# Patient Record
Sex: Female | Born: 1960 | Hispanic: No | Marital: Single | Smoking: Never smoker
Health system: Southern US, Community
[De-identification: ages and names within clinical notes are randomized; demographics above are authoritative.]

## PROBLEM LIST (undated history)

## (undated) DIAGNOSIS — C801 Malignant (primary) neoplasm, unspecified: Secondary | ICD-10-CM

## (undated) DIAGNOSIS — L719 Rosacea, unspecified: Secondary | ICD-10-CM

## (undated) DIAGNOSIS — I499 Cardiac arrhythmia, unspecified: Secondary | ICD-10-CM

## (undated) DIAGNOSIS — E079 Disorder of thyroid, unspecified: Secondary | ICD-10-CM

## (undated) DIAGNOSIS — Z8619 Personal history of other infectious and parasitic diseases: Secondary | ICD-10-CM

## (undated) DIAGNOSIS — F32A Depression, unspecified: Secondary | ICD-10-CM

## (undated) DIAGNOSIS — I10 Essential (primary) hypertension: Secondary | ICD-10-CM

## (undated) DIAGNOSIS — R011 Cardiac murmur, unspecified: Secondary | ICD-10-CM

## (undated) DIAGNOSIS — E7800 Pure hypercholesterolemia, unspecified: Secondary | ICD-10-CM

## (undated) DIAGNOSIS — F209 Schizophrenia, unspecified: Secondary | ICD-10-CM

## (undated) DIAGNOSIS — M1990 Unspecified osteoarthritis, unspecified site: Secondary | ICD-10-CM

## (undated) DIAGNOSIS — IMO0002 Reserved for concepts with insufficient information to code with codable children: Secondary | ICD-10-CM

## (undated) DIAGNOSIS — T7840XA Allergy, unspecified, initial encounter: Secondary | ICD-10-CM

## (undated) DIAGNOSIS — R002 Palpitations: Secondary | ICD-10-CM

## (undated) DIAGNOSIS — Z87898 Personal history of other specified conditions: Secondary | ICD-10-CM

## (undated) DIAGNOSIS — F419 Anxiety disorder, unspecified: Secondary | ICD-10-CM

## (undated) DIAGNOSIS — K219 Gastro-esophageal reflux disease without esophagitis: Secondary | ICD-10-CM

## (undated) HISTORY — DX: Schizophrenia, unspecified: F20.9

## (undated) HISTORY — DX: Cardiac arrhythmia, unspecified: I49.9

## (undated) HISTORY — DX: Disorder of thyroid, unspecified: E07.9

## (undated) HISTORY — DX: Unspecified osteoarthritis, unspecified site: M19.90

## (undated) HISTORY — DX: Personal history of other infectious and parasitic diseases: Z86.19

## (undated) HISTORY — DX: Rosacea, unspecified: L71.9

## (undated) HISTORY — DX: Palpitations: R00.2

## (undated) HISTORY — DX: Cardiac murmur, unspecified: R01.1

## (undated) HISTORY — PX: THYROIDECTOMY: SHX17

## (undated) HISTORY — DX: Allergy, unspecified, initial encounter: T78.40XA

## (undated) HISTORY — DX: Reserved for concepts with insufficient information to code with codable children: IMO0002

## (undated) HISTORY — DX: Malignant (primary) neoplasm, unspecified: C80.1

## (undated) HISTORY — DX: Pure hypercholesterolemia, unspecified: E78.00

## (undated) HISTORY — DX: Personal history of other specified conditions: Z87.898

## (undated) HISTORY — DX: Essential (primary) hypertension: I10

## (undated) HISTORY — DX: Depression, unspecified: F32.A

## (undated) HISTORY — DX: Gastro-esophageal reflux disease without esophagitis: K21.9

## (undated) HISTORY — PX: FOOT SURGERY: SHX648

## (undated) HISTORY — PX: KNEE SURGERY: SHX244

## (undated) HISTORY — DX: Anxiety disorder, unspecified: F41.9

## (undated) HISTORY — PX: BRAIN SURGERY: SHX531

---

## 2000-01-03 ENCOUNTER — Other Ambulatory Visit: Admission: RE | Admit: 2000-01-03 | Discharge: 2000-01-03 | Payer: Self-pay | Admitting: *Deleted

## 2001-01-09 ENCOUNTER — Other Ambulatory Visit: Admission: RE | Admit: 2001-01-09 | Discharge: 2001-01-09 | Payer: Self-pay | Admitting: *Deleted

## 2002-04-24 ENCOUNTER — Other Ambulatory Visit: Admission: RE | Admit: 2002-04-24 | Discharge: 2002-04-24 | Payer: Self-pay | Admitting: *Deleted

## 2002-04-26 ENCOUNTER — Emergency Department (HOSPITAL_COMMUNITY): Admission: EM | Admit: 2002-04-26 | Discharge: 2002-04-26 | Payer: Self-pay | Admitting: *Deleted

## 2002-04-26 ENCOUNTER — Encounter: Payer: Self-pay | Admitting: Emergency Medicine

## 2002-04-26 IMAGING — CT CT HEAD W/O CM
1 of 2 series · 13 of 30 positions shown, 17 images · non-contrast
Comparison: none

FINDINGS
CLINICAL DATA: FALL STRIKING BACK OF HEAD.   PAIN AND NAUSEA.
CT HEAD WITHOUT CONTRAST
ROUTINE NONCONTRAST CT OF THE HEAD WITHOUT PRIORS FOR COMPARISON.

[Series 2: — · axial · 0.43mm/px · z∈[+1011,+1131]mm · 13 of 28 slices shown, 17 images]
[im 2/28  brain]
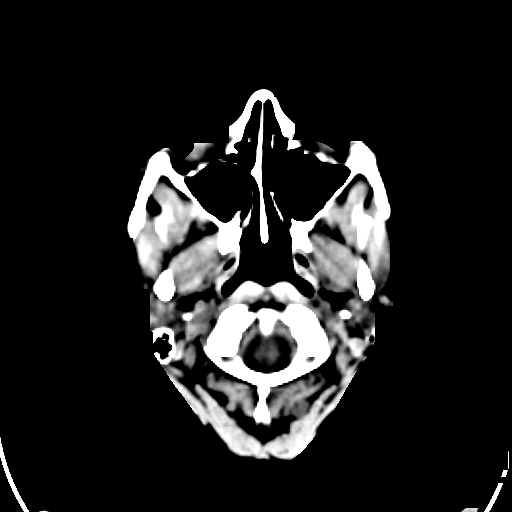
[im 2/28  bone]
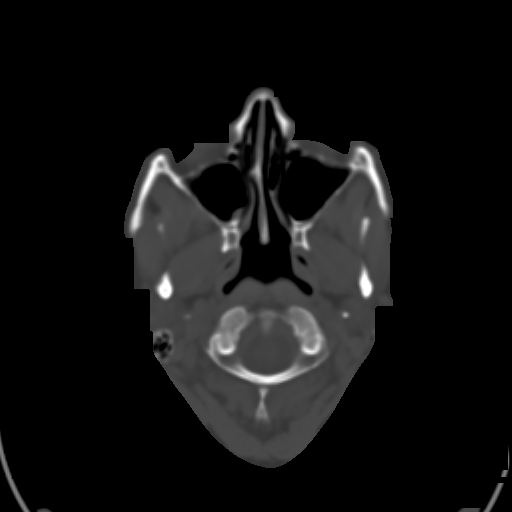
[im 4/28  brain]
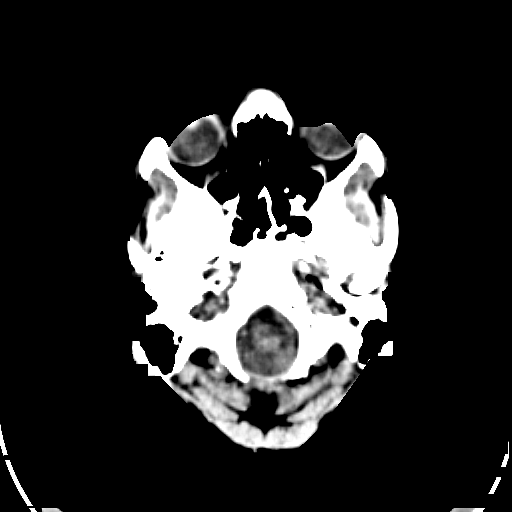
[im 6/28  brain]
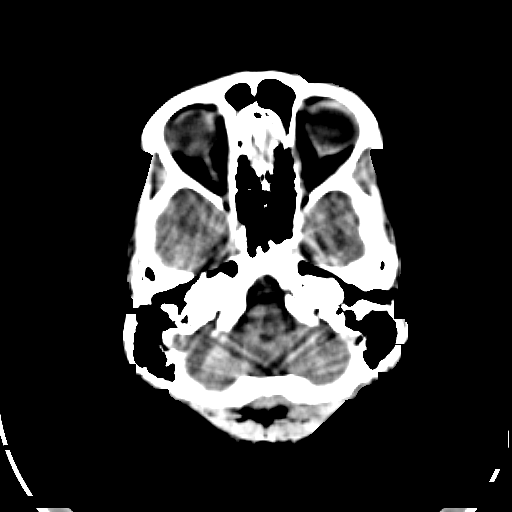
[im 8/28  brain]
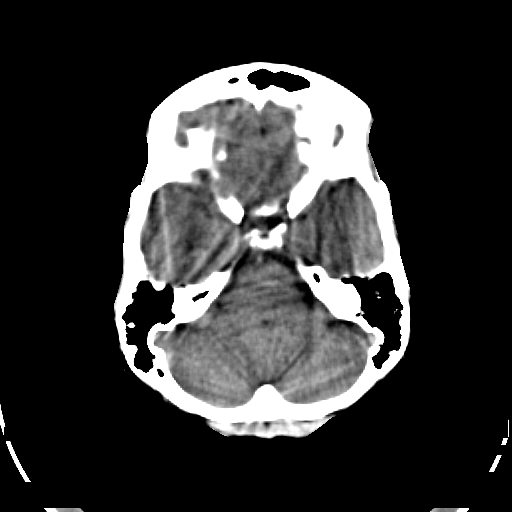
[im 10/28  brain]
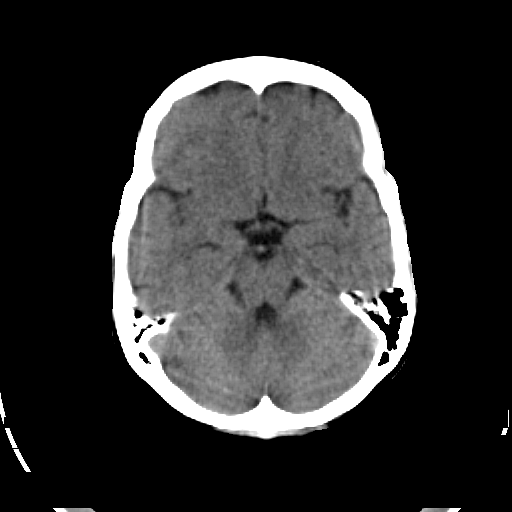
[im 10/28  bone]
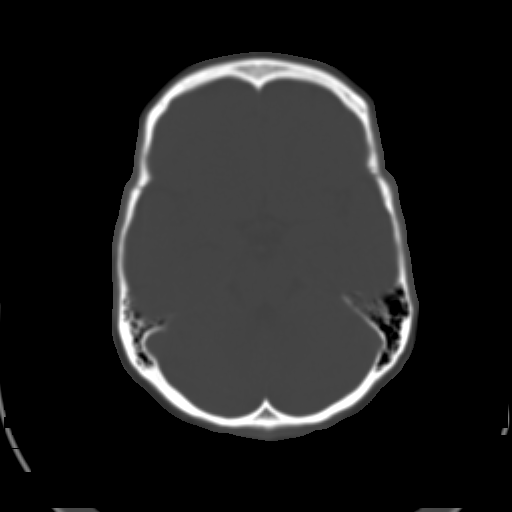
[im 12/28  brain]
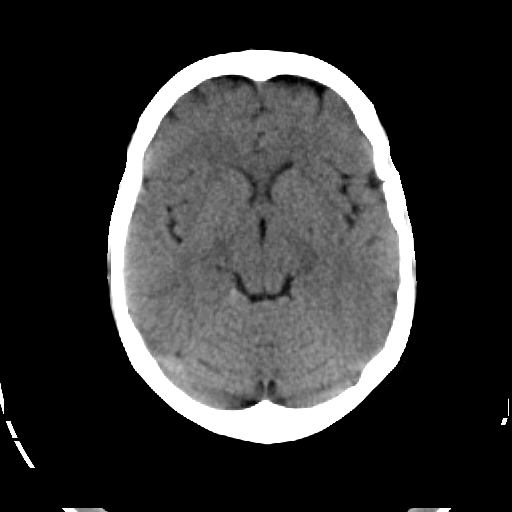
[im 14/28  brain]
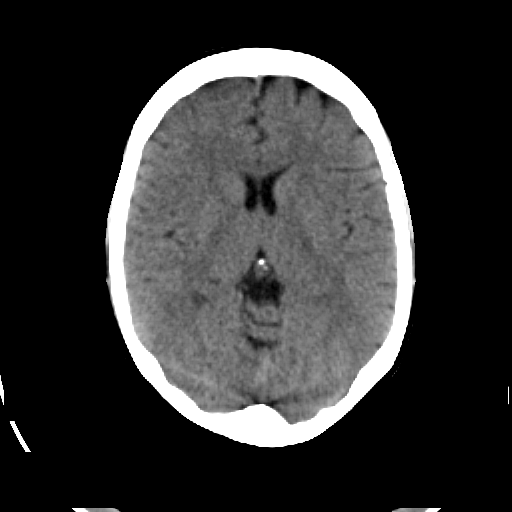
[im 16/28  brain]
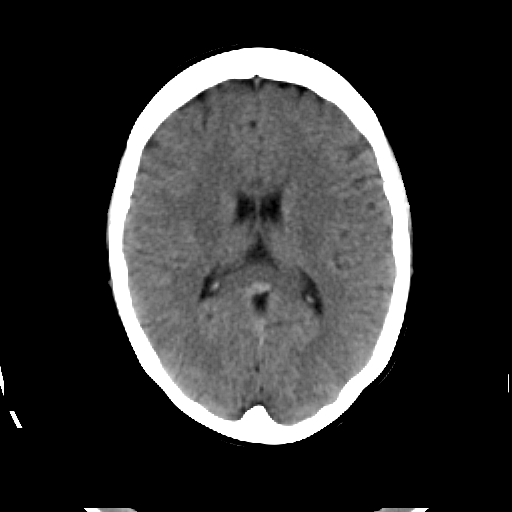
[im 18/28  brain]
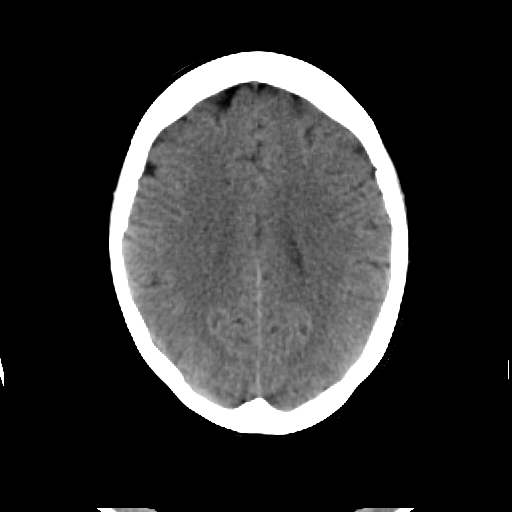
[im 18/28  bone]
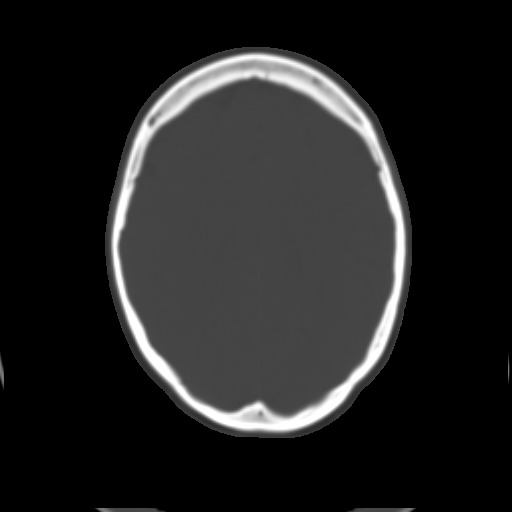
[im 20/28  brain]
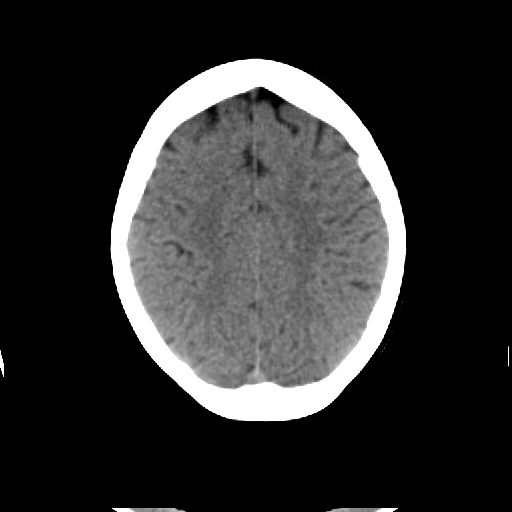
[im 22/28  brain]
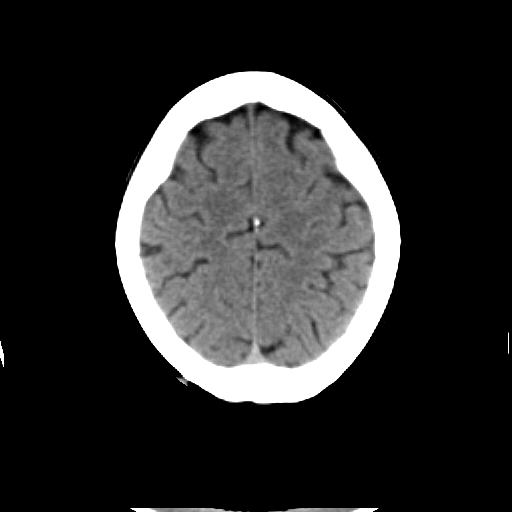
[im 24/28  brain]
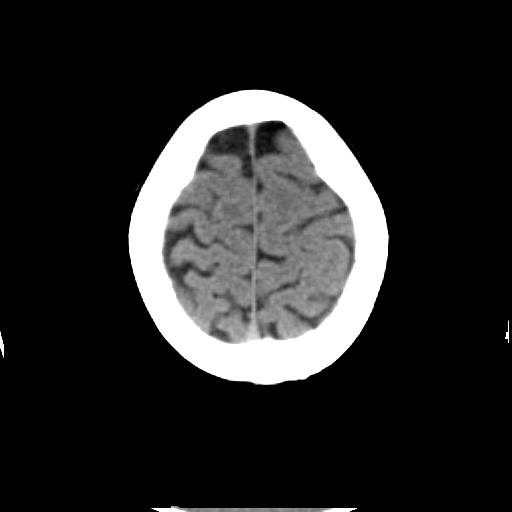
[im 26/28  brain]
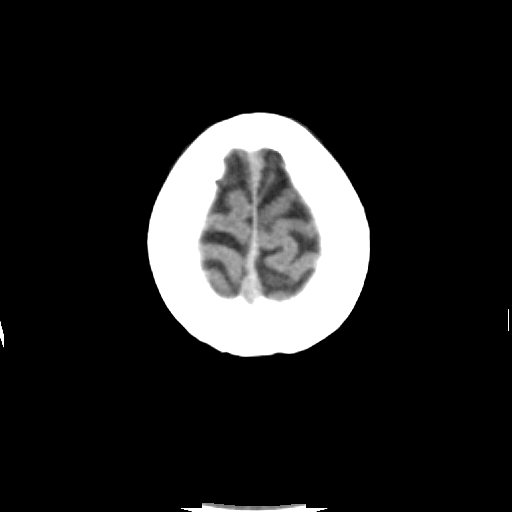
[im 26/28  bone]
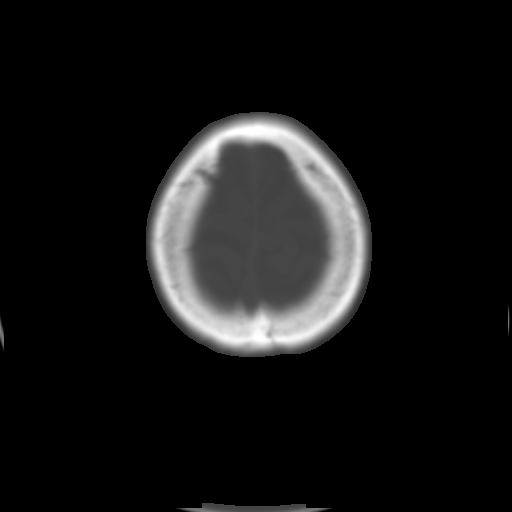

[13 of 30 positions shown; findings below may reference images not displayed]

FINDINGS: NORMAL VENTRICULAR MORPHOLOGY.  NO MIDLINE SHIFT OR MASS EFFECT.  NO INTRACRANIAL HEMORRHAGE OR
FOCAL PARENCHYMAL BRAIN ABNORMALITY SEEN.  SINUSES ARE CLEAR.  BONES ARE UNREMARKABLE.
IMPRESSION
NO EVIDENCE OF ACUTE INJURY.

## 2002-07-23 ENCOUNTER — Encounter: Payer: Self-pay | Admitting: Family Medicine

## 2002-07-23 ENCOUNTER — Encounter: Admission: RE | Admit: 2002-07-23 | Discharge: 2002-07-23 | Payer: Self-pay | Admitting: Family Medicine

## 2003-05-08 ENCOUNTER — Other Ambulatory Visit: Admission: RE | Admit: 2003-05-08 | Discharge: 2003-05-08 | Payer: Self-pay | Admitting: *Deleted

## 2005-02-19 ENCOUNTER — Emergency Department: Payer: Self-pay | Admitting: Emergency Medicine

## 2010-04-18 ENCOUNTER — Encounter: Payer: Self-pay | Admitting: Neurology

## 2011-04-11 DIAGNOSIS — R109 Unspecified abdominal pain: Secondary | ICD-10-CM | POA: Insufficient documentation

## 2011-04-11 DIAGNOSIS — L680 Hirsutism: Secondary | ICD-10-CM | POA: Insufficient documentation

## 2011-04-11 DIAGNOSIS — H9209 Otalgia, unspecified ear: Secondary | ICD-10-CM | POA: Insufficient documentation

## 2012-03-13 DIAGNOSIS — E039 Hypothyroidism, unspecified: Secondary | ICD-10-CM | POA: Insufficient documentation

## 2012-06-22 DIAGNOSIS — J069 Acute upper respiratory infection, unspecified: Secondary | ICD-10-CM | POA: Insufficient documentation

## 2012-09-26 DIAGNOSIS — Z7185 Encounter for immunization safety counseling: Secondary | ICD-10-CM | POA: Insufficient documentation

## 2012-09-26 DIAGNOSIS — IMO0001 Reserved for inherently not codable concepts without codable children: Secondary | ICD-10-CM | POA: Insufficient documentation

## 2013-04-17 DIAGNOSIS — L988 Other specified disorders of the skin and subcutaneous tissue: Secondary | ICD-10-CM | POA: Diagnosis not present

## 2013-04-17 DIAGNOSIS — E039 Hypothyroidism, unspecified: Secondary | ICD-10-CM | POA: Diagnosis not present

## 2013-04-17 DIAGNOSIS — E559 Vitamin D deficiency, unspecified: Secondary | ICD-10-CM | POA: Diagnosis not present

## 2013-04-29 DIAGNOSIS — Z8042 Family history of malignant neoplasm of prostate: Secondary | ICD-10-CM | POA: Diagnosis not present

## 2013-04-29 DIAGNOSIS — H538 Other visual disturbances: Secondary | ICD-10-CM | POA: Diagnosis not present

## 2013-04-29 DIAGNOSIS — B9789 Other viral agents as the cause of diseases classified elsewhere: Secondary | ICD-10-CM | POA: Diagnosis not present

## 2013-04-29 DIAGNOSIS — J3489 Other specified disorders of nose and nasal sinuses: Secondary | ICD-10-CM | POA: Diagnosis not present

## 2013-04-29 DIAGNOSIS — R059 Cough, unspecified: Secondary | ICD-10-CM | POA: Diagnosis not present

## 2013-04-29 DIAGNOSIS — E0789 Other specified disorders of thyroid: Secondary | ICD-10-CM | POA: Diagnosis not present

## 2013-04-29 DIAGNOSIS — Z8249 Family history of ischemic heart disease and other diseases of the circulatory system: Secondary | ICD-10-CM | POA: Diagnosis not present

## 2013-04-29 DIAGNOSIS — R110 Nausea: Secondary | ICD-10-CM | POA: Diagnosis not present

## 2013-04-29 DIAGNOSIS — R05 Cough: Secondary | ICD-10-CM | POA: Diagnosis not present

## 2013-04-29 DIAGNOSIS — Z800 Family history of malignant neoplasm of digestive organs: Secondary | ICD-10-CM | POA: Diagnosis not present

## 2013-04-29 DIAGNOSIS — T7589XA Other specified effects of external causes, initial encounter: Secondary | ICD-10-CM | POA: Diagnosis not present

## 2013-05-28 DIAGNOSIS — J3489 Other specified disorders of nose and nasal sinuses: Secondary | ICD-10-CM | POA: Diagnosis not present

## 2013-05-28 DIAGNOSIS — L680 Hirsutism: Secondary | ICD-10-CM | POA: Diagnosis not present

## 2013-05-28 DIAGNOSIS — L678 Other hair color and hair shaft abnormalities: Secondary | ICD-10-CM | POA: Insufficient documentation

## 2013-05-28 DIAGNOSIS — L670 Trichorrhexis nodosa: Secondary | ICD-10-CM | POA: Insufficient documentation

## 2013-05-28 DIAGNOSIS — R0981 Nasal congestion: Secondary | ICD-10-CM | POA: Insufficient documentation

## 2013-07-10 DIAGNOSIS — R079 Chest pain, unspecified: Secondary | ICD-10-CM | POA: Diagnosis not present

## 2013-07-10 DIAGNOSIS — C73 Malignant neoplasm of thyroid gland: Secondary | ICD-10-CM | POA: Diagnosis not present

## 2013-07-10 DIAGNOSIS — R002 Palpitations: Secondary | ICD-10-CM | POA: Insufficient documentation

## 2013-07-10 DIAGNOSIS — R0602 Shortness of breath: Secondary | ICD-10-CM | POA: Insufficient documentation

## 2013-07-10 DIAGNOSIS — I10 Essential (primary) hypertension: Secondary | ICD-10-CM | POA: Diagnosis not present

## 2013-07-18 DIAGNOSIS — R221 Localized swelling, mass and lump, neck: Secondary | ICD-10-CM | POA: Diagnosis not present

## 2013-07-18 DIAGNOSIS — C73 Malignant neoplasm of thyroid gland: Secondary | ICD-10-CM | POA: Diagnosis not present

## 2013-07-18 DIAGNOSIS — Z85850 Personal history of malignant neoplasm of thyroid: Secondary | ICD-10-CM | POA: Diagnosis not present

## 2013-07-18 DIAGNOSIS — R220 Localized swelling, mass and lump, head: Secondary | ICD-10-CM | POA: Diagnosis not present

## 2013-07-22 DIAGNOSIS — R079 Chest pain, unspecified: Secondary | ICD-10-CM | POA: Diagnosis not present

## 2013-07-30 DIAGNOSIS — M2669 Other specified disorders of temporomandibular joint: Secondary | ICD-10-CM | POA: Diagnosis not present

## 2013-07-30 DIAGNOSIS — F439 Reaction to severe stress, unspecified: Secondary | ICD-10-CM | POA: Insufficient documentation

## 2013-07-30 DIAGNOSIS — M26629 Arthralgia of temporomandibular joint, unspecified side: Secondary | ICD-10-CM | POA: Insufficient documentation

## 2013-07-30 DIAGNOSIS — Z733 Stress, not elsewhere classified: Secondary | ICD-10-CM | POA: Diagnosis not present

## 2013-08-02 DIAGNOSIS — M26609 Unspecified temporomandibular joint disorder, unspecified side: Secondary | ICD-10-CM | POA: Diagnosis not present

## 2013-08-20 DIAGNOSIS — R030 Elevated blood-pressure reading, without diagnosis of hypertension: Secondary | ICD-10-CM | POA: Diagnosis not present

## 2013-08-20 DIAGNOSIS — C73 Malignant neoplasm of thyroid gland: Secondary | ICD-10-CM | POA: Diagnosis not present

## 2013-08-20 DIAGNOSIS — R002 Palpitations: Secondary | ICD-10-CM | POA: Diagnosis not present

## 2013-08-20 DIAGNOSIS — R0602 Shortness of breath: Secondary | ICD-10-CM | POA: Diagnosis not present

## 2013-08-26 DIAGNOSIS — R197 Diarrhea, unspecified: Secondary | ICD-10-CM | POA: Diagnosis not present

## 2013-08-29 DIAGNOSIS — R110 Nausea: Secondary | ICD-10-CM | POA: Diagnosis not present

## 2013-08-29 DIAGNOSIS — R109 Unspecified abdominal pain: Secondary | ICD-10-CM | POA: Diagnosis not present

## 2013-08-29 DIAGNOSIS — F411 Generalized anxiety disorder: Secondary | ICD-10-CM | POA: Diagnosis not present

## 2013-09-10 DIAGNOSIS — F29 Unspecified psychosis not due to a substance or known physiological condition: Secondary | ICD-10-CM | POA: Diagnosis not present

## 2013-09-16 DIAGNOSIS — R21 Rash and other nonspecific skin eruption: Secondary | ICD-10-CM | POA: Diagnosis not present

## 2013-09-24 DIAGNOSIS — L719 Rosacea, unspecified: Secondary | ICD-10-CM | POA: Diagnosis not present

## 2013-09-24 DIAGNOSIS — D2330 Other benign neoplasm of skin of unspecified part of face: Secondary | ICD-10-CM | POA: Diagnosis not present

## 2013-10-07 DIAGNOSIS — L708 Other acne: Secondary | ICD-10-CM | POA: Diagnosis not present

## 2013-10-07 DIAGNOSIS — E039 Hypothyroidism, unspecified: Secondary | ICD-10-CM | POA: Diagnosis not present

## 2013-10-07 DIAGNOSIS — M25559 Pain in unspecified hip: Secondary | ICD-10-CM | POA: Diagnosis not present

## 2013-10-07 DIAGNOSIS — F411 Generalized anxiety disorder: Secondary | ICD-10-CM | POA: Diagnosis not present

## 2013-10-08 DIAGNOSIS — M25559 Pain in unspecified hip: Secondary | ICD-10-CM | POA: Diagnosis not present

## 2013-10-30 DIAGNOSIS — M79609 Pain in unspecified limb: Secondary | ICD-10-CM | POA: Diagnosis not present

## 2013-10-30 DIAGNOSIS — M79651 Pain in right thigh: Secondary | ICD-10-CM | POA: Insufficient documentation

## 2013-12-04 DIAGNOSIS — R109 Unspecified abdominal pain: Secondary | ICD-10-CM | POA: Diagnosis not present

## 2013-12-04 DIAGNOSIS — R51 Headache: Secondary | ICD-10-CM | POA: Diagnosis not present

## 2013-12-04 DIAGNOSIS — R110 Nausea: Secondary | ICD-10-CM | POA: Diagnosis not present

## 2013-12-24 DIAGNOSIS — L259 Unspecified contact dermatitis, unspecified cause: Secondary | ICD-10-CM | POA: Diagnosis not present

## 2014-01-03 DIAGNOSIS — Z79899 Other long term (current) drug therapy: Secondary | ICD-10-CM | POA: Diagnosis not present

## 2014-01-03 DIAGNOSIS — I10 Essential (primary) hypertension: Secondary | ICD-10-CM | POA: Diagnosis not present

## 2014-01-03 DIAGNOSIS — L299 Pruritus, unspecified: Secondary | ICD-10-CM | POA: Diagnosis not present

## 2014-01-03 DIAGNOSIS — E039 Hypothyroidism, unspecified: Secondary | ICD-10-CM | POA: Diagnosis not present

## 2014-01-03 DIAGNOSIS — F329 Major depressive disorder, single episode, unspecified: Secondary | ICD-10-CM | POA: Diagnosis not present

## 2014-01-03 DIAGNOSIS — F419 Anxiety disorder, unspecified: Secondary | ICD-10-CM | POA: Diagnosis not present

## 2014-01-03 DIAGNOSIS — F439 Reaction to severe stress, unspecified: Secondary | ICD-10-CM | POA: Diagnosis not present

## 2014-01-03 DIAGNOSIS — R443 Hallucinations, unspecified: Secondary | ICD-10-CM | POA: Diagnosis not present

## 2014-01-10 DIAGNOSIS — L680 Hirsutism: Secondary | ICD-10-CM | POA: Diagnosis not present

## 2014-01-28 DIAGNOSIS — H524 Presbyopia: Secondary | ICD-10-CM | POA: Diagnosis not present

## 2014-01-28 DIAGNOSIS — H5213 Myopia, bilateral: Secondary | ICD-10-CM | POA: Diagnosis not present

## 2014-01-28 DIAGNOSIS — H04123 Dry eye syndrome of bilateral lacrimal glands: Secondary | ICD-10-CM | POA: Diagnosis not present

## 2014-02-07 DIAGNOSIS — R443 Hallucinations, unspecified: Secondary | ICD-10-CM | POA: Diagnosis not present

## 2014-02-07 DIAGNOSIS — J00 Acute nasopharyngitis [common cold]: Secondary | ICD-10-CM | POA: Diagnosis not present

## 2014-02-12 DIAGNOSIS — J0191 Acute recurrent sinusitis, unspecified: Secondary | ICD-10-CM | POA: Diagnosis not present

## 2014-02-12 DIAGNOSIS — F419 Anxiety disorder, unspecified: Secondary | ICD-10-CM | POA: Diagnosis not present

## 2014-02-12 DIAGNOSIS — E039 Hypothyroidism, unspecified: Secondary | ICD-10-CM | POA: Diagnosis not present

## 2014-02-12 DIAGNOSIS — F329 Major depressive disorder, single episode, unspecified: Secondary | ICD-10-CM | POA: Diagnosis not present

## 2014-02-12 DIAGNOSIS — J40 Bronchitis, not specified as acute or chronic: Secondary | ICD-10-CM | POA: Diagnosis not present

## 2014-02-12 DIAGNOSIS — Z85850 Personal history of malignant neoplasm of thyroid: Secondary | ICD-10-CM | POA: Diagnosis not present

## 2014-02-18 DIAGNOSIS — J0100 Acute maxillary sinusitis, unspecified: Secondary | ICD-10-CM | POA: Diagnosis not present

## 2014-03-05 DIAGNOSIS — R42 Dizziness and giddiness: Secondary | ICD-10-CM | POA: Diagnosis not present

## 2014-03-05 DIAGNOSIS — R51 Headache: Secondary | ICD-10-CM | POA: Diagnosis not present

## 2014-03-22 DIAGNOSIS — I10 Essential (primary) hypertension: Secondary | ICD-10-CM | POA: Diagnosis not present

## 2014-04-03 DIAGNOSIS — Z01419 Encounter for gynecological examination (general) (routine) without abnormal findings: Secondary | ICD-10-CM | POA: Diagnosis not present

## 2014-04-03 DIAGNOSIS — Z1212 Encounter for screening for malignant neoplasm of rectum: Secondary | ICD-10-CM | POA: Diagnosis not present

## 2014-04-03 DIAGNOSIS — Z0000 Encounter for general adult medical examination without abnormal findings: Secondary | ICD-10-CM | POA: Insufficient documentation

## 2014-04-14 DIAGNOSIS — R030 Elevated blood-pressure reading, without diagnosis of hypertension: Secondary | ICD-10-CM | POA: Diagnosis not present

## 2014-04-14 DIAGNOSIS — F329 Major depressive disorder, single episode, unspecified: Secondary | ICD-10-CM | POA: Diagnosis not present

## 2014-04-14 DIAGNOSIS — M5030 Other cervical disc degeneration, unspecified cervical region: Secondary | ICD-10-CM | POA: Diagnosis not present

## 2014-04-14 DIAGNOSIS — F438 Other reactions to severe stress: Secondary | ICD-10-CM | POA: Diagnosis not present

## 2014-04-14 DIAGNOSIS — Z79899 Other long term (current) drug therapy: Secondary | ICD-10-CM | POA: Diagnosis not present

## 2014-04-14 DIAGNOSIS — F419 Anxiety disorder, unspecified: Secondary | ICD-10-CM | POA: Diagnosis not present

## 2014-04-14 DIAGNOSIS — E039 Hypothyroidism, unspecified: Secondary | ICD-10-CM | POA: Diagnosis not present

## 2014-05-03 DIAGNOSIS — H578 Other specified disorders of eye and adnexa: Secondary | ICD-10-CM | POA: Diagnosis not present

## 2014-05-03 DIAGNOSIS — Z884 Allergy status to anesthetic agent status: Secondary | ICD-10-CM | POA: Diagnosis not present

## 2014-05-03 DIAGNOSIS — H5711 Ocular pain, right eye: Secondary | ICD-10-CM | POA: Diagnosis not present

## 2014-05-23 DIAGNOSIS — F3189 Other bipolar disorder: Secondary | ICD-10-CM | POA: Diagnosis not present

## 2014-05-23 DIAGNOSIS — H44003 Unspecified purulent endophthalmitis, bilateral: Secondary | ICD-10-CM | POA: Diagnosis present

## 2014-05-23 DIAGNOSIS — F329 Major depressive disorder, single episode, unspecified: Secondary | ICD-10-CM | POA: Diagnosis present

## 2014-05-23 DIAGNOSIS — E890 Postprocedural hypothyroidism: Secondary | ICD-10-CM | POA: Diagnosis not present

## 2014-05-23 DIAGNOSIS — F319 Bipolar disorder, unspecified: Secondary | ICD-10-CM | POA: Diagnosis not present

## 2014-05-23 DIAGNOSIS — F250 Schizoaffective disorder, bipolar type: Secondary | ICD-10-CM | POA: Diagnosis not present

## 2014-05-23 DIAGNOSIS — Z886 Allergy status to analgesic agent status: Secondary | ICD-10-CM | POA: Diagnosis not present

## 2014-05-23 DIAGNOSIS — E038 Other specified hypothyroidism: Secondary | ICD-10-CM | POA: Diagnosis not present

## 2014-05-23 DIAGNOSIS — F200 Paranoid schizophrenia: Secondary | ICD-10-CM | POA: Diagnosis not present

## 2014-05-23 DIAGNOSIS — A5430 Gonococcal infection of eye, unspecified: Secondary | ICD-10-CM | POA: Diagnosis not present

## 2014-05-23 DIAGNOSIS — Z9009 Acquired absence of other part of head and neck: Secondary | ICD-10-CM | POA: Insufficient documentation

## 2014-05-23 DIAGNOSIS — Z6821 Body mass index (BMI) 21.0-21.9, adult: Secondary | ICD-10-CM | POA: Diagnosis not present

## 2014-05-23 DIAGNOSIS — M5020 Other cervical disc displacement, unspecified cervical region: Secondary | ICD-10-CM | POA: Diagnosis present

## 2014-05-23 DIAGNOSIS — F29 Unspecified psychosis not due to a substance or known physiological condition: Secondary | ICD-10-CM | POA: Diagnosis not present

## 2014-05-23 DIAGNOSIS — E039 Hypothyroidism, unspecified: Secondary | ICD-10-CM | POA: Diagnosis present

## 2014-05-23 DIAGNOSIS — Z7951 Long term (current) use of inhaled steroids: Secondary | ICD-10-CM | POA: Diagnosis not present

## 2014-05-23 DIAGNOSIS — Z79899 Other long term (current) drug therapy: Secondary | ICD-10-CM | POA: Diagnosis not present

## 2014-06-16 DIAGNOSIS — J302 Other seasonal allergic rhinitis: Secondary | ICD-10-CM | POA: Diagnosis not present

## 2014-06-19 DIAGNOSIS — Z590 Homelessness: Secondary | ICD-10-CM | POA: Diagnosis not present

## 2014-06-19 DIAGNOSIS — F413 Other mixed anxiety disorders: Secondary | ICD-10-CM | POA: Diagnosis not present

## 2014-06-19 DIAGNOSIS — Z596 Low income: Secondary | ICD-10-CM | POA: Diagnosis not present

## 2014-06-19 DIAGNOSIS — F200 Paranoid schizophrenia: Secondary | ICD-10-CM | POA: Diagnosis not present

## 2014-07-03 DIAGNOSIS — F413 Other mixed anxiety disorders: Secondary | ICD-10-CM | POA: Diagnosis not present

## 2014-07-03 DIAGNOSIS — Z590 Homelessness: Secondary | ICD-10-CM | POA: Diagnosis not present

## 2014-07-03 DIAGNOSIS — F200 Paranoid schizophrenia: Secondary | ICD-10-CM | POA: Diagnosis not present

## 2014-07-03 DIAGNOSIS — Z596 Low income: Secondary | ICD-10-CM | POA: Diagnosis not present

## 2014-07-16 DIAGNOSIS — F413 Other mixed anxiety disorders: Secondary | ICD-10-CM | POA: Diagnosis not present

## 2014-07-16 DIAGNOSIS — Z590 Homelessness: Secondary | ICD-10-CM | POA: Diagnosis not present

## 2014-07-16 DIAGNOSIS — F200 Paranoid schizophrenia: Secondary | ICD-10-CM | POA: Diagnosis not present

## 2014-07-16 DIAGNOSIS — Z596 Low income: Secondary | ICD-10-CM | POA: Diagnosis not present

## 2014-07-21 DIAGNOSIS — E890 Postprocedural hypothyroidism: Secondary | ICD-10-CM | POA: Diagnosis not present

## 2014-07-30 DIAGNOSIS — Z596 Low income: Secondary | ICD-10-CM | POA: Diagnosis not present

## 2014-07-30 DIAGNOSIS — F413 Other mixed anxiety disorders: Secondary | ICD-10-CM | POA: Diagnosis not present

## 2014-07-30 DIAGNOSIS — F200 Paranoid schizophrenia: Secondary | ICD-10-CM | POA: Diagnosis not present

## 2014-07-30 DIAGNOSIS — Z590 Homelessness: Secondary | ICD-10-CM | POA: Diagnosis not present

## 2014-08-19 DIAGNOSIS — F413 Other mixed anxiety disorders: Secondary | ICD-10-CM | POA: Diagnosis not present

## 2014-08-19 DIAGNOSIS — Z596 Low income: Secondary | ICD-10-CM | POA: Diagnosis not present

## 2014-08-19 DIAGNOSIS — F200 Paranoid schizophrenia: Secondary | ICD-10-CM | POA: Diagnosis not present

## 2014-08-19 DIAGNOSIS — Z590 Homelessness: Secondary | ICD-10-CM | POA: Diagnosis not present

## 2014-08-22 DIAGNOSIS — J019 Acute sinusitis, unspecified: Secondary | ICD-10-CM | POA: Diagnosis not present

## 2014-08-23 DIAGNOSIS — Z800 Family history of malignant neoplasm of digestive organs: Secondary | ICD-10-CM | POA: Diagnosis not present

## 2014-08-23 DIAGNOSIS — Z808 Family history of malignant neoplasm of other organs or systems: Secondary | ICD-10-CM | POA: Diagnosis not present

## 2014-08-23 DIAGNOSIS — R0981 Nasal congestion: Secondary | ICD-10-CM | POA: Diagnosis not present

## 2014-08-23 DIAGNOSIS — Z79899 Other long term (current) drug therapy: Secondary | ICD-10-CM | POA: Diagnosis not present

## 2014-08-23 DIAGNOSIS — F329 Major depressive disorder, single episode, unspecified: Secondary | ICD-10-CM | POA: Diagnosis not present

## 2014-08-23 DIAGNOSIS — E890 Postprocedural hypothyroidism: Secondary | ICD-10-CM | POA: Diagnosis not present

## 2014-08-23 DIAGNOSIS — Z8249 Family history of ischemic heart disease and other diseases of the circulatory system: Secondary | ICD-10-CM | POA: Diagnosis not present

## 2014-08-23 DIAGNOSIS — R42 Dizziness and giddiness: Secondary | ICD-10-CM | POA: Diagnosis not present

## 2014-08-23 DIAGNOSIS — I10 Essential (primary) hypertension: Secondary | ICD-10-CM | POA: Diagnosis not present

## 2014-08-23 DIAGNOSIS — F419 Anxiety disorder, unspecified: Secondary | ICD-10-CM | POA: Diagnosis not present

## 2014-08-23 DIAGNOSIS — Z886 Allergy status to analgesic agent status: Secondary | ICD-10-CM | POA: Diagnosis not present

## 2014-08-27 DIAGNOSIS — R51 Headache: Secondary | ICD-10-CM | POA: Diagnosis not present

## 2014-08-27 DIAGNOSIS — R05 Cough: Secondary | ICD-10-CM | POA: Diagnosis not present

## 2014-08-31 DIAGNOSIS — Z85850 Personal history of malignant neoplasm of thyroid: Secondary | ICD-10-CM | POA: Diagnosis not present

## 2014-08-31 DIAGNOSIS — F419 Anxiety disorder, unspecified: Secondary | ICD-10-CM | POA: Diagnosis not present

## 2014-08-31 DIAGNOSIS — Z590 Homelessness: Secondary | ICD-10-CM | POA: Diagnosis not present

## 2014-08-31 DIAGNOSIS — E038 Other specified hypothyroidism: Secondary | ICD-10-CM | POA: Diagnosis not present

## 2014-08-31 DIAGNOSIS — I10 Essential (primary) hypertension: Secondary | ICD-10-CM | POA: Diagnosis not present

## 2014-08-31 DIAGNOSIS — F329 Major depressive disorder, single episode, unspecified: Secondary | ICD-10-CM | POA: Diagnosis not present

## 2014-08-31 DIAGNOSIS — Z79899 Other long term (current) drug therapy: Secondary | ICD-10-CM | POA: Diagnosis not present

## 2014-08-31 DIAGNOSIS — R531 Weakness: Secondary | ICD-10-CM | POA: Diagnosis not present

## 2014-08-31 DIAGNOSIS — N390 Urinary tract infection, site not specified: Secondary | ICD-10-CM | POA: Diagnosis not present

## 2014-09-04 DIAGNOSIS — F200 Paranoid schizophrenia: Secondary | ICD-10-CM | POA: Diagnosis not present

## 2014-09-04 DIAGNOSIS — Z596 Low income: Secondary | ICD-10-CM | POA: Diagnosis not present

## 2014-09-04 DIAGNOSIS — Z590 Homelessness: Secondary | ICD-10-CM | POA: Diagnosis not present

## 2014-09-04 DIAGNOSIS — F413 Other mixed anxiety disorders: Secondary | ICD-10-CM | POA: Diagnosis not present

## 2014-09-05 DIAGNOSIS — C73 Malignant neoplasm of thyroid gland: Secondary | ICD-10-CM | POA: Diagnosis not present

## 2014-09-05 DIAGNOSIS — R8290 Unspecified abnormal findings in urine: Secondary | ICD-10-CM | POA: Diagnosis not present

## 2014-10-02 DIAGNOSIS — L718 Other rosacea: Secondary | ICD-10-CM | POA: Diagnosis not present

## 2014-10-02 DIAGNOSIS — R52 Pain, unspecified: Secondary | ICD-10-CM | POA: Diagnosis not present

## 2014-10-02 DIAGNOSIS — L821 Other seborrheic keratosis: Secondary | ICD-10-CM | POA: Diagnosis not present

## 2014-11-14 ENCOUNTER — Ambulatory Visit (INDEPENDENT_AMBULATORY_CARE_PROVIDER_SITE_OTHER): Payer: Medicare Other | Admitting: Physician Assistant

## 2014-11-14 ENCOUNTER — Encounter: Payer: Self-pay | Admitting: Physician Assistant

## 2014-11-14 VITALS — BP 108/70 | HR 68 | Temp 97.9°F | Resp 16 | Ht 65.0 in | Wt 102.0 lb

## 2014-11-14 DIAGNOSIS — E890 Postprocedural hypothyroidism: Secondary | ICD-10-CM | POA: Diagnosis not present

## 2014-11-14 DIAGNOSIS — R300 Dysuria: Secondary | ICD-10-CM | POA: Diagnosis not present

## 2014-11-14 DIAGNOSIS — Z7689 Persons encountering health services in other specified circumstances: Secondary | ICD-10-CM

## 2014-11-14 DIAGNOSIS — Z7189 Other specified counseling: Secondary | ICD-10-CM

## 2014-11-14 LAB — POCT URINALYSIS DIPSTICK
Bilirubin, UA: NEGATIVE
Blood, UA: NEGATIVE
Glucose, UA: NEGATIVE
Ketones, UA: NEGATIVE
Leukocytes, UA: NEGATIVE
Nitrite, UA: NEGATIVE
Protein, UA: NEGATIVE
Spec Grav, UA: <=1.005
Urobilinogen, UA: 0.2
pH, UA: 7

## 2014-11-14 IMAGING — US US ABDOMEN COMPLETE
2 series · 14 of 25 positions shown · non-contrast
Comparison: No recent prior.

CLINICAL DATA: Abdominal pain.

EXAM:
ABDOMEN ULTRASOUND COMPLETE

[Series 1: us abdomen complete · 0.17mm/px · 13 of 96 slices shown (1 of 2)]
[im 1/96]
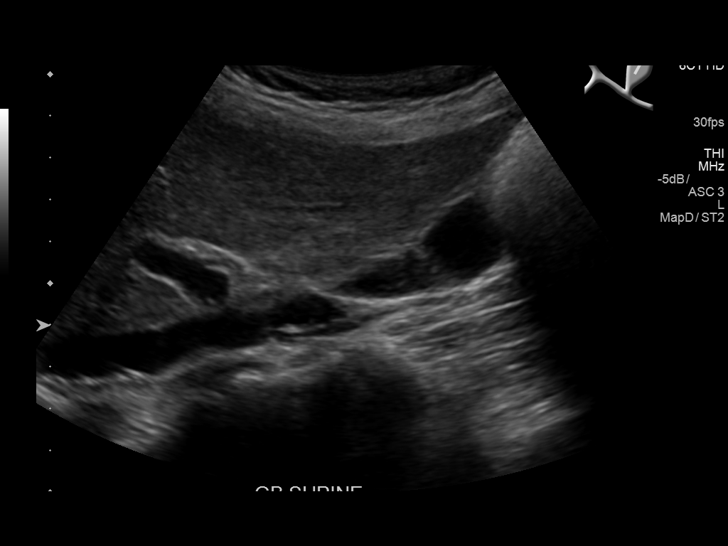
[im 9/96]
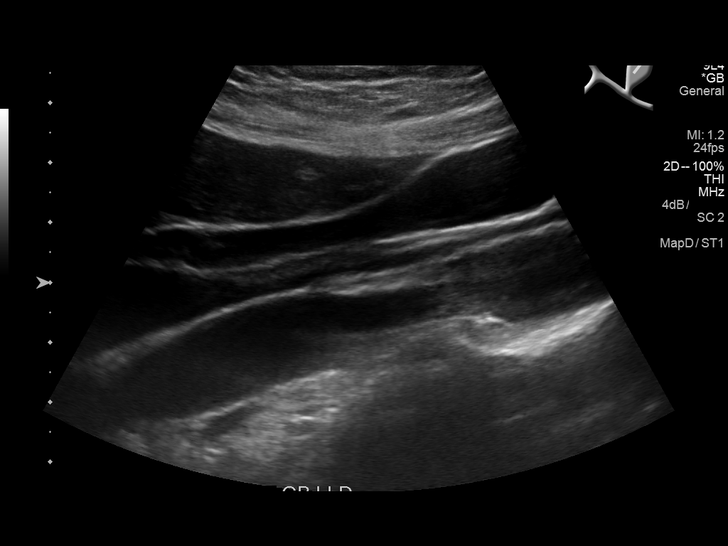
[im 17/96]
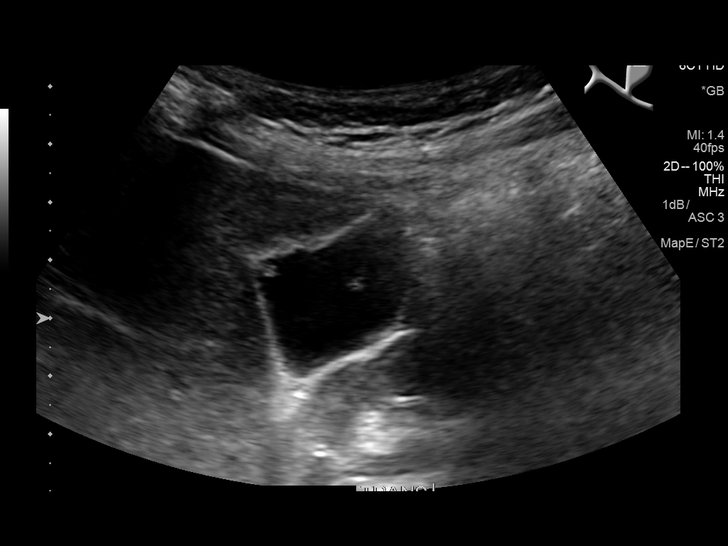
[im 25/96]
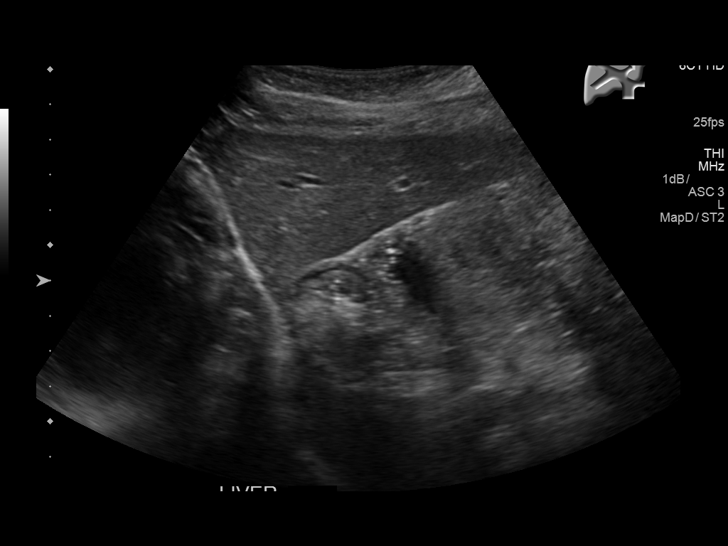
[im 34/96]
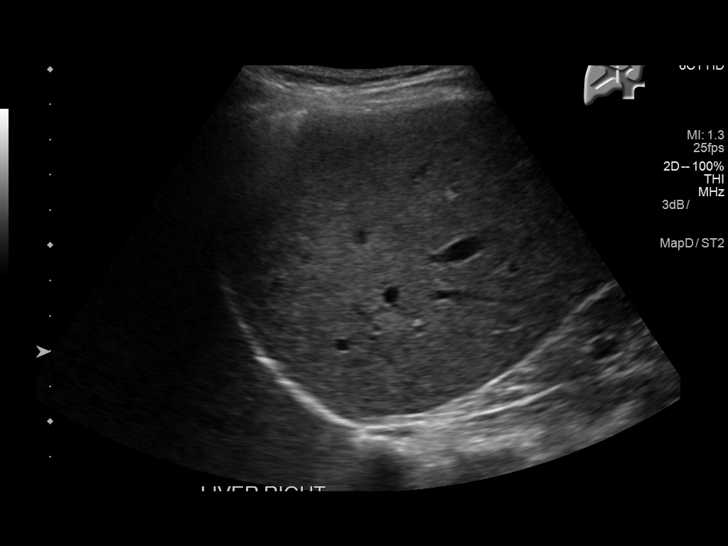
[im 38/96]
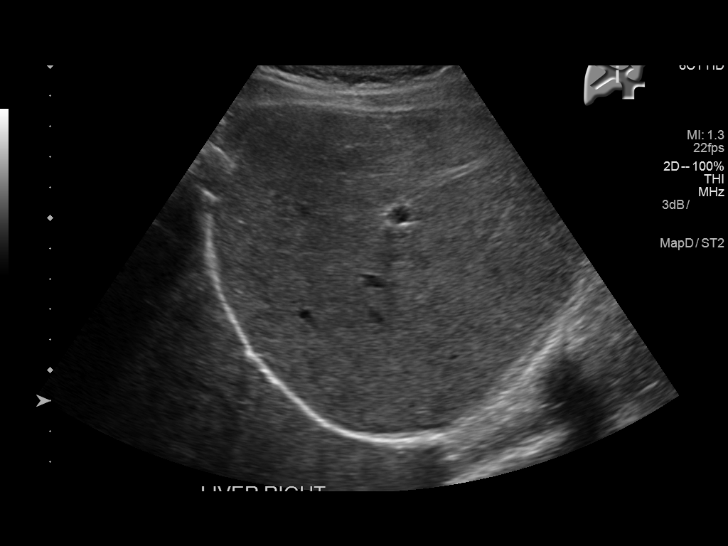
[im 46/96]
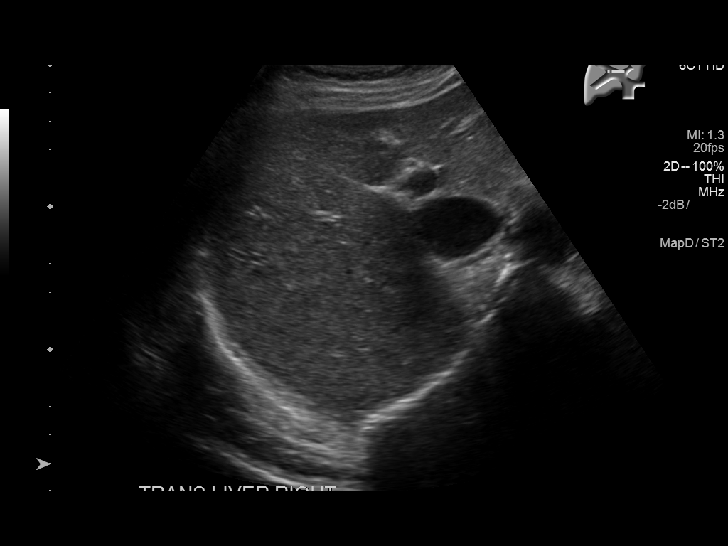
[im 54/96]
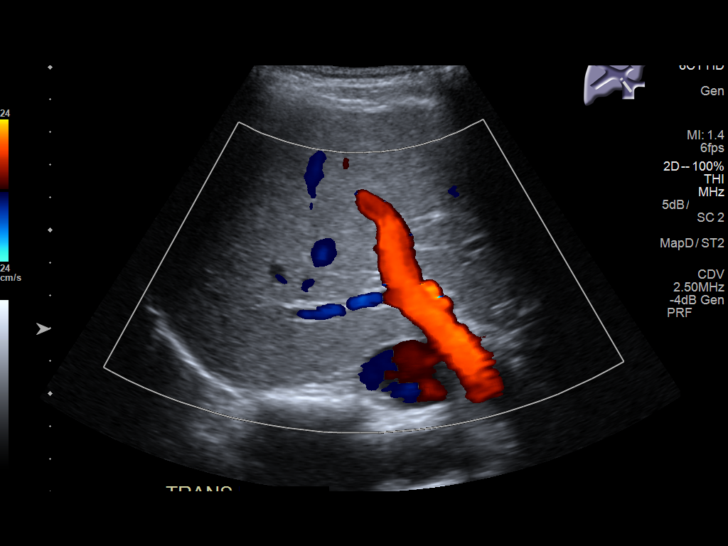
[im 62/96]
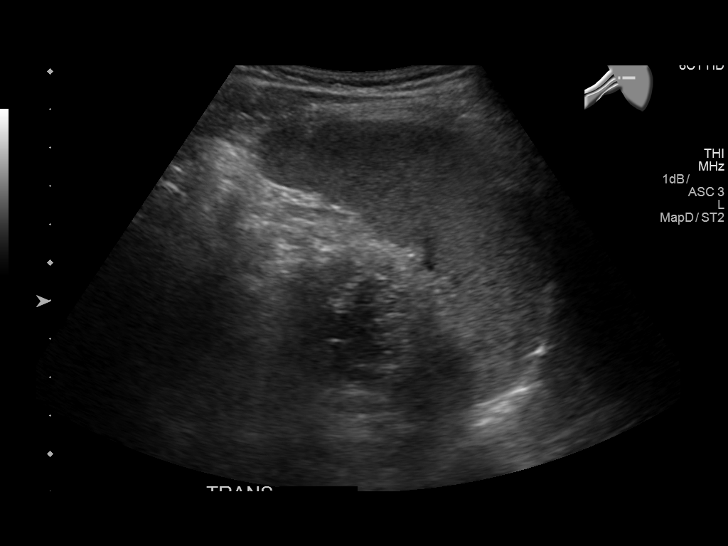
[im 67/96]
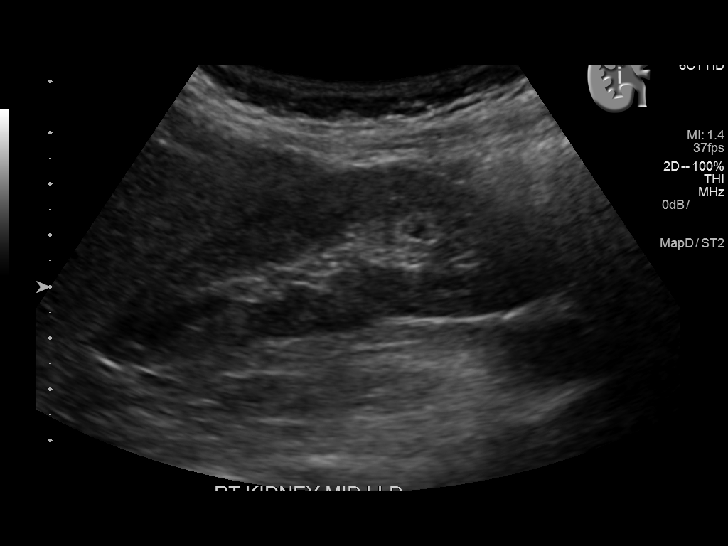
[im 75/96]
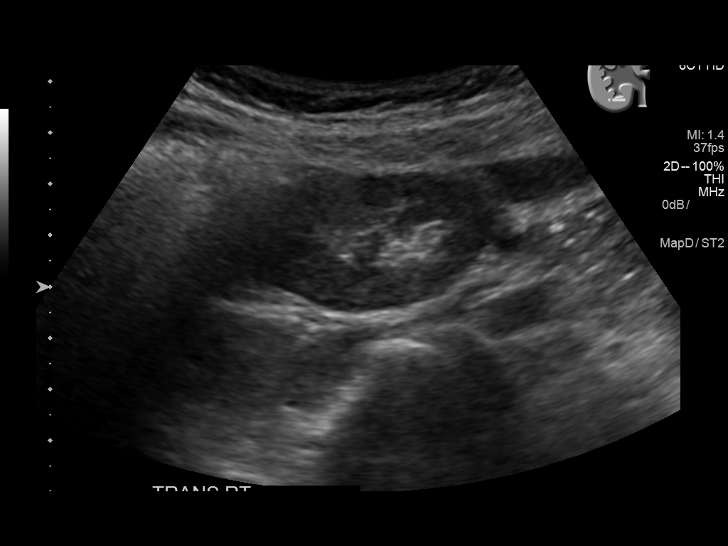
[im 83/96]
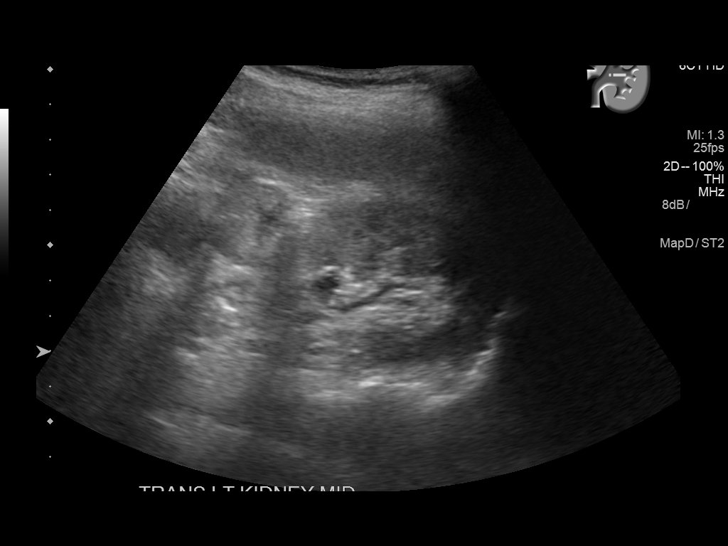
[im 91/96]
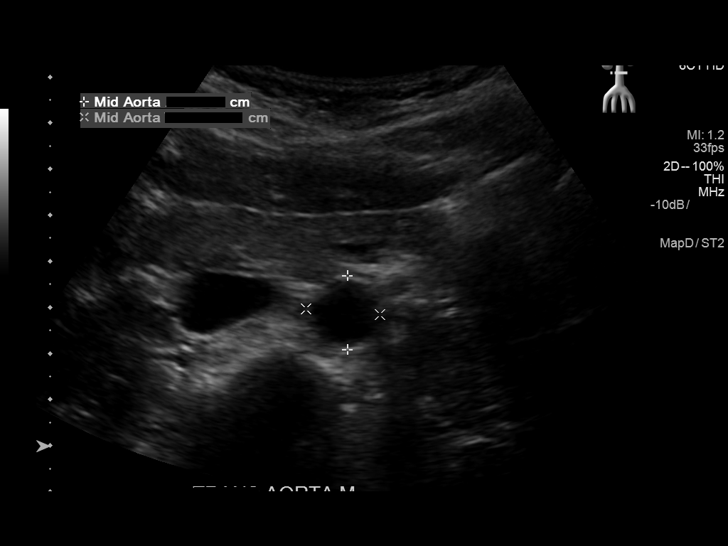

[Series 2001: us abdomen complete · 0.17mm/px · 1 of 2 slices shown (2 of 2)]
[im 1/2]
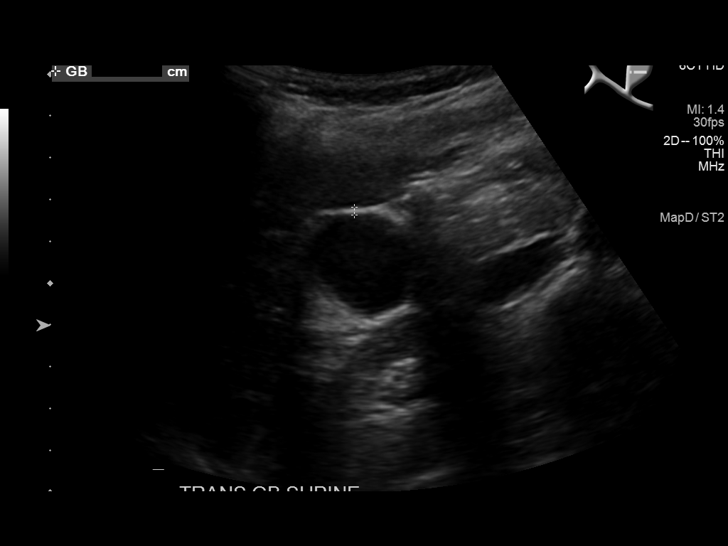

[14 of 25 positions shown; findings below may reference images not displayed]

FINDINGS: Gallbladder: No gallstones. Multiple tiny polyps are noted. Largest
measures 4 mm. Gallbladder wall thickness 1.9 mm .

Common bile duct: Diameter: 2.3 mm

Liver: No focal lesion identified. Within normal limits in
parenchymal echogenicity.

IVC: No abnormality visualized.

Pancreas: Visualized portion unremarkable.

Spleen: Size and appearance within normal limits.

Right Kidney: Length: 9.6 cm. Echogenicity within normal limits. No
mass or hydronephrosis visualized.

Left Kidney: Length: 9.9 cm. Echogenicity within normal limits. No
mass or hydronephrosis visualized.

Abdominal aorta: No aneurysm visualized.

Other findings: None.
IMPRESSION: 1. Multiple tiny gallbladder polyps.

2.  Exam otherwise unremarkable .

## 2014-11-14 NOTE — Patient Instructions (Signed)

## 2014-11-14 NOTE — Progress Notes (Signed)
Patient ID: Diamond Lucas, female   DOB: [birth year], 54 y.o.   MRN: [id number]      Visit Date: 11/14/2014  Today's Provider: Mar Daring, PA-C   Chief Complaint  Patient presents with  . Establish Care  . Urinary Tract Infection    patient was seen in June and she wants to make sure this is cleared up  . Labs Only   Subjective:    HPI  Patient is here to get established.  She wants to have her urine rechecked from when she had a UTI in June 2016 to make sure it is cleared.  She is asymptomatic.  She also would like to have her thyroid checked for Dr. Tod Persia [facility]).  She states that he has told her it is ok for her to have labs checked elsewhere and he will adjust her dose as needed over the phone and see her annually.  She is currently taking levothyroxine 75 mcg 6 days per week and 1/2 tab on Sunday. Doctors she has seen: Dr. Posey Pronto in [place], Dr. Loel Ro [place]-endocrinologist. Also been seen at [facility] in [place].     Allergies  Allergen Reactions  . Diclofenac Sodium Palpitations  . Naproxen Itching, Rash and Hives  . Nsaids    Previous Medications   CYCLOSPORINE (RESTASIS) 0.05 % OPHTHALMIC EMULSION    1 drop.   LEVOTHYROXINE (SYNTHROID, LEVOTHROID) 75 MCG TABLET    TAKE 1 TABLET BY MOUTH DAILY   MOMETASONE (NASONEX) 50 MCG/ACT NASAL SPRAY    Place into the nose.   MULTIPLE VITAMINS-MINERALS (MULTIVITAMIN ADULT PO)    Take by mouth.   RISPERIDONE (RISPERDAL M-TABS) 2 MG DISINTEGRATING TABLET        Review of Systems  Constitutional: Positive for fatigue.  HENT: Positive for dental problem.   Eyes: Negative.        Nearsighted  Respiratory: Negative.   Cardiovascular: Negative.        Has varicose veins  Gastrointestinal: Positive for nausea.  Endocrine:       No thyroid gland present   Genitourinary: Negative.   Musculoskeletal: Positive for neck pain and neck stiffness.  Skin: Negative.   Allergic/Immunologic:  Positive for environmental allergies.  Neurological: Positive for weakness and light-headedness.  Hematological: Negative.   Psychiatric/Behavioral: Positive for confusion (sometimes).    Social History  Substance Use Topics  . Smoking status: Never Smoker   . Smokeless tobacco: Never Used  . Alcohol Use: No   Objective:   BP 108/70 mmHg  Pulse 68  Temp(Src) 97.9 F (36.6 C)  Resp 16  Ht 5\' 5"  (1.651 m)  Wt 102 lb (46.267 kg)  BMI 16.97 kg/m2  Physical Exam  Constitutional: She appears well-developed and well-nourished. No distress.  Cardiovascular: Normal rate, regular rhythm and normal heart sounds.  Exam reveals no gallop and no friction rub.   No murmur heard. Pulmonary/Chest: Effort normal and breath sounds normal. No respiratory distress. She has no wheezes. She has no rales.  Skin: She is not diaphoretic.  Vitals reviewed.       Assessment & Plan:     1. Encounter to establish care  2. Dysuria Recheck urine to make sure she had cleared a UTI she had in June 2016 and was treated for at [facility]. UA was clear today. - POCT Urinalysis Dipstick  3. Hypothyroidism, postop  she is normally followed by Dr. Lonna Duval at [facility] endocr[facility]ology. She most recently was seen in  April 2016. He adjusted her dose to levothyroxine 75 g 6 days a week and a half a tab on Sundays. He did tell her that she could have her labs checked elsewhere and he will adjust her dose as needed. She is to follow-up with him in one year. Her most recent labs were done in [place] in June 2016. I will recheck her thyroid panel today and faxed labs to Dr. Precious Bard endocrinology. - Thyroid Panel With TSH       Mar Daring, PA-C  [facility]

## 2014-11-15 LAB — THYROID PANEL WITH TSH
FREE THYROXINE INDEX: 3.1 (ref 1.2–4.9)
T3 UPTAKE RATIO: 28 % (ref 24–39)
T4 TOTAL: 11 ug/dL (ref 4.5–12.0)
TSH: 0.307 u[IU]/mL — AB (ref 0.450–4.500)

## 2014-11-17 ENCOUNTER — Telehealth: Payer: Self-pay

## 2014-11-17 NOTE — Telephone Encounter (Signed)
-----   Message from Mar Daring, [place] sent at 11/17/2014  1:41 PM EDT ----- Can we please send lab to [facility].  No changes to levothyroxine dose currently.  May recheck in 6 months if needed or sooner if directed by [facility].  Thanks.

## 2014-11-17 NOTE — Telephone Encounter (Signed)
Attempted to contact patient on home and mobile, both numbers are disconnected. Contacted [facility] to verify patient was a patient there. Per receptionist patient is treated by Dr. Tod Persia. Lab results faxed to [facility] at [phone]. Letter mailed to patient's address in chart.

## 2014-11-18 IMAGING — US US PELVIS COMPLETE
1 series · 14 of 25 positions shown · non-contrast
Comparison: No recent prior.

CLINICAL DATA: Abdominal pain.

EXAM:
TRANSABDOMINAL ULTRASOUND OF PELVIS
TECHNIQUE: Transabdominal ultrasound examination of the pelvis was performed
including evaluation of the uterus, ovaries, adnexal regions, and
pelvic cul-de-sac.

[Series 1: us pelvis complete · 0.20mm/px · 14 of 25 slices shown]
[im 1/25]
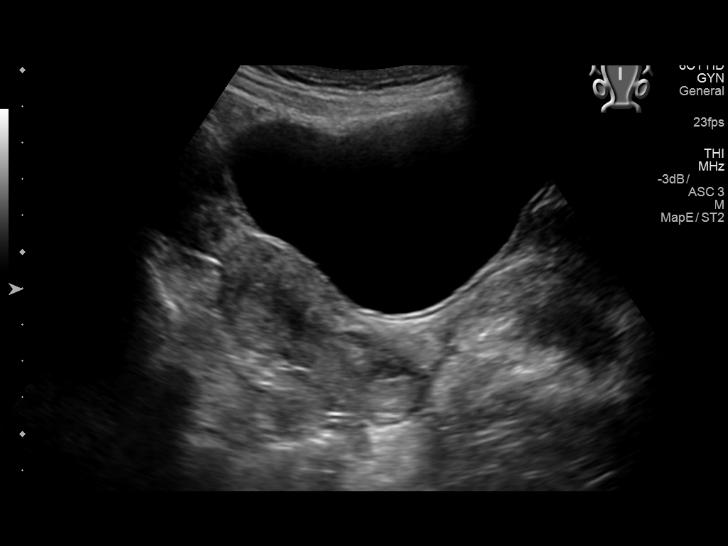
[im 3/25]
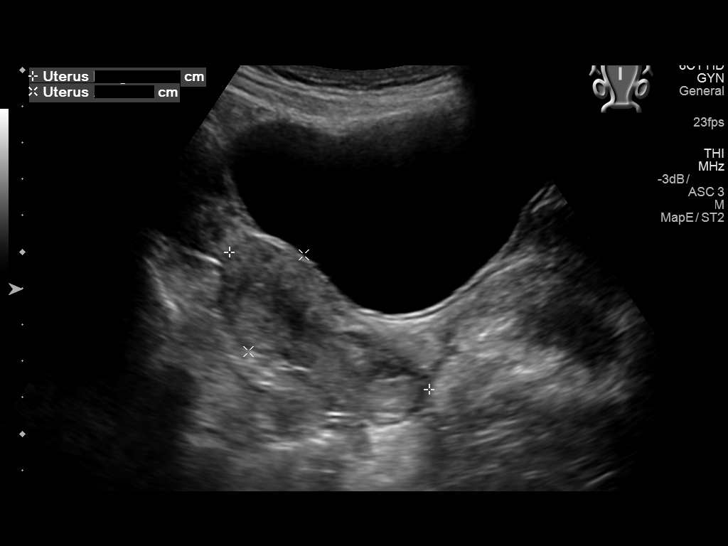
[im 5/25]
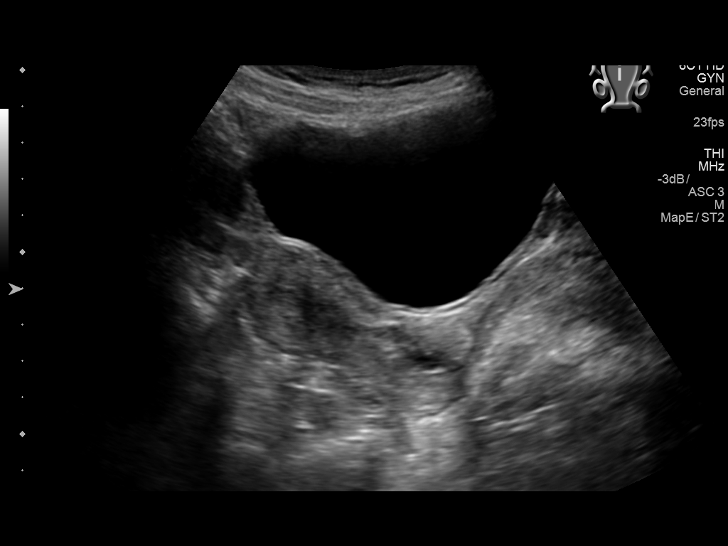
[im 7/25]
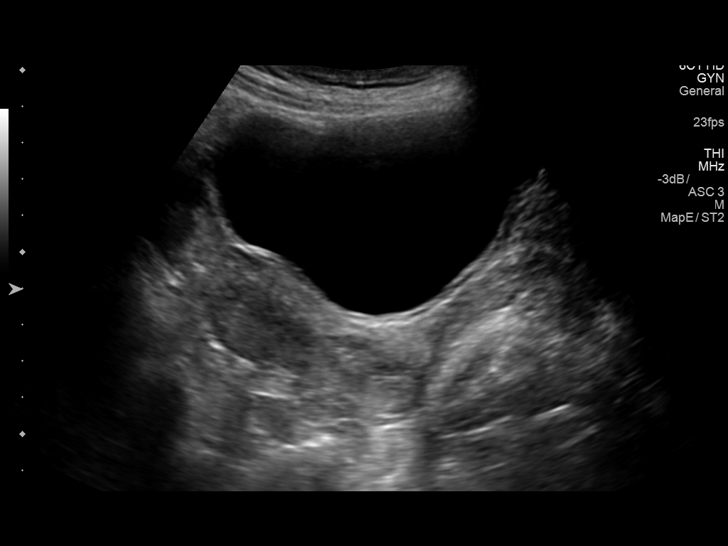
[im 9/25]
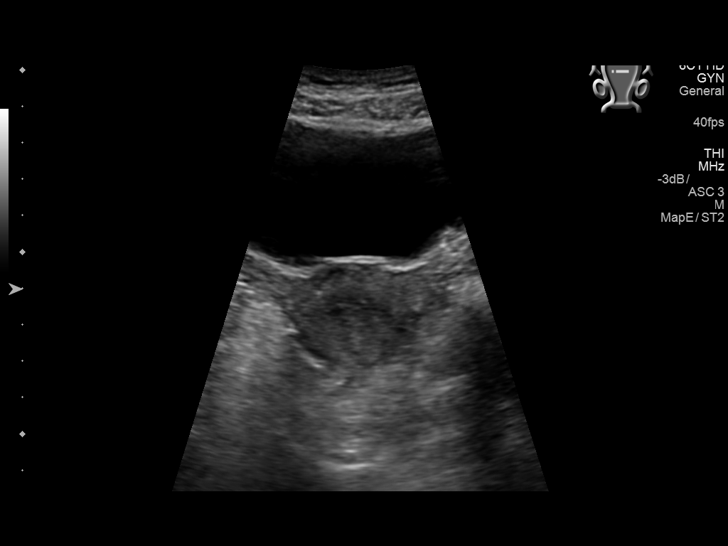
[im 10/25]
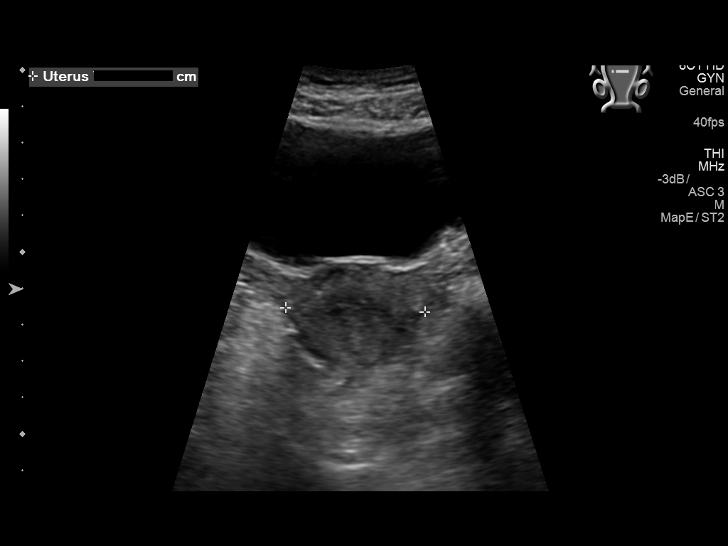
[im 12/25]
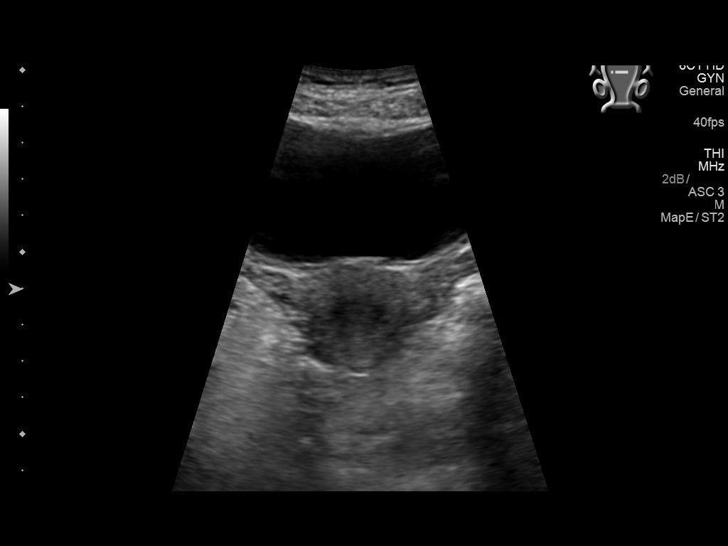
[im 14/25]
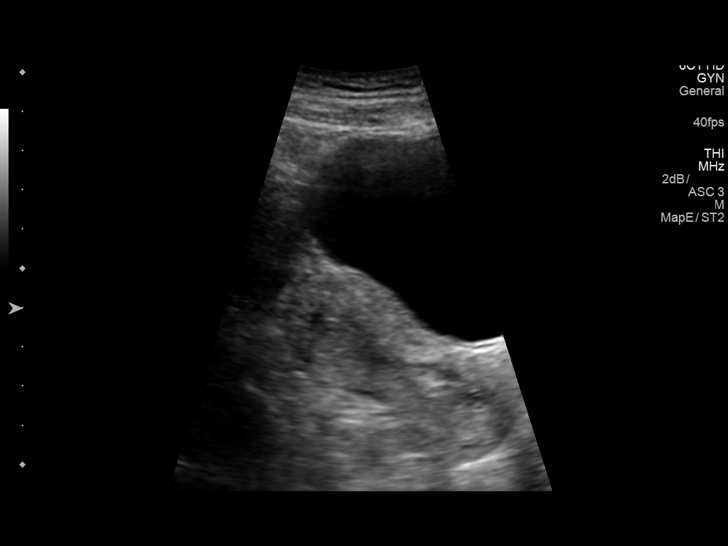
[im 16/25]
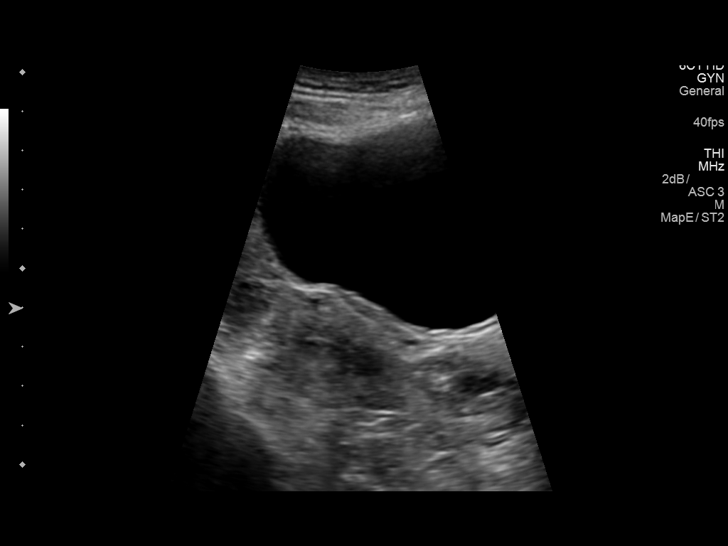
[im 17/25]
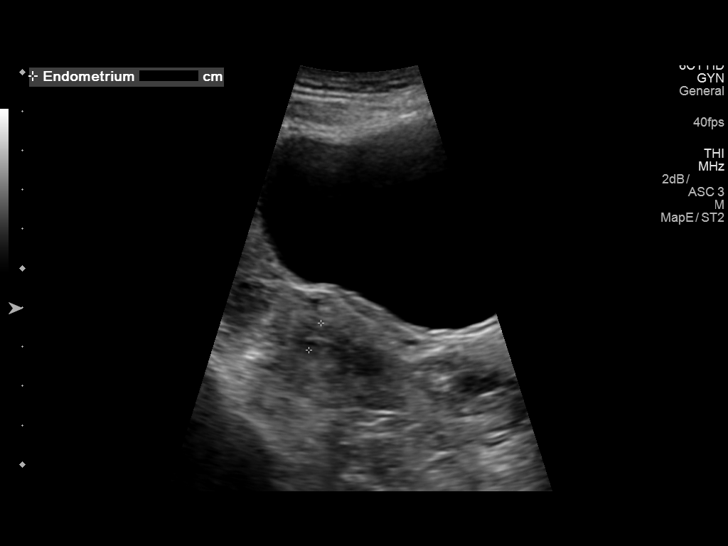
[im 19/25]
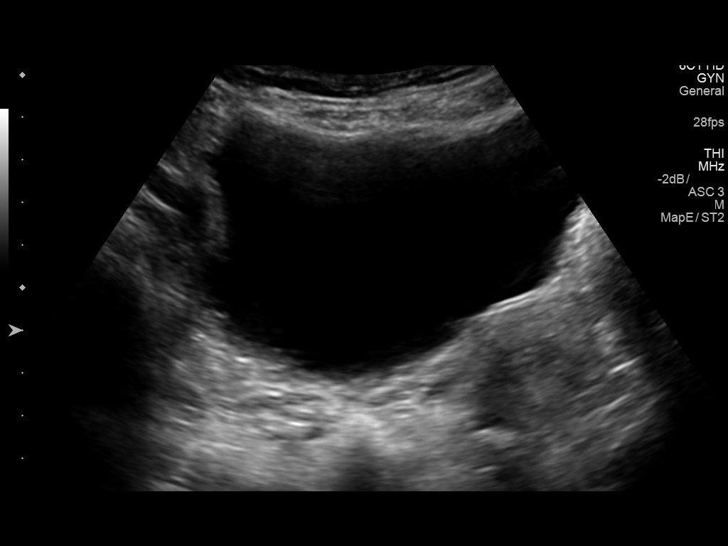
[im 21/25]
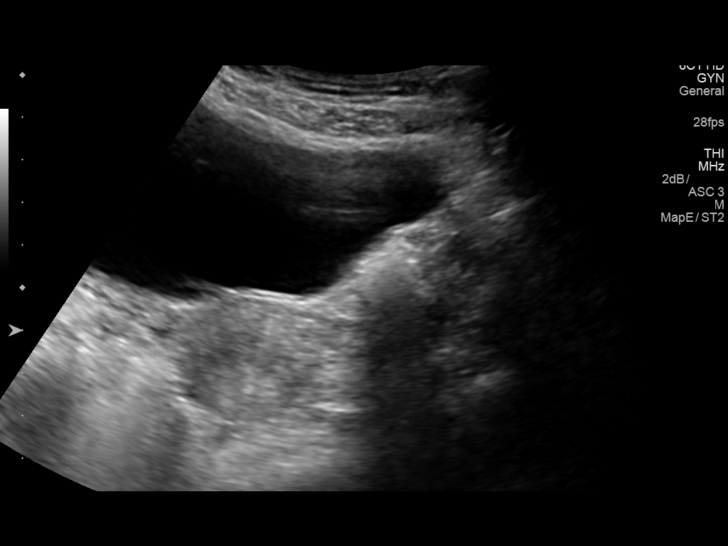
[im 23/25]
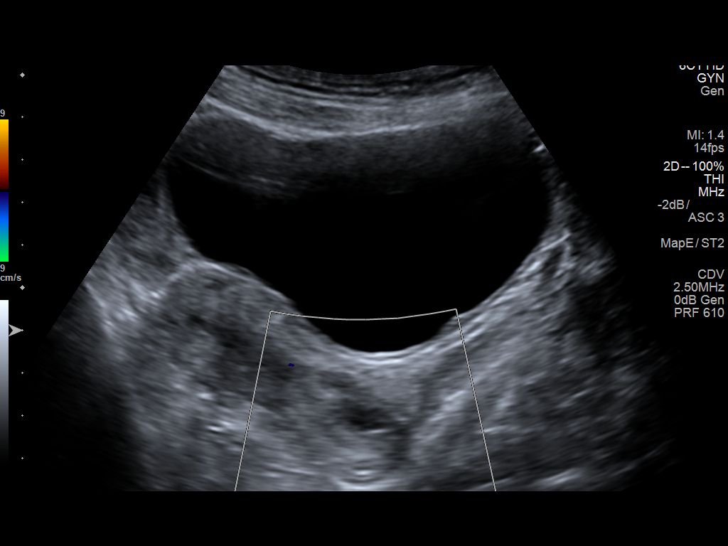
[im 25/25]
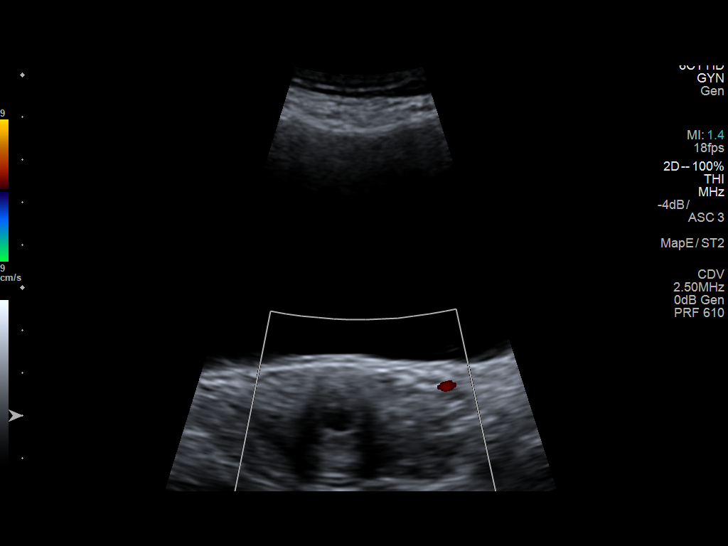

[14 of 25 positions shown; findings below may reference images not displayed]

FINDINGS: Uterus

Measurements: 6.7 x 3.1 x 3.8 cm. No fibroids or other mass
visualized. Probable nabothian cyst.

Endometrium

Thickness: 7.5 mm.  No focal abnormality visualized.

Right ovary

Measurements: Not visualized. Normal appearance/no adnexal mass.

Left ovary

Measurements: Not visualized. Normal appearance/no adnexal mass.

Other findings: No abnormal free fluid. Patient refused transvaginal
exam.
IMPRESSION: Probable nabothian cyst.  No focal abnormality otherwise noted.

## 2014-11-26 DIAGNOSIS — H1089 Other conjunctivitis: Secondary | ICD-10-CM | POA: Diagnosis not present

## 2014-11-26 DIAGNOSIS — I10 Essential (primary) hypertension: Secondary | ICD-10-CM | POA: Diagnosis not present

## 2014-11-26 DIAGNOSIS — H04123 Dry eye syndrome of bilateral lacrimal glands: Secondary | ICD-10-CM | POA: Diagnosis not present

## 2014-12-03 ENCOUNTER — Ambulatory Visit (INDEPENDENT_AMBULATORY_CARE_PROVIDER_SITE_OTHER): Payer: Medicare Other | Admitting: Physician Assistant

## 2014-12-03 ENCOUNTER — Encounter: Payer: Self-pay | Admitting: Physician Assistant

## 2014-12-03 VITALS — BP 118/84 | HR 76 | Temp 98.0°F | Resp 16 | Wt 100.2 lb

## 2014-12-03 DIAGNOSIS — Z23 Encounter for immunization: Secondary | ICD-10-CM | POA: Diagnosis not present

## 2014-12-03 DIAGNOSIS — J309 Allergic rhinitis, unspecified: Principal | ICD-10-CM

## 2014-12-03 DIAGNOSIS — H1013 Acute atopic conjunctivitis, bilateral: Secondary | ICD-10-CM | POA: Diagnosis not present

## 2014-12-03 NOTE — Progress Notes (Signed)
Patient ID: Diamond Lucas, female   DOB: [birth year], 54 y.o.   MRN: [id number]       Patient: Diamond Lucas Female    DOB: 1961-02-22   54 y.o.   MRN: [id number] Visit Date: 12/03/2014  Today's Provider: Mar Daring, PA-C   Chief Complaint  Patient presents with  . eye infection    red eyes, started on a week ago, went to see Dr. Matilde Sprang eye doctor, and he prescribed tobramycin antb eye drops, which are not working.  . Fever    feeling hot and dry, started a few days ago. took a benadryl OTC did not help.  . waiting for Lab results    11/14/2014   Subjective:    Fever  This is a new problem. The current episode started in the past 7 days (3 days ago). The problem occurs daily. The problem has been unchanged. Her temperature was unmeasured prior to arrival. Associated symptoms include abdominal pain, congestion, coughing and a sore throat. Pertinent negatives include no chest pain, ear pain, headaches, nausea or wheezing. Associated symptoms comments: Constipation, took stool softener, which helped a little bit". Treatments tried: benadryl last night. The treatment provided no relief.  She feels this is most likely secondary to allergies.  She states she was cleaning her apartment and went to change the air filter but it was blocked up with dust.  She cleaned it all out and changed the filter but has had circulating dust in her apartment since.  She has not taken any oral anti-histamines besides benadryl once.  She is interested in a referral to an allergist as she would like allergy injections instead of doing oral treatment since she likes to travel and would have to carry pill bottles with her.       Allergies  Allergen Reactions  . Diclofenac Sodium Palpitations  . Naproxen Itching, Rash and Hives  . Dust Mite Mixed Allergen Ext [Mite (D. Farinae)] Itching    Respiratory illness, and itchy eyes, eye problems  . Nsaids    Previous Medications   CYCLOSPORINE (RESTASIS)  0.05 % OPHTHALMIC EMULSION    1 drop.   LEVOTHYROXINE (SYNTHROID, LEVOTHROID) 75 MCG TABLET    TAKE 1 TABLET BY MOUTH DAILY   MOMETASONE (NASONEX) 50 MCG/ACT NASAL SPRAY    Place into the nose.   MULTIPLE VITAMINS-MINERALS (MULTIVITAMIN ADULT PO)    Take by mouth.   RISPERIDONE (RISPERDAL M-TABS) 2 MG DISINTEGRATING TABLET        Review of Systems  Constitutional: Positive for fever (never measured).  HENT: Positive for congestion, rhinorrhea, sneezing and sore throat. Negative for ear pain.   Eyes: Positive for discharge and itching. Negative for photophobia, pain and visual disturbance.  Respiratory: Positive for cough. Negative for choking, chest tightness and wheezing.   Cardiovascular: Negative for chest pain.  Gastrointestinal: Positive for abdominal pain and constipation (chronic issue). Negative for nausea.  Musculoskeletal: Negative.   Skin: Negative.   Neurological: Negative for headaches.    Social History  Substance Use Topics  . Smoking status: Never Smoker   . Smokeless tobacco: Never Used  . Alcohol Use: No   Objective:   BP 118/84 mmHg  Pulse 76  Temp(Src) 98 F (36.7 C) (Oral)  Resp 16  Wt 100 lb 3.2 oz (45.45 kg)  SpO2 98%  LMP 11/11/2014  Physical Exam  Constitutional: She appears well-developed and well-nourished. No distress.  Eyes: Conjunctivae and EOM are normal. Pupils are equal, round,  and reactive to light. Right eye exhibits no discharge. Left eye exhibits no discharge.  She does report itching of bottom eye lid currently but no redness or signs of infection noted.  Cardiovascular: Normal rate, regular rhythm and normal heart sounds.  Exam reveals no gallop and no friction rub.   No murmur heard. Pulmonary/Chest: Effort normal and breath sounds normal. No respiratory distress. She has no wheezes. She has no rales.  Lymphadenopathy:    She has no cervical adenopathy.  Skin: Skin is warm and dry. She is not diaphoretic.  Vitals reviewed.         Assessment & Plan:     1. Allergic conjunctivitis and rhinitis, bilateral Advised to start either zyrtec or claritin along with anti-histamine eye drops, which she states she is already using.  I will refer her to an allergy specialist for consideration of injection therapy per her request, but I did advise to her that they may want her to fail oral anti-histamine and allergy treatments prior to injection therapy.  She voiced understanding and still requested the referral. - Ambulatory referral to Allergy  2. Need for shingles vaccine Shingles vaccine given today. - Varicella-zoster Immune Globulin       Mar Daring, PA-C  [facility]

## 2014-12-03 NOTE — Patient Instructions (Signed)
Herpes Zoster Virus Vaccine What is this medicine? HERPES ZOSTER VIRUS VACCINE (HUR peez ZOS ter vahy ruhs vak SEEN) is a vaccine. It is used to prevent shingles in adults 52 years old and over. This vaccine is not used to treat shingles or nerve pain from shingles. This medicine may be used for other purposes; ask your health care provider or pharmacist if you have questions. COMMON BRAND NAME(S): Varivax, Zostavax What should I tell my health care provider before I take this medicine? They need to know if you have any of these conditions: -cancer like leukemia or lymphoma -immune system problems or therapy -infection with fever -tuberculosis -an unusual or allergic reaction to vaccines, neomycin, gelatin, other medicines, foods, dyes, or preservatives -pregnant or trying to get pregnant -breast-feeding How should I use this medicine? This vaccine is for injection under the skin. It is given by a health care professional. Talk to your pediatrician regarding the use of this medicine in children. This medicine is not approved for use in children. Overdosage: If you think you have taken too much of this medicine contact a poison control center or emergency room at once. NOTE: This medicine is only for you. Do not share this medicine with others. What if I miss a dose? This does not apply. What may interact with this medicine? Do not take this medicine with any of the following medications: -adalimumab -anakinra -etanercept -infliximab -medicines to treat cancer -medicines that suppress your immune system This medicine may also interact with the following medications: -immunoglobulins -steroid medicines like prednisone or cortisone This list may not describe all possible interactions. Give your health care provider a list of all the medicines, herbs, non-prescription drugs, or dietary supplements you use. Also tell them if you smoke, drink alcohol, or use illegal drugs. Some items may  interact with your medicine. What should I watch for while using this medicine? Visit your doctor for regular check ups. This vaccine, like all vaccines, may not fully protect everyone. After receiving this vaccine it may be possible to pass chickenpox infection to others. Avoid people with immune system problems, pregnant women who have not had chickenpox, and newborns of women who have not had chickenpox. Talk to your doctor for more information. What side effects may I notice from receiving this medicine? Side effects that you should report to your doctor or health care professional as soon as possible: -allergic reactions like skin rash, itching or hives, swelling of the face, lips, or tongue -breathing problems -feeling faint or lightheaded, falls -fever, flu-like symptoms -pain, tingling, numbness in the hands or feet -swelling of the ankles, feet, hands -unusually weak or tired Side effects that usually do not require medical attention (report to your doctor or health care professional if they continue or are bothersome): -aches or pains -chickenpox-like rash -diarrhea -headache -loss of appetite -nausea, vomiting -redness, pain, swelling at site where injected -runny nose This list may not describe all possible side effects. Call your doctor for medical advice about side effects. You may report side effects to FDA at 1-800-FDA-1088. Where should I keep my medicine? This drug is given in a hospital or clinic and will not be stored at home. NOTE: This sheet is a summary. It may not cover all possible information. If you have questions about this medicine, talk to your doctor, pharmacist, or health care provider.  2015, Elsevier/Gold Standard. (2009-08-31 17:43:50) Allergies Allergies may happen from anything your body is sensitive to. This may be food, medicines, pollens,  chemicals, and nearly anything around you in everyday life that produces allergens. An allergen is anything  that causes an allergy producing substance. Heredity is often a factor in causing these problems. This means you may have some of the same allergies as your parents. Food allergies happen in all age groups. Food allergies are some of the most severe and life threatening. Some common food allergies are cow's milk, seafood, eggs, nuts, wheat, and soybeans. SYMPTOMS   Swelling around the mouth.  An itchy red rash or hives.  Vomiting or diarrhea.  Difficulty breathing. SEVERE ALLERGIC REACTIONS ARE LIFE-THREATENING. This reaction is called anaphylaxis. It can cause the mouth and throat to swell and cause difficulty with breathing and swallowing. In severe reactions only a trace amount of food (for example, peanut oil in a salad) may cause death within seconds. Seasonal allergies occur in all age groups. These are seasonal because they usually occur during the same season every year. They may be a reaction to molds, grass pollens, or tree pollens. Other causes of problems are house dust mite allergens, pet dander, and mold spores. The symptoms often consist of nasal congestion, a runny itchy nose associated with sneezing, and tearing itchy eyes. There is often an associated itching of the mouth and ears. The problems happen when you come in contact with pollens and other allergens. Allergens are the particles in the air that the body reacts to with an allergic reaction. This causes you to release allergic antibodies. Through a chain of events, these eventually cause you to release histamine into the blood stream. Although it is meant to be protective to the body, it is this release that causes your discomfort. This is why you were given anti-histamines to feel better. If you are unable to pinpoint the offending allergen, it may be determined by skin or blood testing. Allergies cannot be cured but can be controlled with medicine. Hay fever is a collection of all or some of the seasonal allergy problems. It  may often be treated with simple over-the-counter medicine such as diphenhydramine. Take medicine as directed. Do not drink alcohol or drive while taking this medicine. Check with your caregiver or package insert for child dosages. If these medicines are not effective, there are many new medicines your caregiver can prescribe. Stronger medicine such as nasal spray, eye drops, and corticosteroids may be used if the first things you try do not work well. Other treatments such as immunotherapy or desensitizing injections can be used if all else fails. Follow up with your caregiver if problems continue. These seasonal allergies are usually not life threatening. They are generally more of a nuisance that can often be handled using medicine. HOME CARE INSTRUCTIONS   If unsure what causes a reaction, keep a diary of foods eaten and symptoms that follow. Avoid foods that cause reactions.  If hives or rash are present:  Take medicine as directed.  You may use an over-the-counter antihistamine (diphenhydramine) for hives and itching as needed.  Apply cold compresses (cloths) to the skin or take baths in cool water. Avoid hot baths or showers. Heat will make a rash and itching worse.  If you are severely allergic:  Following a treatment for a severe reaction, hospitalization is often required for closer follow-up.  Wear a medic-alert bracelet or necklace stating the allergy.  You and your family must learn how to give adrenaline or use an anaphylaxis kit.  If you have had a severe reaction, always carry your anaphylaxis kit  or EpiPen with you. Use this medicine as directed by your caregiver if a severe reaction is occurring. Failure to do so could have a fatal outcome. SEEK MEDICAL CARE IF:  You suspect a food allergy. Symptoms generally happen within 30 minutes of eating a food.  Your symptoms have not gone away within 2 days or are getting worse.  You develop new symptoms.  You want to retest  yourself or your child with a food or drink you think causes an allergic reaction. Never do this if an anaphylactic reaction to that food or drink has happened before. Only do this under the care of a caregiver. SEEK IMMEDIATE MEDICAL CARE IF:   You have difficulty breathing, are wheezing, or have a tight feeling in your chest or throat.  You have a swollen mouth, or you have hives, swelling, or itching all over your body.  You have had a severe reaction that has responded to your anaphylaxis kit or an EpiPen. These reactions may return when the medicine has worn off. These reactions should be considered life threatening. MAKE SURE YOU:   Understand these instructions.  Will watch your condition.  Will get help right away if you are not doing well or get worse. Document Released: 06/07/2002 Document Revised: 07/09/2012 Document Reviewed: 11/12/2007 [facility] Patient Information 2015 [place], [place]. This information is not intended to replace advice given to you by your health care provider. Make sure you discuss any questions you have with your health care provider.

## 2014-12-05 ENCOUNTER — Encounter: Payer: Self-pay | Admitting: Physician Assistant

## 2014-12-12 ENCOUNTER — Telehealth: Payer: Self-pay | Admitting: Physician Assistant

## 2014-12-12 ENCOUNTER — Ambulatory Visit (INDEPENDENT_AMBULATORY_CARE_PROVIDER_SITE_OTHER): Payer: Medicare Other | Admitting: Physician Assistant

## 2014-12-12 ENCOUNTER — Encounter: Payer: Self-pay | Admitting: Physician Assistant

## 2014-12-12 VITALS — BP 110/60 | HR 60 | Temp 97.4°F | Resp 16 | Wt 98.8 lb

## 2014-12-12 DIAGNOSIS — H01139 Eczematous dermatitis of unspecified eye, unspecified eyelid: Secondary | ICD-10-CM | POA: Diagnosis not present

## 2014-12-12 MED ORDER — BACITRACIN-POLYMYXIN B 500-10000 UNIT/GM OP OINT: TOPICAL_OINTMENT | OPHTHALMIC | Status: DC

## 2014-12-12 NOTE — Telephone Encounter (Signed)
She could come in this afternoon for me to look at it if she wishes rather than going to ER.

## 2014-12-12 NOTE — Telephone Encounter (Signed)
Per email received on 12/12/14: Please let my doc Jenn know, no I'll effects from the vaccine, but,, mites might send me to the er, eye area really sore in the morning. Sent from my [employer] 4G LTE Android device

## 2014-12-12 NOTE — Progress Notes (Signed)
Patient: Diamond Lucas Female    DOB: Feb 25, 1961   54 y.o.   MRN: [id number] Visit Date: 12/12/2014  Today's Provider: Mar Daring, PA-C   Chief Complaint  Patient presents with  . eye itching   Subjective:    HPI Diamond Lucas 54 year old comes today concern about her eyes itching since August 31. Eye lids itch, sometimes inside her eyes but mostly around the eye area. She does have redness on the upper and lower eyelid and also complains of swelling which is worse in the mornings. She states that a couple of times she has had green drainage from the corner of her eye. She questions if she has mites in her eyelids. She states she tried to look with a magnifying glass but did not see anything.    Allergies  Allergen Reactions  . Diclofenac Sodium Palpitations  . Naproxen Itching, Rash and Hives  . Dust Mite Mixed Allergen Ext [Mite (D. Farinae)] Itching    Respiratory illness, and itchy eyes, eye problems  . Nsaids    Previous Medications   CYCLOSPORINE (RESTASIS) 0.05 % OPHTHALMIC EMULSION    1 drop.   LEVOTHYROXINE (SYNTHROID, LEVOTHROID) 75 MCG TABLET    TAKE 1 TABLET BY MOUTH DAILY   MOMETASONE (NASONEX) 50 MCG/ACT NASAL SPRAY    Place into the nose.   MULTIPLE VITAMINS-MINERALS (MULTIVITAMIN ADULT PO)    Take by mouth.   RISPERIDONE (RISPERDAL M-TABS) 2 MG DISINTEGRATING TABLET        Review of Systems  Constitutional: Positive for fever (feels like has a fever).  HENT: Negative.   Eyes: Positive for redness (around the eye area) and itching. Negative for discharge and visual disturbance.  Respiratory: Positive for cough.   Cardiovascular: Negative.   Gastrointestinal: Negative.   Endocrine: Negative.   Genitourinary: Negative.   Musculoskeletal: Negative.   Skin: Negative.   Allergic/Immunologic: Positive for environmental allergies.       Seasonal  Neurological: Negative.   Hematological: Negative.   Psychiatric/Behavioral: Negative.      Social History  Substance Use Topics  . Smoking status: Never Smoker   . Smokeless tobacco: Never Used  . Alcohol Use: No   Objective:   BP 110/60 mmHg  Pulse 60  Temp(Src) 97.4 F (36.3 C) (Oral)  Resp 16  Wt 98 lb 12.8 oz (44.815 kg)  LMP 11/11/2014  Physical Exam  Constitutional: She appears well-developed and well-nourished. No distress.  Eyes: Conjunctivae and EOM are normal. Pupils are equal, round, and reactive to light. Right eye exhibits no discharge. Left eye exhibits no discharge.    Skin of upper and lower lids bilaterally as well as under the eye to just above the zygomatic arch there is erythematous and flaky skin.  No drainage present currently.  Positive itching.        Assessment & Plan:     1. Eczema of eyelid, unspecified laterality Skin irritation and dryness around the eyes bilaterally seems most consistent with eczema. She is questioning the possibility of mites around her eyes. She was scared that if I gave her a steroid cream that it may feed the mites and calls the irritation to be worse. Thus I will prescribe bacitracin ointment as below. I did advise her that if this does not help or if she continues to have itching for her to please call the office. I did discuss the possibility of adding hydroxyzine for the itch if there is  no improvement. She will call if symptoms persist or worsen. - bacitracin-polymyxin b (POLYSPORIN) ophthalmic ointment; Apply around both eyes TID.  Dispense: 3.5 g; Refill: 0       Mar Daring, PA-C  [facility]

## 2014-12-12 NOTE — Telephone Encounter (Signed)
LMTCB  Thanks,  -[name] 

## 2014-12-12 NOTE — Telephone Encounter (Signed)
Patient took appointment at 4.  Thanks,  -[name]

## 2014-12-15 ENCOUNTER — Telehealth: Payer: Self-pay | Admitting: Physician Assistant

## 2014-12-15 NOTE — Telephone Encounter (Signed)
There is no need to bring in the dust.  I do not have a way to test it for anything here at the office.  Best thing is just to try to rid the apartment of dust the best you can.  I advise wearing a mask during the cleaning.  Continue the antibiotic eye ointment.  I also advise to use a thick lotion over the irritated skin such as lubriderm or eucerin to calm the irritation down.  I also would advise to taking an antihistamine such as loratadine (claritin), zyrtec (certirizine) or allegra (fexofenadine).  This may help with some of the other symptoms.

## 2014-12-15 NOTE — Telephone Encounter (Signed)
OK,, last message to this address, my chart not working from this phone.  Please ask Danise Mina if they want me to bring in dust samples, I do not have slides but could put in clear zip lock bag. I am not trying to sue my landlord lord but really the inside of the ductwork was already thick with dust as tops of door frames. Maybe more than one species living in there and on me. My nose was green again this am. Sent from my [employer] 4G LTE Android device

## 2014-12-15 NOTE — Telephone Encounter (Signed)
LMTCB  Thanks,  -[name] 

## 2014-12-16 NOTE — Telephone Encounter (Signed)
Patient advised as directed below. Per patient eyebrows are itching and is itching between the eye lashes still thinks that she has mites. Patient states that her nose didn't look swollen today it was better. Patient said ok will try as directed below.  Thanks,  -[name]

## 2014-12-22 ENCOUNTER — Telehealth: Payer: Self-pay | Admitting: Physician Assistant

## 2014-12-22 NOTE — Telephone Encounter (Signed)
Pt is calling to say her father just passed away and she needs to travel to see her mom but is not sure whether she needs to travel regarding her condition that you have been treating her for. Patient was difficult to understand and follow.  Call back is [phone]     Thanks Con Memos

## 2014-12-22 NOTE — Telephone Encounter (Signed)
LMTCB  Thanks,  -[name] 

## 2014-12-22 NOTE — Telephone Encounter (Signed)
For what she is being treated for she should be ok to travel as long as her vision is still ok.  Last I saw her it was.  It may do her good to get out of the apartment.  Thanks.

## 2014-12-23 ENCOUNTER — Encounter: Payer: Self-pay | Admitting: Family Medicine

## 2014-12-23 ENCOUNTER — Ambulatory Visit (INDEPENDENT_AMBULATORY_CARE_PROVIDER_SITE_OTHER): Payer: Medicare Other | Admitting: Family Medicine

## 2014-12-23 VITALS — BP 100/80 | HR 62 | Temp 97.9°F | Resp 16 | Wt 100.8 lb

## 2014-12-23 DIAGNOSIS — J309 Allergic rhinitis, unspecified: Secondary | ICD-10-CM

## 2014-12-23 DIAGNOSIS — J069 Acute upper respiratory infection, unspecified: Secondary | ICD-10-CM

## 2014-12-23 MED ORDER — MONTELUKAST SODIUM 10 MG PO TABS: 10.0000 mg | ORAL_TABLET | Freq: Every day | ORAL | Status: DC

## 2014-12-23 NOTE — Telephone Encounter (Signed)
Patient came to see Dr. Caryn Section this afternoon.  Thanks,  -[name]

## 2014-12-23 NOTE — Telephone Encounter (Signed)
Left messagwe for patient to call back and schedule an appt today.  Thanks [employer]

## 2014-12-23 NOTE — Telephone Encounter (Signed)
Please schedule appointment as patient requested. Thanks.

## 2014-12-23 NOTE — Telephone Encounter (Signed)
Patient advised as directed below. Patient states that "thinks she has walking pneumonia and wants to be seen. Patient is aware Tawanna Sat is not here today and per patient just need a provider to see her. Wants to be better to travel since her father passed away this 07-07-22.Per patient still has redness around her eye area, which Tawanna Sat has seen her for.  Patient states had a fever yesterday, now with cough and nose itching.  Please advise.  Thanks,  -[name]

## 2014-12-23 NOTE — Progress Notes (Signed)
Patient: Diamond Lucas Female    DOB: Feb 04, 1961   54 y.o.   MRN: [id number] Visit Date: 12/23/2014  Today's Provider: Lelon Huh, MD   Chief Complaint  Patient presents with  . URI   Subjective:    URI  This is a new problem. The current episode started 1 to 4 weeks ago. The problem has been unchanged. Maximum temperature: Per patient had a fever yesterday. Took Benadryl today. The fever has been present for less than 1 day. Associated symptoms include congestion, coughing, sinus pain, sneezing, a sore throat and wheezing. Pertinent negatives include no abdominal pain, chest pain, nausea or vomiting. Treatments tried: Benadryl. The treatment provided no relief.   States she has had a lot of itching since August. Has been exposed to dust coming our of her ventilation system. Seen [facility] 9/16 and prescribed Polysporin for her eyes which have greatly improved. However she reports her eyes still feel a little scratchy.  Now having an intermittent cough for the last 3-4 weeks. Benadryl helps for several hours.     Allergies  Allergen Reactions  . Diclofenac Sodium Palpitations  . Naproxen Itching, Rash and Hives  . Dust Mite Mixed Allergen Ext [Mite (D. Farinae)] Itching    Respiratory illness, and itchy eyes, eye problems  . Nsaids    Previous Medications   BACITRACIN-POLYMYXIN B (POLYSPORIN) OPHTHALMIC OINTMENT    Apply around both eyes TID.   CYCLOSPORINE (RESTASIS) 0.05 % OPHTHALMIC EMULSION    1 drop.   LEVOTHYROXINE (SYNTHROID, LEVOTHROID) 75 MCG TABLET    TAKE 1 TABLET BY MOUTH DAILY   MOMETASONE (NASONEX) 50 MCG/ACT NASAL SPRAY    Place into the nose.   MULTIPLE VITAMINS-MINERALS (MULTIVITAMIN ADULT PO)    Take by mouth.   RISPERIDONE (RISPERDAL M-TABS) 2 MG DISINTEGRATING TABLET        Review of Systems  Constitutional: Negative for fever, chills, appetite change and fatigue.  HENT: Positive for congestion, sneezing and sore throat.   Eyes: Positive  for redness (around the eye area).  Respiratory: Positive for cough and wheezing. Negative for chest tightness and shortness of breath.   Cardiovascular: Negative for chest pain and palpitations.  Gastrointestinal: Negative for nausea, vomiting and abdominal pain.  Neurological: Negative for dizziness and weakness.    Social History  Substance Use Topics  . Smoking status: Never Smoker   . Smokeless tobacco: Never Used  . Alcohol Use: No   Objective:   BP 100/80 mmHg  Pulse 62  Temp(Src) 97.9 F (36.6 C) (Oral)  Resp 16  Wt 100 lb 12.8 oz (45.723 kg)  LMP 11/11/2014  Physical Exam  General Appearance:    Alert, cooperative, no distress  HENT:   neck without nodes, sinuses nontender, post nasal drip noted and nasal mucosa pale and congested  Eyes:    PERRL, conjunctiva/corneas clear, EOM's intact       Lungs:     Clear to auscultation bilaterally, respirations unlabored  Heart:    Regular rate and rhythm  Neurologic:   Awake, alert, oriented x 3. No apparent focal neurological           defect.            Assessment & Plan:     1. Allergic rhinitis, unspecified allergic rhinitis type  - montelukast (SINGULAIR) 10 MG tablet; Take 1 tablet (10 mg total) by mouth at bedtime.  Dispense: 30 tablet; Refill: 1  2. Upper respiratory  infection Call if symptoms change or if not rapidly improving.          Lelon Huh, MD  [facility]

## 2014-12-26 DIAGNOSIS — J309 Allergic rhinitis, unspecified: Secondary | ICD-10-CM | POA: Insufficient documentation

## 2015-01-25 IMAGING — US US PELVIS COMPLETE
1 series · 14 of 25 positions shown · non-contrast
Comparison: [DATE]

CLINICAL DATA: Pelvic pain for 4 months. Delusions. Patient reports
having bats and a child in the uterus causing pain and contractions.
Patient reports LMP last [REDACTED]. Patient declined transvaginal
portion of the exam.

EXAM:
TRANSABDOMINAL ULTRASOUND OF PELVIS
TECHNIQUE: Transabdominal ultrasound examination of the pelvis was performed
including evaluation of the uterus, ovaries, adnexal regions, and
pelvic cul-de-sac.

[Series 1: us pelvis complete · 0.20mm/px · 14 of 35 slices shown]
[im 1/35]
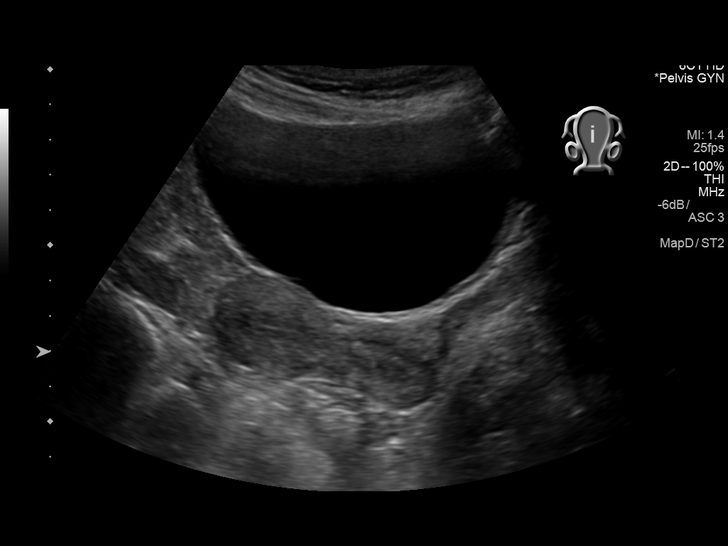
[im 3/35]
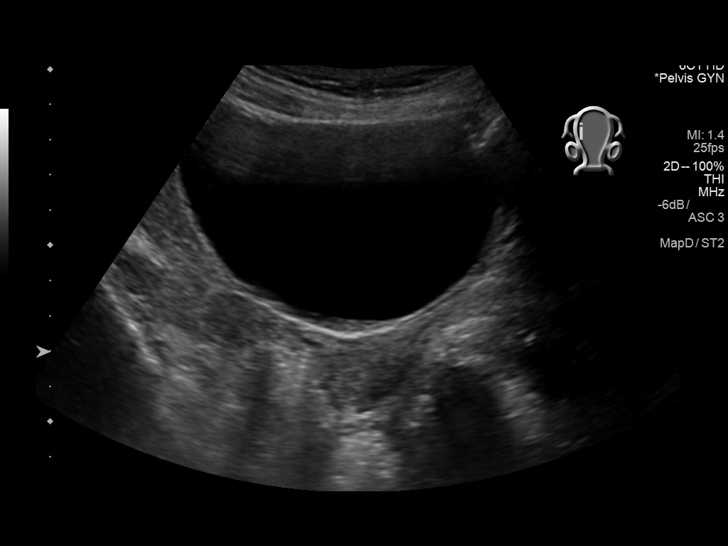
[im 6/35]
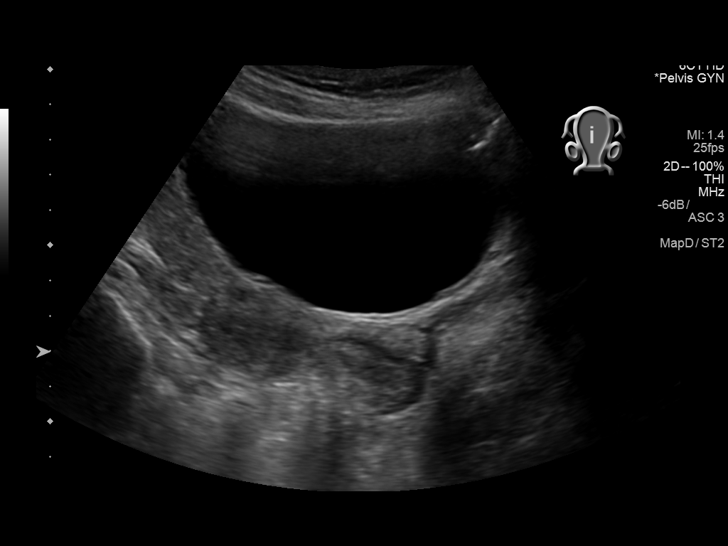
[im 9/35]
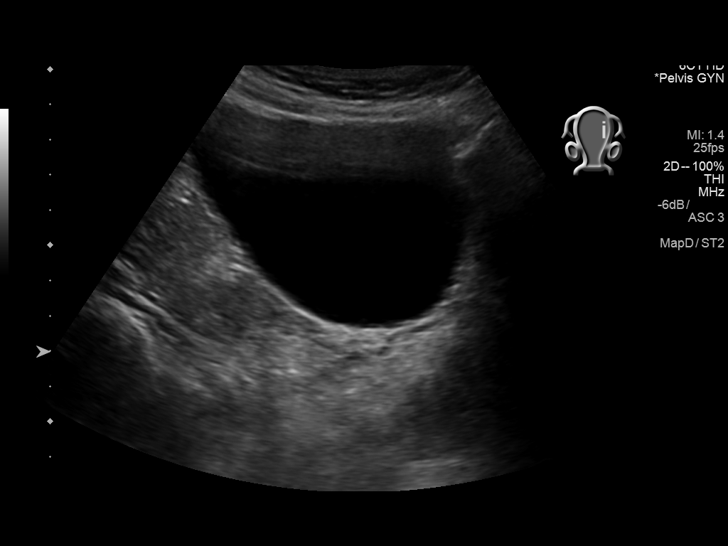
[im 12/35]
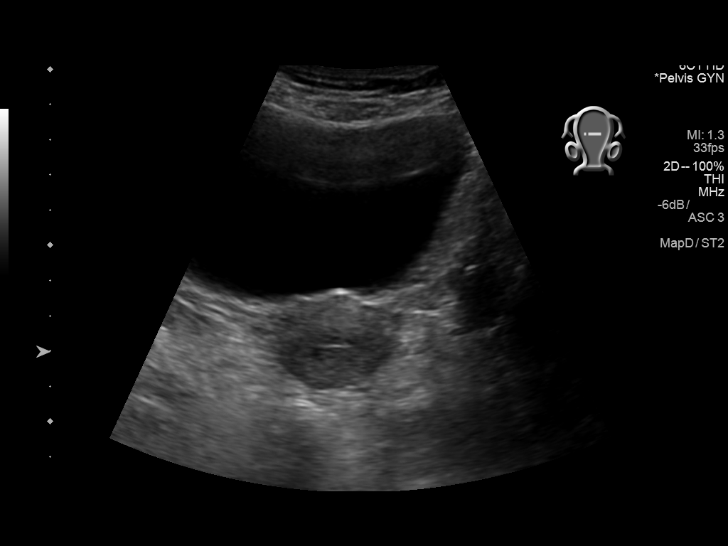
[im 13/35]
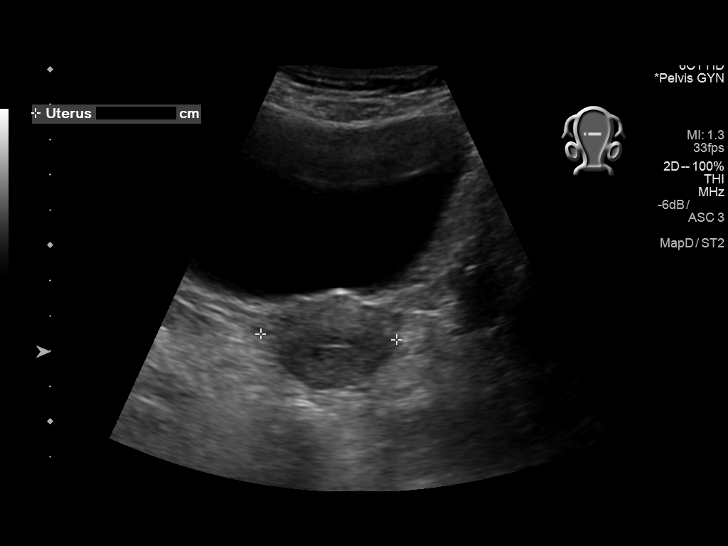
[im 16/35]
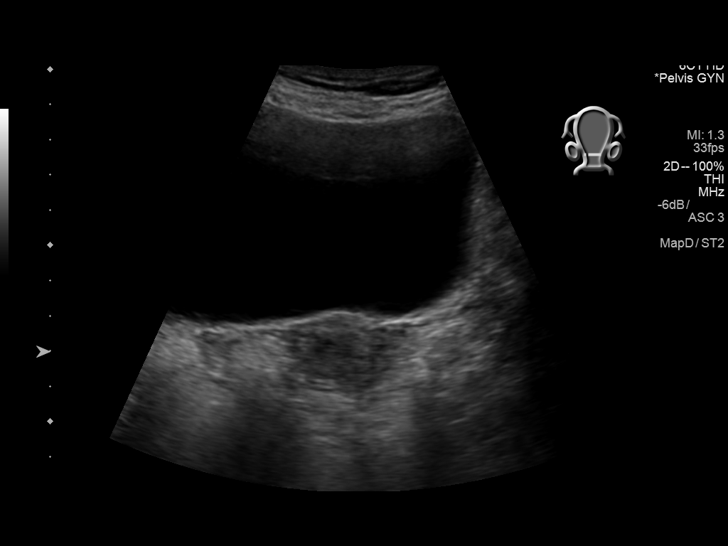
[im 19/35]
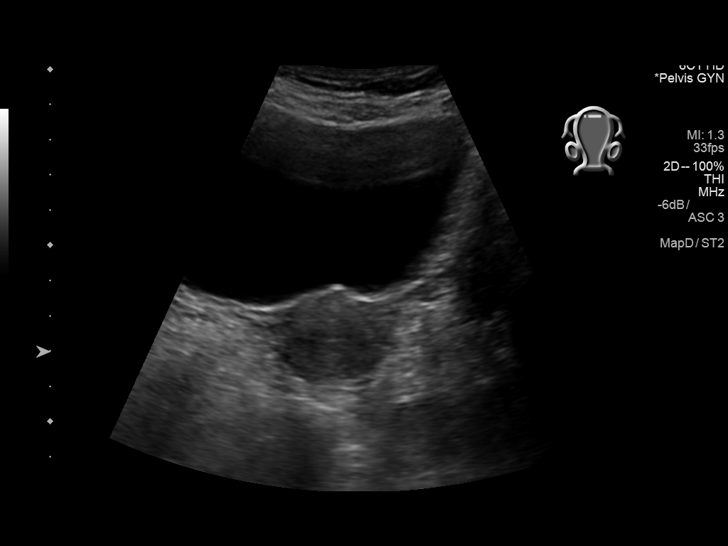
[im 22/35]
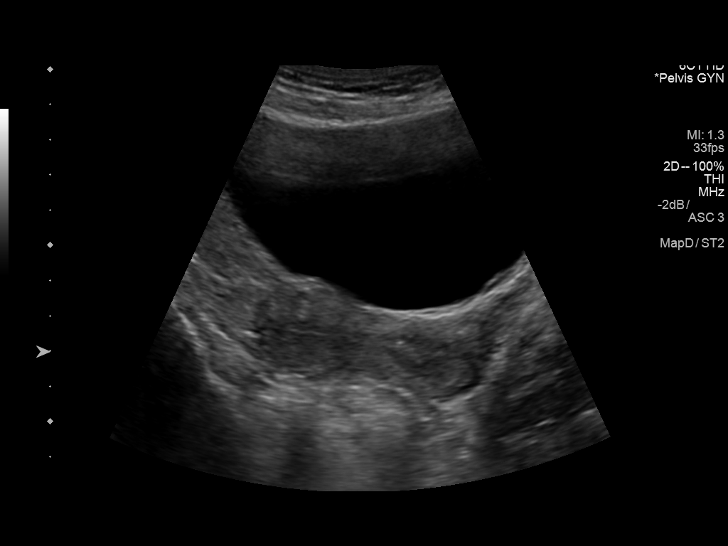
[im 23/35]
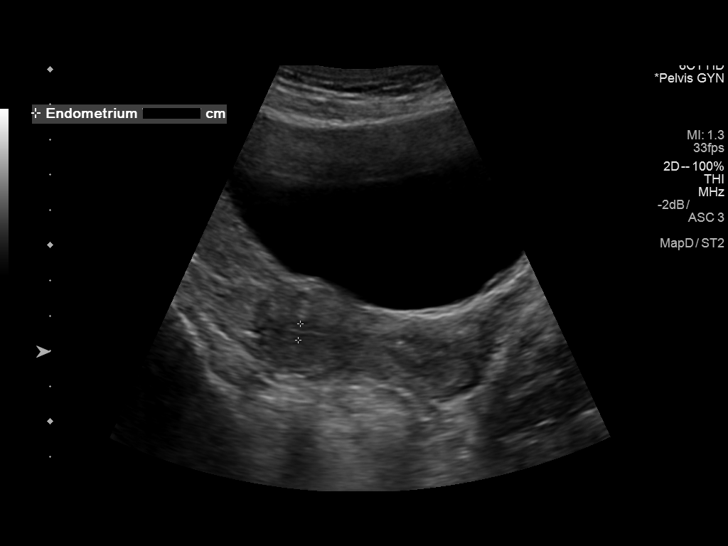
[im 26/35]
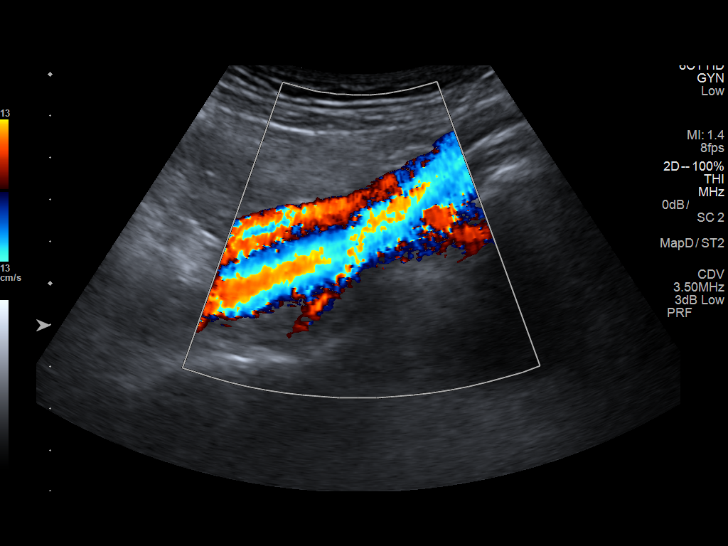
[im 29/35]
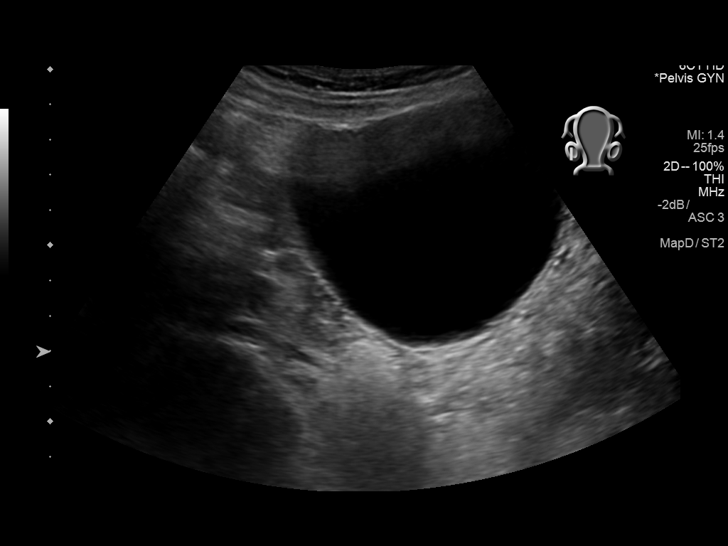
[im 32/35]
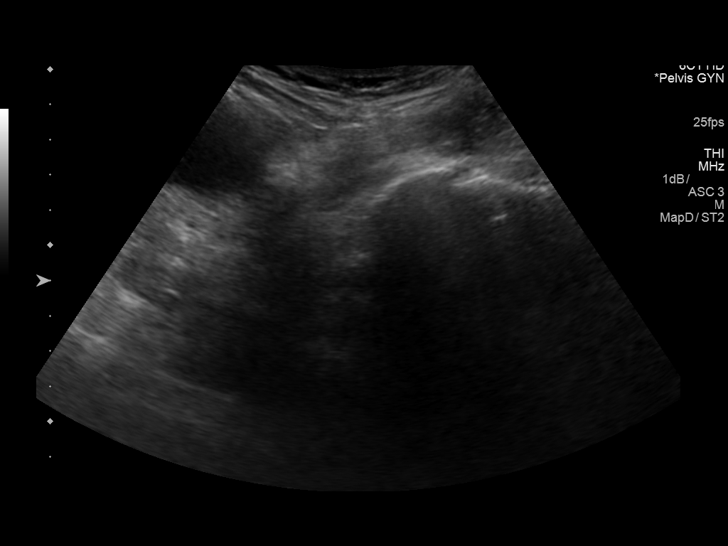
[im 35/35]
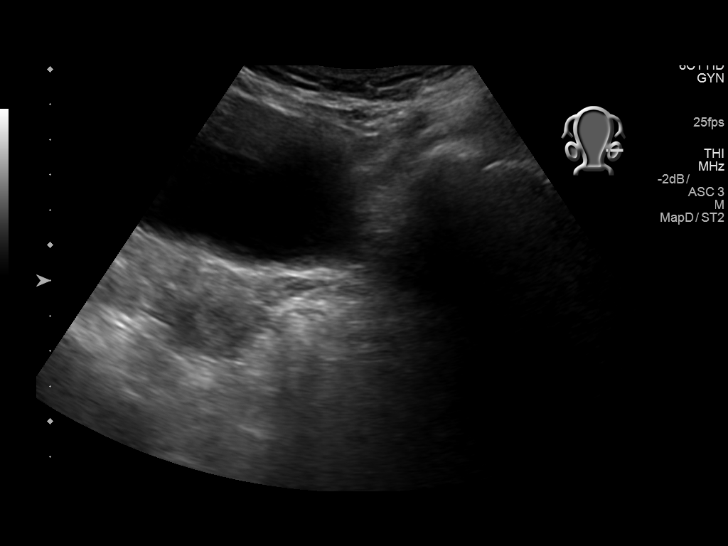

[14 of 25 positions shown; findings below may reference images not displayed]

FINDINGS: Uterus

Measurements: 6.8 x 2.7 x 3.9 cm. No fibroids or other mass
visualized.

Endometrium

Thickness: 4.7 mm.  No focal abnormality visualized.

Right ovary

Measurements: The ovary is not visualized, either absent or obscured
. No adnexal mass.

Left ovary

Measurements: The ovary is not visualized, either absent or obscured
. No adnexal mass.

Other findings:  No abnormal free fluid.
IMPRESSION: Normal appearance of the uterus/endometrium.

Nonvisualized ovaries.  No adnexal mass.

## 2015-01-26 ENCOUNTER — Telehealth: Payer: Self-pay | Admitting: Physician Assistant

## 2015-01-26 ENCOUNTER — Emergency Department
Admission: EM | Admit: 2015-01-26 | Discharge: 2015-01-26 | Discharge status: Against Medical Advice | Payer: Self-pay | Attending: Emergency Medicine | Admitting: Emergency Medicine

## 2015-01-26 DIAGNOSIS — Z23 Encounter for immunization: Secondary | ICD-10-CM | POA: Insufficient documentation

## 2015-01-26 NOTE — Telephone Encounter (Signed)
She needs to call the health department or go to the ER.

## 2015-01-26 NOTE — ED Notes (Signed)
Pt reports that there are rabid bats in her apartment building and was told to come to er to get rabies vaccination.

## 2015-01-26 NOTE — Telephone Encounter (Signed)
Pt stated that there are bats in her apartment building and she would like to be tested for rabies and get a vaccine to prevent rabies. Pt stated that she hasn't been bit yet.Thanks TNP

## 2015-01-26 NOTE — ED Notes (Signed)
During triage pt reports she changed her mind and doesn't want to stay to receive treatment. Reports that she wants to speak with her PCP before proceeding.

## 2015-01-26 NOTE — Telephone Encounter (Signed)
Patient notified

## 2015-02-03 ENCOUNTER — Telehealth: Payer: Self-pay | Admitting: Family Medicine

## 2015-02-03 NOTE — Telephone Encounter (Signed)
Pt states she has bats in her apartment building attic.  Pt is requesting a vaccine of 3 shots for rabies.  Pt is the Health Dept does not do this.  The ER states they only do the vaccine for treatment.  EY#[phone]/[id number]

## 2015-02-04 NOTE — Telephone Encounter (Signed)
LMTCB  Thanks,  -[name] 

## 2015-02-04 NOTE — Telephone Encounter (Signed)
Do we carry rabies vaccine?

## 2015-02-04 NOTE — Telephone Encounter (Signed)
Please notify patient that we do not have the vaccine here in the office.  She can contact the state health department to see where she may can get a vaccine at.   Contact your state health department for guidance of where to get the rabies vaccine:  [place] Department of Health and [employer]  > [email or web address]  > Phone: [phone]  She should also notify her apartment complex manager to see if they will have the bats removed.  If they won't she should contact Theatre manager. Hope this helps.

## 2015-02-04 NOTE — Telephone Encounter (Signed)
We do not do anything with the rabies vaccine or treatment.  The only place I know is the ER.  My suggestion would be for her to call animal control and see if they can have the bats removed.  If animal control says she will need to call a private company she should probably do that.  I was also told once that with bats you do not actually have to be bit by one to be at risk.  If they were to land on her during the night there could be transmission if the bat was rabid.  The best thing she could do is have them removed.  ED

## 2015-02-05 ENCOUNTER — Telehealth: Payer: Self-pay | Admitting: Physician Assistant

## 2015-02-05 DIAGNOSIS — J309 Allergic rhinitis, unspecified: Secondary | ICD-10-CM | POA: Diagnosis not present

## 2015-02-05 DIAGNOSIS — J300 Vasomotor rhinitis: Secondary | ICD-10-CM | POA: Diagnosis not present

## 2015-02-05 NOTE — Telephone Encounter (Signed)
Patient notified. Patient expressed understanding. Patient also stated that someone came in this morning and she thinks they removed them.

## 2015-02-05 NOTE — Telephone Encounter (Signed)
LMTCB  Thanks,  -Isaia Hassell 

## 2015-02-06 ENCOUNTER — Telehealth: Payer: Self-pay | Admitting: Physician Assistant

## 2015-02-06 NOTE — Telephone Encounter (Signed)
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 And wants to know if she can get Flagyl. Or if she can try something over the counter. Patient says that she spoke with doctor Caryn Section about this but maybe he miss understood. Per patient is on her way to the [facility] to see if they have something strong enough to treat.  Please Advise  Thanks,  Ranald Alessio

## 2015-02-06 NOTE — Telephone Encounter (Signed)
Patient scheduled for Monday Nov. 14 @1 :30  Thanks,  -Valery Chance

## 2015-02-06 NOTE — Telephone Encounter (Signed)
Spoke with patient on the phone.  She is going to come see me Monday 11/14 at 1:30pm.  Will you please put her on my schedule for this time.  Thanks!

## 2015-02-09 ENCOUNTER — Encounter: Payer: Self-pay | Admitting: Physician Assistant

## 2015-02-09 ENCOUNTER — Ambulatory Visit (INDEPENDENT_AMBULATORY_CARE_PROVIDER_SITE_OTHER): Payer: Medicare Other | Admitting: Physician Assistant

## 2015-02-09 VITALS — BP 100/60 | HR 67 | Temp 97.8°F | Resp 16 | Wt 98.4 lb

## 2015-02-09 DIAGNOSIS — F200 Paranoid schizophrenia: Secondary | ICD-10-CM | POA: Diagnosis not present

## 2015-02-09 DIAGNOSIS — L719 Rosacea, unspecified: Secondary | ICD-10-CM | POA: Insufficient documentation

## 2015-02-09 DIAGNOSIS — C73 Malignant neoplasm of thyroid gland: Secondary | ICD-10-CM | POA: Insufficient documentation

## 2015-02-09 DIAGNOSIS — Z85850 Personal history of malignant neoplasm of thyroid: Secondary | ICD-10-CM | POA: Insufficient documentation

## 2015-02-09 MED ORDER — METRONIDAZOLE 0.75 % EX CREA: TOPICAL_CREAM | Freq: Two times a day (BID) | CUTANEOUS | Status: DC

## 2015-02-09 NOTE — Patient Instructions (Signed)
Rosacea Rosacea is a long-term (chronic) condition that affects the skin of the face, including the cheeks, nose, brow, and chin. This condition can also affect the eyes. Rosacea causes blood vessels near the surface of the skin to enlarge, which results in redness. CAUSES The cause of this condition is not known. Certain triggers can make rosacea worse, including:  Hot baths.  Exercise.  Sunlight.  Very hot or cold temperatures.  Hot or spicy foods and drinks.  Drinking alcohol.  Stress.  Taking blood pressure medicine.  Long-term use of topical steroids on the face. RISK FACTORS This condition is more likely to develop in:  People who are older than 52 years of age.  Women.  People who have light-colored skin (light complexion).  People who have a family history of rosacea. SYMPTOMS  Symptoms of this condition include:  Redness of the face.  Red bumps or pimples on the face.  A red, enlarged nose.  Blushing easily.  Red lines on the skin.  Irritated or burning feeling in the eyes.  Swollen eyelids. DIAGNOSIS This condition is diagnosed with a medical history and physical exam. TREATMENT There is no cure for this condition, but treatment can help to control your symptoms. Your health care provider may recommend that you see a skin specialist (dermatologist). Treatment may include:  Antibiotic medicines that are applied to the skin or taken as a pill.  Laser treatment to improve the appearance of the skin.  Surgery. This is rare. Your health care provider will also recommend the best way to take care of your skin. Even after your skin improves, you will likely need to continue treatment to prevent your rosacea from coming back. HOME CARE INSTRUCTIONS Skin Care Take care of your skin as told by your health care provider. You may be told to do these things:  Wash your skin gently two or more times each day.  Use mild soap.  Use a sunscreen or sunblock  with SPF 30 or greater.  Use gentle cosmetics that are meant for sensitive skin.  Shave with an electric shaver instead of a blade. Lifestyle  Try to keep track of what foods trigger this condition. Avoid any triggers. These may include:  Spicy foods.  Seafood.  Cheese.  Hot liquids.  Nuts.  Chocolate.  Iodized salt.  Do not drink alcohol.  Avoid extremely cold or hot temperatures.  Try to reduce your stress. If you need help, talk with your health care provider.  When you exercise, do these things to stay cool:  Limit your sun exposure.  Use a fan.  Do shorter and more frequent intervals of exercise. General Instructions   Keep all follow-up visits as told by your health care provider. This is important.  Take over-the-counter and prescription medicines only as told by your health care provider.  If your eyelids are affected, apply warm compresses to them. Do this as told by your health care provider.  If you were prescribed an antibiotic medicine, apply or take it as told by your health care provider. Do not stop using the antibiotic even if your condition improves. SEEK MEDICAL CARE IF:  Your symptoms get worse.  Your symptoms do not improve after two months of treatment.  You have new symptoms.  You have any changes in vision or you have problems with your eyes, such as redness or itching.  You feel depressed.  You lose your appetite.  You have trouble concentrating.   This information is not   intended to replace advice given to you by your health care provider. Make sure you discuss any questions you have with your health care provider.   Document Released: 04/21/2004 Document Revised: 12/03/2014 Document Reviewed: 05/21/2014 Elsevier Interactive Patient Education 2016 [employer]  

## 2015-02-09 NOTE — Progress Notes (Signed)
Patient: Diamond Lucas Female    DOB: [birth year]   54 y.o.   MRN: [id number] Visit Date: 02/09/2015  Today's Provider: Mar Daring, PA-C   Chief Complaint  Patient presents with  . itching in face   Subjective:    HPI Diamond Lucas is a 54 year old here today for concerns of having "worms" in her nose. Per patient the itching in her eyes and face persist. Just saw the allergists last week. Per patient allergist said patient didn't need any treatment. Patient states still needs something for the mites and worms on her nose because they are coming from the bats in the attic. Bats have been removed per patient. She also had been concerned about rabies from the bats and had been requesting rabies vaccination and rabies testing today.  Of note patient does have a past medical history of paranoid schizophrenia. She has not been taking her Risperdal as prescribed. She states that she has been off this for a long time. She was previously seen at [facility] psychiatry by Dr. Percival Spanish. She states that she was placed on the Risperdal during a time when she was homeless and it was felt that the homelessness was secondary to mental illness. She states that she does not think she is paranoid schizophrenic and only took the medication during that short time. She does not have any family locally and states that her closest relative is her mother who lives in [place]. She also mentions that her father recently passed away. He did live in [place] as well. She did not return for his funeral and has also been dealing with that. She mentions that the reason she did not go to her father's funeral was because she did not want to pass the "itchiness" to her mother. She feels that she has mites on her skin, in her eyes and in her nose. She felt that it may be contagious if she were to have traveled in that her mother would have gotten these. She does deny any suicidal or homicidal ideations.     Allergies  Allergen Reactions  . Diclofenac Sodium Palpitations  . Naproxen Itching, Rash and Hives  . Dust Mite Mixed Allergen Ext [Mite (D. Farinae)] Itching    Respiratory illness, and itchy eyes, eye problems  . Nsaids    Previous Medications   BACITRACIN-POLYMYXIN B (POLYSPORIN) OPHTHALMIC OINTMENT    Apply around both eyes TID.   CYCLOSPORINE (RESTASIS) 0.05 % OPHTHALMIC EMULSION    1 drop.   DOXYCYCLINE (PERIOSTAT) 20 MG TABLET    TK 1 T PO BID FOR ROSACEA FLARES   LEVOTHYROXINE (SYNTHROID, LEVOTHROID) 75 MCG TABLET    TAKE 1 TABLET BY MOUTH DAILY   MOMETASONE (NASONEX) 50 MCG/ACT NASAL SPRAY    Place into the nose.   MONTELUKAST (SINGULAIR) 10 MG TABLET    Take 1 tablet (10 mg total) by mouth at bedtime.   MULTIPLE VITAMINS-MINERALS (MULTIVITAMIN ADULT PO)    Take by mouth.   RISPERIDONE (RISPERDAL M-TABS) 2 MG DISINTEGRATING TABLET        Review of Systems  Constitutional: Negative.   HENT: Positive for rhinorrhea and sinus pressure. Negative for congestion, sneezing, sore throat and trouble swallowing.        Inside nose itching  Eyes: Positive for itching.  Respiratory: Negative.   Cardiovascular: Negative.   Neurological: Negative.   Psychiatric/Behavioral: Negative.  Negative for suicidal ideas and self-injury.  Social History  Substance Use Topics  . Smoking status: Never Smoker   . Smokeless tobacco: Never Used  . Alcohol Use: No   Objective:   BP 100/60 mmHg  Pulse 67  Temp(Src) 97.8 F (36.6 C) (Oral)  Resp 16  Wt 98 lb 6.4 oz (44.634 kg)  Physical Exam  Constitutional: She is oriented to person, place, and time. She appears well-developed and well-nourished. No distress.  HENT:  Head: Normocephalic and atraumatic.  Right Ear: Hearing, tympanic membrane, external ear and ear canal normal.  Left Ear: Hearing, tympanic membrane, external ear and ear canal normal.  Nose: Mucosal edema and rhinorrhea (clear) present. Right sinus exhibits no  maxillary sinus tenderness and no frontal sinus tenderness. Left sinus exhibits no maxillary sinus tenderness and no frontal sinus tenderness.  Mouth/Throat: Uvula is midline, oropharynx is clear and moist and mucous membranes are normal. No oropharyngeal exudate.  Eyes: Conjunctivae are normal. Pupils are equal, round, and reactive to light. Right eye exhibits no discharge. Left eye exhibits no discharge.  Neck: Normal range of motion. Neck supple. No tracheal deviation present. No thyromegaly present.  Cardiovascular: Normal rate, regular rhythm and normal heart sounds.  Exam reveals no gallop and no friction rub.   No murmur heard. Pulmonary/Chest: Effort normal and breath sounds normal. No respiratory distress. She has no wheezes. She has no rales.  Lymphadenopathy:    She has no cervical adenopathy.  Neurological: She is alert and oriented to person, place, and time.  Skin: She is not diaphoretic.  Psychiatric: She has a normal mood and affect. Her speech is normal. Judgment normal. She is slowed. Thought content is paranoid (feels bugs and mites on her; thought there were bats in the apartment (none were found)). Cognition and memory are normal. She expresses no homicidal and no suicidal ideation. She expresses no suicidal plans and no homicidal plans.  Vitals reviewed.       Assessment & Plan:     1. Paranoid schizophrenia ([place]) She has previously been seen by Dr. Percival Spanish at [facility] psychiatry. She states that she was trying to get an appointment with a psychiatrist locally here since she moved but has been unable to do so. She states that she was supposed to be seeing Dr. Nicolasa Ducking that when she called for an appointment they had quit taking Medicare insurance. She has previously been on Risperdal 2 mg but states that she has not taken this for a long time. I do feel that she is becoming more uncontrolled and feel that she does need a psychiatric eval as soon as possible. I have  spoken with our referral for negative urine office and am trying to get her an appointment as quickly as we can. I will also try to reach out to Dr. Percival Spanish to see if I can get any information sent over for her care. I did discuss that I felt she needed to start taking the Risperdal again and she disagrees. She does not feel that she was ever schizophrenic and that it was just a misdiagnosis during a bad patch of time in her life when she was homeless. She does however agreed to a psychiatric referral. She is to call the office if she has any worsening signs or symptoms or if she starts to develop any suicidal or homicidal ideations. - Ambulatory referral to Psychiatry  2. Rosacea She does state that she has been previously seen by dermatology and was diagnosed with rosacea secondary to  demodex mites. She states that she was treated with metronidazole cream for the rosacea. She is wishing to have another prescription for the metronidazole cream to use as needed when the rosacea worsens. Metronidazole cream was refilled as below. She is to call the office if she has any worsening symptoms. - metroNIDAZOLE (METROCREAM) 0.75 % cream; Apply topically 2 (two) times daily.  Dispense: 45 g; Refill: 0       Mar Daring, PA-C  [facility]

## 2015-02-23 ENCOUNTER — Telehealth: Payer: Self-pay | Admitting: Physician Assistant

## 2015-02-23 DIAGNOSIS — Z23 Encounter for immunization: Secondary | ICD-10-CM

## 2015-02-23 NOTE — Telephone Encounter (Signed)
Please review. Thanks!  

## 2015-02-23 NOTE — Telephone Encounter (Signed)
We do not have this vaccine here to give to her.  When I try to put it in electronically to send it states it will not be covered by her insurance.  Can we see if we can call this in to [facility] for her.  It would be for need for rabies prophylaxis diagnosis.  If needed I can write a Rx an fax to them if they need written. Thanks.

## 2015-02-23 NOTE — Telephone Encounter (Signed)
Pt states she is wanting to know if we give the vaccine Imovax,  if not pt states she has called  [facility] in [place] and they will give that vaccination to people and pt states that [facility] will give her vaccine only if her Dr. Lynnda Child write a prescription  for the vaccine for her to receive it there. Thanks CC

## 2015-02-23 NOTE — Telephone Encounter (Signed)
Left message to call back to verify which [facility] patient would like vaccine to be called in to.

## 2015-02-26 ENCOUNTER — Other Ambulatory Visit: Payer: Self-pay | Admitting: Physician Assistant

## 2015-02-26 NOTE — Telephone Encounter (Signed)
Please send into pharmacy. Thanks!

## 2015-03-02 NOTE — Telephone Encounter (Signed)
Diamond Lucas at [facility] is calling regarding a vaccine request for Rabies.  Her call back is [phone]  [facility]

## 2015-03-03 DIAGNOSIS — H52223 Regular astigmatism, bilateral: Secondary | ICD-10-CM | POA: Diagnosis not present

## 2015-03-03 DIAGNOSIS — H5213 Myopia, bilateral: Secondary | ICD-10-CM | POA: Diagnosis not present

## 2015-03-03 DIAGNOSIS — H1089 Other conjunctivitis: Secondary | ICD-10-CM | POA: Diagnosis not present

## 2015-03-03 NOTE — Telephone Encounter (Signed)
Spoke with Caryl Pina Warehouse manager) at [facility]. Per Caryl Pina ok to give the verbal order. (Need for rabies prophylaxis diagnosis.) was the order given as stated below.  Thanks,  -[name] R. RMA

## 2015-03-17 ENCOUNTER — Telehealth: Payer: Self-pay | Admitting: Physician Assistant

## 2015-03-17 NOTE — Telephone Encounter (Signed)
Patient is being bothered again by the "mites"   She says they are keeping her awake at night.  Crawing on her face around her nose and on her arms.  They are invisible.  She thinks she needs quell or something to help her,  Please call her back at [phone]  Thanks teri

## 2015-03-18 NOTE — Telephone Encounter (Signed)
Do you want me to schedule her for an appointment one of this days.  Thanks,  -[name]

## 2015-03-18 NOTE — Telephone Encounter (Signed)
Yes please

## 2015-03-19 ENCOUNTER — Ambulatory Visit (INDEPENDENT_AMBULATORY_CARE_PROVIDER_SITE_OTHER): Payer: Medicare Other | Admitting: Licensed Clinical Social Worker

## 2015-03-19 DIAGNOSIS — F200 Paranoid schizophrenia: Secondary | ICD-10-CM
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
## 2015-03-19 NOTE — Telephone Encounter (Signed)
Patient is scheduled for tomorrow. 12/23  Thanks,  Joniqua Sidle

## 2015-03-20 ENCOUNTER — Ambulatory Visit (INDEPENDENT_AMBULATORY_CARE_PROVIDER_SITE_OTHER): Payer: Medicare Other | Admitting: Physician Assistant

## 2015-03-20 ENCOUNTER — Encounter: Payer: Self-pay | Admitting: Physician Assistant

## 2015-03-20 VITALS — BP 100/70 | HR 63 | Temp 97.5°F | Resp 16 | Wt 102.6 lb

## 2015-03-20 DIAGNOSIS — F200 Paranoid schizophrenia: Secondary | ICD-10-CM

## 2015-03-20 DIAGNOSIS — W57XXXA Bitten or stung by nonvenomous insect and other nonvenomous arthropods, initial encounter: Secondary | ICD-10-CM | POA: Diagnosis not present

## 2015-03-20 DIAGNOSIS — T148 Other injury of unspecified body region: Secondary | ICD-10-CM | POA: Diagnosis not present

## 2015-03-20 MED ORDER — PERMETHRIN 5 % EX CREA: 1.0000 "application " | TOPICAL_CREAM | Freq: Once | CUTANEOUS | Status: DC

## 2015-03-20 NOTE — Progress Notes (Signed)
Patient: Diamond Lucas Female    DOB: December 28, 1960   54 y.o.   MRN: [id number] Visit Date: 03/20/2015  Today's Provider: Mar Daring, PA-C   Chief Complaint  Patient presents with  . Rash   Subjective:    Rash Chronicity: Patient has been seen for this before and states feels like there's more of a lot  bugs and mites living in her apartment. The problem is unchanged (Comes and goes). The affected locations include the left lower leg and right lowerleg (Patient states that yesterday she had something in her eyebrow (like hatching)). The rash is characterized by itchiness (Patient feels like this are worms in the skin. ). It is unknown if there was an exposure to a precipitant. Past treatments include antihistamine and anti-itch cream. The treatment provided no relief.  Patient states that she is not trying to blame anyone about all this living creatures in her apartment but feels like someone is trying to make a joke.  She does state that she did see a worm last night on her left posterior calf. This was an area near another bite mark. She states that it was under the skin and measure proximally 1 cm. She states that it was like in a semicircle shape it moved around where the bite area is and then she was not able to see any longer. She states she does have contacts now and she does not feel that she is seeing anything abnormally anymore. Patient got a Rabies Vaccine on 03/04/15 at the pharmacy.     Allergies  Allergen Reactions  . Diclofenac Sodium Palpitations  . Naproxen Itching, Rash and Hives  . Dust Mite Mixed Allergen Ext [Mite (D. Farinae)] Itching    Respiratory illness, and itchy eyes, eye problems  . Nsaids    Previous Medications   AZELAIC ACID 15 % CREAM    Apply topically. Reported on 03/20/2015   BACITRACIN-POLYMYXIN B (POLYSPORIN) OPHTHALMIC OINTMENT    Reported on 03/20/2015   CALCIUM CARBONATE-VIT D-MIN (CALCIUM 600+D PLUS MINERALS) 600-400 MG-UNIT  TABS    Take by mouth. Reported on 03/20/2015   CHOLECALCIFEROL (VITAMIN D3) 1000 UNITS CAPS    Take by mouth.   CYCLOSPORINE (RESTASIS) 0.05 % OPHTHALMIC EMULSION    1 drop.   ERYTHROMYCIN OPHTHALMIC OINTMENT    APPLY A SMALL AMOUNT TO EYELIDS AT BEDTIME FOR 2 WEEKS   LEVOTHYROXINE (SYNTHROID, LEVOTHROID) 75 MCG TABLET    TAKE 1 TABLET BY MOUTH DAILY   METRONIDAZOLE (METROCREAM) 0.75 % CREAM    Apply topically 2 (two) times daily.   MOMETASONE (NASONEX) 50 MCG/ACT NASAL SPRAY    Place into the nose.   MONTELUKAST (SINGULAIR) 10 MG TABLET    Take 1 tablet (10 mg total) by mouth at bedtime.   MULTIPLE VITAMINS-MINERALS (MULTIVITAMIN ADULT PO)    Take by mouth.   RANITIDINE (ZANTAC) 300 MG TABLET    Take by mouth. Reported on 03/20/2015   TRETINOIN (RETIN-A) 0.025 % GEL    Reported on 03/20/2015    Review of Systems  Constitutional: Negative.   HENT: Negative.   Eyes: Negative.   Respiratory: Negative.   Cardiovascular: Negative.   Gastrointestinal: Negative.   Endocrine: Negative.   Genitourinary: Negative.   Musculoskeletal: Negative.   Skin: Positive for rash.  Allergic/Immunologic: Negative.   Neurological: Negative.   Hematological: Negative.   Psychiatric/Behavioral: Negative.     Social History  Substance Use Topics  . Smoking status:  Never Smoker   . Smokeless tobacco: Never Used  . Alcohol Use: No   Objective:   BP 100/70 mmHg  Pulse 63  Temp(Src) 97.5 F (36.4 C) (Oral)  Resp 16  Wt 102 lb 9.6 oz (46.539 kg)  LMP 03/10/2015  Physical Exam  Constitutional: She appears well-developed and well-nourished. No distress.  Cardiovascular: Normal rate, regular rhythm and normal heart sounds.  Exam reveals no gallop and no friction rub.   No murmur heard. Pulmonary/Chest: Effort normal and breath sounds normal. No respiratory distress. She has no wheezes. She has no rales.  Skin: She is not diaphoretic.     Psychiatric: She has a normal mood and affect. Her speech  is normal and behavior is normal. Judgment normal. Thought content is paranoid. Cognition and memory are normal.  Vitals reviewed.       Assessment & Plan:     1. Bug bite I discussed giving her triamcinolone cream for the bug bites but she states she has some left from the previous time that I gave her the medication. She states she is scared this is either dust mites for scabies. I did explain to her that this was not consistent with scabies but that if she wished I would give her the cream that we use for scabies. She states that she does wish to use that as the triamcinolone cream will not kill off any larvae that may be in the bites. I will give her the permethrin cream as below to apply to the bite areas once daily for 2 weeks. She agrees and is happy with this decision. This way if it is scabies it will help and she also states that this medication would kill off larvae. She is to call the office if symptoms fail to improve or worsen. - permethrin (ELIMITE) 5 % cream; Apply 1 application topically once. Apply to bite areas once daily x 2 weeks  Dispense: 60 g; Refill: 0  2. Paranoid schizophrenia ([place]) I do feel that she is continuing to have symptoms of paranoid schizophrenia. She does have visible bug bites today on her lower extremity however she mentions that she feels "someone is playing a joke on her with the bugs or that it is like a terrorist attack." She is still very calm. She has no suicidal or homicidal ideations. I do not feel she is a threat to herself or others at this time. She did see a counselor yesterday and states that that didn't go well. They did discuss possibly needing medical management but nothing was done at that time. I did discuss with her about possibly restarting her Risperdal as she had done well with that in the past. She states that that was a long time ago and that she still does not feel that she needs this at this time. I did encourage her to continue to  follow-up with psychology and to call the office if she feels she needs anything.       Mar Daring, PA-C  [facility]

## 2015-03-26 ENCOUNTER — Telehealth: Payer: Self-pay | Admitting: Licensed Clinical Social Worker

## 2015-04-01 ENCOUNTER — Telehealth: Payer: Self-pay | Admitting: Physician Assistant

## 2015-04-01 ENCOUNTER — Ambulatory Visit (INDEPENDENT_AMBULATORY_CARE_PROVIDER_SITE_OTHER): Payer: Medicare Other | Admitting: Licensed Clinical Social Worker

## 2015-04-01 DIAGNOSIS — F200 Paranoid schizophrenia: Secondary | ICD-10-CM | POA: Diagnosis not present

## 2015-04-01 DIAGNOSIS — L989 Disorder of the skin and subcutaneous tissue, unspecified: Secondary | ICD-10-CM

## 2015-04-01 NOTE — Telephone Encounter (Signed)
Patient advised as directed below.  Thanks,  -[name] 

## 2015-04-01 NOTE — Telephone Encounter (Signed)
Pt called to ask if she should schedule an OV with Tawanna Sat or get a referral to a dermatologist to check for insect activity on her back and neck. Please advise. Thanks TNP

## 2015-04-01 NOTE — Telephone Encounter (Signed)
I will go ahead and refer her to dermatology so that she can have skin scrapings taken to be examined to see what is causing it.

## 2015-04-07 NOTE — Telephone Encounter (Signed)
Left messaging encouraging contact

## 2015-04-13 ENCOUNTER — Encounter: Payer: Self-pay | Admitting: Physician Assistant

## 2015-04-13 ENCOUNTER — Ambulatory Visit (INDEPENDENT_AMBULATORY_CARE_PROVIDER_SITE_OTHER): Payer: Medicare Other | Admitting: Physician Assistant

## 2015-04-13 VITALS — BP 102/84 | HR 72 | Temp 98.5°F | Resp 16 | Wt 98.4 lb

## 2015-04-13 DIAGNOSIS — F200 Paranoid schizophrenia: Secondary | ICD-10-CM

## 2015-04-13 DIAGNOSIS — L299 Pruritus, unspecified: Secondary | ICD-10-CM

## 2015-04-13 MED ORDER — TRIAMCINOLONE ACETONIDE 0.1 % EX CREA: 1.0000 "application " | TOPICAL_CREAM | Freq: Two times a day (BID) | CUTANEOUS | Status: DC

## 2015-04-13 NOTE — Progress Notes (Signed)
Patient: Diamond Lucas Female    DOB: [birth year]   55 y.o.   MRN: [id number] Visit Date: 04/13/2015  Today's Provider: Mar Daring, PA-C   Chief Complaint  Patient presents with  . Rash   Subjective:    Rash The problem is unchanged. The affected locations include the back. The rash is characterized by itchiness. She was exposed to an insect bite/sting. Associated symptoms include fatigue. Pertinent negatives include no fever. (Patient has a Dermatology appointment coming up March 2017) Past treatments include antihistamine, topical steroids, anti-itch cream and antibiotic cream.   She does mention also some what I believe to be hallucinations of 1) feeling like she had something crawling into her eyes and laying eggs. She elaborated on this and says that she feels that she may have cockroaches in her eyes and that she feels this may affect her vision and wants to see an eye doctor. 2) she stated that she felt something crawling up the right side and left-sided upper abdomen and went into her belly button and she did not know what it was. She also states that she did see a bug on her face at one point but she just flustered on the toilet instead of saving it to bring it. She mentions that she feels there are a lot of things in her carpet and that these things get on her and cause these rashes. She also stated to the medical assistant that sometimes she wonders if someone is just playing a couple joke on her and putting all of these bugs into her apartment or if it is a possible terrorist attack.  She does not voice any suicidal or homicidal ideations when asked. She states she is not a harm to herself and has no reason to be.    Allergies  Allergen Reactions  . Diclofenac Sodium Palpitations  . Naproxen Itching, Rash and Hives  . Dust Mite Mixed Allergen Ext [Mite (D. Farinae)] Itching    Respiratory illness, and itchy eyes, eye problems  . Nsaids    Previous  Medications   AZELAIC ACID 15 % CREAM    Apply topically. Reported on 03/20/2015   BACITRACIN-POLYMYXIN B (POLYSPORIN) OPHTHALMIC OINTMENT    Reported on 03/20/2015   CALCIUM CARBONATE-VIT D-MIN (CALCIUM 600+D PLUS MINERALS) 600-400 MG-UNIT TABS    Take by mouth. Reported on 03/20/2015   CHOLECALCIFEROL (VITAMIN D3) 1000 UNITS CAPS    Take by mouth.   CYCLOSPORINE (RESTASIS) 0.05 % OPHTHALMIC EMULSION    1 drop.   ERYTHROMYCIN OPHTHALMIC OINTMENT    APPLY A SMALL AMOUNT TO EYELIDS AT BEDTIME FOR 2 WEEKS   LEVOTHYROXINE (SYNTHROID, LEVOTHROID) 75 MCG TABLET    TAKE 1 TABLET BY MOUTH DAILY   METRONIDAZOLE (METROCREAM) 0.75 % CREAM    Apply topically 2 (two) times daily.   MOMETASONE (NASONEX) 50 MCG/ACT NASAL SPRAY    Place into the nose.   MONTELUKAST (SINGULAIR) 10 MG TABLET    Take 1 tablet (10 mg total) by mouth at bedtime.   MULTIPLE VITAMINS-MINERALS (MULTIVITAMIN ADULT PO)    Take by mouth.   PERMETHRIN (ELIMITE) 5 % CREAM    Apply 1 application topically once. Apply to bite areas once daily x 2 weeks   RANITIDINE (ZANTAC) 300 MG TABLET    Take by mouth. Reported on 03/20/2015   TRETINOIN (RETIN-A) 0.025 % GEL    Reported on 03/20/2015    Review of Systems  Constitutional: Positive for  fatigue. Negative for fever.  HENT: Negative.   Eyes: Positive for itching.  Respiratory: Negative.   Cardiovascular: Negative.   Gastrointestinal: Negative.   Endocrine: Negative.   Genitourinary: Negative.   Musculoskeletal: Negative.   Skin: Positive for rash.  Allergic/Immunologic: Positive for environmental allergies.  Neurological: Negative.   Hematological: Negative.   Psychiatric/Behavioral: Positive for hallucinations. Negative for suicidal ideas and self-injury.  All other systems reviewed and are negative.   Social History  Substance Use Topics  . Smoking status: Never Smoker   . Smokeless tobacco: Never Used  . Alcohol Use: No   Objective:   BP 102/84 mmHg  Pulse 72   Temp(Src) 98.5 F (36.9 C) (Oral)  Resp 16  Wt 98 lb 6.4 oz (44.634 kg)  LMP 04/10/2015  Physical Exam  Constitutional: She appears well-developed and well-nourished. No distress.  HENT:  Head: Normocephalic and atraumatic.  Cardiovascular: Normal rate, regular rhythm and normal heart sounds.  Exam reveals no gallop and no friction rub.   No murmur heard. Pulmonary/Chest: Effort normal and breath sounds normal. No respiratory distress. She has no wheezes. She has no rales.  Skin: Skin is warm and dry. She is not diaphoretic.     Vitals reviewed.       Assessment & Plan:     1. Itch of skin Will give triamcinolone cream to see if that helps the dryness and itching that she has on her back that is aggravating her. She is to call the office if there is no improvement or if symptoms worsen. - triamcinolone cream (KENALOG) 0.1 %; Apply 1 application topically 2 (two) times daily.  Dispense: 30 g; Refill: 0  2. Paranoid schizophrenia ([place] ) She has been seen by behavioral health once and was able to see a counselor. She did call them one other time after hours but did not call back. She is scheduled to see her counselor, Royal Piedra, on January 18. I did discuss with her again in the office that was concerned that a lot of her symptoms were stemming from thoughts and anxiety related to her history of paranoid schizophrenia and were not truly problems. She stated that she didn't really see the importance of following up with them because that was not on a help solve her bug issues. She did voice that she would continue her follow-ups with them as scheduled. She also has an appointment already scheduled with Dr. Gretel Acre on February 16. I will call to see if there is any possible way that this appointment can be moved up. I do feel that her hallucinations are becoming more prominent and more frequent. She has not yet harmed herself but am concerned that because she lives alone of what she may do  if she does feel that there is a bug on her something crawling into her as she has described today. She did mention to me as she was walking out today that for me to not be offended if she were to go to the ER for these issues of the bugs. I do feel that she does need medical management for her psychiatric issues for better control. She has previously been on Risperdal and was seemingly well controlled. I am hopeful that we will be able to get her better psychiatric treatment sooner rather than later. She is to call the office if she has any other what I believe to be hallucinations with bugs and parasites.       Mar Daring, PA-C  [facility]

## 2015-04-15 ENCOUNTER — Ambulatory Visit (INDEPENDENT_AMBULATORY_CARE_PROVIDER_SITE_OTHER): Payer: Medicare Other | Admitting: Licensed Clinical Social Worker

## 2015-04-15 DIAGNOSIS — F200 Paranoid schizophrenia: Secondary | ICD-10-CM | POA: Diagnosis not present

## 2015-04-15 NOTE — Progress Notes (Signed)
   THERAPIST PROGRESS NOTE  Session Time: 48min  Participation Level: Active  Behavioral Response: CasualAlertIrritable  Type of Therapy: Individual Therapy  Treatment Goals addressed: Coping and Diagnosis: Paranoia  Interventions: CBT, Motivational Interviewing, Solution Focused, Strength-based, Supportive, Family Systems and Reframing  Summary: Diamond Lucas is a 55 y.o. female who presents with continued symptoms of her diagnosis.  Explored current stressors such as: bats & roaches in her apartment and mites/lice on her body & hair.  Encouraged her to make appointment to her Primary Care Physician.  She denies having a PCP.  Discussion of her referral source to this agency.  Provided her with a list of Physician's in the area  Discussion of her history of mental health medication and her upcoming appointment with Dr. Gretel Acre.  Informed her of services in the area as OPT may not be a good fit for her without medication.  Patient was hesitant about attending Psychiatrist appointment stating "I am comfortable and happy talking with you."   Discussion of continued negative & irrational thinking.  Encouraged her to contact this office or her PCP if her mood or thoughts become aggressive toward self.  Suicidal/Homicidal: Nowithout intent/plan  Therapist Response: LCSW provided Patient with ongoing emotional support and encouragement.  Normalized her feelings.  Commended Patient on her progress and reinforced the importance of client staying focused on her own strengths and resources and resiliency. Processed various strategies for dealing with stressors.    Plan: Return again in 2 weeks.  Diagnosis: Axis I: Paranoid Schizophrenia    Axis II: No diagnosis    Lubertha South 04/15/2015

## 2015-04-22 NOTE — Progress Notes (Signed)
   THERAPIST PROGRESS NOTE  Session Time: 67min  Participation Level: Active  Behavioral Response: CasualAlertHopeless  Type of Therapy: Individual Therapy  Treatment Goals addressed: Coping  Interventions: CBT, Motivational Interviewing, Solution Focused, Supportive and Reframing  Summary: Diamond Lucas is a 55 y.o. female who presents with continued symptoms of her diagnosis.  She continues to believe that bugs, mites, bats, worms and rats are invading her body and her apartment.  She reports contacting the police regarding the pests being in her apartment but she was unclear on the results.  She states having appointments at her Primary Care Physician to discuss the pests on her body.  Explored her thoughts on medication management.  She reports that she is struggling with her financial responsibilities monthly.  She states that she has not paid rent in over a month.  Discussion of community agencies that may assist with financial concerns.  Suicidal/Homicidal: Nowithout intent/plan  Therapist Response: LCSW provided Patient with ongoing emotional support and encouragement.  Normalized her feelings.  Commended Patient on her progress and reinforced the importance of client staying focused on her own strengths and resources and resiliency. Processed various strategies for dealing with stressors.    Plan: Return again in 2 weeks.  Diagnosis: Axis I: Paranoid Schizophrenia    Axis II: No diagnosis    Lubertha South 04/15/2015

## 2015-05-07 ENCOUNTER — Ambulatory Visit: Payer: Medicare Other | Admitting: Psychiatry

## 2015-05-12 ENCOUNTER — Encounter: Payer: Self-pay | Admitting: Physician Assistant

## 2015-05-12 ENCOUNTER — Ambulatory Visit (INDEPENDENT_AMBULATORY_CARE_PROVIDER_SITE_OTHER): Payer: Medicare Other | Admitting: Physician Assistant

## 2015-05-12 VITALS — BP 100/70 | HR 72 | Temp 98.1°F | Resp 16 | Wt 95.6 lb

## 2015-05-12 DIAGNOSIS — R8299 Other abnormal findings in urine: Secondary | ICD-10-CM | POA: Diagnosis not present

## 2015-05-12 DIAGNOSIS — R82998 Other abnormal findings in urine: Secondary | ICD-10-CM

## 2015-05-12 LAB — POCT URINALYSIS DIPSTICK
Bilirubin, UA: NEGATIVE
Glucose, UA: NEGATIVE
Ketones, UA: NEGATIVE
Leukocytes, UA: NEGATIVE
Nitrite, UA: NEGATIVE
PROTEIN UA: NEGATIVE
RBC UA: NEGATIVE
SPEC GRAV UA: 1.01
UROBILINOGEN UA: 0.2
pH, UA: 7

## 2015-05-12 LAB — POCT URINE PREGNANCY: PREG TEST UR: NEGATIVE

## 2015-05-12 NOTE — Progress Notes (Signed)
Patient: Diamond Lucas Female    DOB: [birth year]   55 y.o.   MRN: [id number] Visit Date: 05/12/2015  Today's Provider: Mar Daring, PA-C   Chief Complaint  Patient presents with  . Color in Urine   Subjective:    HPI  Diamond Lucas is here today concern about her urine having a dark color.  She also states that someone told her that they saw 2 men come into her apartment and ejaculate in different places and that they could see the sperm left in the room that wasn't supposed to be there. She is concerned about being pregnant or having a UTI and wants to get tested. No painful urination, hematuria, no burning, no UTI symptoms. She states she is not sexually active.    Allergies  Allergen Reactions  . Diclofenac Sodium Palpitations  . Naproxen Itching, Rash and Hives  . Dust Mite Mixed Allergen Ext [Mite (D. Farinae)] Itching    Respiratory illness, and itchy eyes, eye problems  . Nsaids    Previous Medications   AZELAIC ACID 15 % CREAM    Apply topically. Reported on 03/20/2015   BACITRACIN-POLYMYXIN B (POLYSPORIN) OPHTHALMIC OINTMENT    Reported on 03/20/2015   CALCIUM CARBONATE-VIT D-MIN (CALCIUM 600+D PLUS MINERALS) 600-400 MG-UNIT TABS    Take by mouth. Reported on 03/20/2015   CHOLECALCIFEROL (VITAMIN D3) 1000 UNITS CAPS    Take by mouth.   CYCLOSPORINE (RESTASIS) 0.05 % OPHTHALMIC EMULSION    1 drop.   ERYTHROMYCIN OPHTHALMIC OINTMENT    APPLY A SMALL AMOUNT TO EYELIDS AT BEDTIME FOR 2 WEEKS   LEVOTHYROXINE (SYNTHROID, LEVOTHROID) 75 MCG TABLET    TAKE 1 TABLET BY MOUTH DAILY   METRONIDAZOLE (METROCREAM) 0.75 % CREAM    Apply topically 2 (two) times daily.   MOMETASONE (NASONEX) 50 MCG/ACT NASAL SPRAY    Place into the nose.   MONTELUKAST (SINGULAIR) 10 MG TABLET    Take 1 tablet (10 mg total) by mouth at bedtime.   MULTIPLE VITAMINS-MINERALS (MULTIVITAMIN ADULT PO)    Take by mouth.   PERMETHRIN (ELIMITE) 5 % CREAM    Apply 1 application topically  once. Apply to bite areas once daily x 2 weeks   RANITIDINE (ZANTAC) 300 MG TABLET    Take by mouth. Reported on 03/20/2015   TRETINOIN (RETIN-A) 0.025 % GEL    Reported on 03/20/2015   TRIAMCINOLONE CREAM (KENALOG) 0.1 %    Apply 1 application topically 2 (two) times daily.    Review of Systems  Constitutional: Negative for fever, chills and fatigue.  Respiratory: Negative.   Cardiovascular: Negative.   Gastrointestinal: Negative for nausea, vomiting and abdominal pain.  Genitourinary: Negative for dysuria, urgency, frequency, hematuria, flank pain, vaginal bleeding, vaginal discharge, vaginal pain and pelvic pain.  Musculoskeletal: Negative.     Social History  Substance Use Topics  . Smoking status: Never Smoker   . Smokeless tobacco: Never Used  . Alcohol Use: No   Objective:   BP 100/70 mmHg  Pulse 72  Temp(Src) 98.1 F (36.7 C) (Oral)  Resp 16  Wt 95 lb 9.6 oz (43.364 kg)  LMP 04/10/2015  Physical Exam  Constitutional: She is oriented to person, place, and time. She appears well-developed and well-nourished. No distress.  Cardiovascular: Normal rate, regular rhythm and normal heart sounds.  Exam reveals no gallop and no friction rub.   No murmur heard. Pulmonary/Chest: Effort normal and breath sounds normal. No respiratory distress.  She has no wheezes. She has no rales.  Abdominal: Soft. Normal appearance and bowel sounds are normal. She exhibits no distension and no mass. There is no hepatosplenomegaly. There is no tenderness. There is no rebound, no guarding and no CVA tenderness.  Neurological: She is alert and oriented to person, place, and time.  Skin: Skin is warm and dry. She is not diaphoretic.        Assessment & Plan:     1. Dark urine All testing was negative and urine was clear. We did discuss that most likely she would need to have sexual intercourse to become pregnant. She voiced understanding. She does have an appointment with Dr. Gretel Acre on February  16. She does state that she is going to follow through with this appointment. I will contact her following her appointment to see how it went and make sure that they had a good rapport with each other. We did also discuss having her thyroid rechecked. She states that she does see her endocrinologist (Dr. Tod Persia in [facility])  in April for her yearly follow-up of this. She is to call the office if she has any acute issues in the meantime. - POCT Urinalysis Dipstick - POCT urine pregnancy       Mar Daring, PA-C  [place]

## 2015-05-14 ENCOUNTER — Ambulatory Visit: Payer: Medicare Other | Admitting: Psychiatry

## 2015-05-14 ENCOUNTER — Ambulatory Visit (INDEPENDENT_AMBULATORY_CARE_PROVIDER_SITE_OTHER): Payer: Medicare Other | Admitting: Licensed Clinical Social Worker

## 2015-05-14 DIAGNOSIS — F200 Paranoid schizophrenia: Secondary | ICD-10-CM | POA: Diagnosis not present

## 2015-05-21 ENCOUNTER — Ambulatory Visit (INDEPENDENT_AMBULATORY_CARE_PROVIDER_SITE_OTHER): Payer: Medicare Other | Admitting: Psychiatry

## 2015-05-21 ENCOUNTER — Encounter: Payer: Self-pay | Admitting: Psychiatry

## 2015-05-21 DIAGNOSIS — F259 Schizoaffective disorder, unspecified: Secondary | ICD-10-CM

## 2015-05-21 NOTE — Progress Notes (Signed)
Psychiatric Initial Adult Assessment   Patient Identification: Diamond Lucas MRN:  [id number] Date of Evaluation:  05/21/2015 Referral Source: Royal Piedra Chief Complaint:   Chief Complaint    Establish Care; Schizophrenia     Visit Diagnosis: No diagnosis found. Diagnosis:   Patient Active Problem List   Diagnosis Date Noted  . Personal history of malignant neoplasm of thyroid [Z85.850] 02/09/2015  . Rosacea [L71.9] 02/09/2015  . Rhinitis, allergic [J30.9] 12/26/2014  . BP (high blood pressure) [I10] 11/14/2014  . 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 Paranoid schizophrenia ([place]) [F20.0] 05/23/2014  . H/O thyroidectomy [E89.0] 05/23/2014  . 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 Reflux [K21.9] 09/26/2012   History of Present Illness:   Patient is a 55 year old Caucasian woman with a previous diagnosis of paranoid schizophrenia who was referred by her therapist Royal Piedra to establish care and for evaluation and medication management . Patient reports that she has been doing well for the last few days. She does report getting depressed and states that on the scale of 03-1008 being her best mood and one her lowest she is at a 5 most of the time. She reports that she has had felt like bombs coming out of her nose in the past and that happens intermittently. States that she hasn't had that feeling this morning. She does continue to have some mites in her body and states that has gotten better as well. She reports sleeping well. States that she eats depending on how much food stamps she gets. States that she is renting an apartment and lives on her own. 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 She denies hearing voices or seeing things. She denies any particular special powers. She denies any suicidal thoughts. She denies use of  alcohol or other substances.  Patient in the past reports that she had bats in her attic and felt like they were infecting her. Patient had other bizarre thought processes that she had shared with other physicians and the therapist. She however denies that she needs to be on any antipsychotic medication.    Associated Signs/Symptoms: Depression Symptoms:  depressed mood, psychomotor agitation, (Hypo) Manic Symptoms:  denies Anxiety Symptoms:  Excessive Worry, Psychotic Symptoms:  Delusions, PTSD Symptoms: denies  Past Medical History:  Past Medical History  Diagnosis Date  . Osteoarthritis   . Allergy   . History of syncope   . History of chicken pox   . History of measles   . Rosacea   . Arrhythmia   . Thyroid disease   . Schizophrenia [facility])     Past Surgical History  Procedure Laterality Date  . Thyroidectomy    . Foot surgery Right    Family History:  Family History  Problem Relation Age of Onset  . Depression Mother   . Cataracts Mother   . Cancer Other   . Heart failure Other   . Heart disease Other   . Stroke Other   . Diabetes Other   . Kidney cancer Father   . Anxiety disorder Brother    Social History:   Social History   Social History  . Marital Status: Single    Spouse Name: N/A  . Number of Children: N/A  . Years of Education: N/A   Social History Main Topics  . Smoking status: Never Smoker   . Smokeless tobacco: Never Used  .  Alcohol Use: No  . Drug Use: No  . Sexual Activity: Not Currently   Other Topics Concern  . None   Social History Narrative   Additional Social History:   Musculoskeletal: Strength & Muscle Tone: within normal limits Gait & Station: normal Patient leans: N/A  Psychiatric Specialty Exam: HPI  ROS  There were no vitals taken for this visit.There is no weight on file to calculate BMI.  General Appearance: Casual  Eye Contact:  Fair  Speech:  Clear and Coherent  Volume:  Normal  Mood:  Dysphoric  Affect:   Congruent  Thought Process:  Circumstantial  Orientation:  Full (Time, Place, and Person)  Thought Content:  Delusions, Ilusions and Paranoid Ideation  Suicidal Thoughts:  No  Homicidal Thoughts:  No  Memory:  Immediate;   Fair Recent;   Fair Remote;   Fair  Judgement:  Impaired  Insight:  Lacking  Psychomotor Activity:  Normal  Concentration:  Fair  Recall:  [employer] of Knowledge:Fair  Language: Fair  Akathisia:  No  Handed:  Right  AIMS (if indicated):  na  Assets:  Communication Skills Desire for Improvement Housing  ADL's:  Intact  Cognition: WNL  Sleep:  fair   Is the patient at risk to self?  No. Has the patient been a risk to self in the past 6 months?  No. Has the patient been a risk to self within the distant past?  No. Is the patient a risk to others?  No. Has the patient been a risk to others in the past 6 months?  No. Has the patient been a risk to others within the distant past?  No.  Allergies:   Allergies  Allergen Reactions  . Diclofenac Sodium Palpitations  . Naproxen Itching, Rash and Hives  . Dust Mite Mixed Allergen Ext [Mite (D. Farinae)] Itching    Respiratory illness, and itchy eyes, eye problems  . Nsaids    Current Medications: Current Outpatient Prescriptions  Medication Sig Dispense Refill  . Azelaic Acid 15 % cream Apply topically. Reported on 03/20/2015    . bacitracin-polymyxin b (POLYSPORIN) ophthalmic ointment Reported on 03/20/2015  0  . Calcium Carbonate-Vit D-Min (CALCIUM 600+D PLUS MINERALS) 600-400 MG-UNIT TABS Take by mouth. Reported on 03/20/2015    . Cholecalciferol (VITAMIN D3) 1000 UNITS CAPS Take by mouth.    . cycloSPORINE (RESTASIS) 0.05 % ophthalmic emulsion 1 drop.    Marland Kitchen erythromycin ophthalmic ointment APPLY A SMALL AMOUNT TO EYELIDS AT BEDTIME FOR 2 WEEKS  0  . levothyroxine (SYNTHROID, LEVOTHROID) 75 MCG tablet TAKE 1 TABLET BY MOUTH DAILY    . metroNIDAZOLE (METROCREAM) 0.75 % cream Apply topically 2 (two) times  daily. 45 g 0  . montelukast (SINGULAIR) 10 MG tablet Take 1 tablet (10 mg total) by mouth at bedtime. 30 tablet 1  . Multiple Vitamins-Minerals (MULTIVITAMIN ADULT PO) Take by mouth.    . permethrin (ELIMITE) 5 % cream Apply 1 application topically once. Apply to bite areas once daily x 2 weeks 60 g 0  . tretinoin (RETIN-A) 0.025 % gel Reported on 03/20/2015    . triamcinolone cream (KENALOG) 0.1 % Apply 1 application topically 2 (two) times daily. 30 g 0  . mometasone (NASONEX) 50 MCG/ACT nasal spray Place into the nose.    . ranitidine (ZANTAC) 300 MG tablet Take by mouth. Reported on 05/21/2015     No current facility-administered medications for this visit.    Previous Psychotropic Medications: Yes  Substance Abuse History in the last 12 months:  No.  Consequences of Substance Abuse: Negative  Medical Decision Making:  Established Problem, Stable/Improving (1), Review of Psycho-Social Stressors (1), Review and summation of old records (2), Review of Medication Regimen & Side Effects (2) and Review of New Medication or Change in Dosage (2)  Treatment Plan Summary: Medication management   Paranoid schizophrenia Does not acknowledge this diagnosis in spite of the presenting with several paranoid symptoms. She is functioning well and she is okay willing to come back in a month's time.  Depression Discussed starting Zoloft at 25 mg once daily. Patient would like to look at the side effects and she will decide whether she wants to take it at her next appointment.  RTC clinic in 1 month's time or call before if necessary    Sheral Pfahler 2/23/201711:18 AM

## 2015-05-26 NOTE — Progress Notes (Signed)
   THERAPIST PROGRESS NOTE  Session Time: 98min  Participation Level: Active  Behavioral Response: Casual and NeatAlertDepressed  Type of Therapy: Individual Therapy  Treatment Goals addressed: Coping and Diagnosis: Schizophrenia  Interventions: CBT, Motivational Interviewing, Solution Focused, Supportive and Reframing  Summary: Diamond Lucas is a 55 y.o. female who presents with continued symptoms of her diagnosis.  Therapist assessed mood. Therapist discussed with client her progress. Therapist review each goal with client and asked Client to rate progress.  Therapist assisted client with processing her current plan about her medication regimen.  Therapist inquired about living conditions, financial assistance and medical concerns.  Therapist role played on how to search for a housing.  Therapist recited a few mock questions and encouraged Patient to obtain assistance if mites and rats continue to be in her apartment with pest control being available..  Therapist assessed progress toward goal.  Discussion of Therapist going on [place] and provided patient with a list of therapist in the area..   Suicidal/Homicidal: Nowithout intent/plan  Therapist Response:LCSW provided Patient with ongoing emotional support and encouragement.  Normalized her feelings.  Commended Patient on her progress and reinforced the importance of client staying focused on her own strengths and resources and resiliency. Processed various strategies for dealing with stressors.    Plan:  Discussion of Therapist going on [place] and provided patient with a list of therapist in the area..  Diagnosis: Axis I: Paranoid Schizophrenia    Axis II: No diagnosis    Lubertha South 05/14/2015

## 2015-06-18 ENCOUNTER — Ambulatory Visit: Payer: Medicare Other | Admitting: Psychiatry

## 2015-06-18 ENCOUNTER — Encounter: Payer: Self-pay | Admitting: Psychiatry

## 2015-06-18 VITALS — BP 110/68 | HR 62 | Temp 96.5°F | Ht 61.5 in | Wt 94.6 lb

## 2015-06-18 DIAGNOSIS — F200 Paranoid schizophrenia: Secondary | ICD-10-CM

## 2015-06-18 NOTE — Progress Notes (Signed)
Patient ID: Diamond Lucas, female   DOB: 05-28-1960, 55 y.o.   MRN: [id number]  Psychiatric Progress Note  Patient Identification: Diamond Lucas MRN:  [id number] Date of Evaluation:  06/18/2015 Chief Complaint:  Doing okay Chief Complaint    Follow-up; Medication Refill     Visit Diagnosis: No diagnosis found. Diagnosis:   Patient Active Problem List   Diagnosis Date Noted  . Personal history of malignant neoplasm of thyroid [Z85.850] 02/09/2015  . Rosacea [L71.9] 02/09/2015  . Rhinitis, allergic [J30.9] 12/26/2014  . BP (high blood pressure) [I10] 11/14/2014  . 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 Paranoid schizophrenia ([place]) [F20.0] 05/23/2014  . H/O thyroidectomy [E89.0] 05/23/2014  . 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 Reflux [K21.9] 09/26/2012   History of Present Illness:   Patient is a 55 year old Caucasian woman with a previous diagnosis of paranoid schizophrenia who was Seen previously for an evaluation comes in today for a follow-up visit. At her previous visit patient had not acknowledge that she had a problem with the paranoia and wanted to wait to start the Zoloft. She states that her problems would not go away with the Zoloft. States she feels like mites are coming out of her nose and she is seeing a dermatologist later this month. She denies any suicidal thoughts. She denies use of alcohol or other substances.  Patient in the past reports that she had bats in her attic and felt like they were infecting her. Today she states there are bats in her attic and she is not sure if they are infecting though.  Patient had other bizarre thought processes that she had shared with other physicians and the therapist. She however denies that she needs to be on any antipsychotic medication.  Past Medical History:  Past Medical History  Diagnosis Date  . Osteoarthritis   . Allergy   . History of  syncope   . History of chicken pox   . History of measles   . Rosacea   . Arrhythmia   . Thyroid disease   . Schizophrenia [facility])     Past Surgical History  Procedure Laterality Date  . Thyroidectomy    . Foot surgery Right    Family History:  Family History  Problem Relation Age of Onset  . Depression Mother   . Cataracts Mother   . Cancer Other   . Heart failure Other   . Heart disease Other   . Stroke Other   . Diabetes Other   . Kidney cancer Father   . Anxiety disorder Brother    Social History:   Social History   Social History  . Marital Status: Single    Spouse Name: N/A  . Number of Children: N/A  . Years of Education: N/A   Social History Main Topics  . Smoking status: Never Smoker   . Smokeless tobacco: Never Used  . Alcohol Use: No  . Drug Use: No  . Sexual Activity: Not Currently   Other Topics Concern  . None   Social History Narrative   Additional Social History:   Musculoskeletal: Strength & Muscle Tone: within normal limits Gait & Station: normal Patient leans: N/A  Psychiatric Specialty Exam: HPI  ROS  Blood pressure 110/68, pulse 62, temperature 96.5 F (35.8 C), temperature source Tympanic, height 5' 1.5" (1.562 m), weight 94 lb 9.6 oz (42.91 kg), last menstrual period 04/10/2015, SpO2 98 %.Body mass index is 17.59 kg/(m^2).  General Appearance: Casual  Eye Contact:  Fair  Speech:  Clear and Coherent  Volume:  Normal  Mood:  brighter  Affect:  Congruent  Thought Process:  Circumstantial  Orientation:  Full (Time, Place, and Person)  Thought Content:  Delusions, Ilusions and Paranoid Ideation  Suicidal Thoughts:  No  Homicidal Thoughts:  No  Memory:  Immediate;   Fair Recent;   Fair Remote;   Fair  Judgement:  Impaired  Insight:  Lacking  Psychomotor Activity:  Normal  Concentration:  Fair  Recall:  [employer] of Knowledge:Fair  Language: Fair  Akathisia:  No  Handed:  Right  AIMS (if indicated):  na  Assets:   Communication Skills Desire for Improvement Housing  ADL's:  Intact  Cognition: WNL  Sleep:  fair   Is the patient at risk to self?  No. Has the patient been a risk to self in the past 6 months?  No. Has the patient been a risk to self within the distant past?  No. Is the patient a risk to others?  No. Has the patient been a risk to others in the past 6 months?  No. Has the patient been a risk to others within the distant past?  No.  Allergies:   Allergies  Allergen Reactions  . Diclofenac Sodium Palpitations  . Naproxen Itching, Rash and Hives  . Dust Mite Mixed Allergen Ext [Mite (D. Farinae)] Itching    Respiratory illness, and itchy eyes, eye problems  . Nsaids    Current Medications: Current Outpatient Prescriptions  Medication Sig Dispense Refill  . Azelaic Acid 15 % cream Apply topically. Reported on 06/18/2015    . bacitracin-polymyxin b (POLYSPORIN) ophthalmic ointment Reported on 03/20/2015  0  . Calcium Carbonate-Vit D-Min (CALCIUM 600+D PLUS MINERALS) 600-400 MG-UNIT TABS Take by mouth. Reported on 03/20/2015    . Cholecalciferol (VITAMIN D3) 1000 UNITS CAPS Take by mouth.    . cycloSPORINE (RESTASIS) 0.05 % ophthalmic emulsion 1 drop.    Marland Kitchen erythromycin ophthalmic ointment APPLY A SMALL AMOUNT TO EYELIDS AT BEDTIME FOR 2 WEEKS  0  . levothyroxine (SYNTHROID, LEVOTHROID) 75 MCG tablet TAKE 1 TABLET BY MOUTH DAILY    . metroNIDAZOLE (METROCREAM) 0.75 % cream Apply topically 2 (two) times daily. 45 g 0  . Multiple Vitamins-Minerals (MULTIVITAMIN ADULT PO) Take by mouth.    . tretinoin (RETIN-A) 0.025 % gel Reported on 03/20/2015    . mometasone (NASONEX) 50 MCG/ACT nasal spray Place into the nose.     No current facility-administered medications for this visit.    Previous Psychotropic Medications: Yes   Substance Abuse History in the last 12 months:  No.  Consequences of Substance Abuse: Negative  Medical Decision Making:  Established Problem, Stable/Improving  (1), Review of Psycho-Social Stressors (1), Review and summation of old records (2), Review of Medication Regimen & Side Effects (2) and Review of New Medication or Change in Dosage (2)  Treatment Plan Summary: Medication management   Paranoid schizophrenia Does not acknowledge this diagnosis in spite of the presenting with several paranoid symptoms. She is functioning well and able to manage with her ADL`s.  Depression Patient is not interested in obtaining any treatment.  RTC clinic in 3 month's time or call before if necessary    Jedediah Noda 3/23/201711:04 AM

## 2015-06-22 DIAGNOSIS — R21 Rash and other nonspecific skin eruption: Secondary | ICD-10-CM | POA: Diagnosis not present

## 2015-06-22 DIAGNOSIS — D229 Melanocytic nevi, unspecified: Secondary | ICD-10-CM | POA: Diagnosis not present

## 2015-06-22 DIAGNOSIS — L719 Rosacea, unspecified: Secondary | ICD-10-CM | POA: Diagnosis not present

## 2015-06-26 ENCOUNTER — Ambulatory Visit (INDEPENDENT_AMBULATORY_CARE_PROVIDER_SITE_OTHER): Payer: Medicare Other | Admitting: Physician Assistant

## 2015-06-26 ENCOUNTER — Encounter: Payer: Self-pay | Admitting: Physician Assistant

## 2015-06-26 VITALS — BP 100/70 | HR 70 | Temp 97.8°F | Resp 16 | Wt 97.2 lb

## 2015-06-26 DIAGNOSIS — H938X3 Other specified disorders of ear, bilateral: Secondary | ICD-10-CM | POA: Diagnosis not present

## 2015-06-26 DIAGNOSIS — F200 Paranoid schizophrenia: Secondary | ICD-10-CM | POA: Diagnosis not present
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
## 2015-07-13 DIAGNOSIS — M3501 Sicca syndrome with keratoconjunctivitis: Secondary | ICD-10-CM | POA: Diagnosis not present

## 2015-07-17 ENCOUNTER — Ambulatory Visit (INDEPENDENT_AMBULATORY_CARE_PROVIDER_SITE_OTHER): Payer: Medicare Other | Admitting: Physician Assistant

## 2015-07-17 ENCOUNTER — Encounter: Payer: Self-pay | Admitting: Physician Assistant

## 2015-07-17 VITALS — BP 110/70 | HR 73 | Temp 97.4°F | Resp 16 | Wt 96.0 lb

## 2015-07-17 DIAGNOSIS — F200 Paranoid schizophrenia: Secondary | ICD-10-CM | POA: Diagnosis not present

## 2015-07-17 DIAGNOSIS — R1011 Right upper quadrant pain: Secondary | ICD-10-CM | POA: Diagnosis not present

## 2015-07-17 DIAGNOSIS — R1030 Lower abdominal pain, unspecified: Secondary | ICD-10-CM

## 2015-07-17 DIAGNOSIS — M25551 Pain in right hip: Secondary | ICD-10-CM | POA: Diagnosis not present

## 2015-07-17 DIAGNOSIS — E890 Postprocedural hypothyroidism: Secondary | ICD-10-CM | POA: Diagnosis not present

## 2015-07-17 LAB — POCT URINALYSIS DIPSTICK
Bilirubin, UA: NEGATIVE
GLUCOSE UA: NEGATIVE
Ketones, UA: NEGATIVE
LEUKOCYTES UA: NEGATIVE
NITRITE UA: NEGATIVE
PH UA: 6
Protein, UA: NEGATIVE
RBC UA: NEGATIVE
Spec Grav, UA: <=1.005
UROBILINOGEN UA: 0.2
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
## 2015-07-17 NOTE — Patient Instructions (Signed)

## 2015-07-18 LAB — CBC WITH DIFFERENTIAL/PLATELET
BASOS: 0 %
Basophils Absolute: 0 10*3/uL (ref 0.0–0.2)
EOS (ABSOLUTE): 0.2 10*3/uL (ref 0.0–0.4)
Eos: 2 %
Hematocrit: 41.3 % (ref 34.0–46.6)
Hemoglobin: 13.8 g/dL (ref 11.1–15.9)
IMMATURE GRANS (ABS): 0 10*3/uL (ref 0.0–0.1)
Immature Granulocytes: 0 %
Lymphocytes Absolute: 2.1 10*3/uL (ref 0.7–3.1)
Lymphs: 30 %
MCH: 30.1 pg (ref 26.6–33.0)
MCHC: 33.4 g/dL (ref 31.5–35.7)
MCV: 90 fL (ref 79–97)
MONOS ABS: 0.6 10*3/uL (ref 0.1–0.9)
Monocytes: 9 %
NEUTROS ABS: 4 10*3/uL (ref 1.4–7.0)
Neutrophils: 59 %
PLATELETS: 241 10*3/uL (ref 150–379)
RBC: 4.58 x10E6/uL (ref 3.77–5.28)
RDW: 13 % (ref 12.3–15.4)
WBC: 6.9 10*3/uL (ref 3.4–10.8)

## 2015-07-18 LAB — COMPREHENSIVE METABOLIC PANEL
ALBUMIN: 4.4 g/dL (ref 3.5–5.5)
ALK PHOS: 59 IU/L (ref 39–117)
ALT: 11 IU/L (ref 0–32)
AST: 17 IU/L (ref 0–40)
Albumin/Globulin Ratio: 1.8 (ref 1.2–2.2)
BUN / CREAT RATIO: 19 (ref 9–23)
BUN: 15 mg/dL (ref 6–24)
Bilirubin Total: 0.5 mg/dL (ref 0.0–1.2)
CO2: 26 mmol/L (ref 18–29)
CREATININE: 0.8 mg/dL (ref 0.57–1.00)
Calcium: 8.9 mg/dL (ref 8.7–10.2)
Chloride: 98 mmol/L (ref 96–106)
GFR, EST AFRICAN AMERICAN: 98 mL/min/{1.73_m2} (ref >59)
GFR, EST NON AFRICAN AMERICAN: 85 mL/min/{1.73_m2} (ref >59)
GLOBULIN, TOTAL: 2.4 g/dL (ref 1.5–4.5)
Glucose: 74 mg/dL (ref 65–99)
Potassium: 4.7 mmol/L (ref 3.5–5.2)
SODIUM: 140 mmol/L (ref 134–144)
TOTAL PROTEIN: 6.8 g/dL (ref 6.0–8.5)

## 2015-07-18 LAB — AMYLASE: AMYLASE: 77 U/L (ref 31–124)

## 2015-07-18 LAB — LIPASE: Lipase: 36 U/L (ref 0–59)

## 2015-07-20 ENCOUNTER — Telehealth: Payer: Self-pay

## 2015-07-20 NOTE — Telephone Encounter (Signed)
-----   Message from Mar Daring, [place] sent at 07/20/2015  8:26 AM EDT ----- All labs are within normal limits and stable.  Thanks! -JB

## 2015-07-20 NOTE — Telephone Encounter (Signed)
Not accepting calls at this time.  Thanks,  -[name]

## 2015-07-21 DIAGNOSIS — C73 Malignant neoplasm of thyroid gland: Secondary | ICD-10-CM | POA: Diagnosis not present

## 2015-07-21 DIAGNOSIS — E890 Postprocedural hypothyroidism: Secondary | ICD-10-CM | POA: Diagnosis not present

## 2015-07-22 NOTE — Telephone Encounter (Signed)
Patient advised as directed below. Per patient still having the abdominal discomfort and stated she is going to take Miralax today to see how she does if that doesn't help she will call back and if possible get an xray. Per patient she continue to take pregnancy teat and they are still negative. Janyce Llanos.  Thanks,  -Eiliana Drone

## 2015-07-22 NOTE — Telephone Encounter (Signed)
LMTCB  Thanks,  -[name] 

## 2015-07-22 NOTE — Telephone Encounter (Signed)
Pt returned your call. ° °ThanksTeri °

## 2015-07-23 ENCOUNTER — Telehealth: Payer: Self-pay | Admitting: Physician Assistant

## 2015-07-23 DIAGNOSIS — R1084 Generalized abdominal pain: Secondary | ICD-10-CM

## 2015-07-23 NOTE — Telephone Encounter (Signed)
Pt states she is still having stomach pain and swelling.  Pt is requesting to have a referral to have a ultrasound.  [id number]

## 2015-07-27 ENCOUNTER — Ambulatory Visit
Admission: RE | Admit: 2015-07-27 | Discharge: 2015-07-27 | Disposition: A | Discharge status: Home / Self Care | Payer: Medicare Other | Source: Ambulatory Visit | Attending: Physician Assistant | Admitting: Physician Assistant

## 2015-07-27 DIAGNOSIS — R1084 Generalized abdominal pain: Secondary | ICD-10-CM | POA: Diagnosis not present

## 2015-07-27 DIAGNOSIS — K824 Cholesterolosis of gallbladder: Secondary | ICD-10-CM | POA: Insufficient documentation

## 2015-07-28 ENCOUNTER — Telehealth: Payer: Self-pay

## 2015-07-28 NOTE — Telephone Encounter (Signed)
-----   Message from Mar Daring, [place] sent at 07/27/2015 11:10 AM EDT ----- Abdominal US was completely normal with exception of multiple small, largest was 2mm, polyps noted in the gallbladder. They are most likely benign for two reasons: 1) they are very small (need to be over 52mm to even have checked out in more detail) and 2) there are multiples (more worrisome if there was only single polyp). There is no guideline on follow up on gallbladder polyps. We can repeat US in one year and see if they change in size. If no change in size we can spread out imaging even further.

## 2015-07-28 NOTE — Telephone Encounter (Signed)
Patient advised as directed below yesterday. Patient wants a copy of her [place] results so that she can contact the Radiologist to asked more detail questions like if they noted any Fetus, embryo etc.  Thanks,  -Ciclaly Mulcahey

## 2015-07-29 ENCOUNTER — Ambulatory Visit: Payer: Medicare Other

## 2015-07-29 ENCOUNTER — Encounter: Payer: Self-pay | Admitting: Physician Assistant

## 2015-07-29 ENCOUNTER — Ambulatory Visit (INDEPENDENT_AMBULATORY_CARE_PROVIDER_SITE_OTHER): Payer: Medicare Other | Admitting: Physician Assistant

## 2015-07-29 VITALS — BP 108/70 | HR 62 | Temp 98.1°F | Resp 16 | Wt 97.0 lb

## 2015-07-29 DIAGNOSIS — R1084 Generalized abdominal pain: Secondary | ICD-10-CM

## 2015-07-29 DIAGNOSIS — F200 Paranoid schizophrenia: Secondary | ICD-10-CM

## 2015-07-29 DIAGNOSIS — R1030 Lower abdominal pain, unspecified: Secondary | ICD-10-CM
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
## 2015-07-31 ENCOUNTER — Telehealth: Payer: Self-pay | Admitting: Physician Assistant

## 2015-07-31 ENCOUNTER — Ambulatory Visit
Admission: RE | Admit: 2015-07-31 | Discharge: 2015-07-31 | Disposition: A | Discharge status: Home / Self Care | Payer: Medicare Other | Source: Ambulatory Visit | Attending: Physician Assistant | Admitting: Physician Assistant

## 2015-07-31 ENCOUNTER — Ambulatory Visit (HOSPITAL_COMMUNITY): Payer: Medicare Other

## 2015-07-31 DIAGNOSIS — R1030 Lower abdominal pain, unspecified: Secondary | ICD-10-CM | POA: Diagnosis not present

## 2015-07-31 DIAGNOSIS — N888 Other specified noninflammatory disorders of cervix uteri: Secondary | ICD-10-CM

## 2015-07-31 DIAGNOSIS — R109 Unspecified abdominal pain: Secondary | ICD-10-CM | POA: Diagnosis not present

## 2015-07-31 NOTE — Telephone Encounter (Signed)
Called patient and discussed [place] results. 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 I will place referral.

## 2015-07-31 NOTE — Telephone Encounter (Signed)
Pt stated that she went to have her ultra sound done today and they did find something in her uterus and she believes it to be a "baby bat" b/c she has bats in her apartment. Pt stated that she needs to have it evacuated from her uterus as soon as possible. Pt wanted to know if she needs to have that done here or at the [facility]. Please advise. Thanks TNP

## 2015-08-04 ENCOUNTER — Ambulatory Visit: Payer: Medicare Other

## 2015-08-19 DIAGNOSIS — R102 Pelvic and perineal pain: Secondary | ICD-10-CM | POA: Diagnosis not present

## 2015-08-19 DIAGNOSIS — R109 Unspecified abdominal pain: Secondary | ICD-10-CM | POA: Diagnosis not present

## 2015-08-27 DIAGNOSIS — R102 Pelvic and perineal pain: Secondary | ICD-10-CM | POA: Diagnosis not present

## 2015-09-02 ENCOUNTER — Encounter: Payer: Self-pay | Admitting: Obstetrics and Gynecology

## 2015-09-02 ENCOUNTER — Ambulatory Visit (INDEPENDENT_AMBULATORY_CARE_PROVIDER_SITE_OTHER): Payer: Medicare Other | Admitting: Obstetrics and Gynecology

## 2015-09-02 VITALS — BP 128/79 | HR 82 | Ht 61.0 in | Wt 95.6 lb

## 2015-09-02 DIAGNOSIS — F200 Paranoid schizophrenia: Secondary | ICD-10-CM

## 2015-09-02 DIAGNOSIS — F458 Other somatoform disorders: Secondary | ICD-10-CM | POA: Diagnosis not present

## 2015-09-02 DIAGNOSIS — N951 Menopausal and female climacteric states: Secondary | ICD-10-CM | POA: Diagnosis not present

## 2015-09-02 MED ORDER — CONJ ESTROG-MEDROXYPROGEST ACE 0.625-2.5 MG PO TABS: 1.0000 | ORAL_TABLET | Freq: Every day | ORAL | Status: DC

## 2015-09-02 NOTE — Patient Instructions (Addendum)
Menopause Menopause is the normal time of life when menstrual periods stop completely. Menopause is complete when you have missed 12 consecutive menstrual periods. It usually occurs between the ages of 48 years and 55 years. Very rarely does a woman develop menopause before the age of 40 years. At menopause, your ovaries stop producing the female hormones estrogen and progesterone. This can cause undesirable symptoms and also affect your health. Sometimes the symptoms may occur 4-5 years before the menopause begins. There is no relationship between menopause and:  Oral contraceptives.  Number of children you had.  Race.  The age your menstrual periods started (menarche). Heavy smokers and very thin women may develop menopause earlier in life. CAUSES  The ovaries stop producing the female hormones estrogen and progesterone.  Other causes include:  Surgery to remove both ovaries.  The ovaries stop functioning for no known reason.  Tumors of the pituitary gland in the brain.  Medical disease that affects the ovaries and hormone production.  Radiation treatment to the abdomen or pelvis.  Chemotherapy that affects the ovaries. SYMPTOMS   Hot flashes.  Night sweats.  Decrease in sex drive.  Vaginal dryness and thinning of the vagina causing painful intercourse.  Dryness of the skin and developing wrinkles.  Headaches.  Tiredness.  Irritability.  Memory problems.  Weight gain.  Bladder infections.  Hair growth of the face and chest.  Infertility. More serious symptoms include:  Loss of bone (osteoporosis) causing breaks (fractures).  Depression.  Hardening and narrowing of the arteries (atherosclerosis) causing heart attacks and strokes. DIAGNOSIS   When the menstrual periods have stopped for 12 straight months.  Physical exam.  Hormone studies of the blood. TREATMENT  There are many treatment choices and nearly as many questions about them. The  decisions to treat or not to treat menopausal changes is an individual choice made with your health care provider. Your health care provider can discuss the treatments with you. Together, you can decide which treatment will work best for you. Your treatment choices may include:   Hormone therapy (estrogen and progesterone).  Non-hormonal medicines.  Treating the individual symptoms with medicine (for example antidepressants for depression).  Herbal medicines that may help specific symptoms.  Counseling by a psychiatrist or psychologist.  Group therapy.  Lifestyle changes including:  Eating healthy.  Regular exercise.  Limiting caffeine and alcohol.  Stress management and meditation.  No treatment. HOME CARE INSTRUCTIONS   Take the medicine your health care provider gives you as directed.  Get plenty of sleep and rest.  Exercise regularly.  Eat a diet that contains calcium (good for the bones) and soy products (acts like estrogen hormone).  Avoid alcoholic beverages.  Do not smoke.  If you have hot flashes, dress in layers.  Take supplements, calcium, and vitamin D to strengthen bones.  You can use over-the-counter lubricants or moisturizers for vaginal dryness.  Group therapy is sometimes very helpful.  Acupuncture may be helpful in some cases. SEEK MEDICAL CARE IF:   You are not sure you are in menopause.  You are having menopausal symptoms and need advice and treatment.  You are still having menstrual periods after age 55 years.  You have pain with intercourse.  Menopause is complete (no menstrual period for 12 months) and you develop vaginal bleeding.  You need a referral to a specialist (gynecologist, psychiatrist, or psychologist) for treatment. SEEK IMMEDIATE MEDICAL CARE IF:   You have severe depression.  You have excessive vaginal bleeding.    You fell and think you have a broken bone.  You have pain when you urinate.  You develop leg or  chest pain.  You have a fast pounding heart beat (palpitations).  You have severe headaches.  You develop vision problems.  You feel a lump in your breast.  You have abdominal pain or severe indigestion.   This information is not intended to replace advice given to you by your health care provider. Make sure you discuss any questions you have with your health care provider.   Document Released: 06/04/2003 Document Revised: 11/14/2012 Document Reviewed: 10/11/2012 Elsevier Interactive Patient Education [employer]. .     Hormone Therapy At menopause, your body begins making less estrogen and progesterone hormones. This causes the body to stop having menstrual periods. This is because estrogen and progesterone hormones control your periods and menstrual cycle. A lack of estrogen may cause symptoms such as:  Hot flushes (or hot flashes).  Vaginal dryness.  Dry skin.  Loss of sex drive.  Risk of bone loss (osteoporosis). When this happens, you may choose to take hormone therapy to get back the estrogen lost during menopause. When the hormone estrogen is given alone, it is usually referred to as ET (Estrogen Therapy). When the hormone progestin is combined with estrogen, it is generally called HT (Hormone Therapy). This was formerly known as hormone replacement therapy (HRT). Your caregiver can help you make a decision on what will be best for you. The decision to use HT seems to change often as new studies are done. Many studies do not agree on the benefits of hormone replacement therapy. LIKELY BENEFITS OF HT INCLUDE PROTECTION FROM:  Hot Flushes (also called hot flashes) - A hot flush is a sudden feeling of heat that spreads over the face and body. The skin may redden like a blush. It is connected with sweats and sleep disturbance. Women going through menopause may have hot flushes a few times a month or several times per day depending on the woman.  Osteoporosis (bone  loss) - Estrogen helps guard against bone loss. After menopause, a woman's bones slowly lose calcium and become weak and brittle. As a result, bones are more likely to break. The hip, wrist, and spine are affected most often. Hormone therapy can help slow bone loss after menopause. Weight bearing exercise and taking calcium with vitamin D also can help prevent bone loss. There are also medications that your caregiver can prescribe that can help prevent osteoporosis.  Vaginal dryness - Loss of estrogen causes changes in the vagina. Its lining may become thin and dry. These changes can cause pain and bleeding during sexual intercourse. Dryness can also lead to infections. This can cause burning and itching. (Vaginal estrogen treatment can help relieve pain, itching, and dryness.)  Urinary tract infections are more common after menopause because of lack of estrogen. Some women also develop urinary incontinence because of low estrogen levels in the vagina and bladder.  Possible other benefits of estrogen include a positive effect on mood and short-term memory in women. RISKS AND COMPLICATIONS  Using estrogen alone without progesterone causes the lining of the uterus to grow. This increases the risk of lining of the uterus (endometrial) cancer. Your caregiver should give another hormone called progestin if you have a uterus.  Combined therapy also makes the breast tissue slightly denser which makes it harder to read mammograms (breast X-rays).  Combined, estrogen and progesterone therapy can be taken together every day, in which  case there may be spotting of blood. HT therapy can be taken cyclically in which case you will have menstrual periods. Cyclically means HT is taken for a set amount of days, then not taken, then this process is repeated.  HT may increase the risk of stroke, heart attack, breast cancer and forming blood clots in your leg.  Transdermal estrogen (estrogen that is absorbed through  the skin with a patch or a cream) may have better results with:  Cholesterol.  Blood pressure.  Blood clots. Having the following conditions may indicate you should not have HT:  Endometrial cancer.  Liver disease.  Breast cancer.  Heart disease.  History of blood clots.  Stroke. TREATMENT   If you choose to take HT and have a uterus, usually estrogen and progestin are prescribed.  Your caregiver will help you decide the best way to take the medications.  Possible ways to take estrogen include:  Pills.  Patches.  Gels.  Sprays.  Vaginal estrogen cream, rings and tablets.  It is best to take the lowest dose possible that will help your symptoms and take them for the shortest period of time that you can.  Hormone therapy can help relieve some of the problems (symptoms) that affect women at menopause. Before making a decision about HT, talk to your caregiver about what is best for you. Be well informed and comfortable with your decisions. HOME CARE INSTRUCTIONS   Follow your caregivers advice when taking the medications.  A Pap test is done to screen for cervical cancer.  The first Pap test should be done at age 8.  Between ages 92 and 64, Pap tests are repeated every 2 years.  Beginning at age 58, you are advised to have a Pap test every 3 years as long as the past 3 Pap tests have been normal.  Some women have medical problems that increase the chance of getting cervical cancer. Talk to your caregiver about these problems. It is especially important to talk to your caregiver if a new problem develops soon after your last Pap test. In these cases, your caregiver may recommend more frequent screening and Pap tests.  The above recommendations are the same for women who have or have not gotten the vaccine for HPV (human papillomavirus).  If you had a hysterectomy for a problem that was not a cancer or a condition that could lead to cancer, then you no longer need  Pap tests. However, even if you no longer need a Pap test, a regular exam is a good idea to make sure no other problems are starting.  If you are between ages 34 and 53, and you have had normal Pap tests going back 10 years, you no longer need Pap tests. However, even if you no longer need a Pap test, a regular exam is a good idea to make sure no other problems are starting.  If you have had past treatment for cervical cancer or a condition that could lead to cancer, you need Pap tests and screening for cancer for at least 20 years after your treatment.  If Pap tests have been discontinued, risk factors (such as a new sexual partner)need to be re-assessed to determine if screening should be resumed.  Some women may need screenings more often if they are at high risk for cervical cancer.  Get mammograms done as per the advice of your caregiver. SEEK IMMEDIATE MEDICAL CARE IF:  You develop abnormal vaginal bleeding.  You have pain or  swelling in your legs, shortness of breath, or chest pain.  You develop dizziness or headaches.  You have lumps or changes in your breasts or armpits.  You have slurred speech.  You develop weakness or numbness of your arms or legs.  You have pain, burning, or bleeding when urinating.  You develop abdominal pain.   This information is not intended to replace advice given to you by your health care provider. Make sure you discuss any questions you have with your health care provider.   Document Released: 12/11/2002 Document Revised: 07/29/2014 Document Reviewed: 09/15/2014 Elsevier Interactive Patient Education [employer].
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
## 2015-09-11 ENCOUNTER — Telehealth: Payer: Self-pay | Admitting: Obstetrics and Gynecology

## 2015-09-11 NOTE — Telephone Encounter (Signed)
Patient called stating she has uterine cysts and is uncomfortable.Please Advise

## 2015-09-11 NOTE — Telephone Encounter (Signed)
Please advise 

## 2015-09-15 ENCOUNTER — Telehealth: Payer: Self-pay | Admitting: Obstetrics and Gynecology

## 2015-09-15 NOTE — Telephone Encounter (Signed)
Ok. That's fine.

## 2015-09-15 NOTE — Telephone Encounter (Signed)
Called pt she states that she read about Prempro online and does not think that is the best medication for her. She is not going to take it at this time. Pt states that she has an appt with her other GYN in [place] Thursday and she will f/u with them about the issue she is having but will also keep her appt with [place] on the 9th og July.

## 2015-09-15 NOTE — Telephone Encounter (Signed)
Please assess if patient has begun medication (Prempro) as this will help her symptoms.

## 2015-09-15 NOTE — Telephone Encounter (Signed)
Ok

## 2015-09-15 NOTE — Telephone Encounter (Signed)
THIS PT LEFT MSG AND SAID SHE STILL WAS IN PAN AND WANTED A LENGTHY APPT ON Thursday. ??  I HAVEN'T CALLED HER YET

## 2015-09-15 NOTE — Telephone Encounter (Signed)
I spoke with this pt just a few minutes ago and informed her that we do not have any availability Thursday, I also advised her to keep her appt for July 9th. The pt told me that she would just see her GYN in [place] on Thursday for this pain.

## 2015-09-17 DIAGNOSIS — R109 Unspecified abdominal pain: Secondary | ICD-10-CM | POA: Diagnosis not present

## 2015-09-17 DIAGNOSIS — R102 Pelvic and perineal pain: Secondary | ICD-10-CM | POA: Diagnosis not present

## 2015-09-18 ENCOUNTER — Telehealth: Payer: Self-pay | Admitting: Obstetrics and Gynecology

## 2015-09-18 NOTE — Telephone Encounter (Signed)
PT SAID SHE WANTS TO KNOW IF THERE IS A DOUCHE OR SOMETHING YOU CAN ADD TO A DOUCHE TO GET RID OF HER CYSTS. SORRY I GUESS I AM REQUIRED TO SEND A NOTE

## 2015-09-18 NOTE — Telephone Encounter (Signed)
Pt aware OG and AC off today. Will get a return call on Monday.

## 2015-09-21 ENCOUNTER — Telehealth: Payer: Self-pay | Admitting: *Deleted

## 2015-09-21 ENCOUNTER — Ambulatory Visit: Payer: Medicare Other | Admitting: Psychiatry

## 2015-09-21 NOTE — Telephone Encounter (Signed)
Please advise 

## 2015-09-21 NOTE — Telephone Encounter (Signed)
Please inform patient that there is nothing that can be added to a douche.  If she desires to douche, she does not require anything extra.

## 2015-09-21 NOTE — Telephone Encounter (Signed)
Called pt, no answer. LM for pt informing her that per Dr.Cherry it is ok to douche. Advised pt to call back with any questions or concerns.

## 2015-09-21 NOTE — Telephone Encounter (Signed)
Patient called and states she is returning the nurse message. She is wondering what she can add to the douche to make the cyst come out. Patient is requesting a call back. Call back number is [phone]. Thanks

## 2015-09-22 NOTE — Telephone Encounter (Signed)
Called pt, no answer, LM for pt informing her of the information below.

## 2015-09-23 ENCOUNTER — Ambulatory Visit: Payer: Medicare Other | Admitting: Psychiatry

## 2015-09-24 ENCOUNTER — Telehealth: Payer: Self-pay | Admitting: Obstetrics and Gynecology

## 2015-09-24 NOTE — Telephone Encounter (Signed)
UHMMMMM.... This pt                                                                                                                                                                                                                                                                                                                                                            UHMMMM... PT CALLED AND SAID SHE IS STILL IN PAIN AND THE CYSTS ARE MULTIPLYING. shE WANTS TO KNOW IF SHE CAN HAVE A D&C IN OFFICE OR HOSPITAL. SHE SAID THE CYSTS AR NOT COMING OUT???????? SORRY!!!!

## 2015-09-25 NOTE — Telephone Encounter (Signed)
Called pt informed her that there is no medication that we can give her to resolve ovarian or "uterine" cysts as she has no such cysts per most recent exam and ultrasound. Advised pt that we do not perform D&Cs in office and at this time there is no indication for such a procedure. Advised pt that she needs to follow up with her GYN in [facility] (Dr.Amber Edisto) as she has been seeing them for years and they can provide the best continuity of care. Pt gave verbal understanding.

## 2015-10-02 ENCOUNTER — Ambulatory Visit (INDEPENDENT_AMBULATORY_CARE_PROVIDER_SITE_OTHER): Payer: Medicare Other | Admitting: Psychiatry

## 2015-10-02 ENCOUNTER — Encounter: Payer: Self-pay | Admitting: Psychiatry

## 2015-10-02 VITALS — BP 108/72 | HR 76 | Temp 97.3°F | Ht 61.0 in | Wt 99.2 lb

## 2015-10-02 DIAGNOSIS — F200 Paranoid schizophrenia: Secondary | ICD-10-CM

## 2015-10-02 NOTE — Progress Notes (Signed)
Patient ID: Diamond Lucas, female   DOB: [birth year], 55 y.o.   MRN: [id number]   Psychiatric Progress Note  Patient Identification: Diamond Lucas MRN:  [id number] Date of Evaluation:  10/02/2015 Chief Complaint:  Doing okay Chief Complaint    Follow-up; Medication Refill     Visit Diagnosis:    ICD-9-CM ICD-10-CM   1. Paranoid schizophrenia, chronic condition ([place]) 295.32 F20.0    Diagnosis:   Patient Active Problem List   Diagnosis Date Noted  . Personal history of malignant neoplasm of thyroid [Z85.850] 02/09/2015  . Rosacea [L71.9] 02/09/2015  . Rhinitis, allergic [J30.9] 12/26/2014  . BP (high blood pressure) [I10] 11/14/2014  . 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 Paranoid schizophrenia ([place]) [F20.0] 05/23/2014  . H/O thyroidectomy [E89.0] 05/23/2014  . 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 Reflux [K21.9] 09/26/2012   History of Present Illness:   Patient is a 55 year old Caucasian woman with a previous diagnosis of paranoid schizophrenia  comes in today for a follow-up visit. Reports her mood is good. She is waiting to have a hearing about being overpaid for her disability compensation. States there will be  An oral hearing and she will have to go up to [place]. Reports fair sleep and appetite. Denies auditory or visual hallucinations. Denies any suicidal thoughts. States there are bats in her attic that come and go. States she is looking for a job.   Past Medical History:  Past Medical History  Diagnosis Date  . Osteoarthritis   . Allergy   . History of syncope   . History of chicken pox   . History of measles   . Rosacea   . Arrhythmia   . Thyroid disease   . Schizophrenia [facility])     Past Surgical History  Procedure Laterality Date  . Thyroidectomy    . Foot surgery Right    Family History:  Family History  Problem Relation Age of Onset  . Depression Mother   .  Cataracts Mother   . Cancer Other   . Heart failure Other   . Heart disease Other   . Stroke Other   . Diabetes Other   . Kidney cancer Father   . Anxiety disorder Brother    Social History:   Social History   Social History  . Marital Status: Single    Spouse Name: N/A  . Number of Children: N/A  . Years of Education: N/A   Social History Main Topics  . Smoking status: Never Smoker   . Smokeless tobacco: Never Used  . Alcohol Use: No  . Drug Use: No  . Sexual Activity: Not Currently    Birth Control/ Protection: Post-menopausal   Other Topics Concern  . None   Social History Narrative   Additional Social History:   Musculoskeletal: Strength & Muscle Tone: within normal limits Gait & Station: normal Patient leans: N/A  Psychiatric Specialty Exam: HPI  ROS  Blood pressure 108/72, pulse 76, temperature 97.3 F (36.3 C), temperature source Tympanic, height 5\' 1"  (1.549 m), weight 99 lb 3.2 oz (44.997 kg), last menstrual period 03/29/2015, SpO2 99 %.Body mass index is 18.75 kg/(m^2).  General Appearance: Casual  Eye Contact:  Fair  Speech:  Clear and Coherent  Volume:  Normal  Mood:  brighter  Affect:  Congruent  Thought Process:  Circumstantial  Orientation:  Full (Time, Place, and Person)  Thought Content:  Delusions, Ilusions and Paranoid Ideation  Suicidal Thoughts:  No  Homicidal Thoughts:  No  Memory:  Immediate;   Fair Recent;   Fair Remote;   Fair  Judgement:  okay  Insight:  Lacking  Psychomotor Activity:  Normal  Concentration:  Fair  Recall:  [employer] of Knowledge:Fair  Language: Fair  Akathisia:  No  Handed:  Right  AIMS (if indicated):  na  Assets:  Communication Skills Desire for Improvement Housing  ADL's:  Intact  Cognition: WNL  Sleep:  fair   Is the patient at risk to self?  No. Has the patient been a risk to self in the past 6 months?  No. Has the patient been a risk to self within the distant past?  No. Is the patient a  risk to others?  No. Has the patient been a risk to others in the past 6 months?  No. Has the patient been a risk to others within the distant past?  No.  Allergies:   Allergies  Allergen Reactions  . Diclofenac Sodium Palpitations  . Naproxen Itching, Rash and Hives  . Dust Mite Mixed Allergen Ext [Mite (D. Farinae)] Itching    Respiratory illness, and itchy eyes, eye problems  . Nsaids     Other reaction(s): Other (See Comments)  . Other Itching    Respiratory illness, and itchy eyes, eye problems   Current Medications: Current Outpatient Prescriptions  Medication Sig Dispense Refill  . Azelaic Acid 15 % cream Apply topically. Reported on 06/18/2015    . bacitracin-polymyxin b (POLYSPORIN) ophthalmic ointment Reported on 07/29/2015  0  . Calcium Carbonate-Vit D-Min (CALCIUM 600+D PLUS MINERALS) 600-400 MG-UNIT TABS Take by mouth. Reported on 03/20/2015    . Cholecalciferol (VITAMIN D3) 1000 UNITS CAPS Take by mouth.    . cyclobenzaprine (FLEXERIL) 5 MG tablet Take by mouth.    . cycloSPORINE (RESTASIS) 0.05 % ophthalmic emulsion 1 drop.    Marland Kitchen erythromycin ophthalmic ointment Reported on 07/29/2015  0  . estrogen, conjugated,-medroxyprogesterone (PREMPRO) 0.625-2.5 MG tablet Take 1 tablet by mouth daily. 30 tablet 6  . levothyroxine (SYNTHROID, LEVOTHROID) 75 MCG tablet TAKE 1 TABLET BY MOUTH DAILY    . metroNIDAZOLE (METROCREAM) 0.75 % cream Apply topically 2 (two) times daily. 45 g 0  . misoprostol (CYTOTEC) 200 MCG tablet     . Multiple Vitamins-Minerals (MULTIVITAMIN ADULT PO) Take by mouth. Reported on 07/29/2015    . mometasone (NASONEX) 50 MCG/ACT nasal spray Place into the nose.     No current facility-administered medications for this visit.    Previous Psychotropic Medications: Yes   Substance Abuse History in the last 12 months:  No.  Consequences of Substance Abuse: Negative  Medical Decision Making:  Established Problem, Stable/Improving (1), Review of Psycho-Social  Stressors (1), Review and summation of old records (2), Review of Medication Regimen & Side Effects (2) and Review of New Medication or Change in Dosage (2)  Treatment Plan Summary: Medication management   Paranoid schizophrenia Patient appears to be stable with decreased paranoia. She is functioning well and able to manage with her ADL`s.  Depression Patient is not interested in obtaining any treatment.  RTC clinic in 3 month's time or call before if necessary    Dayzha Pogosyan 7/7/20178:37 AM

## 2015-10-06 ENCOUNTER — Encounter: Payer: Self-pay | Admitting: Physician Assistant

## 2015-10-06 ENCOUNTER — Emergency Department
Admission: EM | Admit: 2015-10-06 | Discharge: 2015-10-06 | Disposition: A | Discharge status: Other Acute Inpatient Hospital | Payer: Medicare Other | Attending: Emergency Medicine | Admitting: Emergency Medicine

## 2015-10-06 ENCOUNTER — Ambulatory Visit (INDEPENDENT_AMBULATORY_CARE_PROVIDER_SITE_OTHER): Payer: Medicare Other | Admitting: Physician Assistant

## 2015-10-06 ENCOUNTER — Telehealth: Payer: Self-pay | Admitting: Obstetrics and Gynecology

## 2015-10-06 ENCOUNTER — Inpatient Hospital Stay
Admission: EM | Admit: 2015-10-06 | Discharge: 2015-10-09 | DRG: 885 | Disposition: A | Discharge status: Home / Self Care | Payer: Medicare Other | Source: Intra-hospital | Attending: Psychiatry | Admitting: Psychiatry

## 2015-10-06 VITALS — BP 118/60 | HR 80 | Temp 97.6°F | Resp 16 | Wt 99.2 lb

## 2015-10-06 DIAGNOSIS — F200 Paranoid schizophrenia: Secondary | ICD-10-CM

## 2015-10-06 DIAGNOSIS — F22 Delusional disorders: Secondary | ICD-10-CM | POA: Diagnosis not present

## 2015-10-06 DIAGNOSIS — Z9119 Patient's noncompliance with other medical treatment and regimen: Secondary | ICD-10-CM

## 2015-10-06 DIAGNOSIS — E890 Postprocedural hypothyroidism: Secondary | ICD-10-CM | POA: Diagnosis present

## 2015-10-06 DIAGNOSIS — Z85850 Personal history of malignant neoplasm of thyroid: Secondary | ICD-10-CM | POA: Diagnosis not present

## 2015-10-06 DIAGNOSIS — Z888 Allergy status to other drugs, medicaments and biological substances status: Secondary | ICD-10-CM | POA: Diagnosis not present

## 2015-10-06 DIAGNOSIS — Z833 Family history of diabetes mellitus: Secondary | ICD-10-CM | POA: Diagnosis not present

## 2015-10-06 DIAGNOSIS — R1030 Lower abdominal pain, unspecified: Secondary | ICD-10-CM | POA: Diagnosis present

## 2015-10-06 DIAGNOSIS — S0990XA Unspecified injury of head, initial encounter: Secondary | ICD-10-CM | POA: Diagnosis not present

## 2015-10-06 DIAGNOSIS — Z9889 Other specified postprocedural states: Secondary | ICD-10-CM | POA: Diagnosis not present

## 2015-10-06 DIAGNOSIS — Z8051 Family history of malignant neoplasm of kidney: Secondary | ICD-10-CM

## 2015-10-06 DIAGNOSIS — Z91199 Patient's noncompliance with other medical treatment and regimen due to unspecified reason: Secondary | ICD-10-CM

## 2015-10-06 DIAGNOSIS — Z79899 Other long term (current) drug therapy: Secondary | ICD-10-CM | POA: Insufficient documentation

## 2015-10-06 DIAGNOSIS — K219 Gastro-esophageal reflux disease without esophagitis: Secondary | ICD-10-CM | POA: Diagnosis present

## 2015-10-06 DIAGNOSIS — F209 Schizophrenia, unspecified: Secondary | ICD-10-CM | POA: Insufficient documentation

## 2015-10-06 DIAGNOSIS — R102 Pelvic and perineal pain: Secondary | ICD-10-CM

## 2015-10-06 DIAGNOSIS — M1990 Unspecified osteoarthritis, unspecified site: Secondary | ICD-10-CM | POA: Insufficient documentation

## 2015-10-06 DIAGNOSIS — I10 Essential (primary) hypertension: Secondary | ICD-10-CM | POA: Diagnosis present

## 2015-10-06 DIAGNOSIS — Z8249 Family history of ischemic heart disease and other diseases of the circulatory system: Secondary | ICD-10-CM | POA: Diagnosis not present

## 2015-10-06 DIAGNOSIS — R1032 Left lower quadrant pain: Secondary | ICD-10-CM | POA: Diagnosis not present

## 2015-10-06 DIAGNOSIS — R42 Dizziness and giddiness: Secondary | ICD-10-CM

## 2015-10-06 DIAGNOSIS — Z823 Family history of stroke: Secondary | ICD-10-CM | POA: Diagnosis not present

## 2015-10-06 DIAGNOSIS — R1084 Generalized abdominal pain: Secondary | ICD-10-CM

## 2015-10-06 DIAGNOSIS — R52 Pain, unspecified: Secondary | ICD-10-CM

## 2015-10-06 DIAGNOSIS — H8110 Benign paroxysmal vertigo, unspecified ear: Secondary | ICD-10-CM

## 2015-10-06 DIAGNOSIS — Z818 Family history of other mental and behavioral disorders: Secondary | ICD-10-CM | POA: Diagnosis not present

## 2015-10-06 DIAGNOSIS — E039 Hypothyroidism, unspecified: Secondary | ICD-10-CM | POA: Diagnosis not present

## 2015-10-06 LAB — CBC
HEMATOCRIT: 39.8 % (ref 35.0–47.0)
HEMOGLOBIN: 13.9 g/dL (ref 12.0–16.0)
MCH: 31.5 pg (ref 26.0–34.0)
MCHC: 35 g/dL (ref 32.0–36.0)
MCV: 90.1 fL (ref 80.0–100.0)
Platelets: 208 10*3/uL (ref 150–440)
RBC: 4.42 MIL/uL (ref 3.80–5.20)
RDW: 12.7 % (ref 11.5–14.5)
WBC: 6.1 10*3/uL (ref 3.6–11.0)

## 2015-10-06 LAB — COMPREHENSIVE METABOLIC PANEL
ALBUMIN: 3.9 g/dL (ref 3.5–5.0)
ALK PHOS: 59 U/L (ref 38–126)
ALT: 20 U/L (ref 14–54)
ANION GAP: 7 (ref 5–15)
AST: 19 U/L (ref 15–41)
BUN: 17 mg/dL (ref 6–20)
CALCIUM: 9 mg/dL (ref 8.9–10.3)
CHLORIDE: 101 mmol/L (ref 101–111)
CO2: 30 mmol/L (ref 22–32)
Creatinine, Ser: 0.66 mg/dL (ref 0.44–1.00)
GFR calc Af Amer: >60 mL/min (ref >60)
GFR calc non Af Amer: >60 mL/min (ref >60)
GLUCOSE: 91 mg/dL (ref 65–99)
POTASSIUM: 3.8 mmol/L (ref 3.5–5.1)
Sodium: 138 mmol/L (ref 135–145)
Total Bilirubin: 0.4 mg/dL (ref 0.3–1.2)
Total Protein: 6.5 g/dL (ref 6.5–8.1)

## 2015-10-06 LAB — URINALYSIS COMPLETE WITH MICROSCOPIC (ARMC ONLY)
BACTERIA UA: NONE SEEN
BILIRUBIN URINE: NEGATIVE
Glucose, UA: NEGATIVE mg/dL
HGB URINE DIPSTICK: NEGATIVE
KETONES UR: NEGATIVE mg/dL
LEUKOCYTES UA: NEGATIVE
NITRITE: NEGATIVE
PH: 8 (ref 5.0–8.0)
PROTEIN: NEGATIVE mg/dL
SPECIFIC GRAVITY, URINE: 1.005 (ref 1.005–1.030)

## 2015-10-06 LAB — LIPASE, BLOOD: Lipase: 25 U/L (ref 11–51)

## 2015-10-06 MED ORDER — ACETAMINOPHEN 325 MG PO TABS
650.0000 mg | ORAL_TABLET | Freq: Four times a day (QID) | ORAL | Status: DC | PRN
  Administered 2015-10-08 (×2): 650 mg via ORAL
  Filled 2015-10-06 (×3): qty 2

## 2015-10-06 MED ORDER — OLANZAPINE 5 MG PO TBDP
10.0000 mg | ORAL_TABLET | Freq: Every day | ORAL | Status: DC
Start: 2015-10-06 — End: 2015-10-07
  Filled 2015-10-06: qty 2

## 2015-10-06 MED ORDER — MAGNESIUM HYDROXIDE 400 MG/5ML PO SUSP: 30.0000 mL | Freq: Every day | ORAL | Status: DC | PRN

## 2015-10-06 MED ORDER — LEVOTHYROXINE SODIUM 25 MCG PO TABS
75.0000 ug | ORAL_TABLET | Freq: Every day | ORAL | Status: DC
  Administered 2015-10-07 – 2015-10-09 (×3): 75 ug via ORAL
  Filled 2015-10-06 (×3): qty 3

## 2015-10-06 MED ORDER — ALUM & MAG HYDROXIDE-SIMETH 200-200-20 MG/5ML PO SUSP: 30.0000 mL | ORAL | Status: DC | PRN

## 2015-10-06 MED ORDER — ACETAMINOPHEN 325 MG PO TABS
ORAL_TABLET | ORAL | Status: AC
  Administered 2015-10-06: 650 mg via ORAL
  Filled 2015-10-06: qty 2

## 2015-10-06 MED ORDER — OLANZAPINE 10 MG PO TBDP: 10.0000 mg | ORAL_TABLET | Freq: Every day | ORAL | Status: DC

## 2015-10-06 MED ORDER — LEVOTHYROXINE SODIUM 50 MCG PO TABS: 75.0000 ug | ORAL_TABLET | Freq: Every day | ORAL | Status: DC

## 2015-10-06 MED ORDER — ACETAMINOPHEN 325 MG PO TABS
650.0000 mg | ORAL_TABLET | Freq: Once | ORAL | Status: AC
  Administered 2015-10-06: 650 mg via ORAL

## 2015-10-06 NOTE — Discharge Instructions (Signed)

## 2015-10-06 NOTE — ED Notes (Addendum)
Pt believes that she is pregnant with a bat and she was sent here for ultrasound.   Pt denies SI or HI. Pt pleasant and cooperative. Pt alert and oriented X4, active, cooperative, pt in NAD. RR even and unlabored, color WNL.

## 2015-10-06 NOTE — Progress Notes (Signed)
Patient: Diamond Lucas Female    DOB: 14-Nov-1960   55 y.o.   MRN: [id number] Visit Date: 10/06/2015  Today's Provider: Mar Daring, PA-C   Chief Complaint  Patient presents with  . Abdominal Pain   Subjective:    HPI    Abdominal Pain: Patient complains of abdominal pain. The pain is described as aching, cramping, shooting, stabbing and "fluttering", and is 10/10 in intensity. Pain is located in the LLQ with radiation to back. Onset was 4 months ago. Symptoms have been gradually worsening since onset. Associated symptoms: anorexia, constipation, diarrhea, nausea and vomiting. Pt is requesting another ABD [place], or to be admitted to hospital. Does have appointment with Dr. Marcelline Mates on 10/14/2015; pt reports she cannot wait until that appointment, and that is the soonest she can see Dr. Marcelline Mates due to her being on vacation. This is causing pt to lose sleep.  She states that there are bats in her apartment and that she was told that the female bats will drop semen and can impregnate you. She feels that they have dropped semen on her couch and bed causing her to become pregnant. She has had similar delusions of recent with most recent being men placing sperm in her apartment.  She has had an abdominal US and transvaginal US, both that were fairly normal with exception of a nabothian cyst. She has seen Dr. Marcelline Mates.  She denies any urinary changes. She denies any vaginal bleeding. She does report constipation, but states this is normal. She will occasionally use metamucil to help with BM. Had one small BM this morning. Reports a normal BM yesterday.  She is also followed by Dr. Einar Grad.    Allergies  Allergen Reactions  . Diclofenac Sodium Palpitations  . Naproxen Itching, Rash and Hives  . Dust Mite Mixed Allergen Ext [Mite (D. Farinae)] Itching    Respiratory illness, and itchy eyes, eye problems  . Nsaids     Other reaction(s): Other (See Comments)  . Other Itching   Respiratory illness, and itchy eyes, eye problems   Current Meds  Medication Sig  . Azelaic Acid 15 % cream Apply topically. Reported on 06/18/2015  . Calcium Carbonate-Vit D-Min (CALCIUM 600+D PLUS MINERALS) 600-400 MG-UNIT TABS Take by mouth. Reported on 03/20/2015  . Cholecalciferol (VITAMIN D3) 1000 UNITS CAPS Take by mouth.  . cycloSPORINE (RESTASIS) 0.05 % ophthalmic emulsion 1 drop.  Marland Kitchen levothyroxine (SYNTHROID, LEVOTHROID) 75 MCG tablet TAKE 1 TABLET BY MOUTH DAILY  . metroNIDAZOLE (METROCREAM) 0.75 % cream Apply topically 2 (two) times daily.  . Multiple Vitamins-Minerals (MULTIVITAMIN ADULT PO) Take by mouth. Reported on 07/29/2015    Review of Systems  Constitutional: Positive for fever, chills, diaphoresis, activity change, appetite change and fatigue. Negative for unexpected weight change.  HENT: Negative.   Respiratory: Negative.   Cardiovascular: Negative.   Gastrointestinal: Positive for nausea, vomiting, abdominal pain, diarrhea, constipation and abdominal distention.  Genitourinary: Negative.   Musculoskeletal: Negative.   Psychiatric/Behavioral: Positive for hallucinations and sleep disturbance. Negative for suicidal ideas, self-injury and dysphoric mood.    Social History  Substance Use Topics  . Smoking status: Never Smoker   . Smokeless tobacco: Never Used  . Alcohol Use: No   Objective:   BP 118/60 mmHg  Pulse 80  Temp(Src) 97.6 F (36.4 C) (Oral)  Resp 16  Wt 99 lb 3.2 oz (44.997 kg)  LMP 03/29/2015  Physical Exam  Constitutional: She is oriented to person, place, and  time. She appears well-developed and well-nourished. No distress.  Cardiovascular: Normal rate, regular rhythm and normal heart sounds.  Exam reveals no gallop and no friction rub.   No murmur heard. Pulmonary/Chest: Effort normal and breath sounds normal. No respiratory distress. She has no wheezes. She has no rales.  Abdominal: Soft. Normal appearance and bowel sounds are normal. She  exhibits no distension and no mass. There is no hepatosplenomegaly. There is no tenderness. There is no rebound, no guarding and no CVA tenderness.  Neurological: She is alert and oriented to person, place, and time.  Skin: Skin is warm and dry. She is not diaphoretic.      Assessment & Plan:     1. Generalized abdominal pain Unknown cause of abdominal pain and to know if it is a true symptom or if part of the delusion of pregnancy. She denies wanting to harm herself. She states she would never try to "cut it out" herself. She however does still wish to go to the hospital for further evaluation of the abdominal pain to see if they will repeat the [place] to see if there is a "bat" inside of her and remove it. We discussed how this is impossible, but she stated she did not find that encouraging and still wanted to be seen. I informed her that I could not tell her not to go and if she felt that was best then that was her decision. She still refuses to admit to her delusions and refuses medications, however, she is not a threat to herself or others at this time and reports no ideas of self harm.  2. Paranoid schizophrenia ([place]) See above medical treatment plan.       Mar Daring, PA-C  [facility]

## 2015-10-06 NOTE — ED Notes (Addendum)
Dr.Clapacs at bedside  

## 2015-10-06 NOTE — ED Notes (Addendum)
Pt requesting headache medication.  Pt offered food and drink, declines.

## 2015-10-06 NOTE — ED Notes (Signed)
Pt requesting to contact an attorney.

## 2015-10-06 NOTE — Patient Instructions (Signed)

## 2015-10-06 NOTE — ED Notes (Signed)
Pt presents with left lower quad pain with n/v/d for four mths but has been worse the past couple of days. Pt thinks that there might be a "bat" or "roach" in her belly flapping its wings.

## 2015-10-06 NOTE — ED Notes (Signed)
Pt was dressed out by ED staff. Pt was cooperative and removed all personal belongings. Pt./belongings were placed in a labeled bag. Her "overnight bag" that was also brought was placed in her labeled personal belongings bag

## 2015-10-06 NOTE — ED Notes (Signed)
Contacted patient, pt answers and asks who is calling. When informed it is the ER, patient hangs up.

## 2015-10-06 NOTE — ED Notes (Signed)
Pt back to ED by [employer]; pt placed in [address]. Pt requesting medical treatment, pt informed she has been medically evaluated and is being admitted to the psychiatric unit under IVC.

## 2015-10-06 NOTE — ED Notes (Signed)
Pt in agreeance for admission.

## 2015-10-06 NOTE — Tx Team (Signed)
Initial Interdisciplinary Treatment Plan   PATIENT STRESSORS: Financial difficulties Medication change or noncompliance   PATIENT STRENGTHS: Capable of independent living General fund of knowledge Religious Affiliation   PROBLEM LIST: Problem List/Patient Goals Date to be addressed Date deferred Reason deferred Estimated date of resolution  "goal is to alleviate the pain" 10/06/15     Delusional thinking  10/06/15     Non compliance  10/06/15                                          DISCHARGE CRITERIA:  Improved stabilization in mood, thinking, and/or behavior  PRELIMINARY DISCHARGE PLAN: Outpatient therapy  PATIENT/FAMIILY INVOLVEMENT: This treatment plan has been presented to and reviewed with the patient, Diamond Lucas, and/or family member.  The patient and family have been given the opportunity to ask questions and make suggestions.  Derrill Kay 10/06/2015, 8:53 PM

## 2015-10-06 NOTE — ED Provider Notes (Addendum)
[facility] Emergency Department Provider Note   ____________________________________________    I have reviewed the triage vital signs and the nursing notes.   HISTORY  Chief Complaint Abdominal Pain     HPI Diamond Lucas is a 55 y.o. female who presents with a complaint of lower abdominal pain which she has had for over a month. She is convinced that she has a bat in her uterus. She notes there are Bat[place] her crawlspace and she feels that she has been impregnated by a bat. she was seen by her GYN last month who assured her that she was not pregnant with a bat, she even performed an ultrasound. Today she saw her primary care provider who also reassured her that the patient opted to come to the emergency department for another opinion. She has no nausea or vomiting. No vaginal discharge. No dysuria. She feels well currently.   Past Medical History  Diagnosis Date  . Osteoarthritis   . Allergy   . History of syncope   . History of chicken pox   . History of measles   . Rosacea   . Arrhythmia   . Thyroid disease   . Schizophrenia [facility])     Patient Active Problem List   Diagnosis Date Noted  . Personal history of malignant neoplasm of thyroid 02/09/2015  . Rosacea 02/09/2015  . Rhinitis, allergic 12/26/2014  . BP (high blood pressure) 11/14/2014  . Hypothyroidism, postop 05/23/2014  . Paranoid schizophrenia ([place]) 05/23/2014  . H/O thyroidectomy 05/23/2014  . 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 Reflux 09/26/2012    Past Surgical History  Procedure Laterality Date  . Thyroidectomy    . Foot surgery Right     Current Outpatient Rx  Name  Route  Sig  Dispense  Refill  . Calcium Carbonate-Vit D-Min (CALCIUM 600+D PLUS MINERALS) 600-400 MG-UNIT TABS   Oral   Take 1 tablet by mouth daily. Reported on 03/20/2015         . Cholecalciferol (VITAMIN D3) 1000 UNITS CAPS   Oral  Take 1 capsule by mouth daily.          . cyclobenzaprine (FLEXERIL) 5 MG tablet   Oral   Take 5 mg by mouth at bedtime. Reported on 10/06/2015         . cycloSPORINE (RESTASIS) 0.05 % ophthalmic emulsion   Both Eyes   Place 1 drop into both eyes 2 (two) times daily.          Marland Kitchen levothyroxine (SYNTHROID, LEVOTHROID) 75 MCG tablet      TAKE 1 TABLET BY MOUTH DAILY         . Multiple Vitamins-Minerals (MULTIVITAMIN ADULT PO)   Oral   Take 1 tablet by mouth daily. Reported on 07/29/2015         . Azelaic Acid 15 % cream   Topical   Apply topically. Reported on 06/18/2015         . erythromycin ophthalmic ointment      Reported on 10/06/2015      0   . misoprostol (CYTOTEC) 200 MCG tablet   Oral   Take 200 mcg by mouth 4 (four) times daily. Reported on 10/06/2015         . EXPIRED: mometasone (NASONEX) 50 MCG/ACT nasal spray   Nasal   Place 2 sprays into the nose daily.            Allergies Diclofenac sodium; Naproxen;  Dust mite mixed allergen ext; Nsaids; and Other  Family History  Problem Relation Age of Onset  . Depression Mother   . Cataracts Mother   . Cancer Other   . Heart failure Other   . Heart disease Other   . Stroke Other   . Diabetes Other   . Kidney cancer Father   . Anxiety disorder Brother     Social History Social History  Substance Use Topics  . Smoking status: Never Smoker   . Smokeless tobacco: Never Used  . Alcohol Use: No    Review of Systems  Constitutional: No fever/chills Eyes: No visual changes. No discharge ENT: No sore throat. Cardiovascular: Denies chest pain. Respiratory: Denies shortness of breath. Gastrointestinal: As above  No nausea, no vomiting.   Genitourinary: Negative for dysuria. Musculoskeletal: Negative for back pain. Skin: Negative for rash. Neurological: Negative for headaches  10-point ROS otherwise negative.  ____________________________________________   PHYSICAL EXAM:  VITAL  SIGNS: ED Triage Vitals  Enc Vitals Group     BP 10/06/15 1054 131/80 mmHg     Pulse Rate 10/06/15 1054 85     Resp 10/06/15 1054 18     Temp 10/06/15 1054 97.9 F (36.6 C)     Temp Source 10/06/15 1054 Oral     SpO2 10/06/15 1054 99 %     Weight --      Height --      Head Cir --      Peak Flow --      Pain Score 10/06/15 1054 10     Pain Loc --      Pain Edu? --      Excl. in [place]? --     Constitutional: Alert and oriented. No acute distress.  Eyes: Conjunctivae are normal.  Head: Atraumatic.Normocephalic Nose: No congestion/rhinnorhea. Mouth/Throat: Mucous membranes are moist.  Oropharynx non-erythematous. Neck: No stridor. Painless ROM Cardiovascular: Normal rate, regular rhythm. Grossly normal heart sounds.  Good peripheral circulation. Respiratory: Normal respiratory effort.  No retractions. Lungs CTAB. Gastrointestinal: No abdominal tenderness to palpation. No distention.  No CVA tenderness. Genitourinary: deferred Musculoskeletal: No lower extremity tenderness nor edema.  Warm and well perfused Neurologic:  Normal speech and language. No gross focal neurologic deficits are appreciated.  Skin:  Skin is warm, dry and intact. No rash noted. Psychiatric: Mood and affect are normal. Speech and behavior are normal. However patient is quite convinced that she is pregnant with a bat  ____________________________________________   LABS (all labs ordered are listed, but only abnormal results are displayed)  Labs Reviewed  URINALYSIS COMPLETEWITH MICROSCOPIC ([place]) - Abnormal; Notable for the following:    Color, Urine YELLOW (*)    APPearance CLEAR (*)    Squamous Epithelial / LPF 0-5 (*)    All other components within normal limits  CBC  COMPREHENSIVE METABOLIC PANEL  LIPASE, BLOOD    ____________________________________________  EKG  None ____________________________________________  RADIOLOGY  None ____________________________________________   PROCEDURES  Procedure(s) performed: No    Critical Care performed: No ____________________________________________   INITIAL IMPRESSION / ASSESSMENT AND PLAN / ED COURSE  Pertinent labs & imaging results that were available during my care of the patient were reviewed by me and considered in my medical decision making (see chart for details).  Patient is well-appearing with no distress. Her abdominal exam is completely benign. I have tried to reassure her but now she is insisting on seeing a psychiatrist. I discussed with Dr. Weber Cooks, he will see the  patient in the emergency department.  I feel patient is medically cleared  ____________________________________________  ----------------------------------------- 4:09 PM on 10/06/2015 -----------------------------------------  Seen by Dr. Weber Cooks he would like to admit the patient  FINAL CLINICAL IMPRESSION(S) / ED DIAGNOSES  Final diagnoses:  Lower abdominal pain      NEW MEDICATIONS STARTED DURING THIS VISIT:  New Prescriptions   No medications on file     Note:  This document was prepared using Dragon voice recognition software and may include unintentional dictation errors.    Lavonia Drafts, MD 10/06/15 1519  Lavonia Drafts, MD 10/06/15 [id number]

## 2015-10-06 NOTE — Telephone Encounter (Signed)
PT CALLED AND SHE KNOWS THAT DR CHERRY IS NOT HERE, AND SHE WENT TO PCP FOR DUE TO HER THINKING SHE HAS A BAT INSIDE OF HER UTERUS, THEY TOLD HER TO GO TO URGENT CARE/ ER TO HAVE US DONE, SHE WENT AND THEY DENIED HER, SO SHE WANTS TO KNOW IF DR DE COULD SEE HER OR ORDER A [place] TO SEE HOW FAR ALONG SHE IS WITH BEING PREGNANT FROM A BAT.

## 2015-10-06 NOTE — Consult Note (Signed)
[place] [facility] Consult   Reason for Consult:  Consult for 55 year old woman who presented to the emergency room concerned that she has bats inside her uterus. Referring Physician:  Corky Downs Patient Identification: Diamond Lucas MRN:  [id number] Principal Diagnosis: Paranoid schizophrenia [facility]) Diagnosis:   Patient Active Problem List   Diagnosis Date Noted  . Noncompliance [Z91.19] 10/06/2015  . Involuntary commitment [Z04.6] 10/06/2015  . Personal history of malignant neoplasm of thyroid [Z85.850] 02/09/2015  . Rosacea [L71.9] 02/09/2015  . Rhinitis, allergic [J30.9] 12/26/2014  . BP (high blood pressure) [I10] 11/14/2014  . 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 Paranoid schizophrenia ([place]) [F20.0] 05/23/2014  . H/O thyroidectomy [E89.0] 05/23/2014  . 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 Reflux [K21.9] 09/26/2012    Total Time spent with patient: 1 hour  Subjective:   Diamond Lucas is a 55 y.o. female patient admitted with "I know this is going to sound crazy, but I have a bat inside of me".  HPI:  55 year old woman with a history of schizophrenia presented to the emergency room where she was referred by her primary care doctor. Patient states that she is convinced that she has a bat living inside her body. She says that she believes this has happened because there are bats living in the attic of her apartment building and that [place] has dripped upon her. She says that they do that just like "little man". Patient says that she can feel it in there and absolutely insists that someone give her an abortion. She says she has been having this feeling for several months. It appears that she has been going to multiple doctors with these complaints and has even had an ultrasound to prove that it is not true but she persists in coming to the hospital. Patient admits that she did not tell  about this symptom when she saw her recently. She is not taking any psychiatric medicine. Patient is hesitant to divulge much more history. Admits that she feels anxious. Denies any suicidal intent. Appears to be avoiding psychiatric treatment and probably misusing other medications because of her physical delusions.  Social history: Lives by herself in an apartment building. Sees Dr. Einar Grad for outpatient psychiatric treatment. Closest relative is her brother but she does not stay in touch with him.  Medical history: Patient is status post thyroidectomy for thyroid tumor and takes Synthroid replacement. She has had multiple complaints of other probably delusional medical complaints such as believing she had parasites living in her eyes. It looks like she's been prescribed medications for these although there is no clear proof of specific pathology.  Substance abuse history: Patient denies abuse of alcohol or drugs. No evidence of that in the old chart.  Past Psychiatric History: Carries a diagnosis of schizophrenia. Records we have available don't show any inpatient hospitalizations. Doesn't look like she's been on any medicine in quite some time because she refuses it and avoids talking about her symptoms. Unknown if she has any history of suicide attempts she won't cooperate with further history.  Risk to Self: Is patient at risk for suicide?: No Risk to Others:   Prior Inpatient Therapy:   Prior Outpatient Therapy:    Past Medical History:  Past Medical History  Diagnosis Date  . Osteoarthritis   . Allergy   . History of syncope   . History of chicken pox   . History of measles   . Rosacea   .  Arrhythmia   . Thyroid disease   . Schizophrenia [facility])     Past Surgical History  Procedure Laterality Date  . Thyroidectomy    . Foot surgery Right    Family History:  Family History  Problem Relation Age of Onset  . Depression Mother   . Cataracts Mother   . Cancer Other   . Heart  failure Other   . Heart disease Other   . Stroke Other   . Diabetes Other   . Kidney cancer Father   . Anxiety disorder Brother    Family Psychiatric  History: Patient declines to answer questions about this Social History:  History  Alcohol Use No     History  Drug Use No    Social History   Social History  . Marital Status: Single    Spouse Name: N/A  . Number of Children: N/A  . Years of Education: N/A   Social History Main Topics  . Smoking status: Never Smoker   . Smokeless tobacco: Never Used  . Alcohol Use: No  . Drug Use: No  . Sexual Activity: Not Currently    Birth Control/ Protection: Post-menopausal   Other Topics Concern  . Not on file   Social History Narrative   Additional Social History:    Allergies:   Allergies  Allergen Reactions  . Diclofenac Sodium Palpitations  . Naproxen Itching, Rash and Hives  . Dust Mite Mixed Allergen Ext [Mite (D. Farinae)] Itching    Respiratory illness, and itchy eyes, eye problems  . Nsaids     Other reaction(s): Other (See Comments)  . Other Itching    Respiratory illness, and itchy eyes, eye problems    Labs:  Results for orders placed or performed during the hospital encounter of 10/06/15 (from the past 48 hour(s))  Urinalysis complete, with microscopic ([facility] only)     Status: Abnormal   Collection Time: 10/06/15 11:16 AM  Result Value Ref Range   Color, Urine YELLOW (A) YELLOW   APPearance CLEAR (A) CLEAR   Glucose, UA NEGATIVE NEGATIVE mg/dL   Bilirubin Urine NEGATIVE NEGATIVE   Ketones, ur NEGATIVE NEGATIVE mg/dL   Specific Gravity, Urine 1.005 1.005 - 1.030   Hgb urine dipstick NEGATIVE NEGATIVE   pH 8.0 5.0 - 8.0   Protein, ur NEGATIVE NEGATIVE mg/dL   Nitrite NEGATIVE NEGATIVE   Leukocytes, UA NEGATIVE NEGATIVE   RBC / HPF 0-5 0 - 5 RBC/hpf   WBC, UA 0-5 0 - 5 WBC/hpf   Bacteria, UA NONE SEEN NONE SEEN   Squamous Epithelial / LPF 0-5 (A) NONE SEEN   Mucous PRESENT    Hyaline Casts, UA  PRESENT   CBC     Status: None   Collection Time: 10/06/15 11:20 AM  Result Value Ref Range   WBC 6.1 3.6 - 11.0 K/uL   RBC 4.42 3.80 - 5.20 MIL/uL   Hemoglobin 13.9 12.0 - 16.0 g/dL   HCT 39.8 35.0 - 47.0 %   MCV 90.1 80.0 - 100.0 fL   MCH 31.5 26.0 - 34.0 pg   MCHC 35.0 32.0 - 36.0 g/dL   RDW 12.7 11.5 - 14.5 %   Platelets 208 150 - 440 K/uL  Comprehensive metabolic panel     Status: None   Collection Time: 10/06/15 11:20 AM  Result Value Ref Range   Sodium 138 135 - 145 mmol/L   Potassium 3.8 3.5 - 5.1 mmol/L   Chloride 101 101 - 111 mmol/L  CO2 30 22 - 32 mmol/L   Glucose, Bld 91 65 - 99 mg/dL   BUN 17 6 - 20 mg/dL   Creatinine, Ser 0.66 0.44 - 1.00 mg/dL   Calcium 9.0 8.9 - 10.3 mg/dL   Total Protein 6.5 6.5 - 8.1 g/dL   Albumin 3.9 3.5 - 5.0 g/dL   AST 19 15 - 41 U/L   ALT 20 14 - 54 U/L   Alkaline Phosphatase 59 38 - 126 U/L   Total Bilirubin 0.4 0.3 - 1.2 mg/dL   GFR calc non Af Amer >60 >60 mL/min   GFR calc Af Amer >60 >60 mL/min    Comment: (NOTE) The eGFR has been calculated using the CKD EPI equation. This calculation has not been validated in all clinical situations. eGFR's persistently <60 mL/min signify possible Chronic Kidney Disease.    Anion gap 7 5 - 15  Lipase, blood     Status: None   Collection Time: 10/06/15 11:20 AM  Result Value Ref Range   Lipase 25 11 - 51 U/L    Current Facility-Administered Medications  Medication Dose Route Frequency Provider Last Rate Last Dose  . [START ON 10/07/2015] levothyroxine (SYNTHROID, LEVOTHROID) tablet 75 mcg  75 mcg Oral QAC breakfast Gonzella Lex, MD      . OLANZapine zydis (ZYPREXA) disintegrating tablet 10 mg  10 mg Oral QHS Gonzella Lex, MD       Current Outpatient Prescriptions  Medication Sig Dispense Refill  . Calcium Carbonate-Vit D-Min (CALCIUM 600+D PLUS MINERALS) 600-400 MG-UNIT TABS Take 1 tablet by mouth daily. Reported on 03/20/2015    . Cholecalciferol (VITAMIN D3) 1000 UNITS CAPS Take  1 capsule by mouth daily.     . cyclobenzaprine (FLEXERIL) 5 MG tablet Take 5 mg by mouth at bedtime. Reported on 10/06/2015    . cycloSPORINE (RESTASIS) 0.05 % ophthalmic emulsion Place 1 drop into both eyes 2 (two) times daily.     Marland Kitchen levothyroxine (SYNTHROID, LEVOTHROID) 75 MCG tablet TAKE 1 TABLET BY MOUTH DAILY    . Multiple Vitamins-Minerals (MULTIVITAMIN ADULT PO) Take 1 tablet by mouth daily. Reported on 07/29/2015    . Azelaic Acid 15 % cream Apply topically. Reported on 06/18/2015    . erythromycin ophthalmic ointment Reported on 10/06/2015  0  . misoprostol (CYTOTEC) 200 MCG tablet Take 200 mcg by mouth 4 (four) times daily. Reported on 10/06/2015    . mometasone (NASONEX) 50 MCG/ACT nasal spray Place 2 sprays into the nose daily.       Musculoskeletal: Strength & Muscle Tone: within normal limits Gait & Station: normal Patient leans: N/A  Psychiatric Specialty Exam: Physical Exam  Nursing note and vitals reviewed. Constitutional: She appears well-developed and well-nourished.  HENT:  Head: Normocephalic and atraumatic.  Eyes: Conjunctivae are normal. Pupils are equal, round, and reactive to light.  Neck: Normal range of motion.  Cardiovascular: Normal heart sounds.   Respiratory: Effort normal and breath sounds normal. No respiratory distress.  GI: Soft.  Musculoskeletal: Normal range of motion.  Neurological: She is alert.  Skin: Skin is warm and dry.  Psychiatric: Her mood appears anxious. Her affect is blunt and inappropriate. Her speech is delayed. She is slowed. Thought content is paranoid and delusional. Cognition and memory are normal. She expresses inappropriate judgment.    Review of Systems  Constitutional: Negative.   HENT: Negative.   Eyes: Negative.   Respiratory: Negative.   Cardiovascular: Negative.   Gastrointestinal: Negative.   Genitourinary:  Patient believes that she has been impregnated by bats. She believes that she has bats inside of her  uterus. Claims that she can feel them in there.  Musculoskeletal: Negative.   Skin: Negative.   Neurological: Negative.   Psychiatric/Behavioral: Positive for hallucinations. Negative for depression, suicidal ideas, memory loss and substance abuse. The patient is nervous/anxious. The patient does not have insomnia.     Blood pressure 131/80, pulse 85, temperature 97.9 F (36.6 C), temperature source Oral, resp. rate 18, last menstrual period 03/29/2015, SpO2 99 %.There is no weight on file to calculate BMI.  General Appearance: Casual  Eye Contact:  Minimal  Speech:  Slow  Volume:  Decreased  Mood:  Anxious  Affect:  Constricted  Thought Process:  Disorganized  Orientation:  Full (Time, Place, and Person)  Thought Content:  Illogical and Delusions  Suicidal Thoughts:  No  Homicidal Thoughts:  No  Memory:  Immediate;   Fair Recent;   Fair Remote;   Fair  Judgement:  Impaired  Insight:  Lacking  Psychomotor Activity:  Decreased  Concentration:  Concentration: Fair  Recall:  [employer] of Knowledge:  Fair  Language:  Fair  Akathisia:  No  Handed:  Right  AIMS (if indicated):     Assets:  Communication Skills Housing  ADL's:  Intact  Cognition:  WNL  Sleep:        Treatment Plan Summary: Daily contact with patient to assess and evaluate symptoms and progress in treatment, Medication management and Plan 55 year old woman with a diagnosis of schizophrenia not currently on any medicine. Patient is extremely delusional. Will not consider any alternative explanation for her suppose it symptoms of discomfort. She has persisted in going from multiple episodes of medical treatment as well as taking abortifactant medicines for this delusion. Poor self-care. Patient is off so quite underweight and I suspect she may be doing a generally poor job taking care of herself. She will be admitted to the psychiatry ward. I have filed involuntary commitment papers based on her poor self-care and  psychosis. Initiate Zyprexa for psychotic symptoms. Full labs will be checked. 15 minute checks in place. Case reviewed with the ER doctor.  Disposition: Recommend psychiatric Inpatient admission when medically cleared. Supportive therapy provided about ongoing stressors.  Alethia Berthold, MD 10/06/2015 5:15 PM

## 2015-10-06 NOTE — ED Notes (Addendum)
Pt contacted via phone number provided and answers. She states that "a nurse came into my room and told me that I was discharged". Pt instructed to come back to ER immediatly that she was not to be discharged. Pt states that she will come back if she can get an ultrasound. Pt told to come back to ER at this time, she states "I will come back tonight". Pt told that she needs to come back now.

## 2015-10-06 NOTE — ED Notes (Addendum)
Patient had conversation with psychiatrist and psychiatrist not in room anymore.  This RN into patient room, pt not found in room at this time. All of belongings gone. Patient relations, Kyra Manges reports that patient asked if she needed to provide anymore insurance information before she left while in lobby. Pt stated that she was discharged per patient relations.  Charge RN informed.   This RN not informed that pt IVC papers were to be completed, no order in computer, neither EDP or Psychiatrist informed this RN. ODS not aware either.

## 2015-10-06 NOTE — Progress Notes (Signed)
Patient ID: Diamond Lucas, female   DOB: [birth year], 55 y.o.   MRN: [id number] Patient admitted IVC from ED after stating she thought she was pregnant with a bat. She denies SI/HI/AVH. When patient got on the unit she asked why there weren't any medical beds she could stay in. She insist she does not belong on this floor and that she was mistaken for a woman that was in the ED who was being loud. She refused the HS Zyprexa. She appears anxious. Skin search done with [facility]. No contraband found. She has remained calm and cooperative. Safety maintained with 15 min checks.

## 2015-10-06 NOTE — ED Notes (Signed)
Pt brought back to ER by [employer].

## 2015-10-07 ENCOUNTER — Inpatient Hospital Stay: Payer: Medicare Other

## 2015-10-07 DIAGNOSIS — I10 Essential (primary) hypertension: Secondary | ICD-10-CM

## 2015-10-07 DIAGNOSIS — F22 Delusional disorders: Secondary | ICD-10-CM

## 2015-10-07 LAB — LIPID PANEL
CHOL/HDL RATIO: 3.1 ratio
Cholesterol: 275 mg/dL — ABNORMAL HIGH (ref 0–200)
HDL: 90 mg/dL (ref >40)
LDL Cholesterol: 164 mg/dL — ABNORMAL HIGH (ref 0–99)
Triglycerides: 106 mg/dL (ref <150)
VLDL: 21 mg/dL (ref 0–40)

## 2015-10-07 LAB — HCG, QUANTITATIVE, PREGNANCY: hCG, Beta Chain, Quant, S: 3 m[IU]/mL (ref <5)

## 2015-10-07 LAB — TSH: TSH: 0.282 u[IU]/mL — AB (ref 0.350–4.500)

## 2015-10-07 MED ORDER — OLANZAPINE 10 MG PO TABS
10.0000 mg | ORAL_TABLET | Freq: Every day | ORAL | Status: DC
Start: 2015-10-07 — End: 2015-10-09

## 2015-10-07 MED ORDER — OLANZAPINE 10 MG PO TABS
10.0000 mg | ORAL_TABLET | Freq: Every day | ORAL | Status: DC
Start: 2015-10-07 — End: 2015-10-08
  Administered 2015-10-07: 10 mg via ORAL
  Filled 2015-10-07: qty 1

## 2015-10-07 NOTE — Tx Team (Signed)
Interdisciplinary Treatment Plan Update (Adult)         Date: 10/07/2015   Time Reviewed: 10:30 AM   Progress in Treatment: Improving Attending groups: Yes  Participating in groups: Yes  Taking medication as prescribed: Yes  Tolerating medication: Yes  Family/Significant other contact made: CSW assessing proper contacts Patient understands diagnosis: Yes  Discussing patient identified problems/goals with staff: Yes  Medical problems stabilized or resolved: Yes  Denies suicidal/homicidal ideation: Yes  Issues/concerns per patient self-inventory: Yes  Other:   New problem(s) identified: N/A   Discharge Plan or Barriers: see below   Reason for Continuation of Hospitalization:   Depression   Anxiety   Medication Stabilization   Comments: N/A   Estimated length of stay: 7 days    Patient is a 55 year old female admitted for paranoid symptoms. Patient lives in [place], [place]. 55 year old woman with a history of schizophrenia presented to the emergency room where she was referred by her primary care doctor. Patient states that she is convinced that she has a bat living inside her body. She says that she believes this has happened because there are bats living in the attic of her apartment building and that [place] has dripped upon her. She says that they do that just like "little man". Patient says that she can feel it in there and absolutely insists that someone give her an abortion. She says she has been having this feeling for several months. It appears that she has been going to multiple doctors with these complaints and has even had an ultrasound to prove that it is not true but she persists in coming to the hospital. Patient admits that she did not tell about this symptom when she saw her recently. She is not taking any psychiatric medicine. Patient is hesitant to divulge much more history. Admits that she feels anxious. Denies any suicidal intent. Appears to be avoiding  psychiatric treatment and probably misusing other medications because of her physical delusions. Patient will benefit from crisis stabilization, medication evaluation, group therapy, and psycho education in addition to case management for discharge planning. Patient and CSW reviewed pt's identified goals and treatment plan. Pt verbalized understanding and agreed to treatment plan.    Review of initial/current patient goals per problem list:  1. Goal(s): Patient will participate in aftercare plan   Met: CSW assessing proper follow-up plans  Target date: 3-5 days post admission date   As evidenced by: Patient will participate within aftercare plan AEB aftercare provider and housing plan at discharge being identified.   10/07/15: Assessing proper aftercare plans.  2. Goal (s): Patient will exhibit decreased depressive symptoms and suicidal ideations.   Met: Yes  Target date: 3-5 days post admission date   As evidenced by: Patient will utilize self-rating of depression at 3 or below and demonstrate decreased signs of depression or be deemed stable for discharge by MD.   10/07/15: Pt denies SI/HI.  Pt reports no depressive symptoms.   3. Goal(s): Patient will demonstrate decreased signs and symptoms of anxiety.   Met: Goal progressing  Target date: 3-5 days post admission date   As evidenced by: Patient will utilize self-rating of anxiety at 3 or below and demonstrated decreased signs of anxiety, or be deemed stable for discharge by MD   10/07/15: Patient reports an anxiety score of 5 at this time.   5. Goal(s): Patient will demonstrate decreased signs of psychosis  * Met: No * Target date: 3-5 days post admission  date  * As evidenced by: Patient will demonstrate decreased frequency of AVH or return to baseline function   10/07/15: Patient believes that bats and a baby is inside her uterus - having these delusions for a while.  Attendees:  Patient:  Family:  Physician: Dr.  Merlyn Albert , MD    10/07/2015 9:30 AM  Nursing: Polly Cobia , RN      10/07/2015 10:30 AM  Clinical Social Worker: Emilie Rutter, LCSWA  10/07/2015 10:30 AM  Other: Marylou Flesher, [place]    10/07/2015 10:30 AM  Other:  Everitt Amber, Recreational Therapist   10/07/2015 10:30 AM  Other:        10/07/2015 10:30 AM  Other:        10/07/2015 10:30 AM

## 2015-10-07 NOTE — BHH Suicide Risk Assessment (Signed)
[facility] INPATIENT:  Family/Significant Other Suicide Prevention Education  Suicide Prevention Education:  Patient Refusal for Family/Significant Other Suicide Prevention Education: The patient Diamond Lucas has refused to provide written consent for family/significant other to be provided Family/Significant Other Suicide Prevention Education during admission and/or prior to discharge. Patient stated that she does not want to bother any of her family members at this time. Physician notified.  Emilie Rutter, LCSW-A 10/07/2015, 4:09 PM

## 2015-10-07 NOTE — Telephone Encounter (Signed)
Pt with history of paranoid schizophrenia and pregnancy delusion. Pt currently admitted at of 10/07/15 in behavioral health.

## 2015-10-07 NOTE — BHH Group Notes (Signed)
[facility] LCSW Group Therapy   10/07/2015  1pm  Type of Therapy: Group Therapy   Participation Level: Did Not Attend. Patient invited to participate but declined.    Obdulia Steier F. Rally Ouch, MSW, LCSWA, LCAS     

## 2015-10-07 NOTE — Progress Notes (Signed)
Patient is pleasant and cooperative with care. Continues to believe she is pregnant with bats or some type of rodent. Pt had xray done today.  Encouragement and support offered. Redirected as needed. Pt receptive and providing self adl care. Remains safe on unit with q 15 min checks.

## 2015-10-07 NOTE — Plan of Care (Signed)
Problem: Self-Care: Goal: Ability to participate in self-care as condition permits will improve Outcome: Progressing Patient showering and providing self care.

## 2015-10-07 NOTE — Progress Notes (Signed)
Recreation Therapy Notes  Date: 07.12.17 Time: 9:30 am Location: Craft Room  Group Topic: Self-esteem  Goal Area(s) Addresses:  Patient will write at least one positive trait about self. Patient will verbalize benefit of having healthy self-esteem.  Behavioral Response: Did not attend  Intervention: I Am  Activity: Patients were given a worksheet with the letter I on it and instructed to write as many positive traits about themselves inside the letter.  Education: LRT educated patients on ways they can increase their self-esteem.  Education Outcome: Patient did not attend group.   Clinical Observations/Feedback: Patient did not attend group.  Leonette Monarch, LRT/CTRS 10/07/2015 10:10 AM
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
## 2015-10-07 NOTE — [facility]HH Group Notes (Signed)
[facility] Group Notes:  (Nursing/MHT/Case Management/Adjunct)  Date:  10/07/2015  Time:  6:34 PM  Type of Therapy:  Psychoeducational Skills  Participation Level:  Active  Participation Quality:  Appropriate, Attentive and Supportive  Affect:  Appropriate  Cognitive:  Alert and Appropriate  Insight:  Appropriate  Engagement in Group:  Engaged  Modes of Intervention:  Discussion, Education and Support  Summary of Progress/Problems:  Adela Lank [facility] 10/07/2015, 6:34 PM

## 2015-10-07 NOTE — Care Management (Signed)
Diamond Lucas seemed very receptive and open to more conversations with the Chaplain. We talked while she was eating breakfast, but she would like to continue our conversation. Chaplain will set up an appointment today around the daily schedule.

## 2015-10-08 ENCOUNTER — Inpatient Hospital Stay: Payer: Medicare Other

## 2015-10-08 LAB — GLUCOSE, CAPILLARY: Glucose-Capillary: 88 mg/dL (ref 65–99)

## 2015-10-08 LAB — PROLACTIN: PROLACTIN: 10.7 ng/mL (ref 4.8–23.3)

## 2015-10-08 LAB — HEMOGLOBIN A1C: HEMOGLOBIN A1C: 5 % (ref 4.0–6.0)

## 2015-10-08 IMAGING — CT CT HEAD W/O CM
3 series · 16 of 47 positions shown, 19 images · non-contrast
Comparison: CT HEAD [DATE]

CLINICAL DATA: Fall, injuring face due to vertigo. Evaluate head
trauma. History of schizophrenia and hypertension.

EXAM:
CT HEAD WITHOUT CONTRAST
TECHNIQUE: Contiguous axial images were obtained from the base of the skull
through the vertex without intravenous contrast.

[Series 2: head wo · axial · 0.38mm/px · z∈[+464,+589]mm · 10 of 31 slices shown, 13 images]
[im 3/31  brain]
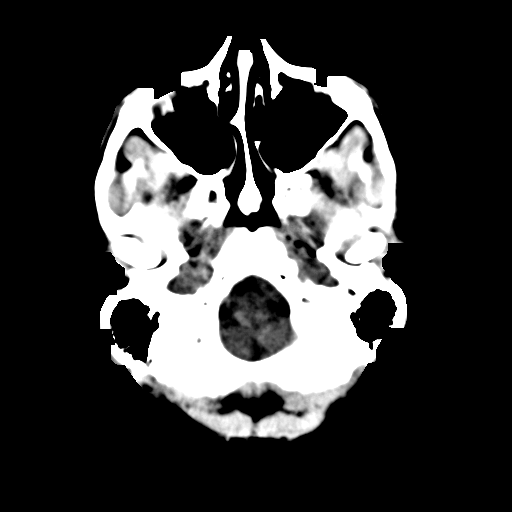
[im 3/31  bone]
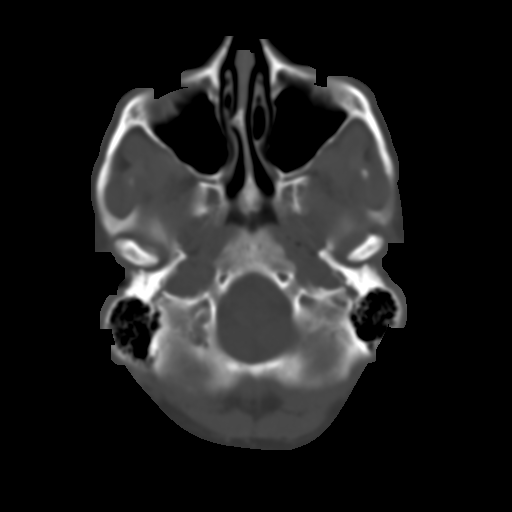
[im 6/31  brain]
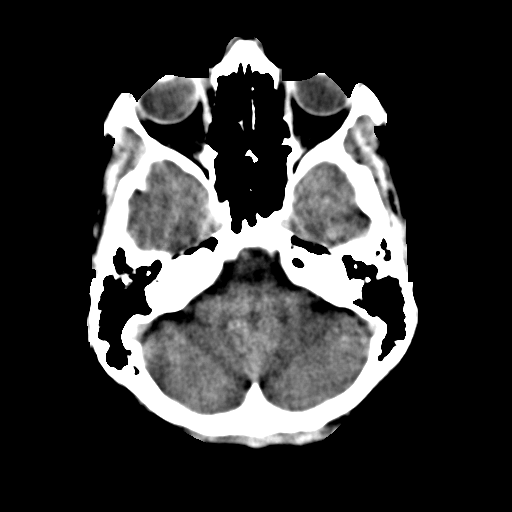
[im 9/31  brain]
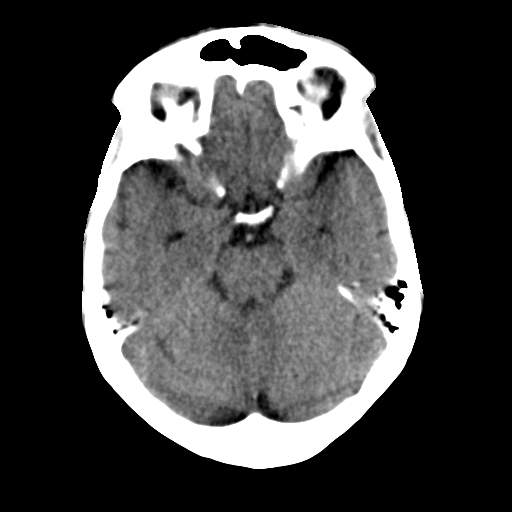
[im 11/31  brain]
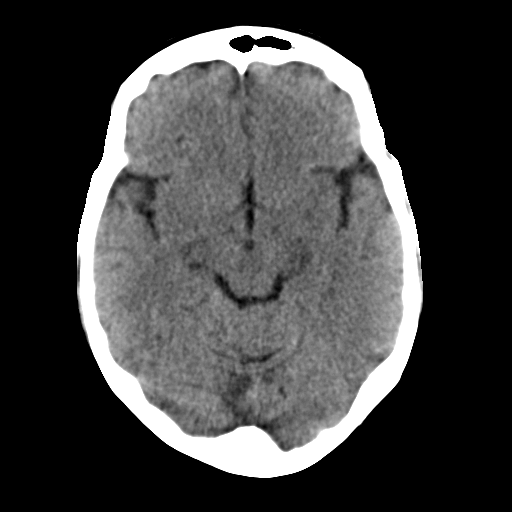
[im 14/31  brain]
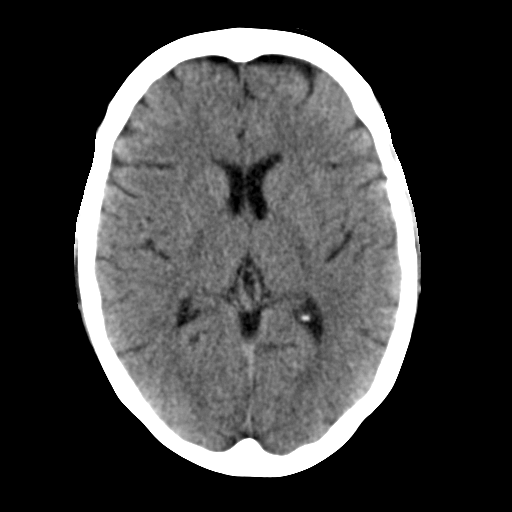
[im 14/31  bone]
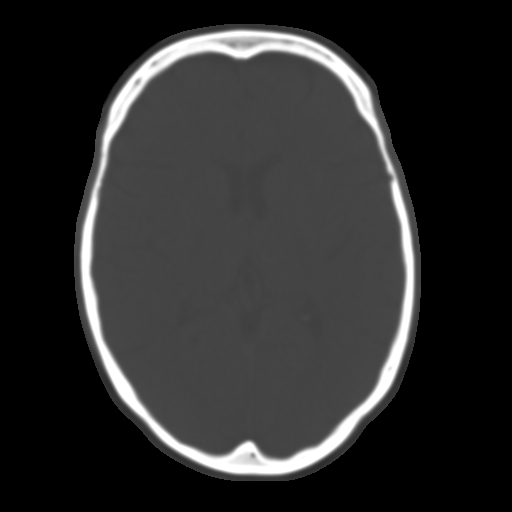
[im 17/31  brain]
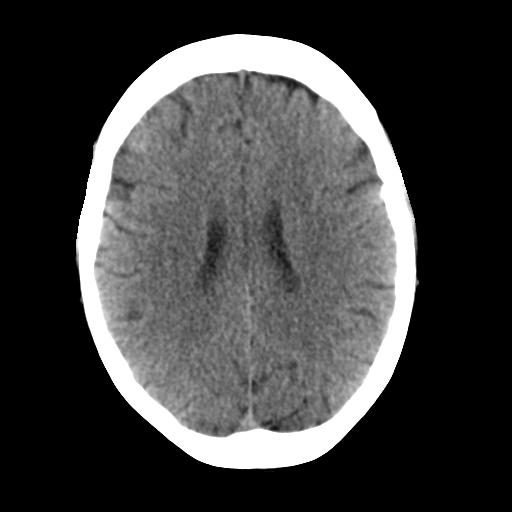
[im 20/31  brain]
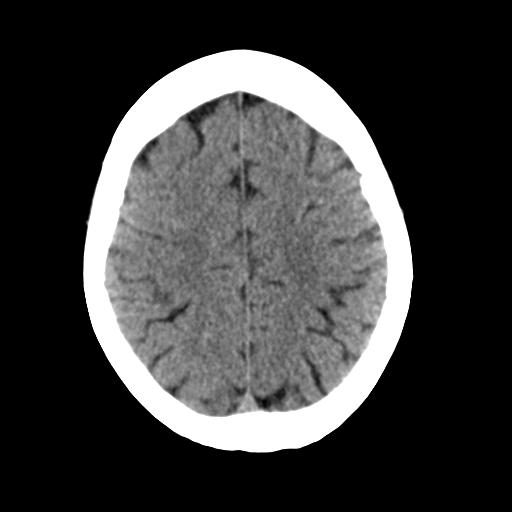
[im 23/31  brain]
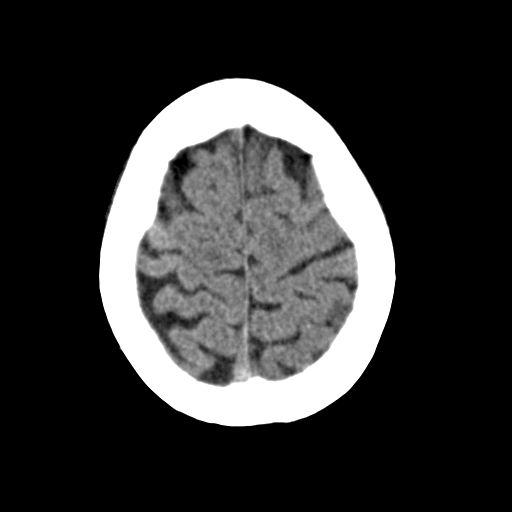
[im 25/31  brain]
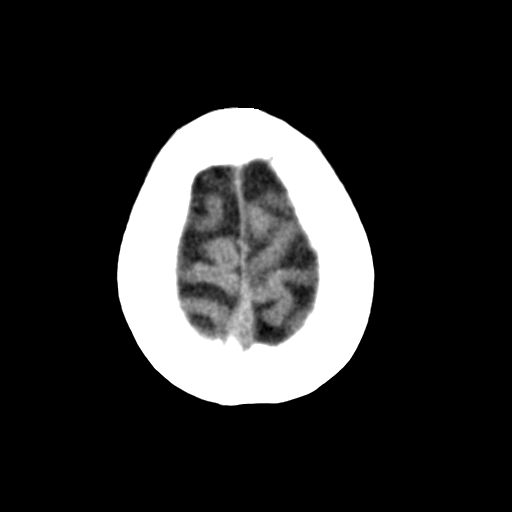
[im 25/31  bone]
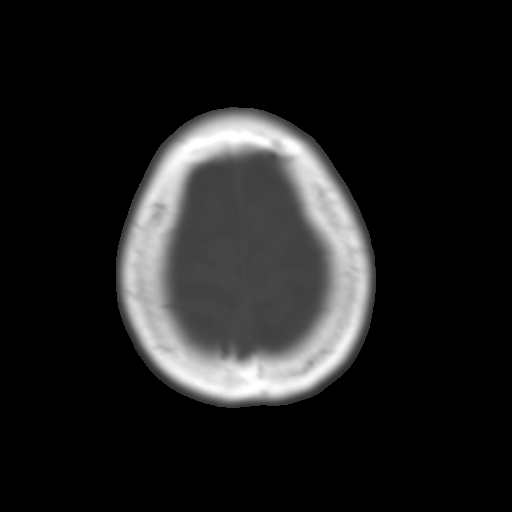
[im 28/31  brain]
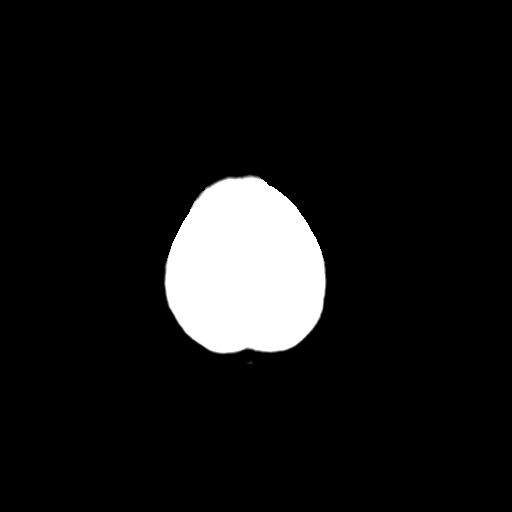

[Series 4: coronal soft tissue · coronal · 0.28mm/px · 3 of 58 slices shown]
[im 20/58  brain]
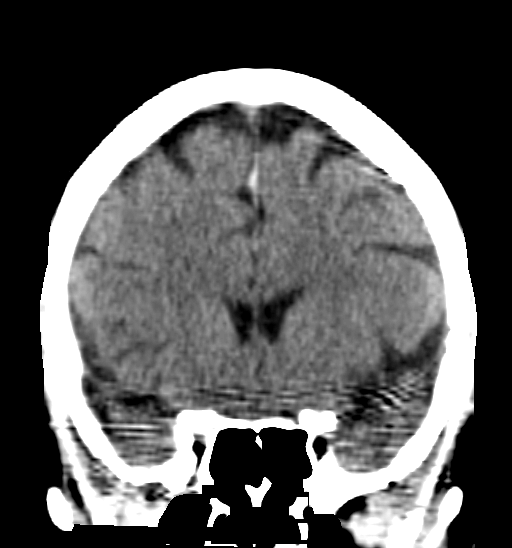
[im 26/58  brain]
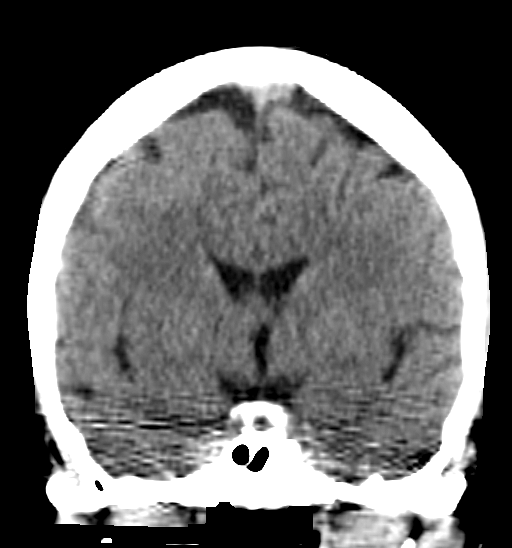
[im 32/58  brain]
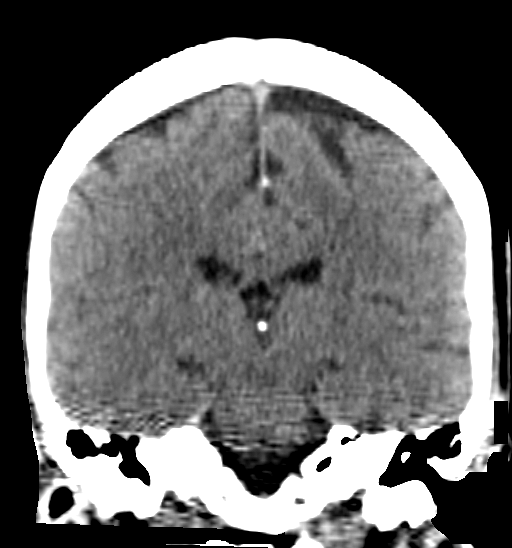

[Series 5: sagittal soft tissue · sagittal · 0.28mm/px · 3 of 44 slices shown]
[im 15/44  brain]
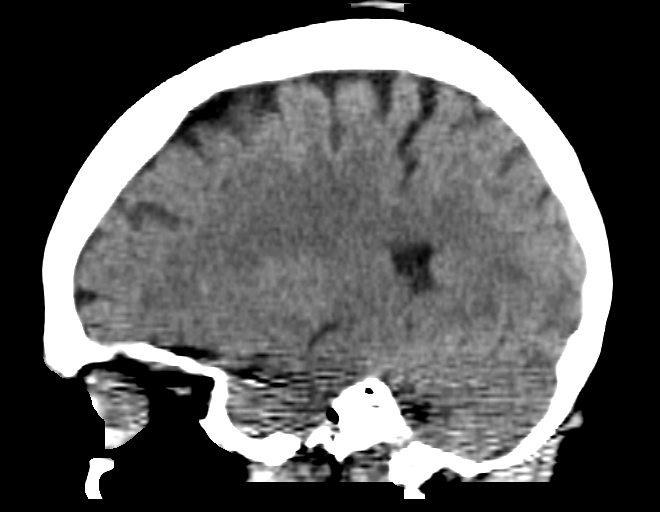
[im 22/44  brain]
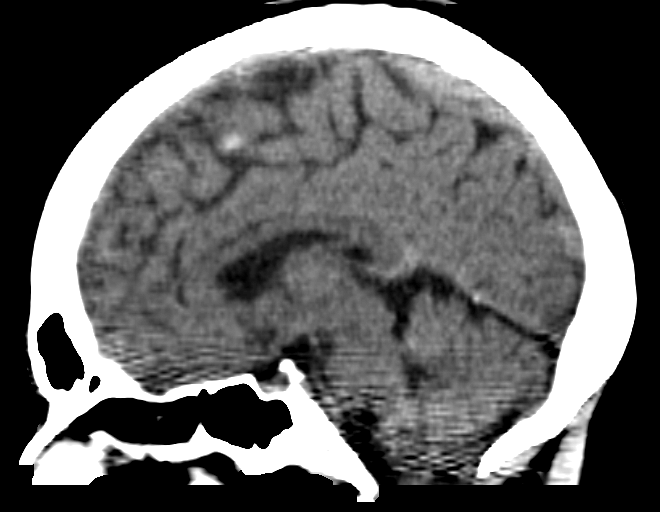
[im 29/44  brain]
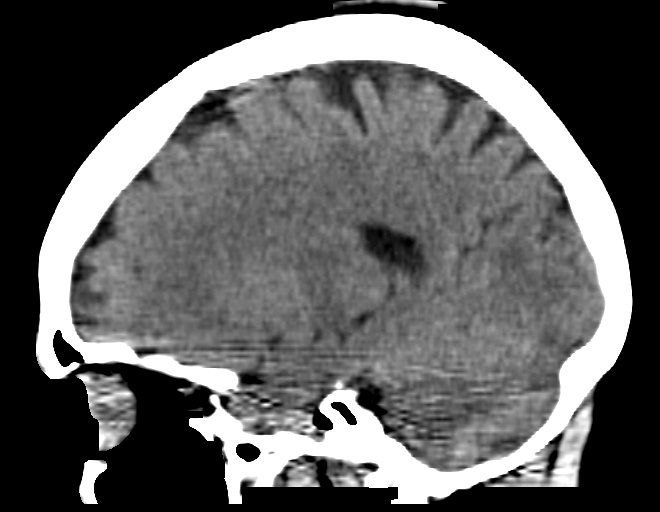

[16 of 47 positions shown; findings below may reference images not displayed]

FINDINGS: INTRACRANIAL CONTENTS: The ventricles and sulci are normal. No
intraparenchymal hemorrhage, mass effect nor midline shift. No acute
large vascular territory infarcts. Minimal anterior falx dural
calcifications. No abnormal extra-axial fluid collections. Basal
cisterns are patent.

ORBITS: The included ocular globes and orbital contents are normal.

SINUSES: The mastoid aircells and included paranasal sinuses are
well-aerated.

SKULL/SOFT TISSUES: No skull fracture. No significant soft tissue
swelling.
IMPRESSION: Negative CT HEAD.

## 2015-10-08 MED ORDER — RISPERIDONE 1 MG PO TABS
2.0000 mg | ORAL_TABLET | Freq: Every day | ORAL | Status: DC
  Administered 2015-10-08: 2 mg via ORAL
  Filled 2015-10-08: qty 2

## 2015-10-08 MED ORDER — ACETAMINOPHEN 500 MG PO TABS
1000.0000 mg | ORAL_TABLET | Freq: Four times a day (QID) | ORAL | Status: DC | PRN
  Administered 2015-10-08 – 2015-10-09 (×3): 1000 mg via ORAL
  Filled 2015-10-08 (×3): qty 2

## 2015-10-08 MED ORDER — OLANZAPINE 10 MG PO TABS: 5.0000 mg | ORAL_TABLET | Freq: Every day | ORAL | Status: DC

## 2015-10-08 MED ORDER — IBUPROFEN 800 MG PO TABS: 800.0000 mg | ORAL_TABLET | Freq: Four times a day (QID) | ORAL | Status: DC | PRN

## 2015-10-08 NOTE — Progress Notes (Signed)
Neuro checks completed on patient, no issues noted post fall. Alert and orient x4. No headaches, blurred vision, nausea or vomiting. Dressing to chin in tact. VSS. Has complained of jaw pain. Meds given with good relief Med and group compliant. Has not mentioned being pregnant. Gait steady. Encouragement and support offered. Neuro checks at least q 2 hours completed. Pt remains safe on unit with q 15 min checks.

## 2015-10-08 NOTE — Progress Notes (Addendum)
D: patient approached nursing unit at window and turned and fell on face. Patient turned over . Assessment done . Patient breathing 12 per minute, pulse 110, pulse ox 100 percent,127/88,CBG 88 . Patient was arousable and states she was dizzy upon fall. Patient has a gash 1/2 inch long on  chin,red mark on left knee without pain or skin opening. chin bleeding lightly and bandaid applied .[facility] called ,Merchandiser, retail. Dr Weber Cooks called stat .CTscan ordered,advil 600 mg ordered. Patient has trouble opening jaw due to pain. Stephanie cleaned wound with benzoin tincture and steri strips applied. Patient sent to CT scan via wheelchair,  . Patient escorted to [facility] wheelchair  Fall protocol followed.  A:  Ordered medication given  Tylenol 650 mg  po tolerated well although having trouble opening jaw. Pain with opening jaw noted. Patient returned to unit and returned to room ,siderails up x 4 ,bed low ,alarm applied on patient if she should get up alarm will go off.patient s room close to nurses station.yellow socks applied,pushed fluids on patient . She drank 1 cup of water tolerated well R: patient tolerated CTscan well, tolerated tylenol well, see flow sheet for vital sign protocol Patient reports dizziness has diminished and put in bed with bed alarm

## 2015-10-08 NOTE — Progress Notes (Addendum)
Called to evaluate pt. after falling to floor in front of nurses station. Pt. with laceration approximately 1/2 to 3/4 inch in length immediatly below right side of chin. Scant bleeding noted. Cleaned with betadine. Benzoin above and below laceration before steri-strips applied.  Pt. states, "That medicine is too strong for me".  Complaining of pain left jaw area. ROM good jaw without obvious dislocation noted. Staff aware.

## 2015-10-08 NOTE — BHH Group Notes (Signed)
Goals Group  Date/Time: 10/08/15 9am  Type of Therapy and Topic: Group Therapy: Goals Group: SMART Goals   Pt was called, but did not attend   Alphonse Guild. Carlen Rebuck, LCSWA, LCAS

## 2015-10-08 NOTE — BHH Group Notes (Signed)
[name] Notes:  (Nursing/MHT/Case Management/Adjunct)  Date:  10/08/2015  Time:  3:16 AM  Type of Therapy:  Psychoeducational Skills  Participation Level:  Did Not Attend  Summary of Progress/Problems:  Diamond Lucas 10/08/2015, 3:16 AM

## 2015-10-08 NOTE — Care Management (Signed)
Diamond Lucas asked to speak with the Chaplain for the opportunity to voice some of her concerns. Diamond Lucas still believes that she is fine and that it is best for to go home. In line with the diagnosis, Diamond Lucas still seems delusional in her conversation with unrealistic expectations and assertions. Diamond Lucas would like for the Chaplain to ask can she leave today and/or can he go to roll up her windows in her car because it may rain. The Chaplain followed up with the Care Team and was told that Diamond Lucas will be discharged tomorrow to return home. The Chaplain will communicate that information and remain present in case Diamond Lucas needs anything else.

## 2015-10-08 NOTE — BHH Group Notes (Signed)
[facility] LCSW Group Therapy   10/08/2015  1pm  Type of Therapy: Group Therapy   Participation Level: Did Not Attend. Patient invited to participate but declined.    Alphonse Guild. Maryln Eastham, MSW, LCSWA, LCAS

## 2015-10-08 NOTE — Plan of Care (Signed)
Problem: Education: Goal: Mental status will improve Outcome: Progressing Patient has not mentioned being pregnant with bats

## 2015-10-08 NOTE — Progress Notes (Signed)
D: Patient is alert and oriented on the unit this shift. Patient not  attended   groups today. Patient denies suicidal ideation, homicidal ideation, auditory or visual hallucinations at the present time.  A: Scheduled medications are administered to patient as per MD orders. Emotional support and encouragement are provided. Patient is maintained on q.15 minute safety checks. Patient is informed to notify staff with questions or concerns. R: No adverse medication reactions are noted. Patient is cooperative with medication administration and partial treatment plan today. Patient  Anxious but  cooperative on the unit at this time. Patient does not  Interact with others on the unit this shift. Patient contracts for safety at this time. Patient remains safe at this time. Anxiety 9/10,depression 2/10

## 2015-10-08 NOTE — Progress Notes (Signed)
Recreation Therapy Notes  Date: 07.13.17 Time: 9:30 am Location: Craft Room  Group Topic: Leisure Education  Goal Area(s) Addresses:  Patient will identify activities for each letter of the alphabet. Patient will verbalize ability to use leisure as a Technical sales engineer.  Behavioral Response: Did not attend  Intervention: Leisure Alphabet  Activity: Patients were given a Leisure Air traffic controller and instructed to identify a leisure activity for each letter of the alphabet.   Education: LRT educated patients on what they need to participate in leisure.  Education Outcome: Patient did not attend group.  Clinical Observations/Feedback: Patient did not attend group.  Leonette Monarch, LRT/CTRS 10/08/2015 10:16 AM

## 2015-10-08 NOTE — Plan of Care (Signed)
Problem: Coping: Goal: Ability to verbalize frustrations and anger appropriately will improve Outcome: Not Progressing Patient not able to verbalize frustrations at this time [place] RN   

## 2015-10-08 NOTE — BHH Group Notes (Signed)
[place] Group Notes:  (Nursing/MHT/Case Management/Adjunct)  Date:  10/08/2015  Time:  4:05 PM  Type of Therapy:  Psychoeducational Skills  Participation Level:  Did Not Attend   Adela Lank [facility] 10/08/2015, 4:05 PM

## 2015-10-08 NOTE — Plan of Care (Signed)
Problem: Education: Goal: Verbalization of understanding the information provided will improve Outcome: Not Progressing Patient instructed to use call bell when needing nurse but she did not do this and ambulated to nursing station and felll face first due to vertigo. Needs reinforcement to call on call bell for assist if not feeling well, she states understanding not to get up without help [place] RN

## 2015-10-08 NOTE — Progress Notes (Signed)
[facility] MD Progress Note  10/08/2015 8:55 AM Diamond Lucas  MRN:  [id number] Subjective:  Patient states that the medication she received last night (olanzapine 10 mg daily at bedtime) was too strong for her. She says she woke up with difficulties breathing and decided to get up to call the nurse and that is when she fell. The patient has a small laceration in her chin (sterile strips were applied).  Patient denies depressed mood, major problems with his sleep, appetite, or concentration. Denies suicidality, homicidality or auditory or visual hallucinations. Patient did not voice any delusions morning.  D: patient approached nursing unit at window and turned and fell on face. Patient turned over . Assessment done . Patient breathing 12 per minute, pulse 110, pulse ox 100 percent,127/88,CBG 88 . Patient was arousable and states she was dizzy upon fall. Patient has a gash 1/2 inch long on chin,red mark on left knee without pain or skin opening. chin bleeding lightly and bandaid applied .Lucky Cowboy called ,Production assistant, radio. Dr Weber Cooks called stat .CTscan ordered,advil 600 mg ordered. Patient has trouble opening jaw due to pain. Stephanie cleaned wound with benzoin tincture and steri strips applied. Patient sent to CT scan via wheelchair, . Patient escorted to [facility] wheelchair Fall protocol followed.  A: Ordered medication given Tylenol 650 mg po tolerated well although having trouble opening jaw. Pain with opening jaw noted. Patient returned to unit and returned to room ,siderails up x 4 ,bed low ,alarm applied on patient if she should get up alarm will go off.patient s room close to nurses station.yellow socks applied,pushed fluids on patient . She drank 1 cup of water tolerated well R: patient tolerated CTscan well, tolerated tylenol well, see flow sheet for vital sign protocol Patient reports dizziness has diminished and put in bed with bed alarm   Principal Problem: Delusional disorder,  somatic type [facility]) Diagnosis:   Patient Active Problem List   Diagnosis Date Noted  . Delusional disorder, somatic type ([place]) [F22] 10/07/2015  . HTN (hypertension) [I10] 10/07/2015  . Noncompliance [Z91.19] 10/06/2015  . Personal history of malignant neoplasm of thyroid [Z85.850] 02/09/2015  . Rosacea [L71.9] 02/09/2015  . Rhinitis, allergic [J30.9] 12/26/2014  . 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 H/O thyroidectomy [E89.0] 05/23/2014  . 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 Reflux [K21.9] 09/26/2012   Total Time spent with patient: 30 minutes  Past Psychiatric History:   Past Medical History:  Past Medical History  Diagnosis Date  . Osteoarthritis   . Allergy   . History of syncope   . History of chicken pox   . History of measles   . Rosacea   . Arrhythmia   . Thyroid disease   . Schizophrenia [facility])     Past Surgical History  Procedure Laterality Date  . Thyroidectomy    . Foot surgery Right    Family History:  Family History  Problem Relation Age of Onset  . Depression Mother   . Cataracts Mother   . Cancer Other   . Heart failure Other   . Heart disease Other   . Stroke Other   . Diabetes Other   . Kidney cancer Father   . Anxiety disorder Brother    Family Psychiatric  History:  Social History:  History  Alcohol Use No     History  Drug Use No    Social History   Social History  . Marital Status: Single    Spouse  Name: N/A  . Number of Children: N/A  . Years of Education: N/A   Social History Main Topics  . Smoking status: Never Smoker   . Smokeless tobacco: Never Used  . Alcohol Use: No  . Drug Use: No  . Sexual Activity: Not Currently    Birth Control/ Protection: Post-menopausal   Other Topics Concern  . None   Social History Narrative     Current Medications: Current Facility-Administered Medications  Medication Dose Route Frequency Provider  Last Rate Last Dose  . acetaminophen (TYLENOL) tablet 650 mg  650 mg Oral Q6H PRN Gonzella Lex, MD   650 mg at 10/08/15 0252  . alum & mag hydroxide-simeth (MAALOX/MYLANTA) 200-200-20 MG/5ML suspension 30 mL  30 mL Oral Q4H PRN Gonzella Lex, MD      . levothyroxine (SYNTHROID, LEVOTHROID) tablet 75 mcg  75 mcg Oral QAC breakfast Gonzella Lex, MD   75 mcg at 10/08/15 0700  . magnesium hydroxide (MILK OF MAGNESIA) suspension 30 mL  30 mL Oral Daily PRN Gonzella Lex, MD      . OLANZapine (ZYPREXA) tablet 10 mg  10 mg Oral QHS Hildred Priest, MD   10 mg at 10/07/15 2135    Lab Results:  Results for orders placed or performed during the hospital encounter of 10/06/15 (from the past 48 hour(s))  hCG, quantitative, pregnancy     Status: None   Collection Time: 10/07/15  3:27 PM  Result Value Ref Range   hCG, Beta Chain, Quant, S 3 <5 mIU/mL    Comment:          GEST. AGE      CONC.  (mIU/mL)   <=1 WEEK        5 - 50     2 WEEKS       50 - 500     3 WEEKS       100 - 10,000     4 WEEKS     1,000 - 30,000     5 WEEKS     3,500 - 115,000   6-8 WEEKS     12,000 - 270,000    12 WEEKS     15,000 - 220,000        FEMALE AND NON-PREGNANT FEMALE:     LESS THAN 5 mIU/mL   Lipid panel     Status: Abnormal   Collection Time: 10/07/15  3:35 PM  Result Value Ref Range   Cholesterol 275 (H) 0 - 200 mg/dL   Triglycerides 106 <150 mg/dL   HDL 90 >40 mg/dL   Total CHOL/HDL Ratio 3.1 RATIO   VLDL 21 0 - 40 mg/dL   LDL Cholesterol 164 (H) 0 - 99 mg/dL    Comment:        Total Cholesterol/HDL:CHD Risk Coronary Heart Disease Risk Table                     Men   Women  1/2 Average Risk   3.4   3.3  Average Risk       5.0   4.4  2 X Average Risk   9.6   7.1  3 X Average Risk  23.4   11.0        Use the calculated Patient Ratio above and the CHD Risk Table to determine the patient's CHD Risk.        ATP III CLASSIFICATION (LDL):  <100     mg/dL  Optimal  100-129  mg/dL   Near or  Above                    Optimal  130-159  mg/dL   Borderline  160-189  mg/dL   High  >190     mg/dL   Very High   Prolactin     Status: None   Collection Time: 10/07/15  3:35 PM  Result Value Ref Range   Prolactin 10.7 4.8 - 23.3 ng/mL    Comment: (NOTE) Performed At: [facility] [place], [place] [id number] Lindon Romp MD [id number]   TSH     Status: Abnormal   Collection Time: 10/07/15  3:35 PM  Result Value Ref Range   TSH 0.282 (L) 0.350 - 4.500 uIU/mL  Glucose, capillary     Status: None   Collection Time: 10/08/15  2:16 AM  Result Value Ref Range   Glucose-Capillary 88 65 - 99 mg/dL    Blood Alcohol level:  No results found for: [facility]  Metabolic Disorder Labs: No results found for: HGBA1C, MPG Lab Results  Component Value Date   PROLACTIN 10.7 10/07/2015   Lab Results  Component Value Date   CHOL 275* 10/07/2015   TRIG 106 10/07/2015   HDL 90 10/07/2015   CHOLHDL 3.1 10/07/2015   VLDL 21 10/07/2015   LDLCALC 164* 10/07/2015    Physical Findings: AIMS: Facial and Oral Movements Muscles of Facial Expression: None, normal Lips and Perioral Area: None, normal Jaw: None, normal Tongue: None, normal,Extremity Movements Upper (arms, wrists, hands, fingers): None, normal Lower (legs, knees, ankles, toes): None, normal, Trunk Movements Neck, shoulders, hips: None, normal, Overall Severity Severity of abnormal movements (highest score from questions above): None, normal Incapacitation due to abnormal movements: None, normal Patient's awareness of abnormal movements (rate only patient's report): No Awareness, Dental Status Current problems with teeth and/or dentures?: No Does patient usually wear dentures?: No  CIWA:    COWS:     Musculoskeletal: Strength & Muscle Tone: within normal limits Gait & Station: normal Patient leans: N/A  Psychiatric Specialty Exam: Physical Exam  Constitutional: She is oriented to person, place,  and time. She appears well-developed and well-nourished.  HENT:  Head: Normocephalic.  Eyes: EOM are normal.  Neck: Normal range of motion.  Respiratory: Effort normal.  Musculoskeletal: Normal range of motion.  Neurological: She is alert and oriented to person, place, and time.    Review of Systems  Constitutional: Negative.   HENT: Negative.   Eyes: Negative.   Respiratory: Negative.   Cardiovascular: Negative.   Gastrointestinal: Negative.   Genitourinary: Negative.   Musculoskeletal: Negative.   Skin: Negative.   Neurological: Negative.   Endo/Heme/Allergies: Negative.   Psychiatric/Behavioral: Negative.     Blood pressure 120/72, pulse 79, temperature 97.7 F (36.5 C), temperature source Oral, resp. rate 18, height 5\' 1"  (1.549 m), weight 44.906 kg (99 lb), last menstrual period 03/29/2015, SpO2 100 %.Body mass index is 18.72 kg/(m^2).  General Appearance: Well Groomed  Eye Contact:  Good  Speech:  Clear and Coherent  Volume:  Normal  Mood:  Anxious and Dysphoric  Affect:  Appropriate  Thought Process:  Linear and Descriptions of Associations: Intact  Orientation:  Full (Time, Place, and Person)  Thought Content:  Delusions  Suicidal Thoughts:  No  Homicidal Thoughts:  No  Memory:  Immediate;   Good Recent;   Good Remote;   Good  Judgement:  Impaired  Insight:  Lacking  Psychomotor Activity:  Normal  Concentration:  Concentration: Good and Attention Span: Good  Recall:  Good  Fund of Knowledge:  Good  Language:  Good  Akathisia:  No  Handed:    AIMS (if indicated):     Assets:  Communication Skills Housing Physical Health  ADL's:  Intact  Cognition:  WNL  Sleep:  Number of Hours: 7.15     Treatment Plan Summary:  55 year old Caucasian female presenting with bizarre somatic delusions of having a bat and a child in her uterus for months. Patient has been reassured by her primary care doctor, only G and by our emergency department however she continues  to insist that there is something in her uterus that needs to be removed before she can travel to [place].  Delusional disorder the patient was started on olanzapine 10 mg by mouth daily at bedtime however she refused this medication on 7/11. She agrees with taking it last night however after taking 10 mg patient had a fall.  I have decreased the dose of olanzapine to 5 mg daily at bedtime.   I suspect that delusional thinking has exacerbated lately after she learned that the federal government is claiming she was overpaid for a [employer] she file years ago. Patient is telling me they are saying she was overpaid by $[phone].  Patient also appears to be quite concerned about having to travel to [place] to take her mother for an eye surgery.  Pelvic and abdominal ultrasound are unremarkable  Pregnancy test, HCG is pending.  Hypothyroidism continue Synthroid 75 g daily.  Diet regular  Hospitalization and status continue patient on involuntary commitment  Labs I will order a lipid panel, hemoglobin A1c, prolactin level, pregnancy test  I have ordered today the transvaginal ultrasound but this is the second time patient has refused transvaginal. A pelvic ultrasound was completed and found  unremarkable   Disposition once stable she will be discharged back to her apartment  Discharge follow-up she'll continue to follow-up with her outpatient psychiatrist.    Hildred Priest, MD 10/08/2015, 8:55 AM

## 2015-10-09 MED ORDER — RISPERIDONE 2 MG PO TABS: 2.0000 mg | ORAL_TABLET | Freq: Every day | ORAL | Status: DC

## 2015-10-09 NOTE — Tx Team (Signed)
Interdisciplinary Treatment Plan Update (Adult)         Date: 10/09/2015   Time Reviewed: 10:30 AM   Progress in Treatment: Improving Attending groups: Yes  Participating in groups: Yes  Taking medication as prescribed: Yes  Tolerating medication: Yes  Family/Significant other contact made: Patient refused family contact. Patient understands diagnosis: Yes  Discussing patient identified problems/goals with staff: Yes  Medical problems stabilized or resolved: Yes  Denies suicidal/homicidal ideation: Yes  Issues/concerns per patient self-inventory: Yes  Other:   New problem(s) identified: N/A   Discharge Plan or Barriers: see below   Reason for Continuation of Hospitalization:   Depression   Anxiety   Medication Stabilization   Comments: N/A   Estimated length of stay: 7 days    Patient is a 55 year old female admitted for paranoid symptoms. Patient lives in [place], [place]. 55 year old woman with a history of schizophrenia presented to the emergency room where she was referred by her primary care doctor. Patient states that she is convinced that she has a bat living inside her body. She says that she believes this has happened because there are bats living in the attic of her apartment building and that [place] has dripped upon her. She says that they do that just like "little man". Patient says that she can feel it in there and absolutely insists that someone give her an abortion. She says she has been having this feeling for several months. It appears that she has been going to multiple doctors with these complaints and has even had an ultrasound to prove that it is not true but she persists in coming to the hospital. Patient admits that she did not tell about this symptom when she saw her recently. She is not taking any psychiatric medicine. Patient is hesitant to divulge much more history. Admits that she feels anxious. Denies any suicidal intent. Appears to be avoiding  psychiatric treatment and probably misusing other medications because of her physical delusions. Patient will benefit from crisis stabilization, medication evaluation, group therapy, and psycho education in addition to case management for discharge planning. Patient and CSW reviewed pt's identified goals and treatment plan. Pt verbalized understanding and agreed to treatment plan.    Review of initial/current patient goals per problem list:  1. Goal(s): Patient will participate in aftercare plan   Met: Yes  Target date: 3-5 days post admission date   As evidenced by: Patient will participate within aftercare plan AEB aftercare provider and housing plan at discharge being identified.   10/07/15: Assessing proper aftercare plans. 10/09/15: Patient will f/u with [facility]. Goal (s): Patient will exhibit decreased depressive symptoms and suicidal ideations.   Met: Yes  Target date: 3-5 days post admission date   As evidenced by: Patient will utilize self-rating of depression at 3 or below and demonstrate decreased signs of depression or be deemed stable for discharge by MD.   Pt denies SI/HI.  Pt reports no depressive symptoms.   3. Goal(s): Patient will demonstrate decreased signs and symptoms of anxiety.   Met: Yes  Target date: 3-5 days post admission date   As evidenced by: Patient will utilize self-rating of anxiety at 3 or below and demonstrated decreased signs of anxiety, or be deemed stable for discharge by MD.  Patient denies symptoms of anxiety. Adequate for discharge per MD.    5. Goal(s): Patient will demonstrate decreased signs of psychosis  * Met: Yes * Target date: 3-5 days post admission  date  * As evidenced by: Patient will demonstrate decreased frequency of AVH or return to baseline function   Patient denies AVH at this time.   Attendees:  Patient:  Family:  Physician: Dr. Merlyn Albert , MD    10/09/2015 9:30 AM  Nursing: Polly Cobia , RN      10/09/2015 10:30 AM  Clinical Social Worker: Emilie Rutter, LCSWA  10/09/2015 10:30 AM  Other: Marylou Flesher, [place]    10/09/2015 10:30 AM  Other:  Everitt Amber, Recreational Therapist   10/09/2015 10:30 AM  Other:        10/09/2015 10:30 AM  Other:        10/09/2015 10:30 AM

## 2015-10-09 NOTE — BHH Suicide Risk Assessment (Signed)
[facility] Discharge Suicide Risk Assessment   Principal Problem: Delusional disorder, somatic type [facility]) Discharge Diagnoses:  Patient Active Problem List   Diagnosis Date Noted  . Delusional disorder, somatic type ([place]) [F22] 10/07/2015  . HTN (hypertension) [I10] 10/07/2015  . Noncompliance [Z91.19] 10/06/2015  . Personal history of malignant neoplasm of thyroid [Z85.850] 02/09/2015  . Rosacea [L71.9] 02/09/2015  . Rhinitis, allergic [J30.9] 12/26/2014  . 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 H/O thyroidectomy [E89.0] 05/23/2014  . 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 Reflux [K21.9] 09/26/2012    Psychiatric Specialty Exam: ROS  Blood pressure 130/91, pulse 89, temperature 98.3 F (36.8 C), temperature source Oral, resp. rate 18, height 5\' 1"  (1.549 m), weight 44.906 kg (99 lb), last menstrual period 03/29/2015, SpO2 99 %.Body mass index is 18.72 kg/(m^2).                                                       Mental Status Per Nursing Assessment::   On Admission:  NA  Demographic Factors:  Caucasian and Living alone  Loss Factors: Financial problems/change in socioeconomic status  Historical Factors: NA  Risk Reduction Factors:   NA  Continued Clinical Symptoms:  Previous Psychiatric Diagnoses and Treatments  Cognitive Features That Contribute To Risk:  None    Suicide Risk:  Minimal: No identifiable suicidal ideation.  Patients presenting with no risk factors but with morbid ruminations; may be classified as minimal risk based on the severity of the depressive symptoms  Follow-up Information    Follow up with [facility] . Go on 10/15/2015.   Why:  Please arrive to your therapy appointment with Elmyra Ricks at [facility] July 20th @ 2pm. We ask that you arrive 15 minutes early for prompt services. If you have any questions or  concerns, contact [place].   Contact information:    [address], [place], [place] [address] Hours: 8:30AM-5PM Phone: [phone]      Follow up with [facility] . Go on 11/03/2015.   Why:  Please arrive to your appointment August 8th with your psychiatrist, Dr. Einar Grad @ 4pm. We ask that you arrive 15 minutes early for prompt services. If you have any questions or concerns, contact [place].    Contact information:    [address], [place], [place] [address] Hours: 8:30AM-5PM Phone: [phone]      Hildred Priest, MD 10/09/2015, 9:01 AM

## 2015-10-09 NOTE — Plan of Care (Signed)
Problem: Safety: Goal: Periods of time without injury will increase Outcome: Progressing Patient has gone without a fall this shift.
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
## 2015-10-09 NOTE — [facility] Group Notes (Signed)
[facility] Notes:  (Nursing/MHT/Case Management/Adjunct)  Date:  10/09/2015  Time:  2:38 AM  Type of Therapy:  Group Therapy  Participation Level:  Did Not Attend   Summary of Progress/Problems:  Diamond Lucas 10/09/2015, 2:38 AM

## 2015-10-09 NOTE — Progress Notes (Signed)
Recreation Therapy Notes  Date: 07.14.17 Time: 9:30 am Location: Craft Room  Group Topic: Coping Skills  Goal Area(s) Addresses:  Patient will participate in healthy coping skill. Patient will verbalize one emotion experienced during group.  Behavioral Response: Attentive, Left early  Intervention: Coloring  Activity: Patients were given coloring sheets and instructed to color while thinking about the emotions they were feeling and what their mind was focused on.  Education: LRT educated patients on healthy coping skills.  Education Outcome: Patient left before LRT educated group.   Clinical Observations/Feedback: Patient worked on patient survey. Patient colored coloring sheet. Patient left group at approximately 10:00 am with Dr. Jerilee Hoh. Patient did not return to group.  Leonette Monarch, LRT/CTRS 10/09/2015 10:27 AM

## 2015-10-09 NOTE — Progress Notes (Signed)
Pleasant and cooperative.  Denies SI/HI/AVH.   Discharge instructions given, verbalized understanding.  Prescriptions given.  Personal belongings and stored mediations returned.  Escorted off unit by this Probation officer to main entrance to meet taxi to travel home.

## 2015-10-09 NOTE — Progress Notes (Signed)
D: Patient appears gaurded. Still asking for an oncall obstetrician. Denies SI/HI/AVH. States she's having pain in her jaw, 8/10 from previous fall. Patient is very reluctant about taking Risperdal.  A: Medication given with education. Encouragement provided.  R: Patient was reluctant but she took the Risperdal. She has remained calm and cooperative. Safety maintained with 15 min checks.

## 2015-10-09 NOTE — Progress Notes (Signed)
  [facility] Adult Case Management Discharge Plan :  Will you be returning to the same living situation after discharge:  Yes,  home At discharge, do you have transportation home?: Yes,  taxi Do you have the ability to pay for your medications: Yes,  insurance  Release of information consent forms completed and in the chart;  Patient's signature needed at discharge.  Patient to Follow up at: Follow-up Information    Follow up with  [facility] . Go on 10/15/2015.   Why:  Please arrive to your therapy appointment with Elmyra Ricks at [facility] July 20th @ 2pm. We ask that you arrive 15 minutes early for prompt services. If you have any questions or concerns, contact [facility].   Contact information:    [address], [place], [place] [address] Hours: 8:30AM-5PM Phone: [phone]      Follow up with [facility] . Go on 11/03/2015.   Why:  Please arrive to your appointment August 8th with your psychiatrist, Dr. Einar Grad @ 4pm. We ask that you arrive 15 minutes early for prompt services. If you have any questions or concerns, contact [facility].    Contact information:    [address], [place], [place] [address] Hours: 8:30AM-5PM Phone: [phone]      Next level of care provider has access to [facility] and Suicide Prevention discussed: Yes,  reviewed with patient  Have you used any form of tobacco in the last 30 days? (Cigarettes, Smokeless Tobacco, Cigars, and/or Pipes): No  Has patient been referred to the Quitline?: N/A patient is not a smoker  Patient has been referred for addiction treatment: [place], LCSW-A 10/09/2015, 9:15 AM

## 2015-10-14 ENCOUNTER — Encounter: Payer: Self-pay | Admitting: Obstetrics and Gynecology

## 2015-10-14 ENCOUNTER — Ambulatory Visit (INDEPENDENT_AMBULATORY_CARE_PROVIDER_SITE_OTHER): Payer: Medicare Other | Admitting: Obstetrics and Gynecology

## 2015-10-14 VITALS — BP 138/82 | HR 72 | Ht 61.0 in | Wt 98.8 lb

## 2015-10-14 DIAGNOSIS — F458 Other somatoform disorders: Secondary | ICD-10-CM | POA: Diagnosis not present

## 2015-10-15 ENCOUNTER — Ambulatory Visit (INDEPENDENT_AMBULATORY_CARE_PROVIDER_SITE_OTHER): Payer: Medicare Other | Admitting: Licensed Clinical Social Worker

## 2015-10-15 DIAGNOSIS — F200 Paranoid schizophrenia: Secondary | ICD-10-CM

## 2015-10-16 ENCOUNTER — Telehealth: Payer: Self-pay | Admitting: Physician Assistant

## 2015-10-16 NOTE — Telephone Encounter (Signed)
error 

## 2015-10-19 NOTE — Progress Notes (Signed)
   GYNECOLOGY CLINIC PROGRESS NOTE  Subjective:     Diamond Lucas is a 55 y.o. female with a h/o paranoid schizophrenia (currently on no meds) and delusion of pregnancy who presents with complaints of pregnancy as well as "bat infestation in her uterus".  Patient is not sexually active, and has not been in several years.  Continues to note that she was impregnated by "super sperm" dropped on her by a a strange man in [employer].  She desires to have the bats evacuated but asks that we save her babies.   Of note, states that she was seen by an OB/GYN at [facility] who performed a dilation.  Also states that she was given a medication to help her dilate.  Wonders if she needs to take this again.  Desires to be dilated again as the bats did not leave.   Patient also notes she was seen in the Emergency Room for her symptoms, was admitted to the Psychiatric ward for several days and then discharged.  Reports having a fall while there where she bruised her face.   The patient was given HRT last visit for menopausal symptoms, but notes that after reading the side effects that she decided against taking it.   I have reviewed the patient's medical, surgical, social, allergies, and medication history in detail and updated the computerized patient record.     Review of Systems Pertinent items noted in HPI and remainder of comprehensive ROS otherwise negative.    Objective:    BP 138/82   Pulse 72   Ht 5\' 1"  (1.549 m)   Wt 98 lb 12.8 oz (44.8 kg)   LMP 03/29/2015   BMI 18.67 kg/m  General:   alert and no distress  Abdomen:    Soft, non-tender, no distention or masses palpable  Pelvis:  External genitalia: normal general appearance Urinary system: urethral meatus normal Vaginal: atrophic mucosa Cervix: cervical stenosis with pinpoint os Adnexa: normal bimanual exam Uterus: normal single, nontender   Extremities:   extremities normal, atraumatic, no cyanosis or edema  Neurologic:   negative    Psychiatric:   delusions present, denies HI/SI    Assessment:   Delusions of pregnancy    Plan:   Attempted again to inform patient that per last ultrasound and testing, her uterus was clear.  Patient still adamant about having bats removed.  Discussed that another dilation could be performed.  Patient agrees to having this done to allow the "bats to escape".  Injected cervix at 2, 4, 8, and 10 o'clock with 11% lidocaine.  Cervix grasped with tenaculum and dilated with lacrimal duct dilators. Tenaculum removed with bleeding at site.  Hemostasis controlled with silver nitrate stick.  Patient advised to f/u in 2 weeks.    [name], MD [facility]

## 2015-10-20 ENCOUNTER — Encounter: Payer: Self-pay | Admitting: Physician Assistant

## 2015-10-20 ENCOUNTER — Ambulatory Visit (INDEPENDENT_AMBULATORY_CARE_PROVIDER_SITE_OTHER): Payer: Medicare Other | Admitting: Physician Assistant

## 2015-10-20 VITALS — BP 108/70 | HR 64 | Temp 97.7°F | Resp 16 | Wt 99.6 lb

## 2015-10-20 DIAGNOSIS — F22 Delusional disorders: Secondary | ICD-10-CM

## 2015-10-20 DIAGNOSIS — R1084 Generalized abdominal pain: Secondary | ICD-10-CM

## 2015-10-20 NOTE — Progress Notes (Signed)
Patient: Diamond Lucas Female    DOB: 22-Jun-1960   55 y.o.   MRN: [id number] Visit Date: 10/20/2015  Today's Provider: Mar Daring, PA-C   Chief Complaint  Patient presents with  . Possible Pregnancy   Subjective:    HPI Patient is here with c/o of possible pregnancy. She reports that she may have multiple pregnancies. She states that she is 99.9% sure that she is pregnant. She wants to be tested because she needs to get [name]. She reports that a man put a "super sperm" that looks like paper mache on her back in [employer]. She also feels that men come in and out of her apartment occasionally and ejaculate on her furniture. She also mentions that she has been babysitting a young boy. He took a nap in her bed. She cleaned up something she thought was vomitus but is now not sure. She states she only used paper towels and may have got something on her hands.   She also reports that she is not taking Risperidone because she is afraid is going to hurt the baby.  She has had multiple urine pregnancy test, blood test, ultrasounds and also recently had a pelvic exam with dilation by Dr. Marcelline Mates. All work-up has been normal and negative for pregnancy.     Allergies  Allergen Reactions  . Diclofenac Sodium Palpitations  . Naproxen Itching, Rash and Hives  . Dust Mite Mixed Allergen Ext [Mite (D. Farinae)] Itching    Respiratory illness, and itchy eyes, eye problems  . Nsaids     Other reaction(s): Other (See Comments)  . Other Itching    Respiratory illness, and itchy eyes, eye problems   Current Meds  Medication Sig  . levothyroxine (SYNTHROID, LEVOTHROID) 75 MCG tablet TAKE 1 TABLET BY MOUTH DAILY    Review of Systems  Constitutional: Negative for fatigue and unexpected weight change.  Respiratory: Negative.   Cardiovascular: Negative.   Gastrointestinal: Positive for abdominal distention (She reports she feels fuller and that her abdomen feels more enlarge.).  Negative for abdominal pain, constipation, diarrhea and nausea.  Genitourinary: Negative.     Social History  Substance Use Topics  . Smoking status: Never Smoker  . Smokeless tobacco: Never Used  . Alcohol use No   Objective:   BP 108/70 (BP Location: Left Arm, Patient Position: Sitting, Cuff Size: Small)   Pulse 64   Temp 97.7 F (36.5 C) (Oral)   Resp 16   Wt 99 lb 9.6 oz (45.2 kg)   LMP 03/29/2015   BMI 18.82 kg/m   Physical Exam  Constitutional: She is oriented to person, place, and time. She appears well-developed and well-nourished. No distress.  Cardiovascular: Normal rate, regular rhythm and normal heart sounds.  Exam reveals no gallop and no friction rub.   No murmur heard. Pulmonary/Chest: Effort normal and breath sounds normal. No respiratory distress. She has no wheezes. She has no rales.  Abdominal: Soft. Normal appearance and bowel sounds are normal. She exhibits no distension and no mass. There is no hepatosplenomegaly. There is generalized tenderness. There is no rebound, no guarding and no CVA tenderness.  Neurological: She is alert and oriented to person, place, and time.  Skin: Skin is warm and dry. She is not diaphoretic.  Vitals reviewed.     Assessment & Plan:     1. Delusional disorder, somatic type [facility]) She is still having delusions of pregnancy. I discussed with her in detail  how it was very unlikely for her to be pregnant and that she is postmenopausal as well. We discussed what postmenopausal meant also as well as what happens to the body when you are postmenopausal. She voiced understanding but still stated that she felt she was pregnant. She wanted more testing. I discussed that she has had multiple tests, which have all been negative. I informed her we could not continue to look for a pregnancy we can't find because it may do more harm to her than any good. She voiced understanding but left saying she was going to find an obstetrician that  specializes in older women and pregnancy.   2. Generalized abdominal pain Unknown source of pain. Possible delusion. All work up thus far has been negative.        Mar Daring, PA-C  [facility]

## 2015-10-20 NOTE — Progress Notes (Signed)
   THERAPIST PROGRESS NOTE  Session Time: 41  Participation Level: Active  Behavioral Response: Fairly GroomedAlertAnxious  Type of Therapy: Individual Therapy  Treatment Goals addressed: Coping  Interventions: Motivational Interviewing, Solution Focused and Strength-based  Summary: Diamond Lucas is a 55 y.o. female who presents with continued symptoms of her diagnosis.  Discussion of her recent thoughts and feelings.  Discussion of what is "real" and what is not.  Discussion of her current medication regimen and how she takes her medication.  Provided her with coping strategies and skills.  Continued to redirect her to discuss her mental health.  Patient wanted to discuss her disability and worker compensation cases.  Patient states that her workers compensation claim is at [employer] level and a therapist is unable to assist with helping her win.  She wanted to discuss her financial income in debt.  Provided her with a budgeting form and assisted her complete the worksheet.   Suicidal/Homicidal: Nowithout intent/plan  Therapist Response: LCSW provided Patient with ongoing emotional support and encouragement.  Normalized her feelings.  Commended Patient on her progress and reinforced the importance of client staying focused on her own strengths and resources and resiliency. Processed various strategies for dealing with stressors.    Plan: Return again in [place].  Diagnosis: Axis I: Paranoid Schizophrenia    Axis II: No diagnosis    Lubertha South, LCSW 10/20/2015

## 2015-10-21 ENCOUNTER — Telehealth: Payer: Self-pay | Admitting: Physician Assistant

## 2015-10-21 NOTE — Telephone Encounter (Signed)
Called Amy and discussed situation. They will try to redirect her back to [facility] care with myself, Dr. Marcelline Mates or Dr. Einar Grad so that we can keep her care within a few professionals to hopefully keep redirecting and hopefully getting her back on risperdal at some point.

## 2015-10-21 NOTE — Telephone Encounter (Signed)
Amy from [facility] would like a return call to discuss a call that their office received from pt. She believes she is pregnant with bats. Call back # is [phone] ext [phone]

## 2015-11-03 ENCOUNTER — Ambulatory Visit (INDEPENDENT_AMBULATORY_CARE_PROVIDER_SITE_OTHER): Payer: Medicare Other | Admitting: Obstetrics and Gynecology

## 2015-11-03 ENCOUNTER — Encounter: Payer: Self-pay | Admitting: Obstetrics and Gynecology

## 2015-11-03 ENCOUNTER — Ambulatory Visit: Payer: Medicare Other | Admitting: Psychiatry

## 2015-11-03 VITALS — BP 112/76 | HR 58 | Ht 61.0 in | Wt 101.6 lb

## 2015-11-03 DIAGNOSIS — F458 Other somatoform disorders: Secondary | ICD-10-CM | POA: Diagnosis not present

## 2015-11-03 MED ORDER — MISOPROSTOL 200 MCG PO TABS: 400.0000 ug | ORAL_TABLET | Freq: Once | ORAL | 0 refills | Status: DC

## 2015-11-03 NOTE — Telephone Encounter (Signed)
error 

## 2015-11-03 NOTE — Progress Notes (Signed)
    GYNECOLOGY PROGRESS NOTE  Subjective:    Patient ID: Diamond Lucas, female    DOB: [birth year], 55 y.o.   MRN: [id number]  HPI  Patient is a 55 y.o. G0P0000 female with h/o mental illness (currently with delusion of pregnancy) who presents for f/u after attempted cervical dilation for "bats in her abdomen".  Patient reports that she waited after there procedure, but has not had evacuation of the bats.  Desires to know what else she can try.    The following portions of the patient's history were reviewed and updated as appropriate: allergies, current medications, past family history, past medical history, past social history, past surgical history and problem list.  Review of Systems Pertinent items noted in HPI and remainder of comprehensive ROS otherwise negative.   Objective:   Blood pressure 112/76, pulse (!) 58, height 5\' 1"  (1.549 m), weight 101 lb 9.6 oz (46.1 kg), last menstrual period 03/29/2015. General appearance: alert and no distress Exam deferred.    Assessment:   Delusion of pregnancy  Plan:   Discussion had with patient that there are really no treatment options for her current situation.  Patient inquires about a medication that can help the cervix dilate.  Can prescribe 1 dose of Misoprostol.  Discussed that if her situation has not resolved, she may need referral to a facility that can best handle her needs.  Patient notes understanding.   - RTC as needed.

## 2015-11-05 DIAGNOSIS — M791 Myalgia: Secondary | ICD-10-CM | POA: Diagnosis not present

## 2015-11-18 ENCOUNTER — Telehealth: Payer: Self-pay | Admitting: Physician Assistant

## 2015-11-18 DIAGNOSIS — T148XXA Other injury of unspecified body region, initial encounter: Secondary | ICD-10-CM

## 2015-11-18 MED ORDER — TRIAMCINOLONE ACETONIDE 0.1 % EX CREA: 1.0000 "application " | TOPICAL_CREAM | Freq: Two times a day (BID) | CUTANEOUS | 0 refills | Status: DC

## 2015-11-18 NOTE — Telephone Encounter (Signed)
Leave patient a voicemail advising her that RX has been sent to pharmacy and to call back to schedule a appt if it does not help.

## 2015-11-18 NOTE — Telephone Encounter (Signed)
Sent in triamcinolone cream for itch. Will need to be seen to evaluate bites if this doesn't help and bites keep happening.

## 2015-11-18 NOTE — Telephone Encounter (Signed)
Pt is requesting a Rx for SKlice, Pt feels like she has something biting her on her arms, legs and scalp.  [place],.  [id number]

## 2015-11-18 NOTE — Telephone Encounter (Signed)
Please Review.  Thanks,  -Yailine Ballard 

## 2015-11-19 ENCOUNTER — Ambulatory Visit (INDEPENDENT_AMBULATORY_CARE_PROVIDER_SITE_OTHER): Payer: Medicare Other | Admitting: Obstetrics and Gynecology

## 2015-11-19 ENCOUNTER — Encounter: Payer: Self-pay | Admitting: Obstetrics and Gynecology

## 2015-11-19 VITALS — BP 116/72 | HR 82 | Ht 61.0 in | Wt 103.5 lb

## 2015-11-19 DIAGNOSIS — F458 Other somatoform disorders: Secondary | ICD-10-CM

## 2015-11-19 DIAGNOSIS — F200 Paranoid schizophrenia: Secondary | ICD-10-CM

## 2015-11-19 NOTE — Progress Notes (Signed)
    GYNECOLOGY PROGRESS NOTE  Subjective:    Patient ID: Diamond Lucas, female    DOB: 08-23-1960, 55 y.o.   MRN: [id number]  HPI  Patient is a 55 y.o. G0P0000 female who presents for 2 week follow up.  Patient with h/o paranoid schizophrenia and has delusions of pregnancy.  Was given dose of Cytotec last visit to "release the bat babies" that are in her uterus, but notes that they did not come out.  Patient now notes that "the bats are angry because they have eyes and want to see, and are now using echo location and making noise".    Patient also requests a pregnancy test to be performed as she needs proof of pregnancy to be able to get [facility].    The following portions of the patient's history were reviewed and updated as appropriate: allergies, current medications, past family history, past medical history, past social history, past surgical history and problem list.  Review of Systems Pertinent items noted in HPI and remainder of comprehensive ROS otherwise negative.   Objective:   Blood pressure 116/72, pulse 82, height 5\' 1"  (1.549 m), weight 103 lb 8 oz (46.9 kg), last menstrual period 03/29/2015. General appearance: alert, cooperative and no distress Exam deferred.   Assessment:   Paranoid schizophrenia Delusion of pregnancy  Plan:   - Patient with continued delusion of pregnancy with bats despite multiple minimally invasive attempts (including cervical dilation in office and small dose of Cytotec).  Discussed with patient that she would not be a candidate for a surgical intervention (as patient was initially requesting a D&C to rid her of the bats).  Discussed having non-conventional methods, such as herbs, roots, and "coaxing/coaching therapy (i.e. Psychotherapy) to see if she can verbally coax the bats out.  Patient agrees to this.  Given the names of 2 therapists who accept patient's Medicare.   - Attempted to dissuade patient from repeating pregnancy test (as they have  all been negative in the past), but patient adamant about it as she desires WIC.  Pregnancy test performed again negative today, shown to patient.  Patient disappointed, but understands that she will not be able to apply for [facility].  Has rationalized that because they are bats, it may not pick up on human tests.  - To f/u as needed.   A total of 15 minutes were spent face-to-face with the patient during this encounter and over half of that time dealt with counseling and coordination of care.   [name], MD [facility]

## 2015-11-20 ENCOUNTER — Ambulatory Visit (INDEPENDENT_AMBULATORY_CARE_PROVIDER_SITE_OTHER): Payer: Medicare Other | Admitting: Physician Assistant

## 2015-11-20 ENCOUNTER — Encounter: Payer: Self-pay | Admitting: Physician Assistant

## 2015-11-20 VITALS — BP 122/80 | HR 65 | Temp 97.7°F | Resp 14 | Wt 103.8 lb

## 2015-11-20 DIAGNOSIS — L299 Pruritus, unspecified: Secondary | ICD-10-CM

## 2015-11-20 MED ORDER — IVERMECTIN 0.5 % EX LOTN: TOPICAL_LOTION | CUTANEOUS | 0 refills | Status: DC

## 2015-11-20 NOTE — Progress Notes (Signed)
   Patient: Diamond Lucas Female    DOB: Aug 22, 1960   55 y.o.   MRN: [id number] Visit Date: 11/20/2015  Today's Provider: Mar Daring, PA-C   No chief complaint on file.  Subjective:    HPI Patient is here because she feels like she has "something" is biting her arms, under finger and toe nails, legs and scalp.  Patient believes she has lice in her house and car. Patient reports trying RID and Nix with no relief. Triamcinolone cream was prescribed on 11/19/2015. Patient reports medication is not helping. Patient is requesting a RX for [employer]. States she used this back in 2001 without incident and this cleared up her lice.    Previous Medications   CALCIUM CARBONATE-VIT D-MIN (CALCIUM 600+D PLUS MINERALS) 600-400 MG-UNIT TABS    Take by mouth.   CYCLOSPORINE (RESTASIS) 0.05 % OPHTHALMIC EMULSION    Administer 1 drop to both eyes every twelve (12) hours.   LEVOTHYROXINE (LEVOXYL) 75 MCG TABLET    Take by mouth.   MOMETASONE (NASONEX) 50 MCG/ACT NASAL SPRAY    1 -2 spray in each nostril daily   MULTIPLE VITAMIN (MULTI-VITAMINS) TABS       RISPERIDONE (RISPERDAL) 2 MG TABLET    Take 1 tablet (2 mg total) by mouth at bedtime.   TRIAMCINOLONE CREAM (KENALOG) 0.1 %    Apply 1 application topically 2 (two) times daily.    Review of Systems  Constitutional: Negative.   Respiratory: Negative.   Cardiovascular: Negative.   Gastrointestinal: Negative.   Skin: Negative.        Itchy scalp  Neurological: Negative.     Social History  Substance Use Topics  . Smoking status: Never Smoker  . Smokeless tobacco: Never Used  . Alcohol use No   Objective:   LMP 03/29/2015   Physical Exam  Constitutional: She appears well-developed and well-nourished. No distress.  Cardiovascular: Normal rate, regular rhythm and normal heart sounds.  Exam reveals no gallop and no friction rub.   No murmur heard. Pulmonary/Chest: Effort normal and breath sounds normal. No respiratory distress. She has  no wheezes. She has no rales.  Skin: Skin is warm, dry and intact. No abrasion, no lesion and no rash noted. She is not diaphoretic. No cyanosis or erythema. Nails show no clubbing.  Checked hands and feet for possible scabies. Checked head/scalp/hairlines for lice. Nothing found.  Vitals reviewed.      Assessment & Plan:     1. Itchy scalp Discussed with patient that I did not see any bugs, bites or lesions. She even brought in an empty envelope that she stated she had put some lice in there to show me, but didn't think they were visible today. I advised that without having any issue it will not do any good, and could possibly cause side effects/adverse reaction. She was adamant still with being given SKLice lotion. Advised her to not use unless she sees a bug. She agreed, but with delusions she will probably still use anyways.  - Ivermectin 0.5 % LOTN; Use on dry hair once  Dispense: 117 g; Refill: 0   Follow up: No Follow-up on file.

## 2015-11-20 NOTE — Patient Instructions (Signed)
Ivermectin topical lotion What is this medicine? IVERMECTIN (eye ver MEK tin) is an anti-infective. It is used to treat head lice infestations. This medicine may be used for other purposes; ask your health care provider or pharmacist if you have questions. What should I tell my health care provider before I take this medicine? They need to know if you have any of these conditions: -an unusual or allergic reaction to ivermectin, other medicines, foods, dyes, or preservatives -pregnant or trying to get pregnant -breast-feeding How should I use this medicine? This medicine is for external use only. Do not take by mouth. Follow the directions on the package label. Apply to dry hair. Keep this medicine away from your eyes, mouth, nose, and vaginal opening. Apply to affected area until all the hair is thoroughly wet with product. Keep this medicine on your hair or affected area for 10 minutes. Then, rinse thoroughly with water. Dry with a clean towel. Wash hands after using. Use a nit comb to remove any of the remaining lice or nits. Do not repeat treatment unless instructed by your doctor or health care professional. Talk to your pediatrician regarding the use of this medicine in children. While this drug may be prescribed for children as young as 6 months for selected conditions, precautions do apply. Overdosage: If you think you have taken too much of this medicine contact a poison control center or emergency room at once. NOTE: This medicine is only for you. Do not share this medicine with others. What if I miss a dose? This does not apply; this medicine is not for regular use. What may interact with this medicine? Interactions are not expected. Do not use any other skin products on the affected area without telling your doctor or health care professional. This list may not describe all possible interactions. Give your health care provider a list of all the medicines, herbs, non-prescription drugs,  or dietary supplements you use. Also tell them if you smoke, drink alcohol, or use illegal drugs. Some items may interact with your medicine. What should I watch for while using this medicine? This medicine is used as a single application treatment. If live lice are observed after initial application, talk to your doctor or healthcare professional. Head lice can be spread from one person to another by direct contact with clothing, hats, scarves, bedding, towels, washcloths, hairbrushes, and combs. All members of your household should be examined for head lice and should receive treatment if they are found to be infected. If you have any questions about this, check with your doctor or health care professional. To prevent reinfection or spreading of the infection, the following steps should be taken: Machine wash all clothing, bedding, towels, and washcloths in very hot water and dry them using the hot cycle of a dryer for at least 20 minutes. Clothing or bedding that cannot be washed should be dry cleaned or sealed in an airtight plastic bag for 2 weeks. Shampoo any wigs or hairpieces. You should also wash all hairbrushes and combs in very hot soapy water (above 130 degrees F) for 5 to 10 minutes. Do not share your hairbrushes or combs with other people. Wash all toys in very hot water (above 130 degrees F) for 5 to 10 minutes or seal in an airtight plastic bag for 2 weeks. Also, clean the house or room by vacuuming furniture, rugs, and floors. What side effects may I notice from receiving this medicine? Side effects that you should report to your  doctor or health care professional as soon as possible: -allergic reactions like skin rash, itching or hives, swelling of the face, lips, or tongue Side effects that usually do not require medical attention (Report these to your doctor or health care professional if they continue or are bothersome.): -dandruff -dry skin -eye irritation -skin irritation This  list may not describe all possible side effects. Call your doctor for medical advice about side effects. You may report side effects to FDA at 1-800-FDA-1088. Where should I keep my medicine? Keep out of the reach of children. Store at room temperature between 20 and 25 degrees C (68 and 77 degrees F). Do not freeze. Throw away any unused medicine after the expiration date. NOTE: This sheet is a summary. It may not cover all possible information. If you have questions about this medicine, talk to your doctor, pharmacist, or health care provider.    2016, Elsevier/Gold Standard. (2013-05-17 13:56:19)

## 2015-11-23 ENCOUNTER — Ambulatory Visit: Payer: Medicare Other | Admitting: Psychiatry

## 2015-12-11 ENCOUNTER — Encounter: Payer: Self-pay | Admitting: Family Medicine

## 2015-12-11 ENCOUNTER — Ambulatory Visit (INDEPENDENT_AMBULATORY_CARE_PROVIDER_SITE_OTHER): Payer: Medicare Other | Admitting: Family Medicine

## 2015-12-11 VITALS — BP 108/78 | HR 74 | Temp 97.8°F | Resp 14 | Wt 106.0 lb

## 2015-12-11 DIAGNOSIS — H02833 Dermatochalasis of right eye, unspecified eyelid: Secondary | ICD-10-CM

## 2015-12-11 DIAGNOSIS — H9203 Otalgia, bilateral: Secondary | ICD-10-CM | POA: Diagnosis not present

## 2015-12-11 DIAGNOSIS — H0233 Blepharochalasis right eye, unspecified eyelid: Secondary | ICD-10-CM

## 2015-12-11 NOTE — Progress Notes (Signed)
Patient: Diamond Lucas Female    DOB: [birth year]   55 y.o.   MRN: [id number] Visit Date: 12/11/2015  Today's Provider: Vernie Murders, PA   Chief Complaint  Patient presents with  . Ear Pain   Subjective:    HPI Patient is here today requesting both ears be checked and cleaned due to insects being in her ears. Patient states her apartment is full of insects, and they are getting in her ears. Patient said she believes it is lice in her ears now. Patient reports pain level is 7/10. Patient also states her right upper eye lid is red and puffy since May. Patient reports using a product at night, and possible reaction to the product.   Past Medical History:  Diagnosis Date  . Allergy   . Arrhythmia   . History of chicken pox   . History of measles   . History of syncope   . Osteoarthritis   . Rosacea   . Schizophrenia ([place])   . Thyroid disease    Past Surgical History:  Procedure Laterality Date  . FOOT SURGERY Right   . THYROIDECTOMY     Family History  Problem Relation Age of Onset  . Depression Mother   . Cataracts Mother   . Cancer Other   . Heart failure Other   . Heart disease Other   . Stroke Other   . Diabetes Other   . Kidney cancer Father   . Anxiety disorder Brother    Allergies  Allergen Reactions  . Diclofenac Sodium Palpitations  . Naproxen Itching, Rash and Hives  . Dust Mite Mixed Allergen Ext [Mite (D. Farinae)] Itching    Respiratory illness, and itchy eyes, eye problems  . Nsaids     Other reaction(s): Other (See Comments)  . Other Itching    Respiratory illness, and itchy eyes, eye problems Respiratory illness, and itchy eyes, eye problems     Previous Medications   CALCIUM CARBONATE-VIT D-MIN (CALCIUM 600+D PLUS MINERALS) 600-400 MG-UNIT TABS    Take by mouth.   CYCLOSPORINE (RESTASIS) 0.05 % OPHTHALMIC EMULSION    Administer 1 drop to both eyes every twelve (12) hours.   IVERMECTIN 0.5 % LOTN    Use on dry hair once   LEVOTHYROXINE  (LEVOXYL) 75 MCG TABLET    Take by mouth.   MOMETASONE (NASONEX) 50 MCG/ACT NASAL SPRAY    1 -2 spray in each nostril daily   MULTIPLE VITAMIN (MULTI-VITAMINS) TABS       RISPERIDONE (RISPERDAL) 2 MG TABLET    Take 1 tablet (2 mg total) by mouth at bedtime.   TRIAMCINOLONE CREAM (KENALOG) 0.1 %    Apply 1 application topically 2 (two) times daily.    Review of Systems  Constitutional: Negative.   HENT: Positive for ear pain.   Respiratory: Negative.   Cardiovascular: Negative.     Social History  Substance Use Topics  . Smoking status: Never Smoker  . Smokeless tobacco: Never Used  . Alcohol use No   Objective:   BP 108/78 (BP Location: Right Arm, Patient Position: Sitting, Cuff Size: Normal)   Pulse 74   Temp 97.8 F (36.6 C) (Oral)   Resp 14   Wt 106 lb (48.1 kg)   LMP 03/29/2015   BMI 20.03 kg/m   Physical Exam  Constitutional: She appears well-nourished.  HENT:  Head: Normocephalic.  Right Ear: External ear normal.  Nose: Nose normal.  Mouth/Throat: Oropharynx is clear and moist.  Eyes:  Conjunctivae and EOM are normal.  Redundant drooping tissue of upper eyelids (R>L).  Neck: Neck supple.  Cardiovascular: Normal rate and regular rhythm.   Pulmonary/Chest: Effort normal and breath sounds normal.  Lymphadenopathy:    She has no cervical adenopathy.      Assessment & Plan:     1. Otalgia, bilateral Felt she had some itching and "insects" in ears. Has cleaned them and exam normal today. No sign of infection or irritation. No wax build up. Has a history of delusional symptoms with noncompliance with medications for schizophrenia. Should follow up with psychiatrist regularly.  2. Eyelid excess skin, right Redundant tissue of the upper eyelid is resting on eyelashes and possibly beginning to impinge on visual field. Recommend she follow up with her ophthalmologist.   Follow up: No Follow-up on file.

## 2015-12-15 DIAGNOSIS — F4323 Adjustment disorder with mixed anxiety and depressed mood: Secondary | ICD-10-CM | POA: Diagnosis not present

## 2015-12-22 DIAGNOSIS — F4323 Adjustment disorder with mixed anxiety and depressed mood: Secondary | ICD-10-CM | POA: Diagnosis not present

## 2015-12-29 DIAGNOSIS — F4323 Adjustment disorder with mixed anxiety and depressed mood: Secondary | ICD-10-CM | POA: Diagnosis not present

## 2015-12-31 ENCOUNTER — Ambulatory Visit (INDEPENDENT_AMBULATORY_CARE_PROVIDER_SITE_OTHER): Payer: Medicare Other | Admitting: Psychiatry

## 2015-12-31 ENCOUNTER — Ambulatory Visit (INDEPENDENT_AMBULATORY_CARE_PROVIDER_SITE_OTHER): Payer: Medicare Other | Admitting: Physician Assistant

## 2015-12-31 ENCOUNTER — Encounter: Payer: Self-pay | Admitting: Psychiatry

## 2015-12-31 ENCOUNTER — Encounter: Payer: Self-pay | Admitting: Physician Assistant

## 2015-12-31 VITALS — BP 110/60 | HR 65 | Temp 97.6°F | Resp 16 | Wt 106.2 lb

## 2015-12-31 VITALS — BP 145/82 | HR 66 | Wt 106.6 lb

## 2015-12-31 DIAGNOSIS — M26629 Arthralgia of temporomandibular joint, unspecified side: Secondary | ICD-10-CM

## 2015-12-31 DIAGNOSIS — M26622 Arthralgia of left temporomandibular joint: Secondary | ICD-10-CM

## 2015-12-31 DIAGNOSIS — F200 Paranoid schizophrenia: Secondary | ICD-10-CM | POA: Diagnosis not present

## 2015-12-31 MED ORDER — RISPERIDONE 2 MG PO TABS: 2.0000 mg | ORAL_TABLET | Freq: Every day | ORAL | 2 refills | Status: DC

## 2015-12-31 MED ORDER — IBUPROFEN 600 MG PO TABS: 600.0000 mg | ORAL_TABLET | Freq: Three times a day (TID) | ORAL | 0 refills | Status: DC | PRN

## 2015-12-31 MED ORDER — CYCLOBENZAPRINE HCL 5 MG PO TABS: 5.0000 mg | ORAL_TABLET | Freq: Every day | ORAL | 0 refills | Status: DC

## 2015-12-31 NOTE — Progress Notes (Signed)
Patient ID: Diamond Lucas, female   DOB: Mar 20, 1961, 55 y.o.   MRN: [id number]   Psychiatric Progress Note  Patient Identification: Diamond Lucas MRN:  [id number] Date of Evaluation:  12/31/2015 Chief Complaint:  Doing okay Chief Complaint    Follow-up; Medication Refill     Visit Diagnosis:    ICD-9-CM ICD-10-CM   1. Paranoid schizophrenia ([place]) 295.30 F20.0    Diagnosis:   Patient Active Problem List   Diagnosis Date Noted  . 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 HTN (hypertension) [I10] 10/07/2015  . Noncompliance [Z91.19] 10/06/2015  . Personal history of malignant neoplasm of thyroid [Z85.850] 02/09/2015  . Rosacea [L71.9] 02/09/2015  . Rhinitis, allergic [J30.9] 12/26/2014  . 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 H/O thyroidectomy [E89.0] 05/23/2014  . 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 Reflux [K21.9] 09/26/2012   History of Present Illness:   Patient is a 55 year old Caucasian woman with a previous diagnosis of paranoid schizophrenia  comes in today for a follow-up visit. Patient was most recently hospitalized at the psychiatric unit here at [facility]. She reports that she went to the emergency room saying she had a back in her stomach. Patient states that she continues to believe she has a back in her stomach. Patient also talks about breaking her jaw in the hospital and needing compensation. She was started on Risperdal in the hospital and states she has not been taking it. Patient is tangential and talks about other things. She denies any mood symptoms. She denies any suicidal thoughts. She denies any auditory or visual hallucinations.  Past Medical History:  Past Medical History:  Diagnosis Date  . Allergy   . Arrhythmia   . History of chicken pox   . History of measles   . History of syncope   . Osteoarthritis   . Rosacea   . Schizophrenia ([place])   . Thyroid  disease     Past Surgical History:  Procedure Laterality Date  . FOOT SURGERY Right   . THYROIDECTOMY     Family History:  Family History  Problem Relation Age of Onset  . Depression Mother   . Cataracts Mother   . Cancer Other   . Heart failure Other   . Heart disease Other   . Stroke Other   . Diabetes Other   . Kidney cancer Father   . Anxiety disorder Brother    Social History:   Social History   Social History  . Marital status: Single    Spouse name: N/A  . Number of children: N/A  . Years of education: N/A   Social History Main Topics  . Smoking status: Never Smoker  . Smokeless tobacco: Never Used  . Alcohol use No  . Drug use: No  . Sexual activity: Not Currently    Birth control/ protection: Post-menopausal   Other Topics Concern  . None   Social History Narrative  . None   Additional Social History:   Musculoskeletal: Strength & Muscle Tone: within normal limits Gait & Station: normal Patient leans: N/A  Psychiatric Specialty Exam: HPI  ROS  Blood pressure (!) 145/82, pulse 66, weight 106 lb 9.6 oz (48.4 kg), last menstrual period 03/29/2015.Body mass index is 20.14 kg/m.  General Appearance: Casual  Eye Contact:  Fair  Speech:  Clear and Coherent  Volume:  Normal  Mood:  brighter  Affect:  Congruent  Thought Process:  Circumstantial  Orientation:  Full (Time,  Place, and Person)  Thought Content:  Delusions, Ilusions and Paranoid Ideation  Suicidal Thoughts:  No  Homicidal Thoughts:  No  Memory:  Immediate;   Fair Recent;   Fair Remote;   Fair  Judgement:  okay  Insight:  Lacking  Psychomotor Activity:  Normal  Concentration:  Fair  Recall:  [employer] of Knowledge:Fair  Language: Fair  Akathisia:  No  Handed:  Right  AIMS (if indicated):  na  Assets:  Communication Skills Desire for Improvement Housing  ADL's:  Intact  Cognition: WNL  Sleep:  fair   Is the patient at risk to self?  No. Has the patient been a risk to  self in the past 6 months?  No. Has the patient been a risk to self within the distant past?  No. Is the patient a risk to others?  No. Has the patient been a risk to others in the past 6 months?  No. Has the patient been a risk to others within the distant past?  No.  Allergies:   Allergies  Allergen Reactions  . Diclofenac Sodium Palpitations  . Naproxen Itching, Rash and Hives  . Dust Mite Mixed Allergen Ext [Mite (D. Farinae)] Itching    Respiratory illness, and itchy eyes, eye problems  . Nsaids     Other reaction(s): Other (See Comments)  . Other Itching    Respiratory illness, and itchy eyes, eye problems Respiratory illness, and itchy eyes, eye problems   Current Medications: Current Outpatient Prescriptions  Medication Sig Dispense Refill  . Calcium Carbonate-Vit D-Min (CALCIUM 600+D PLUS MINERALS) 600-400 MG-UNIT TABS Take by mouth.    . cycloSPORINE (RESTASIS) 0.05 % ophthalmic emulsion Administer 1 drop to both eyes every twelve (12) hours.    . Ivermectin 0.5 % LOTN Use on dry hair once 117 g 0  . levothyroxine (LEVOXYL) 75 MCG tablet Take by mouth.    . mometasone (NASONEX) 50 MCG/ACT nasal spray 1 -2 spray in each nostril daily    . Multiple Vitamin (MULTI-VITAMINS) TABS     . risperiDONE (RISPERDAL) 2 MG tablet Take 1 tablet (2 mg total) by mouth at bedtime. 30 tablet 0  . triamcinolone cream (KENALOG) 0.1 % Apply 1 application topically 2 (two) times daily. 30 g 0   No current facility-administered medications for this visit.     Previous Psychotropic Medications: Yes   Substance Abuse History in the last 12 months:  No.  Consequences of Substance Abuse: Negative  Medical Decision Making:  Established Problem, Stable/Improving (1), Review of Psycho-Social Stressors (1), Review and summation of old records (2), Review of Medication Regimen & Side Effects (2) and Review of New Medication or Change in Dosage (2)  Treatment Plan Summary: Medication management    Delusional disorder/Schizophrenia Patient encouraged to take the Risperdal at 2 mg at bedtime. She was educated about the importance of taking her medication to help her sleep as well. However patient has a fixed delusion at this time and is not interested in therapy to. She is functioning well and able to manage with her ADL`s.   RTC clinic in 3 month's time or call before if necessary    Demetrias Goodbar 10/5/20179:27 AM

## 2015-12-31 NOTE — Patient Instructions (Signed)

## 2015-12-31 NOTE — Progress Notes (Signed)
Patient: Diamond Lucas Female    DOB: [birth year]   55 y.o.   MRN: [id number] Visit Date: 12/31/2015  Today's Provider: Mar Daring, PA-C   Chief Complaint  Patient presents with  . Pain left side of face   Subjective:    HPI Patient is here to day with c/o of left side of her face hurting. She reports that it is "muscle spasm" and she wants a prescription for Flexeril. Symptoms started 3-4 days ago. Reports muscle spasm over zygomatic arch, feels like pork chop bone in ear (left). Reports some pain with chewing and eating. Can eat steak but has to cut into tiny pieces. Popping and clicking. Not everyday. Extra strength tylenol and motrin occasionally have helped.     Allergies  Allergen Reactions  . Diclofenac Sodium Palpitations  . Naproxen Itching, Rash and Hives  . Dust Mite Mixed Allergen Ext [Mite (D. Farinae)] Itching    Respiratory illness, and itchy eyes, eye problems  . Nsaids     Other reaction(s): Other (See Comments)  . Other Itching    Respiratory illness, and itchy eyes, eye problems Respiratory illness, and itchy eyes, eye problems     Current Outpatient Prescriptions:  .  Calcium Carbonate-Vit D-Min (CALCIUM 600+D PLUS MINERALS) 600-400 MG-UNIT TABS, Take by mouth., Disp: , Rfl:  .  cycloSPORINE (RESTASIS) 0.05 % ophthalmic emulsion, Administer 1 drop to both eyes every twelve (12) hours., Disp: , Rfl:  .  levothyroxine (LEVOXYL) 75 MCG tablet, Take by mouth., Disp: , Rfl:  .  mometasone (NASONEX) 50 MCG/ACT nasal spray, 1 -2 spray in each nostril daily, Disp: , Rfl:  .  Multiple Vitamin (MULTI-VITAMINS) TABS, , Disp: , Rfl:  .  triamcinolone cream (KENALOG) 0.1 %, Apply 1 application topically 2 (two) times daily., Disp: 30 g, Rfl: 0 .  Ivermectin 0.5 % LOTN, Use on dry hair once (Patient not taking: Reported on 12/31/2015), Disp: 117 g, Rfl: 0 .  risperiDONE (RISPERDAL) 2 MG tablet, Take 1 tablet (2 mg total) by mouth at bedtime. (Patient  not taking: Reported on 12/31/2015), Disp: 30 tablet, Rfl: 2  Review of Systems  Constitutional: Negative.   HENT: Negative.   Respiratory: Negative.   Cardiovascular: Negative.   Neurological: Negative.     Social History  Substance Use Topics  . Smoking status: Never Smoker  . Smokeless tobacco: Never Used  . Alcohol use No   Objective:   BP 110/60 (BP Location: Right Arm, Patient Position: Sitting, Cuff Size: Normal)   Pulse 65   Temp 97.6 F (36.4 C) (Oral)   Resp 16   Wt 106 lb 3.2 oz (48.2 kg)   LMP 03/29/2015   BMI 20.07 kg/m   Physical Exam  Constitutional: She appears well-developed and well-nourished. No distress.  HENT:  Head: Normocephalic and atraumatic.  Right Ear: Hearing, tympanic membrane, external ear and ear canal normal.  Left Ear: Hearing, tympanic membrane, external ear and ear canal normal.  Nose: Nose normal.  Mouth/Throat: Uvula is midline, oropharynx is clear and moist and mucous membranes are normal. No oropharyngeal exudate.  Tenderness to palpation over the left temporomandibular joint. Popping noted during opening of jaw with palpation  Eyes: Conjunctivae and EOM are normal. Pupils are equal, round, and reactive to light. Right eye exhibits no discharge. Left eye exhibits no discharge.  Neck: Normal range of motion. Neck supple. No JVD present. No tracheal deviation present. No Brudzinski's sign and no Kernig's  sign noted. No thyromegaly present.  Cardiovascular: Normal rate, regular rhythm and normal heart sounds.  Exam reveals no gallop and no friction rub.   No murmur heard. Pulmonary/Chest: Effort normal and breath sounds normal. No stridor. No respiratory distress. She has no wheezes. She has no rales. She exhibits no tenderness.  Lymphadenopathy:    She has no cervical adenopathy.  Skin: Skin is warm and dry.  Vitals reviewed.     Assessment & Plan:     1. Arthralgia of left temporomandibular joint She has been noted to have TMJ  issues in the past. I did discuss with her that if this continues to worsen she needs to follow-up with her dentist. She voiced understanding. I will give her ibuprofen 600 mg and Flexeril as below for pain and discomfort. She is to call the office if symptoms do not improve. - ibuprofen (ADVIL,MOTRIN) 600 MG tablet; Take 1 tablet (600 mg total) by mouth every 8 (eight) hours as needed.  Dispense: 30 tablet; Refill: 0 - cyclobenzaprine (FLEXERIL) 5 MG tablet; Take 1 tablet (5 mg total) by mouth at bedtime.  Dispense: 30 tablet; Refill: 0  2. Temporomandibular joint-pain-dysfunction syndrome See above medical treatment plan. - ibuprofen (ADVIL,MOTRIN) 600 MG tablet; Take 1 tablet (600 mg total) by mouth every 8 (eight) hours as needed.  Dispense: 30 tablet; Refill: 0 - cyclobenzaprine (FLEXERIL) 5 MG tablet; Take 1 tablet (5 mg total) by mouth at bedtime.  Dispense: 30 tablet; Refill: 0       Mar Daring, PA-C  [facility]

## 2016-01-05 ENCOUNTER — Encounter: Payer: Self-pay | Admitting: Obstetrics and Gynecology

## 2016-01-05 ENCOUNTER — Ambulatory Visit (INDEPENDENT_AMBULATORY_CARE_PROVIDER_SITE_OTHER): Payer: Medicare Other | Admitting: Obstetrics and Gynecology

## 2016-01-05 VITALS — BP 134/78 | HR 69 | Ht 62.0 in | Wt 105.8 lb

## 2016-01-05 DIAGNOSIS — F22 Delusional disorders: Secondary | ICD-10-CM

## 2016-01-05 DIAGNOSIS — N898 Other specified noninflammatory disorders of vagina: Secondary | ICD-10-CM

## 2016-01-05 NOTE — Progress Notes (Signed)
    GYNECOLOGY PROGRESS NOTE  Subjective:    Patient ID: Diamond Lucas, female    DOB: September 19, 1960, 55 y.o.   MRN: [id number]  HPI  Patient is a 55 y.o. [id number] female who presents for complaints of vaginal odor x 2 days.  Patient reports that yesterday she began noting that the "bats in her uterus" were telling her that something smells funny".  Patient notes that she soon began noting an odor from the vagina that smelled like "burned plastic".  Reports that she she used a mixture of T-tree oil and water to cleanse the vagina which has eliminated the smell.  Just wanted to be checked out to make sure that there was no infection.   The following portions of the patient's history were reviewed and updated as appropriate: allergies, current medications, past family history, past medical history, past social history, past surgical history and problem list.  Review of Systems Pertinent items noted in HPI and remainder of comprehensive ROS otherwise negative.   Objective:   Blood pressure 134/78, pulse 69, height 5\' 2"  (1.575 m), weight 105 lb 12.8 oz (48 kg), last menstrual period 03/29/2015. General appearance: alert and no distress Abdomen: soft, non-tender; bowel sounds normal; no masses,  no organomegaly Pelvic: external genitalia normal.  Vagina with scant thin white discharge, no odor, mildly atrophic.  Cervix normal appearing, pinpoint stenotic os. No CMT.  Uterus normal shape, size, mobile, nontender.  Adnexae non-palpable, non-tender.  Extremities: extremities normal, atraumatic, no cyanosis or edema   Microscopic wet-mount exam shows negative for pathogens, normal epithelial cells, KOH done.  Assessment:   Vaginal odor Delusional disorder  Plan:   - Patient initially with vaginal odor, now notes resolution after home remedy treatment. Wet prep negative today.  Given reassurance. - Patient still desires to know how to get the "bats out of her uterus as they should be due by  now".  Discussed again with patient that all testing and imaging in the past has shown no evidence of bats.  Several "interventions" have been tried in the past without success.  Patient notes that she will give the bats several more days to pass on their own.     [name], MD [facility]

## 2016-01-12 DIAGNOSIS — F4323 Adjustment disorder with mixed anxiety and depressed mood: Secondary | ICD-10-CM | POA: Diagnosis not present

## 2016-01-19 DIAGNOSIS — F4323 Adjustment disorder with mixed anxiety and depressed mood: Secondary | ICD-10-CM | POA: Diagnosis not present

## 2016-01-21 ENCOUNTER — Telehealth: Payer: Self-pay | Admitting: Obstetrics and Gynecology

## 2016-01-21 NOTE — Telephone Encounter (Signed)
Patient called stating she is having complications with her uterus. She states she has one bat possibly more in her uterus and has been exposed to tapeworm due to her apartment building being infested with bats. 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

## 2016-01-25 ENCOUNTER — Telehealth: Payer: Self-pay | Admitting: Obstetrics and Gynecology

## 2016-01-25 NOTE — Telephone Encounter (Signed)
Diamond Lucas thinks she has a tape worm in her stomach. She said she wants an antidote for this. She said there is capsule form of medication to get rid of her tape worm.

## 2016-01-25 NOTE — Telephone Encounter (Signed)
Please inform patient that I am pretty sure regarding [place] not having an outdoor birthing center.  If she is interested in a birthing center, then nearest places would be [place] or [place].

## 2016-01-26 NOTE — Telephone Encounter (Signed)
Please inform pt that we do not treat tape worms, will need to see PCP

## 2016-01-27 ENCOUNTER — Telehealth: Payer: Self-pay | Admitting: Obstetrics and Gynecology

## 2016-01-27 NOTE — Telephone Encounter (Signed)
Patient called requesting an ultrasound to see if she does in fact have a tape worm. Thanks

## 2016-01-28 NOTE — Telephone Encounter (Signed)
We do not treat tapeworms, pt will need to see PCP or ED. Tapeworms are also not detectable on u/s. Thanks

## 2016-02-02 DIAGNOSIS — F4323 Adjustment disorder with mixed anxiety and depressed mood: Secondary | ICD-10-CM | POA: Diagnosis not present

## 2016-02-04 ENCOUNTER — Telehealth: Payer: Self-pay | Admitting: Obstetrics and Gynecology

## 2016-02-04 NOTE — Telephone Encounter (Signed)
PT CALLED AND LEFT OFFICE MESSAGE STATING SHE WOULD LIKE A [place] FOR TOMORROW IF POSSIBLE, PLEASE LET ME KNOW IF I NEED TO SCHEDULE THIS FOR HER.

## 2016-02-04 NOTE — Telephone Encounter (Signed)
No at this time the pt does not need an u/s. thanks

## 2016-02-05 ENCOUNTER — Telehealth: Payer: Self-pay | Admitting: Obstetrics and Gynecology

## 2016-02-05 NOTE — Telephone Encounter (Signed)
PT CALLED AND WANTED TO HAVE AN US DONE DUE TO SHE STATED SHE HAS A TAPE WORM IN HER UTERUS, AND PER OKI I TOLD HER THAT AT THIS TIME WE ARE UNABLE TO DO AN [place]. PLUS AN [place] WOULD NOT SHOW THAT, SHE WANTED ME TO ASK YOU IF YOU COULD ORDER A TEST THAT WOULD SHOW THE TAPE WORM.

## 2016-02-09 DIAGNOSIS — F4323 Adjustment disorder with mixed anxiety and depressed mood: Secondary | ICD-10-CM | POA: Diagnosis not present

## 2016-02-09 NOTE — Telephone Encounter (Signed)
There is no test in Gynecology that could show a tapeworm.  If she feels that she has one, she should contact her PCP.

## 2016-02-11 ENCOUNTER — Telehealth: Payer: Self-pay | Admitting: Physician Assistant

## 2016-02-11 ENCOUNTER — Encounter: Payer: Self-pay | Admitting: Physician Assistant

## 2016-02-11 ENCOUNTER — Ambulatory Visit (INDEPENDENT_AMBULATORY_CARE_PROVIDER_SITE_OTHER): Payer: Medicare Other | Admitting: Physician Assistant

## 2016-02-11 VITALS — BP 108/60 | HR 81 | Temp 97.8°F | Resp 16 | Wt 109.0 lb

## 2016-02-11 DIAGNOSIS — F22 Delusional disorders: Secondary | ICD-10-CM

## 2016-02-11 NOTE — Progress Notes (Signed)
Patient: Diamond Lucas Female    DOB: 10-11-1960   55 y.o.   MRN: [id number] Visit Date: 02/11/2016  Today's Provider: Mar Daring, PA-C   Chief Complaint  Patient presents with  . Taper Worms   Subjective:    HPI Patient is here today with c/o of getting a tapeworm in her uterus from going to the outlets in [place]. She reports that in one of the stores there was a "case of tape worm" and that she saw some in her laundry area after that trip. She doesn't have any symptoms in her rectum or mouth. She denies diarrhea, rectal itching, nausea, vomiting, weight loss. She feels some abdominal distention stating it is possibly a tape worm. She feels the tape worms are in her uterus. She wants to know if it is possible for her cervix to get dialated and have medication placed inside her uterus to get the worms out.    Allergies  Allergen Reactions  . Diclofenac Sodium Palpitations  . Naproxen Itching, Rash and Hives  . Dust Mite Mixed Allergen Ext [Mite (D. Farinae)] Itching    Respiratory illness, and itchy eyes, eye problems  . Nsaids     Other reaction(s): Other (See Comments)  . Other Itching    Respiratory illness, and itchy eyes, eye problems Respiratory illness, and itchy eyes, eye problems     Current Outpatient Prescriptions:  .  Calcium Carbonate-Vit D-Min (CALCIUM 600+D PLUS MINERALS) 600-400 MG-UNIT TABS, Take by mouth., Disp: , Rfl:  .  cyclobenzaprine (FLEXERIL) 5 MG tablet, Take 1 tablet (5 mg total) by mouth at bedtime., Disp: 30 tablet, Rfl: 0 .  cycloSPORINE (RESTASIS) 0.05 % ophthalmic emulsion, Administer 1 drop to both eyes every twelve (12) hours., Disp: , Rfl:  .  ibuprofen (ADVIL,MOTRIN) 600 MG tablet, Take 1 tablet (600 mg total) by mouth every 8 (eight) hours as needed., Disp: 30 tablet, Rfl: 0 .  levothyroxine (LEVOXYL) 75 MCG tablet, Take by mouth., Disp: , Rfl:  .  Multiple Vitamin (MULTI-VITAMINS) TABS, , Disp: , Rfl:  .  risperiDONE  (RISPERDAL) 2 MG tablet, Take 1 tablet (2 mg total) by mouth at bedtime., Disp: 30 tablet, Rfl: 2 .  mometasone (NASONEX) 50 MCG/ACT nasal spray, 1 -2 spray in each nostril daily, Disp: , Rfl:  .  triamcinolone cream (KENALOG) 0.1 %, Apply 1 application topically 2 (two) times daily. (Patient not taking: Reported on 02/11/2016), Disp: 30 g, Rfl: 0  Review of Systems  Constitutional: Negative.   HENT: Ear pain: she feels somthing crawling deep inside her right ear.   Respiratory: Negative.   Cardiovascular: Negative for chest pain, palpitations and leg swelling.  Gastrointestinal: Positive for abdominal distention. Negative for abdominal pain, anal bleeding, blood in stool, constipation, diarrhea, nausea, rectal pain and vomiting.  Genitourinary: Negative.   Neurological: Negative.   Psychiatric/Behavioral: Positive for hallucinations.    Social History  Substance Use Topics  . Smoking status: Never Smoker  . Smokeless tobacco: Never Used  . Alcohol use No   Objective:   BP 108/60 (BP Location: Left Arm, Patient Position: Sitting, Cuff Size: Normal)   Pulse 81   Temp 97.8 F (36.6 C) (Oral)   Resp 16   Wt 109 lb (49.4 kg)   LMP 03/29/2015   BMI 19.94 kg/m   Physical Exam  Constitutional: She appears well-developed and well-nourished. No distress.  Neck: Normal range of motion. Neck supple.  Cardiovascular: Normal rate, regular  rhythm and normal heart sounds.  Exam reveals no gallop and no friction rub.   No murmur heard. Pulmonary/Chest: Effort normal and breath sounds normal. No respiratory distress. She has no wheezes. She has no rales.  Abdominal: Soft. Bowel sounds are normal. She exhibits no distension and no mass. There is generalized tenderness. There is no rebound and no guarding.  Skin: She is not diaphoretic.  Psychiatric: She has a normal mood and affect. Her speech is normal and behavior is normal. Thought content is delusional.  Vitals reviewed.        Assessment & Plan:     1. Delusional disorder, somatic type [facility]) Patient now with deletion of having a tapeworm in her uterus. Previously had been a bad. She has been seen by gynecology and had multiple workups with out any abnormality found. Discussed with patient that it is very unlikely for her to have a tapeworm in her uterus and that I did not feel was appropriate for her to have any medication being that she is not showing any other signs or symptoms. She voiced understanding and stated that she may call her gynecologist for this. I told her that she could call her gynecologist but that she will most likely told her the same thing being that she has already had procedures done without any findings or resolution of her symptoms. Patient still refuses to take medication. She is still followed by psych.       Mar Daring, PA-C  [facility]

## 2016-02-11 NOTE — Telephone Encounter (Signed)
Pt is stating she may have tapeworm in her uterus. Pt thinks she may have got this shopping at the outlets in [place]. Pt is asking if there is a Rx that will help with this.  [employer].  [phone]

## 2016-02-11 NOTE — Telephone Encounter (Signed)
Please advise if you want patient to come in office to discuss. KW

## 2016-02-11 NOTE — Telephone Encounter (Signed)
She would need to be seen to discuss

## 2016-02-11 NOTE — Telephone Encounter (Signed)
Appt scheduled this afternoon patient declined earlier appt. Diamond Lucas

## 2016-02-16 ENCOUNTER — Encounter: Payer: Self-pay | Admitting: Emergency Medicine

## 2016-02-16 ENCOUNTER — Emergency Department
Admission: EM | Admit: 2016-02-16 | Discharge: 2016-02-17 | Disposition: A | Discharge status: Home / Self Care | Payer: Medicare Other | Attending: Emergency Medicine | Admitting: Emergency Medicine

## 2016-02-16 DIAGNOSIS — I10 Essential (primary) hypertension: Secondary | ICD-10-CM | POA: Diagnosis present

## 2016-02-16 DIAGNOSIS — Z9119 Patient's noncompliance with other medical treatment and regimen: Secondary | ICD-10-CM

## 2016-02-16 DIAGNOSIS — E039 Hypothyroidism, unspecified: Secondary | ICD-10-CM | POA: Insufficient documentation

## 2016-02-16 DIAGNOSIS — Z91199 Patient's noncompliance with other medical treatment and regimen due to unspecified reason: Secondary | ICD-10-CM

## 2016-02-16 DIAGNOSIS — E890 Postprocedural hypothyroidism: Secondary | ICD-10-CM | POA: Diagnosis present

## 2016-02-16 DIAGNOSIS — Z046 Encounter for general psychiatric examination, requested by authority: Secondary | ICD-10-CM | POA: Diagnosis present

## 2016-02-16 DIAGNOSIS — Z5181 Encounter for therapeutic drug level monitoring: Secondary | ICD-10-CM | POA: Diagnosis not present

## 2016-02-16 DIAGNOSIS — F29 Unspecified psychosis not due to a substance or known physiological condition: Secondary | ICD-10-CM | POA: Diagnosis not present

## 2016-02-16 DIAGNOSIS — F22 Delusional disorders: Secondary | ICD-10-CM | POA: Diagnosis present

## 2016-02-16 DIAGNOSIS — Z85850 Personal history of malignant neoplasm of thyroid: Secondary | ICD-10-CM | POA: Insufficient documentation

## 2016-02-16 LAB — COMPREHENSIVE METABOLIC PANEL
ALBUMIN: 4.4 g/dL (ref 3.5–5.0)
ALT: 19 U/L (ref 14–54)
ANION GAP: 8 (ref 5–15)
AST: 23 U/L (ref 15–41)
Alkaline Phosphatase: 66 U/L (ref 38–126)
BILIRUBIN TOTAL: 0.6 mg/dL (ref 0.3–1.2)
BUN: 16 mg/dL (ref 6–20)
CO2: 30 mmol/L (ref 22–32)
Calcium: 9 mg/dL (ref 8.9–10.3)
Chloride: 101 mmol/L (ref 101–111)
Creatinine, Ser: 0.76 mg/dL (ref 0.44–1.00)
GFR calc Af Amer: >60 mL/min (ref >60)
GFR calc non Af Amer: >60 mL/min (ref >60)
GLUCOSE: 100 mg/dL — AB (ref 65–99)
POTASSIUM: 4.1 mmol/L (ref 3.5–5.1)
SODIUM: 139 mmol/L (ref 135–145)
TOTAL PROTEIN: 7.7 g/dL (ref 6.5–8.1)

## 2016-02-16 LAB — URINALYSIS COMPLETE WITH MICROSCOPIC (ARMC ONLY)
Bacteria, UA: NONE SEEN
Bilirubin Urine: NEGATIVE
Glucose, UA: NEGATIVE mg/dL
Hgb urine dipstick: NEGATIVE
KETONES UR: NEGATIVE mg/dL
LEUKOCYTES UA: NEGATIVE
Nitrite: NEGATIVE
PH: 7 (ref 5.0–8.0)
PROTEIN: NEGATIVE mg/dL
Specific Gravity, Urine: 1.008 (ref 1.005–1.030)

## 2016-02-16 LAB — URINE DRUG SCREEN, QUALITATIVE (ARMC ONLY)
AMPHETAMINES, UR SCREEN: NOT DETECTED
BENZODIAZEPINE, UR SCRN: NOT DETECTED
Barbiturates, Ur Screen: NOT DETECTED
COCAINE METABOLITE, UR ~~LOC~~: NOT DETECTED
Cannabinoid 50 Ng, Ur ~~LOC~~: NOT DETECTED
MDMA (ECSTASY) UR SCREEN: NOT DETECTED
METHADONE SCREEN, URINE: NOT DETECTED
OPIATE, UR SCREEN: NOT DETECTED
PHENCYCLIDINE (PCP) UR S: NOT DETECTED
Tricyclic, Ur Screen: NOT DETECTED

## 2016-02-16 LAB — CBC
HEMATOCRIT: 44.4 % (ref 35.0–47.0)
HEMOGLOBIN: 15.2 g/dL (ref 12.0–16.0)
MCH: 31.6 pg (ref 26.0–34.0)
MCHC: 34.2 g/dL (ref 32.0–36.0)
MCV: 92.4 fL (ref 80.0–100.0)
Platelets: 258 10*3/uL (ref 150–440)
RBC: 4.81 MIL/uL (ref 3.80–5.20)
RDW: 13.1 % (ref 11.5–14.5)
WBC: 7.5 10*3/uL (ref 3.6–11.0)

## 2016-02-16 LAB — ACETAMINOPHEN LEVEL

## 2016-02-16 LAB — ETHANOL: Alcohol, Ethyl (B): <5 mg/dL (ref <5)

## 2016-02-16 LAB — TSH: TSH: 0.427 u[IU]/mL (ref 0.350–4.500)

## 2016-02-16 LAB — SALICYLATE LEVEL: Salicylate Lvl: <7 mg/dL (ref 2.8–30.0)

## 2016-02-16 MED ORDER — ADULT MULTIVITAMIN W/MINERALS CH
1.0000 | ORAL_TABLET | Freq: Every day | ORAL | Status: DC
  Administered 2016-02-17: 1 via ORAL
  Filled 2016-02-16: qty 1

## 2016-02-16 MED ORDER — MULTI-VITAMINS PO TABS: 1.0000 | ORAL_TABLET | Freq: Every morning | ORAL | Status: DC

## 2016-02-16 MED ORDER — LEVOTHYROXINE SODIUM 50 MCG PO TABS
75.0000 ug | ORAL_TABLET | Freq: Every day | ORAL | Status: DC
  Administered 2016-02-17: 75 ug via ORAL
  Filled 2016-02-16: qty 2

## 2016-02-16 MED ORDER — CALCIUM 600+D PLUS MINERALS 600-400 MG-UNIT PO TABS: 600.0000 mg | ORAL_TABLET | Freq: Two times a day (BID) | ORAL | Status: DC

## 2016-02-16 MED ORDER — CALCIUM CARBONATE-VITAMIN D 500-200 MG-UNIT PO TABS
1.0000 | ORAL_TABLET | Freq: Two times a day (BID) | ORAL | Status: DC
  Administered 2016-02-16 – 2016-02-17 (×2): 1 via ORAL
  Filled 2016-02-16 (×2): qty 1

## 2016-02-16 MED ORDER — CYCLOSPORINE 0.05 % OP EMUL
1.0000 [drp] | Freq: Two times a day (BID) | OPHTHALMIC | Status: DC
  Administered 2016-02-16 – 2016-02-17 (×2): 1 [drp] via OPHTHALMIC
  Filled 2016-02-16 (×2): qty 1

## 2016-02-16 MED ORDER — RISPERIDONE 1 MG PO TABS
2.0000 mg | ORAL_TABLET | Freq: Every day | ORAL | Status: DC
  Administered 2016-02-16: 2 mg via ORAL
  Filled 2016-02-16: qty 2

## 2016-02-16 MED ORDER — CYCLOBENZAPRINE HCL 10 MG PO TABS
5.0000 mg | ORAL_TABLET | Freq: Every day | ORAL | Status: DC
Start: 2016-02-16 — End: 2016-02-17
  Administered 2016-02-16: 5 mg via ORAL
  Filled 2016-02-16: qty 1

## 2016-02-16 NOTE — ED Notes (Signed)
Pt unsure why she is here. Stated that she called Teacher, early years/pre about the  parasitic worms in the walls. Pt denies seeing worms, but sees the humps in the wall they make. Pt awake, oriented, calm and cooperative at this time. Denies SI/HI. Denies pain.
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
## 2016-02-16 NOTE — ED Notes (Signed)
Gave pt a cup with 10cc of saline for her contacts.

## 2016-02-16 NOTE — ED Notes (Signed)
[place] computer setup in the room at this time.

## 2016-02-16 NOTE — ED Notes (Signed)
Pt. Noted in room. No complaints or concerns voiced. No distress or abnormal behavior noted. Will continue to monitor  Q 15 minute rounds continue.

## 2016-02-16 NOTE — ED Notes (Signed)
Pt still talking about worms in the walls of her apt. Pleasant and cooperative.

## 2016-02-16 NOTE — ED Notes (Signed)

## 2016-02-16 NOTE — ED Notes (Signed)
BEHAVIORAL HEALTH ROUNDING Patient sleeping: No Patient alert and oriented: Yes, patient is eating dinner. Behavior appropriate: Yes Describe behavior: No inappropriate or unacceptable behaviors noted at this time.  Nutrition and fluids offered: Yes Toileting and hygiene offered: Yes Sitter present: Behavioral tech rounding every 15 minutes on patient to ensure safety.  Law enforcement present: Yes Event organiser agency: [employer] ([employer])

## 2016-02-17 ENCOUNTER — Inpatient Hospital Stay
Admission: EM | Admit: 2016-02-17 | Discharge: 2016-02-25 | DRG: 885 | Disposition: A | Discharge status: Home / Self Care | Payer: Medicare Other | Source: Intra-hospital | Attending: Psychiatry | Admitting: Psychiatry

## 2016-02-17 ENCOUNTER — Encounter: Payer: Self-pay | Admitting: Psychiatry

## 2016-02-17 DIAGNOSIS — M62838 Other muscle spasm: Secondary | ICD-10-CM | POA: Diagnosis present

## 2016-02-17 DIAGNOSIS — F22 Delusional disorders: Secondary | ICD-10-CM | POA: Diagnosis present

## 2016-02-17 DIAGNOSIS — R9431 Abnormal electrocardiogram [ECG] [EKG]: Secondary | ICD-10-CM | POA: Diagnosis not present

## 2016-02-17 DIAGNOSIS — E039 Hypothyroidism, unspecified: Secondary | ICD-10-CM | POA: Diagnosis not present

## 2016-02-17 DIAGNOSIS — Z79899 Other long term (current) drug therapy: Secondary | ICD-10-CM

## 2016-02-17 DIAGNOSIS — E890 Postprocedural hypothyroidism: Secondary | ICD-10-CM | POA: Diagnosis present

## 2016-02-17 DIAGNOSIS — F29 Unspecified psychosis not due to a substance or known physiological condition: Secondary | ICD-10-CM | POA: Diagnosis not present

## 2016-02-17 DIAGNOSIS — I10 Essential (primary) hypertension: Secondary | ICD-10-CM | POA: Diagnosis not present

## 2016-02-17 DIAGNOSIS — Z85850 Personal history of malignant neoplasm of thyroid: Secondary | ICD-10-CM | POA: Diagnosis not present

## 2016-02-17 DIAGNOSIS — K5900 Constipation, unspecified: Secondary | ICD-10-CM | POA: Diagnosis present

## 2016-02-17 DIAGNOSIS — F312 Bipolar disorder, current episode manic severe with psychotic features: Secondary | ICD-10-CM | POA: Diagnosis not present

## 2016-02-17 DIAGNOSIS — Z5181 Encounter for therapeutic drug level monitoring: Secondary | ICD-10-CM | POA: Diagnosis not present

## 2016-02-17 DIAGNOSIS — Z91199 Patient's noncompliance with other medical treatment and regimen due to unspecified reason: Secondary | ICD-10-CM

## 2016-02-17 DIAGNOSIS — Z9119 Patient's noncompliance with other medical treatment and regimen: Secondary | ICD-10-CM

## 2016-02-17 MED ORDER — RISPERIDONE 1 MG PO TABS: 2.0000 mg | ORAL_TABLET | Freq: Every day | ORAL | Status: DC

## 2016-02-17 MED ORDER — DOCUSATE SODIUM 100 MG PO CAPS
100.0000 mg | ORAL_CAPSULE | Freq: Every day | ORAL | Status: DC
  Administered 2016-02-18 – 2016-02-25 (×8): 100 mg via ORAL
  Filled 2016-02-17 (×8): qty 1

## 2016-02-17 MED ORDER — CYCLOSPORINE 0.05 % OP EMUL: 1.0000 [drp] | Freq: Two times a day (BID) | OPHTHALMIC | Status: DC

## 2016-02-17 MED ORDER — FLUTICASONE PROPIONATE 50 MCG/ACT NA SUSP
1.0000 | Freq: Every day | NASAL | Status: DC
  Administered 2016-02-20 – 2016-02-24 (×5): 1 via NASAL
  Filled 2016-02-17: qty 16

## 2016-02-17 MED ORDER — CYCLOSPORINE 0.05 % OP EMUL
1.0000 [drp] | Freq: Two times a day (BID) | OPHTHALMIC | Status: DC
  Administered 2016-02-17 – 2016-02-25 (×16): 1 [drp] via OPHTHALMIC
  Filled 2016-02-17 (×15): qty 1

## 2016-02-17 MED ORDER — CALCIUM 600+D PLUS MINERALS 600-400 MG-UNIT PO TABS: 1.0000 | ORAL_TABLET | Freq: Every day | ORAL | Status: DC

## 2016-02-17 MED ORDER — ADULT MULTIVITAMIN W/MINERALS CH
1.0000 | ORAL_TABLET | Freq: Every day | ORAL | Status: DC
  Administered 2016-02-18 – 2016-02-25 (×8): 1 via ORAL
  Filled 2016-02-17 (×7): qty 1

## 2016-02-17 MED ORDER — METOPROLOL SUCCINATE ER 25 MG PO TB24
12.5000 mg | ORAL_TABLET | Freq: Every day | ORAL | Status: DC
  Administered 2016-02-20: 12.5 mg via ORAL
  Filled 2016-02-17 (×9): qty 1

## 2016-02-17 MED ORDER — LEVOTHYROXINE SODIUM 75 MCG PO TABS: 75.0000 ug | ORAL_TABLET | Freq: Every day | ORAL | Status: DC

## 2016-02-17 MED ORDER — MULTI-VITAMINS PO TABS: 1.0000 | ORAL_TABLET | Freq: Every day | ORAL | Status: DC

## 2016-02-17 MED ORDER — IBUPROFEN 600 MG PO TABS: 600.0000 mg | ORAL_TABLET | Freq: Four times a day (QID) | ORAL | Status: DC | PRN

## 2016-02-17 MED ORDER — LEVOTHYROXINE SODIUM 75 MCG PO TABS
75.0000 ug | ORAL_TABLET | Freq: Every day | ORAL | Status: DC
  Administered 2016-02-18 – 2016-02-25 (×8): 75 ug via ORAL
  Filled 2016-02-17 (×10): qty 1

## 2016-02-17 MED ORDER — DOCUSATE SODIUM 100 MG PO CAPS: 100.0000 mg | ORAL_CAPSULE | Freq: Once | ORAL | Status: DC

## 2016-02-17 MED ORDER — CALCIUM CARBONATE-VITAMIN D 500-200 MG-UNIT PO TABS
1.0000 | ORAL_TABLET | Freq: Two times a day (BID) | ORAL | Status: DC
  Administered 2016-02-17 – 2016-02-25 (×14): 1 via ORAL
  Filled 2016-02-17 (×17): qty 1

## 2016-02-17 MED ORDER — ALUM & MAG HYDROXIDE-SIMETH 200-200-20 MG/5ML PO SUSP: 30.0000 mL | ORAL | Status: DC | PRN

## 2016-02-17 MED ORDER — DOCUSATE SODIUM 100 MG PO CAPS
100.0000 mg | ORAL_CAPSULE | Freq: Every day | ORAL | Status: DC
  Administered 2016-02-17: 100 mg via ORAL
  Filled 2016-02-17: qty 1

## 2016-02-17 MED ORDER — ACETAMINOPHEN 325 MG PO TABS: 650.0000 mg | ORAL_TABLET | Freq: Four times a day (QID) | ORAL | Status: DC | PRN

## 2016-02-17 MED ORDER — CYCLOBENZAPRINE HCL 10 MG PO TABS
5.0000 mg | ORAL_TABLET | Freq: Every day | ORAL | Status: DC
  Filled 2016-02-17 (×7): qty 1

## 2016-02-17 MED ORDER — CYCLOBENZAPRINE HCL 10 MG PO TABS: 5.0000 mg | ORAL_TABLET | Freq: Every day | ORAL | Status: DC

## 2016-02-17 MED ORDER — TRIAMCINOLONE ACETONIDE 0.1 % EX CREA
1.0000 "application " | TOPICAL_CREAM | Freq: Two times a day (BID) | CUTANEOUS | Status: DC
  Filled 2016-02-17: qty 15

## 2016-02-17 MED ORDER — MAGNESIUM HYDROXIDE 400 MG/5ML PO SUSP: 30.0000 mL | Freq: Every day | ORAL | Status: DC | PRN

## 2016-02-17 MED ORDER — RISPERIDONE 1 MG PO TABS
2.0000 mg | ORAL_TABLET | Freq: Two times a day (BID) | ORAL | Status: DC
  Administered 2016-02-17: 2 mg via ORAL
  Administered 2016-02-18 – 2016-02-19 (×3): 1 mg via ORAL
  Administered 2016-02-20 (×2): 2 mg via ORAL
  Administered 2016-02-21 (×2): 1 mg via ORAL
  Administered 2016-02-22 (×2): 2 mg via ORAL
  Filled 2016-02-17 (×8): qty 2
  Filled 2016-02-17: qty 1
  Filled 2016-02-17 (×3): qty 2

## 2016-02-17 NOTE — ED Notes (Signed)
Pt. Noted in room. No complaints or concerns voiced. No distress or abnormal behavior noted. Will continue to monitor and Q 15 minute rounds continue. Pt requesting stool softner Dr. Dahlia Client made aware.

## 2016-02-17 NOTE — Progress Notes (Signed)
Patient pleasant and cooperative during admission assessment. Patient denies SI/HI at this time. Patient denies AVH.Stated that she saw tape worms at her house. Patient informed of fall risk status, fall risk assessed "low" at this time. Patient oriented to unit/staff/room. Patient denies any questions/concerns at this time. Patient safe on unit with Q15 minute checks for safety. Skin assessment & body search done.No contraband found.

## 2016-02-17 NOTE — ED Notes (Signed)
Report called to Gigi in behavioral health.

## 2016-02-17 NOTE — ED Provider Notes (Addendum)
-----------------------------------------   6:59 AM on 02/17/2016 -----------------------------------------   Blood pressure 134/77, pulse (!) 110, temperature 98 F (36.7 C), temperature source Oral, resp. rate 18, height 5\' 1"  (1.549 m), weight 108 lb (49 kg), last menstrual period 03/29/2015, SpO2 100 %.  The patient had no acute events since last update.  Calm and cooperative at this time.  Disposition is pending Psychiatry/Behavioral Medicine team recommendations.     Daymon Larsen, MD 02/17/16 NP:[id number]    Daymon Larsen, MD 02/17/16 [phone]

## 2016-02-17 NOTE — ED Notes (Signed)
Spoke to Dr. Dahlia Client in regards to [facility] recommendations. Of Risperdal 0.5mg  BID and increase as tolerated. Pt supposed to be on Risperdal  2 mg at bedtime per her psychiatrist but wasn't taking. Pt took dose tonight. Dr. Dahlia Client wants to continue 2 mg per her home med list.

## 2016-02-17 NOTE — BHH Suicide Risk Assessment (Signed)
[facility] Admission Suicide Risk Assessment   Nursing information obtained from:    Demographic factors:    Current Mental Status:    Loss Factors:    Historical Factors:    Risk Reduction Factors:     Total Time spent with patient: 1 hour Principal Problem: <principal problem not specified> Diagnosis:   Patient Active Problem List   Diagnosis Date Noted  . Delusional disorder ([place]) [F22] 02/17/2016  . Delusional disorder, somatic type ([place]) [F22] 10/07/2015  . HTN (hypertension) [I10] 10/07/2015  . Noncompliance [Z91.19] 10/06/2015  . Personal history of malignant neoplasm of thyroid [Z85.850] 02/09/2015  . Rosacea [L71.9] 02/09/2015  . Rhinitis, allergic [J30.9] 12/26/2014  . 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 H/O thyroidectomy [E89.0] 05/23/2014  . 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 Reflux [K21.9] 09/26/2012   Subjective Data: psychosis.  Continued Clinical Symptoms:  Alcohol Use Disorder Identification Test Final Score (AUDIT): 0 The "Alcohol Use Disorders Identification Test", Guidelines for Use in Primary Care, Second Edition.  World Pharmacologist [facility]). Score between 0-7:  no or low risk or alcohol related problems. Score between 8-15:  moderate risk of alcohol related problems. Score between 16-19:  high risk of alcohol related problems. Score 20 or above:  warrants further diagnostic evaluation for alcohol dependence and treatment.   CLINICAL FACTORS:   Bipolar Disorder:   Mixed State Currently Psychotic   Musculoskeletal: Strength & Muscle Tone: within normal limits Gait & Station: normal Patient leans: N/A  Psychiatric Specialty Exam: Physical Exam  Nursing note and vitals reviewed.   Review of Systems  Psychiatric/Behavioral: Positive for depression and hallucinations. The patient is nervous/anxious and has insomnia.   All other systems reviewed and are negative.    Blood pressure (!) 144/75, pulse (!) 102, temperature 98.2 F (36.8 C), temperature source Oral, resp. rate 18, height 5\' 1"  (1.549 m), weight 48.1 kg (106 lb), last menstrual period 03/29/2015, SpO2 100 %.Body mass index is 20.03 kg/m.  General Appearance: Casual  Eye Contact:  Good  Speech:  Clear and Coherent  Volume:  Normal  Mood:  Euphoric  Affect:  Appropriate  Thought Process:  Disorganized and Descriptions of Associations: Tangential  Orientation:  Full (Time, Place, and Person)  Thought Content:  Delusions, Hallucinations: Auditory Visual and Paranoid Ideation  Suicidal Thoughts:  No  Homicidal Thoughts:  No  Memory:  Immediate;   Fair Recent;   Fair Remote;   Fair  Judgement:  Poor  Insight:  Lacking  Psychomotor Activity:  Normal  Concentration:  Concentration: Fair and Attention Span: Fair  Recall:  [employer] of Knowledge:  Fair  Language:  Fair  Akathisia:  No  Handed:  Right  AIMS (if indicated):     Assets:  Communication Skills Desire for Improvement Financial Resources/Insurance Housing Physical Health Resilience  ADL's:  Intact  Cognition:  WNL  Sleep:         COGNITIVE FEATURES THAT CONTRIBUTE TO RISK:  None    SUICIDE RISK:   Minimal: No identifiable suicidal ideation.  Patients presenting with no risk factors but with morbid ruminations; may be classified as minimal risk based on the severity of the depressive symptoms   PLAN OF CARE: Hospital admission, medication management, discharge planning.  Mr. Splane is a 55 year old female with a history of psychosis admitted for exacerbation of psychotic symptoms in the context of medication noncompliance.  1. Psychosis. The patient in the past responded well to Risperdal. We  will restart 2 mg of Risperdal at bedtime.  2. Depression. She believes that [place]itos are good for depression.  3. History of thyroid cancer. We will continue Synthroid.  4. Metabolic syndrome monitoring. Lipid  profile, TSH, and hemoglobin A1c were checked in July.  5. Muscle spasms. She is on Flexeril as needed.  6. Constipation. She is on Colace.  7. Disposition. She will return to her apartment. She will follow up with Dr. Einar Grad.  I certify that inpatient services furnished can reasonably be expected to improve the patient's condition.  Orson Slick, MD 02/17/2016, 2:46 PM
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
## 2016-02-17 NOTE — ED Notes (Addendum)
Pt reports abd discomfort. 7/10. When asked about pain points to RLQ. Pt states thinks what she ate yesterday constipated her. Last BM yesterday. Pt states small chance could be pregnant. Abd soft good bowel sounds. When asked for pain description listed several sharp dull achy pressure tightness pt states all of the above. Dr. Dahlia Client made aware.

## 2016-02-17 NOTE — BH Assessment (Signed)
Patient is to be admitted to Psych Inpatient, per [facility]. [facility] attending physician (Dr. Bary Leriche) will writer admission orders..  Attending Physician will be Dr. Bary Leriche.   Patient has been assigned to room 314, by Harvey   Intake Paper Work has been signed and placed on patient chart.  ER staff is aware of the admission Lorenda Ishihara, ER Sect.; Dr. Marcelene Butte ER MD; Sharee Pimple, [place], Patient Access).
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
## 2016-02-17 NOTE — Tx Team (Signed)
Initial Treatment Plan 02/17/2016 6:39 PM Diamond Lucas [id number]    PATIENT STRESSORS: Health problems Medication change or noncompliance   PATIENT STRENGTHS: Average or above average intelligence Capable of independent living Motivation for treatment/growth   PATIENT IDENTIFIED PROBLEMS: Schizophrenia 02/17/2016                     DISCHARGE CRITERIA:  Adequate post-discharge living arrangements Motivation to continue treatment in a less acute level of care Verbal commitment to aftercare and medication compliance  PRELIMINARY DISCHARGE PLAN: Attend aftercare/continuing care group Return to previous living arrangement  PATIENT/FAMILY INVOLVEMENT: This treatment plan has been presented to and reviewed with the patient, Diamond Lucas, and/or family member,  The patient and family have been given the opportunity to ask questions and make suggestions.  Merlene Morse, RN 02/17/2016, 6:39 PM

## 2016-02-18 NOTE — Plan of Care (Signed)
Problem: Safety: Goal: Periods of time without injury will increase Outcome: Progressing Pt remains free from harm.  Problem: Education: Goal: Will be free of psychotic symptoms Outcome: Not Progressing Pt continues to talk about a "wormy wall" at home and appears paranoid.  Problem: Health Behavior/Discharge Planning: Goal: Compliance with prescribed medication regimen will improve Outcome: Not Progressing Pt refuses evening dose of flexeril, reporting that she takes it PRN at home.

## 2016-02-18 NOTE — Progress Notes (Addendum)
Patient has been very anxious and requesting to speak to the MD about prescribing it. States depression 4/10, hopelessness 3/10, and  anxiety 7/10. Denies SI, HI, or AVHShe is very delusional thinking people are trying to get her. Wanted to get discharged and worried about her apartment. Very paranoid about taking medication. This morning only wanted to Risperdal 1mg , even though the order is 2mg . Goal for the day was to talk to the doctor about her fear of tapeworms and plan to discharge tomorrow. Remains on q15 checks for safety. Will continue to monitor
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
## 2016-02-18 NOTE — Plan of Care (Signed)
Problem: Safety: Goal: Periods of time without injury will increase Outcome: Progressing Patient has been free from injury

## 2016-02-18 NOTE — [facility] Group Notes (Signed)
[facility] Group Notes:  (Nursing/MHT/Case Management/Adjunct)  Date:  02/18/2016  Time:  2:53 AM  Type of Therapy:  Psychoeducational Skills  Participation Level:  Active  Participation Quality:  Appropriate  Affect:  Appropriate  Cognitive:  Appropriate  Insight:  Appropriate  Engagement in Group:  Engaged  Modes of Intervention:  Discussion, Socialization and Support  Summary of Progress/Problems:  Diamond Lucas 02/18/2016, 2:53 AM

## 2016-02-18 NOTE — Progress Notes (Signed)
[facility] MD Progress Note  02/18/2016 2:39 PM Diamond Lucas  MRN:  [id number] Subjective:    A she is a 55 year old female with history of bipolar disorder and delusional disorder who presented after a recent discharge from the hospital in July. She came from [facility] crisis center for evaluation and treatment. She was discharged and follow up with Dr. Einar Grad. Her last appointment with Dr. Einar Grad was in October. Apparently the patient stopped taking Risperdal and became increasingly psychotic and disorganized. For the past 2 weeks she developed conviction that there are bugs in her was and possibly an Irishmen is living there as well. She started calling police and fire department requesting that Associate Professor assesses the situation. She believes that she was raped by a leprechaun. Since Monday she has not been able to sleep and has made constant phone calls. She believes that on Monday she called the authorities including in [place] over 50 times.She became increasingly disorganized and bizarre. Police brought her to [facility] crisis center for evaluation. She was sent to our emergency room for further treatment. The patient herself is evasive. She admits to "small" depression. She denies any psychotic symptoms or symptoms suggestive of bipolar mania. She denies heightened anxiety. She does not use drugs or alcohol.  During the interview patient remains focused on the tapeworm  in the wall. She was anxious about medications and reported that she does not want to take the Risperdal as it makes her tired. She was asking about the information on her medications. She was also asking about her discharge today as she reported as she has to go back home and does not want to continue staying in the hospital.   Principal Problem: Bipolar I disorder, most recent episode manic, severe with psychotic features [facility]) Diagnosis:   Patient Active Problem List   Diagnosis Date Noted  . Delusional disorder ([place]) [F22]  02/17/2016  . Bipolar I disorder, most recent episode manic, severe with psychotic features ([place]) [F31.2] 02/17/2016  . Delusional disorder, somatic type ([place]) [F22] 10/07/2015  . HTN (hypertension) [I10] 10/07/2015  . Noncompliance [Z91.19] 10/06/2015  . Personal history of malignant neoplasm of thyroid [Z85.850] 02/09/2015  . Rosacea [L71.9] 02/09/2015  . Rhinitis, allergic [J30.9] 12/26/2014  . 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 H/O thyroidectomy [E89.0] 05/23/2014  . 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 Reflux [K21.9] 09/26/2012   Total Time spent with patient: 20 minutes  Past Psychiatric History: She was diagnosed with mental illness in 1989. She was hospitalized for psychotic breaks 3 or 4 times before.[facility] and [facility] at [facility] in July 2017. She can only tolerate low dose of Risperdal. Reportedly after given Zyprexa during her last hospitalization she fainted, cut her chin and was sutured. She denies ever attempting suicide. She does not remember anything names of previously tried medications.  Past Medical History:  Past Medical History:  Diagnosis Date  . Allergy   . Arrhythmia   . History of chicken pox   . History of measles   . History of syncope   . Osteoarthritis   . Rosacea   . Schizophrenia ([place])   . Thyroid disease     Past Surgical History:  Procedure Laterality Date  . FOOT SURGERY Right   . THYROIDECTOMY     Family History:  Family History  Problem Relation Age of Onset  . Depression Mother   . Cataracts Mother   .  Cancer Other   . Heart failure Other   . Heart disease Other   . Stroke Other   . Diabetes Other   . Kidney cancer Father   . Anxiety disorder Brother    Family Psychiatric  History: none  Social History:  History  Alcohol Use No     History  Drug Use No    Social History    Social History  . Marital status: Single    Spouse name: N/A  . Number of children: N/A  . Years of education: N/A   Social History Main Topics  . Smoking status: Never Smoker  . Smokeless tobacco: Never Used  . Alcohol use No  . Drug use: No  . Sexual activity: Not Currently    Birth control/ protection: Post-menopausal   Other Topics Concern  . None   Social History Narrative  . None   Additional Social History:    History of alcohol / drug use?: No history of alcohol / drug abuse                    Sleep: Fair  Appetite:  Fair  Current Medications: Current Facility-Administered Medications  Medication Dose Route Frequency Provider Last Rate Last Dose  . acetaminophen (TYLENOL) tablet 650 mg  650 mg Oral Q6H PRN Jolanta B Pucilowska, MD      . alum & mag hydroxide-simeth (MAALOX/MYLANTA) 200-200-20 MG/5ML suspension 30 mL  30 mL Oral Q4H PRN Jolanta B Pucilowska, MD      . calcium-vitamin D (OSCAL WITH D) 500-200 MG-UNIT per tablet 1 tablet  1 tablet Oral BID Clovis Fredrickson, MD   1 tablet at 02/17/16 1741  . cyclobenzaprine (FLEXERIL) tablet 5 mg  5 mg Oral QHS Jolanta B Pucilowska, MD      . cycloSPORINE (RESTASIS) 0.05 % ophthalmic emulsion 1 drop  1 drop Both Eyes BID Clovis Fredrickson, MD   1 drop at 02/18/16 0850  . docusate sodium (COLACE) capsule 100 mg  100 mg Oral Daily Jolanta B Pucilowska, MD   100 mg at 02/18/16 0848  . fluticasone (FLONASE) 50 MCG/ACT nasal spray 1 spray  1 spray Each Nare Daily Jolanta B Pucilowska, MD      . ibuprofen (ADVIL,MOTRIN) tablet 600 mg  600 mg Oral Q6H PRN Jolanta B Pucilowska, MD      . levothyroxine (SYNTHROID, LEVOTHROID) tablet 75 mcg  75 mcg Oral QAC breakfast Clovis Fredrickson, MD   75 mcg at 02/18/16 0620  . magnesium hydroxide (MILK OF MAGNESIA) suspension 30 mL  30 mL Oral Daily PRN Jolanta B Pucilowska, MD      . metoprolol succinate (TOPROL-XL) 24 hr tablet 12.5 mg  12.5 mg Oral Daily Jolanta B  Pucilowska, MD      . multivitamin with minerals tablet 1 tablet  1 tablet Oral Daily Clovis Fredrickson, MD   1 tablet at 02/18/16 0849  . risperiDONE (RISPERDAL) tablet 2 mg  2 mg Oral BID Clovis Fredrickson, MD   1 mg at 02/18/16 0848    Lab Results:  Results for orders placed or performed during the hospital encounter of 02/16/16 (from the past 48 hour(s))  Comprehensive metabolic panel     Status: Abnormal   Collection Time: 02/16/16  6:20 PM  Result Value Ref Range   Sodium 139 135 - 145 mmol/L   Potassium 4.1 3.5 - 5.1 mmol/L   Chloride 101 101 - 111 mmol/L   CO2 30  22 - 32 mmol/L   Glucose, Bld 100 (H) 65 - 99 mg/dL   BUN 16 6 - 20 mg/dL   Creatinine, Ser 0.76 0.44 - 1.00 mg/dL   Calcium 9.0 8.9 - 10.3 mg/dL   Total Protein 7.7 6.5 - 8.1 g/dL   Albumin 4.4 3.5 - 5.0 g/dL   AST 23 15 - 41 U/L   ALT 19 14 - 54 U/L   Alkaline Phosphatase 66 38 - 126 U/L   Total Bilirubin 0.6 0.3 - 1.2 mg/dL   GFR calc non Af Amer >60 >60 mL/min   GFR calc Af Amer >60 >60 mL/min    Comment: (NOTE) The eGFR has been calculated using the CKD EPI equation. This calculation has not been validated in all clinical situations. eGFR's persistently <60 mL/min signify possible Chronic Kidney Disease.    Anion gap 8 5 - 15  Ethanol     Status: None   Collection Time: 02/16/16  6:20 PM  Result Value Ref Range   Alcohol, Ethyl (B) <5 <5 mg/dL    Comment:        LOWEST DETECTABLE LIMIT FOR SERUM ALCOHOL IS 5 mg/dL FOR MEDICAL PURPOSES ONLY   Salicylate level     Status: None   Collection Time: 02/16/16  6:20 PM  Result Value Ref Range   Salicylate Lvl <2.8 2.8 - 30.0 mg/dL  Acetaminophen level     Status: Abnormal   Collection Time: 02/16/16  6:20 PM  Result Value Ref Range   Acetaminophen (Tylenol), Serum <10 (L) 10 - 30 ug/mL    Comment:        THERAPEUTIC CONCENTRATIONS VARY SIGNIFICANTLY. A RANGE OF 10-30 ug/mL MAY BE AN EFFECTIVE CONCENTRATION FOR MANY PATIENTS. HOWEVER, SOME ARE  BEST TREATED AT CONCENTRATIONS OUTSIDE THIS RANGE. ACETAMINOPHEN CONCENTRATIONS >150 ug/mL AT 4 HOURS AFTER INGESTION AND >50 ug/mL AT 12 HOURS AFTER INGESTION ARE OFTEN ASSOCIATED WITH TOXIC REACTIONS.   cbc     Status: None   Collection Time: 02/16/16  6:20 PM  Result Value Ref Range   WBC 7.5 3.6 - 11.0 K/uL   RBC 4.81 3.80 - 5.20 MIL/uL   Hemoglobin 15.2 12.0 - 16.0 g/dL   HCT 44.4 35.0 - 47.0 %   MCV 92.4 80.0 - 100.0 fL   MCH 31.6 26.0 - 34.0 pg   MCHC 34.2 32.0 - 36.0 g/dL   RDW 13.1 11.5 - 14.5 %   Platelets 258 150 - 440 K/uL  Urine Drug Screen, Qualitative     Status: None   Collection Time: 02/16/16  6:20 PM  Result Value Ref Range   Tricyclic, Ur Screen NONE DETECTED NONE DETECTED   Amphetamines, Ur Screen NONE DETECTED NONE DETECTED   MDMA (Ecstasy)Ur Screen NONE DETECTED NONE DETECTED   Cocaine Metabolite,Ur [place] NONE DETECTED NONE DETECTED   Opiate, Ur Screen NONE DETECTED NONE DETECTED   Phencyclidine (PCP) Ur S NONE DETECTED NONE DETECTED   Cannabinoid 50 Ng, Ur [place] NONE DETECTED NONE DETECTED   Barbiturates, Ur Screen NONE DETECTED NONE DETECTED   Benzodiazepine, Ur Scrn NONE DETECTED NONE DETECTED   Methadone Scn, Ur NONE DETECTED NONE DETECTED    Comment: (NOTE) 366  Tricyclics, urine               Cutoff 1000 ng/mL 200  Amphetamines, urine             Cutoff 1000 ng/mL 300  MDMA (Ecstasy), urine  Cutoff 500 ng/mL 400  Cocaine Metabolite, urine       Cutoff 300 ng/mL 500  Opiate, urine                   Cutoff 300 ng/mL 600  Phencyclidine (PCP), urine      Cutoff 25 ng/mL 700  Cannabinoid, urine              Cutoff 50 ng/mL 800  Barbiturates, urine             Cutoff 200 ng/mL 900  Benzodiazepine, urine           Cutoff 200 ng/mL 1000 Methadone, urine                Cutoff 300 ng/mL 1100 1200 The urine drug screen provides only a preliminary, unconfirmed 1300 analytical test result and should not be used for non-medical 1400 purposes.  Clinical consideration and professional judgment should 1500 be applied to any positive drug screen result due to possible 1600 interfering substances. A more specific alternate chemical method 1700 must be used in order to obtain a confirmed analytical result.  1800 Gas chromato graphy / mass spectrometry (GC/MS) is the preferred 1900 confirmatory method.   Urinalysis complete, with microscopic ([facility] only)     Status: Abnormal   Collection Time: 02/16/16  6:20 PM  Result Value Ref Range   Color, Urine STRAW (A) YELLOW   APPearance CLEAR (A) CLEAR   Glucose, UA NEGATIVE NEGATIVE mg/dL   Bilirubin Urine NEGATIVE NEGATIVE   Ketones, ur NEGATIVE NEGATIVE mg/dL   Specific Gravity, Urine 1.008 1.005 - 1.030   Hgb urine dipstick NEGATIVE NEGATIVE   pH 7.0 5.0 - 8.0   Protein, ur NEGATIVE NEGATIVE mg/dL   Nitrite NEGATIVE NEGATIVE   Leukocytes, UA NEGATIVE NEGATIVE   RBC / HPF 0-5 0 - 5 RBC/hpf   WBC, UA 0-5 0 - 5 WBC/hpf   Bacteria, UA NONE SEEN NONE SEEN   Squamous Epithelial / LPF 0-5 (A) NONE SEEN  TSH     Status: None   Collection Time: 02/16/16  6:20 PM  Result Value Ref Range   TSH 0.427 0.350 - 4.500 uIU/mL    Comment: Performed by a 3rd Generation assay with a functional sensitivity of <=0.01 uIU/mL.    Blood Alcohol level:  Lab Results  Component Value Date   ETH <5 64/38/3818    Metabolic Disorder Labs: Lab Results  Component Value Date   HGBA1C 5.0 10/07/2015   Lab Results  Component Value Date   PROLACTIN 10.7 10/07/2015   Lab Results  Component Value Date   CHOL 275 (H) 10/07/2015   TRIG 106 10/07/2015   HDL 90 10/07/2015   CHOLHDL 3.1 10/07/2015   VLDL 21 10/07/2015   LDLCALC 164 (H) 10/07/2015    Physical Findings: AIMS:  , ,  ,  ,    CIWA:    COWS:     Musculoskeletal: Strength & Muscle Tone: within normal limits Gait & Station: normal Patient leans: N/A  Psychiatric Specialty Exam: Physical Exam  ROS  Blood pressure (!) 129/94, pulse  91, temperature 98.3 F (36.8 C), temperature source Oral, resp. rate 18, height 5' 1"  (1.549 m), weight 106 lb (48.1 kg), last menstrual period 03/29/2015, SpO2 100 %.Body mass index is 20.03 kg/m.  General Appearance: Casual  Eye Contact:  Fair  Speech:  Pressured  Volume:  Normal  Mood:  Anxious and Dysphoric  Affect:  Congruent  Thought Process:  Irrelevant  Orientation:  Full (Time, Place, and Person)  Thought Content:  Delusions, Paranoid Ideation and Tangential  Suicidal Thoughts:  No  Homicidal Thoughts:  No  Memory:  Immediate;   Fair Recent;   Fair Remote;   Fair  Judgement:  Impaired  Insight:  Lacking  Psychomotor Activity:  Decreased  Concentration:  Concentration: Poor and Attention Span: Poor  Recall:  [employer] of Knowledge:  Fair  Language:  Fair  Akathisia:  No  Handed:  Right  AIMS (if indicated):     Assets:  Communication Skills Housing  ADL's:  Intact  Cognition:  WNL  Sleep:  Number of Hours: 6.75     Treatment Plan Summary: Daily contact with patient to assess and evaluate symptoms and progress in treatment and Medication management    Mr. Virden is a 55 year old female with a history of psychosis admitted for exacerbation of psychotic symptoms in the context of medication noncompliance.  1. Psychosis. The patient in the past responded well to Risperdal. We will restart 2 mg of Risperdal at bedtime.  2. Depression. She believes that Doritos are good for depression.  3. History of thyroid cancer. We will continue Synthroid.  4. Metabolic syndrome monitoring. Lipid profile, TSH, and hemoglobin A1c were checked in July.  5. Muscle spasms. She is on Flexeril as needed.  6. Constipation. She is on Colace.  7. Hypertension. The patient has elevated blood pressure and tachycardia. Will start metoprolol.   8. Disposition. She will return to her apartment. She will follow up with Dr. Einar Grad.    Rainey Pines, MD 02/18/2016, 2:39  PM

## 2016-02-18 NOTE — Progress Notes (Signed)
D: Patient is alert and  mildly confused and delusional on the unit this shift. Patient attended   groups today. Patient denies suicidal ideation, homicidal ideation, auditory or visual hallucinations at the present time.  A: Scheduled medications are administered to patient as per MD orders. Emotional support and encouragement are provided. Patient is maintained on q.15 minute safety checks. Patient is informed to notify staff with questions or concerns. R: No adverse medication reactions are noted. Patient is cooperative with some  medication administration and partial treatment plan today. Patient is receptive,  Guarded and cooperative with some things on the unit at this time. Patient does not interact  with others on the unit this shift. Patient contracts for safety at this time. Patient remains safe at this time.

## 2016-02-18 NOTE — Plan of Care (Signed)
Problem: Coping: Goal: Ability to cope will improve Outcome: Not Progressing Patient has not learned coping skills at this time [facility] RN

## 2016-02-19 NOTE — Progress Notes (Signed)
Patient is pleasant & cooperative on approach.Denies suicidal or homicidal ideations & AV hallucinations.Patient is delusional about the tapeworm in the wall.Patient took only 1 mg of Risperdal.Patient is very anxious about the medicines.Attebded groups.Appetite & energy level good.Support & encouragement given.

## 2016-02-19 NOTE — Progress Notes (Signed)
[facility] MD Progress Note  02/19/2016 5:24 PM Diamond Lucas  MRN:  [id number] Subjective:    Pt is a 55 year old female with history of bipolar disorder and delusional disorder who presented after a recent discharge from the hospital in July. She came from [facility] crisis center for evaluation and treatment.  During my interview patient continues to remain delusional about her medications. She has only taken 1 mg of Risperdal this morning. She remains delusional about her medications. She reported that the medication makes her tired and was talking about the tapeworm in her apartment. She was also talking about the discharge planning.   According to her records,  for the past 2 weeks she developed conviction that there are bugs in her was and possibly an Irishmen is living there as well. She started calling police and fire department requesting that Associate Professor assesses the situation. She believes that she was raped by a leprechaun. Since Monday she has not been able to sleep and has made constant phone calls. She believes that on Monday she called the authorities including in [place] over 50 times.She became increasingly disorganized and bizarre. Police brought her to [facility] crisis center for evaluation. She was sent to our emergency room for further treatment. The patient herself is evasive. She admits to "small" depression.    During the interview patient remains focused on the tapeworm  in the wall. She was anxious about medications and reported that she does not want to take the Risperdal as it makes her tired. She was asking about the information on her medications. She was also asking about her discharge today as she reported as she has to go back home and does not want to continue staying in the hospital.   Principal Problem: Bipolar I disorder, most recent episode manic, severe with psychotic features [facility]) Diagnosis:   Patient Active Problem List   Diagnosis Date Noted  .  Delusional disorder ([place]) [F22] 02/17/2016  . Bipolar I disorder, most recent episode manic, severe with psychotic features ([place]) [F31.2] 02/17/2016  . Delusional disorder, somatic type ([place]) [F22] 10/07/2015  . HTN (hypertension) [I10] 10/07/2015  . Noncompliance [Z91.19] 10/06/2015  . Personal history of malignant neoplasm of thyroid [Z85.850] 02/09/2015  . Rosacea [L71.9] 02/09/2015  . Rhinitis, allergic [J30.9] 12/26/2014  . 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 H/O thyroidectomy [E89.0] 05/23/2014  . 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 Reflux [K21.9] 09/26/2012   Total Time spent with patient: 20 minutes  Past Psychiatric History: She was diagnosed with mental illness in 1989. She was hospitalized for psychotic breaks 3 or 4 times before.There is one [facility] and [facility] at [facility] in July 2017. She can only tolerate low dose of Risperdal. Reportedly after given Zyprexa during her last hospitalization she fainted, cut her chin and was sutured. She denies ever attempting suicide. She does not remember anything names of previously tried medications.  Past Medical History:  Past Medical History:  Diagnosis Date  . Allergy   . Arrhythmia   . History of chicken pox   . History of measles   . History of syncope   . Osteoarthritis   . Rosacea   . Schizophrenia ([place])   . Thyroid disease     Past Surgical History:  Procedure Laterality Date  . FOOT SURGERY Right   . THYROIDECTOMY     Family History:  Family History  Problem Relation Age of Onset  .  Depression Mother   . Cataracts Mother   . Cancer Other   . Heart failure Other   . Heart disease Other   . Stroke Other   . Diabetes Other   . Kidney cancer Father   . Anxiety disorder Brother    Family Psychiatric  History: none  Social History:  History  Alcohol Use No     History   Drug Use No    Social History   Social History  . Marital status: Single    Spouse name: N/A  . Number of children: N/A  . Years of education: N/A   Social History Main Topics  . Smoking status: Never Smoker  . Smokeless tobacco: Never Used  . Alcohol use No  . Drug use: No  . Sexual activity: Not Currently    Birth control/ protection: Post-menopausal   Other Topics Concern  . None   Social History Narrative  . None   Additional Social History:    History of alcohol / drug use?: No history of alcohol / drug abuse                    Sleep: Fair  Appetite:  Fair  Current Medications: Current Facility-Administered Medications  Medication Dose Route Frequency Provider Last Rate Last Dose  . acetaminophen (TYLENOL) tablet 650 mg  650 mg Oral Q6H PRN Jolanta B Pucilowska, MD      . alum & mag hydroxide-simeth (MAALOX/MYLANTA) 200-200-20 MG/5ML suspension 30 mL  30 mL Oral Q4H PRN Jolanta B Pucilowska, MD      . calcium-vitamin D (OSCAL WITH D) 500-200 MG-UNIT per tablet 1 tablet  1 tablet Oral BID Clovis Fredrickson, MD   1 tablet at 02/17/16 1741  . cyclobenzaprine (FLEXERIL) tablet 5 mg  5 mg Oral QHS Jolanta B Pucilowska, MD      . cycloSPORINE (RESTASIS) 0.05 % ophthalmic emulsion 1 drop  1 drop Both Eyes BID Clovis Fredrickson, MD   1 drop at 02/19/16 0745  . docusate sodium (COLACE) capsule 100 mg  100 mg Oral Daily Jolanta B Pucilowska, MD   100 mg at 02/19/16 0752  . fluticasone (FLONASE) 50 MCG/ACT nasal spray 1 spray  1 spray Each Nare Daily Jolanta B Pucilowska, MD      . ibuprofen (ADVIL,MOTRIN) tablet 600 mg  600 mg Oral Q6H PRN Jolanta B Pucilowska, MD      . levothyroxine (SYNTHROID, LEVOTHROID) tablet 75 mcg  75 mcg Oral QAC breakfast Clovis Fredrickson, MD   75 mcg at 02/19/16 0743  . magnesium hydroxide (MILK OF MAGNESIA) suspension 30 mL  30 mL Oral Daily PRN Jolanta B Pucilowska, MD      . metoprolol succinate (TOPROL-XL) 24 hr tablet 12.5  mg  12.5 mg Oral Daily Jolanta B Pucilowska, MD      . multivitamin with minerals tablet 1 tablet  1 tablet Oral Daily Clovis Fredrickson, MD   1 tablet at 02/19/16 0743  . risperiDONE (RISPERDAL) tablet 2 mg  2 mg Oral BID Clovis Fredrickson, MD   1 mg at 02/19/16 0745    Lab Results:  No results found for this or any previous visit (from the past 48 hour(s)).  Blood Alcohol level:  Lab Results  Component Value Date   ETH <5 99991111    Metabolic Disorder Labs: Lab Results  Component Value Date   HGBA1C 5.0 10/07/2015   Lab Results  Component Value Date   PROLACTIN  10.7 10/07/2015   Lab Results  Component Value Date   CHOL 275 (H) 10/07/2015   TRIG 106 10/07/2015   HDL 90 10/07/2015   CHOLHDL 3.1 10/07/2015   VLDL 21 10/07/2015   LDLCALC 164 (H) 10/07/2015    Physical Findings: AIMS:  , ,  ,  ,    CIWA:    COWS:     Musculoskeletal: Strength & Muscle Tone: within normal limits Gait & Station: normal Patient leans: N/A  Psychiatric Specialty Exam: Physical Exam   ROS   Blood pressure 137/82, pulse 86, temperature 98 F (36.7 C), temperature source Oral, resp. rate 18, height 5\' 1"  (1.549 m), weight 106 lb (48.1 kg), last menstrual period 03/29/2015, SpO2 100 %.Body mass index is 20.03 kg/m.  General Appearance: Casual  Eye Contact:  Fair  Speech:  Pressured  Volume:  Normal  Mood:  Anxious and Dysphoric  Affect:  Congruent  Thought Process:  Irrelevant  Orientation:  Full (Time, Place, and Person)  Thought Content:  Delusions, Paranoid Ideation and Tangential  Suicidal Thoughts:  No  Homicidal Thoughts:  No  Memory:  Immediate;   Fair Recent;   Fair Remote;   Fair  Judgement:  Impaired  Insight:  Lacking  Psychomotor Activity:  Decreased  Concentration:  Concentration: Poor and Attention Span: Poor  Recall:  [employer] of Knowledge:  Fair  Language:  Fair  Akathisia:  No  Handed:  Right  AIMS (if indicated):     Assets:  Communication  Skills Housing  ADL's:  Intact  Cognition:  WNL  Sleep:  Number of Hours: 5.15     Treatment Plan Summary: Daily contact with patient to assess and evaluate symptoms and progress in treatment and Medication management    Mr. Oesterreich is a 55 year old female with a history of psychosis admitted for exacerbation of psychotic symptoms in the context of medication noncompliance.  1. Psychosis. The patient in the past responded well to Risperdal. We will restart 2 mg of Risperdal at bedtime.  2. Depression. She believes that Doritos are good for depression.  3. History of thyroid cancer. We will continue Synthroid.  4. Metabolic syndrome monitoring. Lipid profile, TSH, and hemoglobin A1c were checked in July.  5. Muscle spasms. She is on Flexeril as needed.  6. Constipation. She is on Colace.  7. Hypertension. The patient has elevated blood pressure and tachycardia. Will start metoprolol.   8. Disposition. She will return to her apartment. She will follow up with Dr. Einar Grad.    Rainey Pines, MD 02/19/2016, 5:24 PM

## 2016-02-19 NOTE — Social Work (Signed)
[place] LCSW Group Therapy   02/19/2016 9:30AM   Type of Therapy: Group Therapy   Participation Level: Active   Participation Quality: Attentive, Sharing and Supportive   Affect: Appropriate   Cognitive: Alert and Oriented   Insight: Developing/Improving and Engaged   Engagement in Therapy: Developing/Improving and Engaged   Modes of Intervention: Clarification, Confrontation, Discussion, Education, Exploration, Limit-setting, Orientation, Problem-solving, Rapport Building, Art therapist, Socialization and Support   Summary of Progress/Problems: The topic for today was feelings about relapse. Pt discussed what relapse prevention is to them and identified triggers that they are on the path to relapse. Pt processed their feeling towards relapse and was able to relate to peers. Pt discussed coping skills that can be used for relapse prevention. Pt defined a relapse as "a reoccurrence or a fall back." Pt identified relaxation techniques and breathing exercises as coping mechanisms to reduce exposure to triggers and decrease chances of relapse.     Glorious Peach, MSW, LCSWA 02/19/2016, 11:10AM

## 2016-02-19 NOTE — BHH Suicide Risk Assessment (Signed)
[facility] INPATIENT:  Family/Significant Other Suicide Prevention Education  Suicide Prevention Education:  Patient Refusal for Family/Significant Other Suicide Prevention Education: The patient Diamond Lucas has refused to provide written consent for family/significant other to be provided Family/Significant Other Suicide Prevention Education during admission and/or prior to discharge.  Physician notified.  Emilie Rutter, MSW, LCSW-A 02/19/2016, 11:47 AM

## 2016-02-19 NOTE — BHH Group Notes (Signed)
[place] Group Notes:  (Nursing/MHT/Case Management/Adjunct)  Date:  02/19/2016  Time:  3:27 AM  Type of Therapy:  Group Therapy  Participation Level:  Active  Participation Quality:  Attentive  Affect:  Appropriate  Cognitive:  Appropriate  Insight:  Lacking  Engagement in Group:  Engaged  Modes of Intervention:  Discussion  Summary of Progress/Problems: Pt stated that goal was to leave tomorrow. Staff made pt aware that may not be possible since it was a Holiday weekend. Pt seemed agitated by this news and begin to whisper repeatedly that she's leaving tomorrow.   Jenetta Downer Jaylei Fuerte 02/19/2016, 3:27 AM

## 2016-02-19 NOTE — Plan of Care (Signed)
Problem: Coping: Goal: Ability to cope will improve Outcome: Not Progressing Still delusional about tapeworm on her walls.

## 2016-02-20 DIAGNOSIS — R9431 Abnormal electrocardiogram [ECG] [EKG]: Secondary | ICD-10-CM | POA: Diagnosis not present

## 2016-02-20 MED ORDER — PSEUDOEPHEDRINE HCL ER 120 MG PO TB12: 120.0000 mg | ORAL_TABLET | Freq: Two times a day (BID) | ORAL | Status: DC | PRN

## 2016-02-20 MED ORDER — CITALOPRAM HYDROBROMIDE 20 MG PO TABS
20.0000 mg | ORAL_TABLET | Freq: Every day | ORAL | Status: DC
  Filled 2016-02-20 (×3): qty 1

## 2016-02-20 NOTE — Progress Notes (Signed)
Pt requesting decongestant/ [facility]. MD on call had been paged x 3 without success. Phone attempt made x 1.

## 2016-02-20 NOTE — BHH Group Notes (Signed)
[place] Group Notes:  (Nursing/MHT/Case Management/Adjunct)  Date:  02/20/2016  Time:  11:38 PM  Type of Therapy:  Psychoeducational Skills  Participation Level:  Did Not Attend   Summary of Progress/Problems:  Kathi Ludwig 02/20/2016, 11:38 PM
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
## 2016-02-20 NOTE — B[facility] Group Notes (Signed)
[place] LCSW Group Therapy  02/20/2016 2:17 PM  Type of Therapy:  Group Therapy  Participation Level:  Active  Participation Quality:  Attentive and Sharing  Affect:  Appropriate  Cognitive:  Alert  Insight:  Limited  Engagement in Therapy:  Engaged  Modes of Intervention:  Activity, Discussion, Education and Support  Summary of Progress/Problems:Coping Skills: Patients defined and discussed healthy coping skills. Patients identified healthy coping skills they would like to try during hospitalization and after discharge. CSW offered insight to varying coping skills that may have been new to patients such as practicing mindfulness.  Pierrette Scheu G. [place], [place] 02/20/2016, 2:18 PM

## 2016-02-20 NOTE — Progress Notes (Signed)
Pt has been pleasant and cooperative. Pt's mood and affect has been depressed. Pt continues to refuse taking some meds. Pt needs lots of redirecting.

## 2016-02-20 NOTE — Progress Notes (Signed)
Pt isolated to room the majority of shift. Staff observed pt walking around in her room talking to herself. Denies pain. Refused scheduled Flexeril. Voices no additional concerns at this time. Will continue to monitor.

## 2016-02-21 NOTE — Progress Notes (Signed)
Pt refused to take synthroid until after her blood was drawn to test her levels.

## 2016-02-21 NOTE — B[facility] Group Notes (Signed)
[facility] LCSW Group Therapy  02/21/2016 2:42 PM  Type of Therapy:  Group Therapy  Participation Level:  Minimal  Participation Quality:  Attentive  Affect:  Flat  Cognitive:  Alert  Insight:  None  Engagement in Therapy:  Limited  Modes of Intervention:  Activity, Clarification, Discussion, Education, Problem-solving, Reality Testing, Socialization and Support  Summary of Progress/Problems:Stress management: Patients defined and discussed the topic of stress and the related symptoms and triggers for stress. Patients identified healthy coping skills they would like to try during hospitalization and after discharge to manage stress in a healthy way. CSW offered insight to varying stress management techniques. Patient attended group on this date and minimally participated in the group discussion.   Kassondra Geil G. [place], [place] 02/21/2016, 2:44 PM

## 2016-02-21 NOTE — Plan of Care (Signed)
Problem: Activity: Goal: Interest or engagement in activities will improve Outcome: Progressing Patient has been interacting more with peers on the unit

## 2016-02-21 NOTE — Progress Notes (Signed)
Patient has been in and out of room all day walking the halls. Did not attend group. Denies SI/HI or AVH. Preoccupied with discharging. Still not taking all medication as prescribed and refusing some as well. Is also preoccupied with lab results, which were not done. States she slept well and has been eating all meals. Requesting to talk to outside doctor's about her health status. Does not want to take anti-depressants because they are interfering with her thyroid medication. Still delusional. Remains on Q15 minute checks for safety. Will continue to monitor.

## 2016-02-21 NOTE — Progress Notes (Signed)
[facility] MD Progress Note  02/21/2016 4:20 PM Diamond Lucas  MRN:  [id number] Subjective:    Pt is a 55 year old female with history of bipolar disorder and delusional disorder who presented after a recent discharge from the hospital in July. She came from [facility] crisis center for evaluation and treatment. According to her records,  for the past 2 weeks she developed conviction that there are bugs in her was and possibly an Irishmen is living there as well. She started calling police and fire department requesting that Associate Professor assesses the situation. She believes that she was raped by a leprechaun. Since Monday she has not been able to sleep and has made constant phone calls. She believes that on Monday she called the authorities including in [place] over 50 times.She became increasingly disorganized and bizarre. Police brought her to [place] crisis center for evaluation. She was sent to our emergency room for further treatment. The patient herself is evasive. She admits to "small" depression.   The patient remains delusional and psychotic. Today, the patient is focused on medications harming her baby. She continues to believe that she is pregnant despite negative pregnancy test. The patient states that she will not take Celexa because she fears that side effects may harm her baby. She continues to believe that there are bugs in her home but denies any current auditory or visual hallucinations. She has been compliant with Risperdal 2 mg by mouth twice a day but feels like the medication is causing dry mouth and making her feel tired. Continues to have very poor insight. The patient has been attending groups on the unit and interacting well with staff and peers. She denies any current active or passive suicidal thoughts. She is anxious for discharge and does not feel like she needs hospitalization or medications. She is no longer having problems with nasal congestion.  The patient admits that she  is not taking the full Risperdal 2 mg tablet and says that she can only take half of the tablet. She was evasive about what she is doing with the other 1 mg. Order was changed to take 4 of the 1 mg tablets, 2 mg twice a day which she was agreeable to.     Principal Problem: Bipolar I disorder, most recent episode manic, severe with psychotic features ([place]) Diagnosis:   Patient Active Problem List   Diagnosis Date Noted  . Delusional disorder ([place]) [F22] 02/17/2016  . Bipolar I disorder, most recent episode manic, severe with psychotic features ([place]) [F31.2] 02/17/2016  . Delusional disorder, somatic type ([place]) [F22] 10/07/2015  . HTN (hypertension) [I10] 10/07/2015  . Noncompliance [Z91.19] 10/06/2015  . Personal history of malignant neoplasm of thyroid [Z85.850] 02/09/2015  . Rosacea [L71.9] 02/09/2015  . Rhinitis, allergic [J30.9] 12/26/2014  . 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 H/O thyroidectomy [E89.0] 05/23/2014  . 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 Reflux [K21.9] 09/26/2012   Total Time spent with patient: 20 minutes  Past Psychiatric History: She was diagnosed with mental illness in 1989. She was hospitalized for psychotic breaks 3 or 4 times before.There is one hospitalization at [facility] and recent hospitalization at [facility] in July 2017. She can only tolerate low dose of Risperdal. Reportedly after given Zyprexa during her last hospitalization she fainted, cut her chin and was sutured. She denies ever attempting suicide. She does not remember anything names of previously tried medications.  Past Medical History:  Past Medical  History:  Diagnosis Date  . Allergy   . Arrhythmia   . History of chicken pox   . History of measles   . History of syncope   . Osteoarthritis   . Rosacea   . Schizophrenia ([place])   . Thyroid disease     Past Surgical History:  Procedure  Laterality Date  . FOOT SURGERY Right   . THYROIDECTOMY     Family History:  Family History  Problem Relation Age of Onset  . Depression Mother   . Cataracts Mother   . Cancer Other   . Heart failure Other   . Heart disease Other   . Stroke Other   . Diabetes Other   . Kidney cancer Father   . Anxiety disorder Brother    Family Psychiatric  History: none  Social History:  History  Alcohol Use No     History  Drug Use No    Social History   Social History  . Marital status: Single    Spouse name: N/A  . Number of children: N/A  . Years of education: N/A   Social History Main Topics  . Smoking status: Never Smoker  . Smokeless tobacco: Never Used  . Alcohol use No  . Drug use: No  . Sexual activity: Not Currently    Birth control/ protection: Post-menopausal   Other Topics Concern  . None   Social History Narrative  . None   Additional Social History:    History of alcohol / drug use?: No history of alcohol / drug abuse   Sleep: Fair  Appetite:  Fair  Current Medications: Current Facility-Administered Medications  Medication Dose Route Frequency Provider Last Rate Last Dose  . acetaminophen (TYLENOL) tablet 650 mg  650 mg Oral Q6H PRN Jolanta B Pucilowska, MD      . alum & mag hydroxide-simeth (MAALOX/MYLANTA) 200-200-20 MG/5ML suspension 30 mL  30 mL Oral Q4H PRN Jolanta B Pucilowska, MD      . calcium-vitamin D (OSCAL WITH D) 500-200 MG-UNIT per tablet 1 tablet  1 tablet Oral BID Clovis Fredrickson, MD   1 tablet at 02/21/16 0840  . citalopram (CELEXA) tablet 20 mg  20 mg Oral Daily Chauncey Mann, MD      . cyclobenzaprine (FLEXERIL) tablet 5 mg  5 mg Oral QHS Jolanta B Pucilowska, MD      . cycloSPORINE (RESTASIS) 0.05 % ophthalmic emulsion 1 drop  1 drop Both Eyes BID Clovis Fredrickson, MD   1 drop at 02/21/16 0841  . docusate sodium (COLACE) capsule 100 mg  100 mg Oral Daily Jolanta B Pucilowska, MD   100 mg at 02/21/16 0840  . fluticasone  (FLONASE) 50 MCG/ACT nasal spray 1 spray  1 spray Each Nare Daily Clovis Fredrickson, MD   1 spray at 02/21/16 0840  . ibuprofen (ADVIL,MOTRIN) tablet 600 mg  600 mg Oral Q6H PRN Jolanta B Pucilowska, MD      . levothyroxine (SYNTHROID, LEVOTHROID) tablet 75 mcg  75 mcg Oral QAC breakfast Clovis Fredrickson, MD   75 mcg at 02/21/16 0634  . magnesium hydroxide (MILK OF MAGNESIA) suspension 30 mL  30 mL Oral Daily PRN Jolanta B Pucilowska, MD      . metoprolol succinate (TOPROL-XL) 24 hr tablet 12.5 mg  12.5 mg Oral Daily Jolanta B Pucilowska, MD   12.5 mg at 02/20/16 0915  . multivitamin with minerals tablet 1 tablet  1 tablet Oral Daily Clovis Fredrickson, MD  1 tablet at 02/21/16 0839  . pseudoephedrine (SUDAFED) 12 hr tablet 120 mg  120 mg Oral Q12H PRN Chauncey Mann, MD      . risperiDONE (RISPERDAL) tablet 2 mg  2 mg Oral BID Chauncey Mann, MD   1 mg at 02/21/16 [id number]    Lab Results:  No results found for this or any previous visit (from the past 37 hour(s)).  Blood Alcohol level:  Lab Results  Component Value Date   ETH <5 99991111    Metabolic Disorder Labs: Lab Results  Component Value Date   HGBA1C 5.0 10/07/2015   Lab Results  Component Value Date   PROLACTIN 10.7 10/07/2015   Lab Results  Component Value Date   CHOL 275 (H) 10/07/2015   TRIG 106 10/07/2015   HDL 90 10/07/2015   CHOLHDL 3.1 10/07/2015   VLDL 21 10/07/2015   LDLCALC 164 (H) 10/07/2015     Musculoskeletal: Strength & Muscle Tone: within normal limits Gait & Station: normal Patient leans: N/A  Psychiatric Specialty Exam: Physical Exam   Review of Systems  Constitutional: Negative.  Negative for chills, diaphoresis, fever, malaise/fatigue and weight loss.  HENT: Negative.  Negative for ear pain, hearing loss and tinnitus.   Eyes: Negative.  Negative for blurred vision, double vision, photophobia, pain and discharge.  Respiratory: Negative.  Negative for cough, hemoptysis, sputum  production and shortness of breath.   Cardiovascular: Negative.  Negative for chest pain, palpitations, orthopnea, claudication and leg swelling.  Gastrointestinal: Negative.  Negative for abdominal pain, blood in stool, constipation, diarrhea, heartburn, nausea and vomiting.  Genitourinary: Negative.  Negative for dysuria, frequency and urgency.  Musculoskeletal: Negative.  Negative for back pain, joint pain, myalgias and neck pain.  Skin: Negative.  Negative for itching and rash.  Neurological: Negative.  Negative for dizziness, tingling, tremors, sensory change, speech change, focal weakness, weakness and headaches.  Endo/Heme/Allergies: Negative.  Negative for environmental allergies. Does not bruise/bleed easily.    Blood pressure 122/83, pulse 83, temperature 98.1 F (36.7 C), temperature source Oral, resp. rate 18, height 5\' 1"  (1.549 m), weight 48.1 kg (106 lb), last menstrual period 03/29/2015, SpO2 100 %.Body mass index is 20.03 kg/m.  General Appearance: Casual  Eye Contact:  Fair  Speech:  Pressured  Volume:  Normal  Mood:  Anxious and Dysphoric  Affect:  Anxious  Thought Process:  Irrelevant  Orientation:  Full (Time, Place, and Person)  Thought Content:  Delusions, Paranoid Ideation and Tangential  Suicidal Thoughts:  No  Homicidal Thoughts:  No  Memory:  Immediate;   Fair Recent;   Fair Remote;   Fair  Judgement:  Impaired  Insight:  Lacking  Psychomotor Activity:  Decreased  Concentration:  Concentration: Poor and Attention Span: Poor  Recall:  [employer] of Knowledge:  Fair  Language:  Fair  Akathisia:  No  Handed:  Right  AIMS (if indicated):     Assets:  Communication Skills Housing  ADL's:  Intact  Cognition:  WNL  Sleep:  Number of Hours: 6     Treatment Plan Summary: Daily contact with patient to assess and evaluate symptoms and progress in treatment and Medication management    Mr. Heinbaugh is a 55 year old female with a history of psychosis  admitted for exacerbation of psychotic symptoms in the context of medication noncompliance.  1. Psychosis. The patient in the past responded well to Risperdal. She was restarted on Risperdal with two of the 1mg  tablets being given BID.  She cannot swallow the 2mg  tablets  2. Depression: She was offered Celexa 20mg  po daily for depression and anxiety but has been refusing the medication because she feels it may harm her unborn child.  3. History of thyroid cancer. We will continue Synthroid.  4. Metabolic syndrome monitoring. Lipid profile, TSH, and hemoglobin A1c were checked in July.  5. Muscle spasms. She is on Flexeril as needed.  6. Constipation. She is on Colace.  7. Hypertension. The patient has elevated blood pressure and tachycardia.  She was started on Metoprolol  8. Disposition. She will return to her apartment. She will follow up with Dr. Einar Grad.    Jay Schlichter, MD 02/21/2016, 4:20 PM

## 2016-02-21 NOTE — Plan of Care (Signed)
Problem: Health Behavior/Discharge Planning: Goal: Compliance with prescribed medication regimen will improve Outcome: Not Progressing Pt refuses prescribed medications and/or  is not consistently agreeable to taking them.

## 2016-02-21 NOTE — Progress Notes (Signed)
Pt denies SI/HI/AVH. Staff reports hearing her talking to self in her room. Refused nighttime Flexeril. Still requesting Vic's Vaporub for congestion because she states it is "chemical free".  Psychologist, clinical at [facility] requesting a blood draw to check her TSH levels and "T4 and T3" She stated that " I don't think I am on the right dose of Synthroid". Pt remains guarded and suspicious and delusional . Easily redirected when needed. Restless. Safety maintained. Encouragement and support provided. Will continue to monitor.

## 2016-02-22 LAB — PROLACTIN: PROLACTIN: 131.2 ng/mL — AB (ref 4.8–23.3)

## 2016-02-22 MED ORDER — RISPERIDONE 1 MG/ML PO SOLN
2.0000 mg | Freq: Two times a day (BID) | ORAL | Status: DC
  Administered 2016-02-23 – 2016-02-25 (×5): 2 mg via ORAL
  Filled 2016-02-22 (×6): qty 2

## 2016-02-22 NOTE — Progress Notes (Signed)
D: Patient is alert and oriented on the unit this shift. Patient attended   in groups today. Patient denies suicidal ideation, homicidal ideation, auditory or visual hallucinations at the present time.  A: Scheduled medications are administered to patient as per MD orders. Emotional support and encouragement are provided. Patient is maintained on q.15 minute safety checks. Patient is informed to notify staff with questions or concerns. R: No adverse medication reactions are noted. Patient is cooperative with some medication administration and treatment plan today. Patient is receptive, calm and cooperative but paranoid, phobic on the unit at this time. Patient doe s not  Interact   on the unit this shift. Patient contracts for safety at this time. Patient remains safe at this time.anxiety 6/10,depression 4/10

## 2016-02-22 NOTE — Plan of Care (Signed)
Problem: Coping: Goal: Ability to verbalize frustrations and anger appropriately will improve Outcome: Not Progressing Pt not able to verbalize frustration at this time ,paranoid, disorganized thinking [place] RN

## 2016-02-22 NOTE — Progress Notes (Signed)
[facility] MD Progress Note  02/22/2016 9:48 AM Diamond Lucas  MRN:  [id number] Subjective:    Diamond Lucas complains bitterly about number of pills she has to take every morning. We went over her list. She wants to keep all her vitamins and supplements but opposes Metoprolol, as she believes it interferes with her thyroid medications, and was started for tachycardia, Celexa as she believes she is pregnant, and Risperdal. She has been cheeking Risperdal here but reportedly she took it today. She denies any symptoms of depression, anxiety or psychosis but still believes in leprechauns and insect infestation at her apartment. She is pleasant and polite. She has slightly more insight into her problems and does not try to talk about her delusions as forcefully. She participates in programming.   Principal Problem: Bipolar I disorder, most recent episode manic, severe with psychotic features ([place]) Diagnosis:   Patient Active Problem List   Diagnosis Date Noted  . Delusional disorder ([place]) [F22] 02/17/2016  . Bipolar I disorder, most recent episode manic, severe with psychotic features ([place]) [F31.2] 02/17/2016  . Delusional disorder, somatic type ([place]) [F22] 10/07/2015  . HTN (hypertension) [I10] 10/07/2015  . Noncompliance [Z91.19] 10/06/2015  . Personal history of malignant neoplasm of thyroid [Z85.850] 02/09/2015  . Rosacea [L71.9] 02/09/2015  . Rhinitis, allergic [J30.9] 12/26/2014  . 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 H/O thyroidectomy [E89.0] 05/23/2014  . 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 Reflux [K21.9] 09/26/2012   Total Time spent with patient: 20 minutes  Past Psychiatric History: Bipolar disorder.   Past Medical History:  Past Medical History:  Diagnosis Date  . Allergy   . Arrhythmia   . History of chicken pox   . History of measles   . History of syncope   . Osteoarthritis   . Rosacea   .  Schizophrenia ([place])   . Thyroid disease     Past Surgical History:  Procedure Laterality Date  . FOOT SURGERY Right   . THYROIDECTOMY     Family History:  Family History  Problem Relation Age of Onset  . Depression Mother   . Cataracts Mother   . Cancer Other   . Heart failure Other   . Heart disease Other   . Stroke Other   . Diabetes Other   . Kidney cancer Father   . Anxiety disorder Brother    Family Psychiatric  History: none  Social History:  History  Alcohol Use No     History  Drug Use No    Social History   Social History  . Marital status: Single    Spouse name: N/A  . Number of children: N/A  . Years of education: N/A   Social History Main Topics  . Smoking status: Never Smoker  . Smokeless tobacco: Never Used  . Alcohol use No  . Drug use: No  . Sexual activity: Not Currently    Birth control/ protection: Post-menopausal   Other Topics Concern  . None   Social History Narrative  . None   Additional Social History:      Sleep: Fair  Appetite:  Fair  Current Medications: Current Facility-Administered Medications  Medication Dose Route Frequency Provider Last Rate Last Dose  . acetaminophen (TYLENOL) tablet 650 mg  650 mg Oral Q6H PRN Jolanta B Pucilowska, MD      . alum & mag hydroxide-simeth (MAALOX/MYLANTA) 200-200-20 MG/5ML suspension 30 mL  30 mL Oral Q4H PRN Clovis Fredrickson, MD      .  calcium-vitamin D (OSCAL WITH D) 500-200 MG-UNIT per tablet 1 tablet  1 tablet Oral BID Clovis Fredrickson, MD   1 tablet at 02/22/16 0807  . cyclobenzaprine (FLEXERIL) tablet 5 mg  5 mg Oral QHS Jolanta B Pucilowska, MD      . cycloSPORINE (RESTASIS) 0.05 % ophthalmic emulsion 1 drop  1 drop Both Eyes BID Clovis Fredrickson, MD   1 drop at 02/22/16 0807  . docusate sodium (COLACE) capsule 100 mg  100 mg Oral Daily Clovis Fredrickson, MD   100 mg at 02/22/16 0807  . fluticasone (FLONASE) 50 MCG/ACT nasal spray 1 spray  1 spray Each Nare Daily  Jolanta B Pucilowska, MD   1 spray at 02/22/16 0800  . ibuprofen (ADVIL,MOTRIN) tablet 600 mg  600 mg Oral Q6H PRN Jolanta B Pucilowska, MD      . levothyroxine (SYNTHROID, LEVOTHROID) tablet 75 mcg  75 mcg Oral QAC breakfast Clovis Fredrickson, MD   75 mcg at 02/22/16 0639  . magnesium hydroxide (MILK OF MAGNESIA) suspension 30 mL  30 mL Oral Daily PRN Jolanta B Pucilowska, MD      . multivitamin with minerals tablet 1 tablet  1 tablet Oral Daily Clovis Fredrickson, MD   1 tablet at 02/22/16 0807  . pseudoephedrine (SUDAFED) 12 hr tablet 120 mg  120 mg Oral Q12H PRN Chauncey Mann, MD      . risperiDONE (RISPERDAL) tablet 2 mg  2 mg Oral BID Chauncey Mann, MD   2 mg at 02/22/16 [id number]    Lab Results:  Results for orders placed or performed during the hospital encounter of 02/17/16 (from the past 48 hour(s))  Prolactin     Status: Abnormal   Collection Time: 02/21/16  6:56 AM  Result Value Ref Range   Prolactin 131.2 (H) 4.8 - 23.3 ng/mL    Comment: (NOTE) Performed At: [facility] [place], [place] [id number] Lindon Romp MD [id number]     Blood Alcohol level:  Lab Results  Component Value Date   [facility] <5 [id number]    Metabolic Disorder Labs: Lab Results  Component Value Date   HGBA1C 5.0 10/07/2015   Lab Results  Component Value Date   PROLACTIN 131.2 (H) 02/21/2016   PROLACTIN 10.7 10/07/2015   Lab Results  Component Value Date   CHOL 275 (H) 10/07/2015   TRIG 106 10/07/2015   HDL 90 10/07/2015   CHOLHDL 3.1 10/07/2015   VLDL 21 10/07/2015   LDLCALC 164 (H) 10/07/2015     Musculoskeletal: Strength & Muscle Tone: within normal limits Gait & Station: normal Patient leans: N/A  Psychiatric Specialty Exam: Physical Exam  Nursing note and vitals reviewed.   Review of Systems  Psychiatric/Behavioral: Positive for depression and hallucinations. The patient is nervous/anxious and has insomnia.     Blood pressure 118/69, pulse 80,  temperature 97.9 F (36.6 C), temperature source Oral, resp. rate 18, height 5\' 1"  (1.549 m), weight 48.1 kg (106 lb), last menstrual period 03/29/2015, SpO2 100 %.Body mass index is 20.03 kg/m.  General Appearance: Casual  Eye Contact:  Fair  Speech:  Clear and Coherent  Volume:  Normal  Mood:  Anxious and Dysphoric  Affect:  Anxious  Thought Process:  Irrelevant  Orientation:  Full (Time, Place, and Person)  Thought Content:  Delusions, Paranoid Ideation and Tangential  Suicidal Thoughts:  No  Homicidal Thoughts:  No  Memory:  Immediate;   Fair Recent;   Fair  Remote;   Fair  Judgement:  Impaired  Insight:  Lacking  Psychomotor Activity:  Decreased  Concentration:  Concentration: Poor and Attention Span: Poor  Recall:  [employer] of Knowledge:  Fair  Language:  Fair  Akathisia:  No  Handed:  Right  AIMS (if indicated):     Assets:  Agricultural consultant Housing Physical Health Resilience  ADL's:  Intact  Cognition:  WNL  Sleep:  Number of Hours: 5     Treatment Plan Summary: Daily contact with patient to assess and evaluate symptoms and progress in treatment and Medication management   Mr. Guadagnoli is a 55 year old female with a history of psychosis admitted for exacerbation of psychotic symptoms in the context of medication noncompliance.  1. Psychosis. The patient in the past responded well to Risperdal. She was restarted on Risperdal but has been cheeking medications. Since she is unable to swallow pills, I will change it to liquid.  2. Depression. She was started on Celexa for depression and anxiety over the weekend but refuses to take due to delusions of pregnancy. I will discontinue.  3. History of thyroid cancer. We will continue Synthroid. She requests that thyroid function tests are rechecked.  4. Metabolic syndrome monitoring. Lipid profile, TSH, and hemoglobin A1c were checked in July. PRL 131.  5. Muscle spasms. She is  on Flexeril as needed.  6. Constipation. She is on Colace.  7. Hypertension. The patient had elevated blood pressure and tachycardia.  She was started on Metoprolol but refuses to continue.  8. Disposition. She will return to her apartment. She will follow up with Dr. Einar Grad.    Orson Slick, MD 02/22/2016, 9:48 AM

## 2016-02-22 NOTE — Progress Notes (Signed)
PT expressed desire to be discharged. PT was pleasant and conversational. Talked briefly about past trauma and hurt surrounding pregnancy. Conversation was odd. [place] will follow-up as needed.

## 2016-02-22 NOTE — Progress Notes (Signed)
Patient pleasant and cooperative with care. Pt refused metoprolol and citaprolam this am, but took all other medications. Pt denies SI, HI, AVH. Pt suspicious of certain medications, reports It has "awful side effects.' Patient does attend group and has minimal interaction with staff and peers. Encouragement and support offered. Medications administered as prescribed. Pt receptive and remains safe on unit with q 15 min checks.

## 2016-02-22 NOTE — Plan of Care (Signed)
Problem: Education: Goal: Mental status will improve Outcome: Progressing Patient taking psych medications, able to state what medications are for

## 2016-02-23 MED ORDER — SODIUM CHLORIDE FLUSH 0.9 % IV SOLN
INTRAVENOUS | Status: AC
  Administered 2016-02-23: 14:00:00
  Filled 2016-02-23: qty 3

## 2016-02-23 NOTE — Plan of Care (Signed)
Problem: Safety: Goal: Ability to remain free from injury will improve Outcome: Progressing Patient has remained free from injury during this shift.   

## 2016-02-23 NOTE — B[facility] Group Notes (Signed)
[place] LCSW Group Therapy Note  Date/Time: 02/23/2016, 3:00pm  Type of Therapy/Topic:  Group Therapy:  Feelings about Diagnosis  Participation Level:  Active   Mood: Reports Good Mood   Description of Group:    This group will allow patients to explore their thoughts and feelings about diagnoses they have received. Patients will be guided to explore their level of understanding and acceptance of these diagnoses. Facilitator will encourage patients to process their thoughts and feelings about the reactions of others to their diagnosis, and will guide patients in identifying ways to discuss their diagnosis with significant others in their lives. This group will be process-oriented, with patients participating in exploration of their own experiences as well as giving and receiving support and challenge from other group members.   Therapeutic Goals: 1. Patient will demonstrate understanding of diagnosis as evidence by identifying two or more symptoms of the disorder:  2. Patient will be able to express two feelings regarding the diagnosis 3. Patient will demonstrate ability to communicate their needs through discussion and/or role plays  Summary of Patient Progress:  Pt unable to meet therapeutic goals as she has minimal insight into her diagnosis and verbalizes that she believes some of her symptoms could be attributed to poor eyesight and food allergies.  CSW and group discussed that lack of insight is also a symptom and she was willing to accept that this could be a problem for her.   Therapeutic Modalities:   Cognitive Behavioral Therapy Brief Therapy Feelings Identification   Dossie Arbour, MSW, LCSW

## 2016-02-23 NOTE — Progress Notes (Signed)
Recreation Therapy Notes  Date: 11.28.17 Time: 9:30 am Location: Craft Room  Group Topic: Self-expression  Goal Area(s) Addresses:  Patient will identify one color per emotion listed on the wheel. Patient will verbalize one emotion experienced during session. Patient will be educated on other forms of self-expression  Behavioral Response: Attentive, Interactive  Intervention: Emotion Wheel  Activity: Patients were given an Emotion Wheel worksheet and were instructed to pick a color for each emotion listed on the wheel.  Education: LRT educated patients on different forms of self-expression.  Education Outcome: In group clarification offered  Clinical Observations/Feedback: Patient picked a color for each emotion listed on the wheel. Patient contributed to group discussion by stating some colors she picked for certain emotions. At the end of group patient stated the paper was old and had lice on it.  Leonette Monarch, LRT/CTRS 02/23/2016 10:24 AM

## 2016-02-23 NOTE — Tx Team (Signed)
Interdisciplinary Treatment and Diagnostic Plan Update  02/23/2016 Time of Session: 10:30am Diamond Lucas MRN: [id number]  Principal Diagnosis: Bipolar I disorder, most recent episode manic, severe with psychotic features ([place])  Secondary Diagnoses: Principal Problem:   Bipolar I disorder, most recent episode manic, severe with psychotic features ([place]) Active Problems:   Hypothyroidism, postop   Noncompliance   Delusional disorder, somatic type ([place])   HTN (hypertension)   Delusional disorder ([place])   Current Medications:  Current Facility-Administered Medications  Medication Dose Route Frequency Provider Last Rate Last Dose  . acetaminophen (TYLENOL) tablet 650 mg  650 mg Oral Q6H PRN Jolanta B Pucilowska, MD      . alum & mag hydroxide-simeth (MAALOX/MYLANTA) 200-200-20 MG/5ML suspension 30 mL  30 mL Oral Q4H PRN Jolanta B Pucilowska, MD      . calcium-vitamin D (OSCAL WITH D) 500-200 MG-UNIT per tablet 1 tablet  1 tablet Oral BID Clovis Fredrickson, MD   1 tablet at 02/23/16 0851  . cyclobenzaprine (FLEXERIL) tablet 5 mg  5 mg Oral QHS Jolanta B Pucilowska, MD      . cycloSPORINE (RESTASIS) 0.05 % ophthalmic emulsion 1 drop  1 drop Both Eyes BID Clovis Fredrickson, MD   1 drop at 02/23/16 0851  . docusate sodium (COLACE) capsule 100 mg  100 mg Oral Daily Jolanta B Pucilowska, MD   100 mg at 02/23/16 0851  . fluticasone (FLONASE) 50 MCG/ACT nasal spray 1 spray  1 spray Each Nare Daily Clovis Fredrickson, MD   1 spray at 02/23/16 0851  . ibuprofen (ADVIL,MOTRIN) tablet 600 mg  600 mg Oral Q6H PRN Jolanta B Pucilowska, MD      . levothyroxine (SYNTHROID, LEVOTHROID) tablet 75 mcg  75 mcg Oral QAC breakfast Clovis Fredrickson, MD   75 mcg at 02/23/16 0635  . magnesium hydroxide (MILK OF MAGNESIA) suspension 30 mL  30 mL Oral Daily PRN Jolanta B Pucilowska, MD      . multivitamin with minerals tablet 1 tablet  1 tablet Oral Daily Clovis Fredrickson, MD   1 tablet at 02/23/16  0851  . pseudoephedrine (SUDAFED) 12 hr tablet 120 mg  120 mg Oral Q12H PRN Chauncey Mann, MD      . risperiDONE (RISPERDAL) 1 MG/ML oral solution 2 mg  2 mg Oral BID Clovis Fredrickson, MD   2 mg at 02/23/16 0851   PTA Medications: Prescriptions Prior to Admission  Medication Sig Dispense Refill Last Dose  . Calcium Carbonate-Vit D-Min (CALCIUM 600+D PLUS MINERALS) 600-400 MG-UNIT TABS Take by mouth.   Taking  . cyclobenzaprine (FLEXERIL) 5 MG tablet Take 1 tablet (5 mg total) by mouth at bedtime. 30 tablet 0 Taking  . cycloSPORINE (RESTASIS) 0.05 % ophthalmic emulsion Administer 1 drop to both eyes every twelve (12) hours.   Taking  . ibuprofen (ADVIL,MOTRIN) 600 MG tablet Take 1 tablet (600 mg total) by mouth every 8 (eight) hours as needed. 30 tablet 0 Taking  . levothyroxine (LEVOXYL) 75 MCG tablet Take by mouth.   Taking  . mometasone (NASONEX) 50 MCG/ACT nasal spray 1 -2 spray in each nostril daily   Not Taking  . Multiple Vitamin (MULTI-VITAMINS) TABS    Taking  . risperiDONE (RISPERDAL) 2 MG tablet Take 1 tablet (2 mg total) by mouth at bedtime. 30 tablet 2   . triamcinolone cream (KENALOG) 0.1 % Apply 1 application topically 2 (two) times daily. (Patient not taking: Reported on 02/11/2016) 30 g 0 Not  Taking    Patient Stressors: Health problems Medication change or noncompliance  Patient Strengths: Average or above average intelligence Capable of independent living Motivation for treatment/growth  Treatment Modalities: Medication Management, Group therapy, Case management,  1 to 1 session with clinician, Psychoeducation, Recreational therapy.   Physician Treatment Plan for Primary Diagnosis: Bipolar I disorder, most recent episode manic, severe with psychotic features ([place]) Long Term Goal(s): Improvement in symptoms so as ready for discharge NA   Short Term Goals: Ability to identify changes in lifestyle to reduce recurrence of condition will improve Ability to verbalize  feelings will improve Ability to disclose and discuss suicidal ideas Ability to demonstrate self-control will improve Ability to identify and develop effective coping behaviors will improve Ability to maintain clinical measurements within normal limits will improve Compliance with prescribed medications will improve NA  Medication Management: Evaluate patient's response, side effects, and tolerance of medication regimen.  Therapeutic Interventions: 1 to 1 sessions, Unit Group sessions and Medication administration.  Evaluation of Outcomes: Progressing   Physician Treatment Plan for Secondary Diagnosis: Principal Problem:   Bipolar I disorder, most recent episode manic, severe with psychotic features ([place]) Active Problems:   Hypothyroidism, postop   Noncompliance   Delusional disorder, somatic type ([place])   HTN (hypertension)   Delusional disorder ([place])  Long Term Goal(s): Improvement in symptoms so as ready for discharge NA   Short Term Goals: Ability to identify changes in lifestyle to reduce recurrence of condition will improve Ability to verbalize feelings will improve Ability to disclose and discuss suicidal ideas Ability to demonstrate self-control will improve Ability to identify and develop effective coping behaviors will improve Ability to maintain clinical measurements within normal limits will improve Compliance with prescribed medications will improve NA     Medication Management: Evaluate patient's response, side effects, and tolerance of medication regimen.  Therapeutic Interventions: 1 to 1 sessions, Unit Group sessions and Medication administration.  Evaluation of Outcomes: Progressing   RN Treatment Plan for Primary Diagnosis: Bipolar I disorder, most recent episode manic, severe with psychotic features ([place]) Long Term Goal(s): Knowledge of disease and therapeutic regimen to maintain health will improve  Short Term Goals: Ability to verbalize frustration  and anger appropriately will improve, Ability to demonstrate self-control, Ability to participate in decision making will improve, Ability to verbalize feelings will improve, Ability to identify and develop effective coping behaviors will improve and Compliance with prescribed medications will improve  Medication Management: RN will administer medications as ordered by provider, will assess and evaluate patient's response and provide education to patient for prescribed medication. RN will report any adverse and/or side effects to prescribing provider.  Therapeutic Interventions: 1 on 1 counseling sessions, Psychoeducation, Medication administration, Evaluate responses to treatment, Monitor vital signs and CBGs as ordered, Perform/monitor CIWA, COWS, AIMS and Fall Risk screenings as ordered, Perform wound care treatments as ordered.  Evaluation of Outcomes: Progressing   LCSW Treatment Plan for Primary Diagnosis: Bipolar I disorder, most recent episode manic, severe with psychotic features ([place]) Long Term Goal(s): Safe transition to appropriate next level of care at discharge, Engage patient in therapeutic group addressing interpersonal concerns.  Short Term Goals: Engage patient in aftercare planning with referrals and resources, Increase social support, Increase ability to appropriately verbalize feelings, Increase emotional regulation, Facilitate acceptance of mental health diagnosis and concerns and Increase skills for wellness and recovery  Therapeutic Interventions: Assess for all discharge needs, 1 to 1 time with Social worker, Explore available resources and support systems, Assess for  adequacy in community support network, Educate family and significant other(s) on suicide prevention, Complete Psychosocial Assessment, Interpersonal group therapy.  Evaluation of Outcomes: Progressing   Progress in Treatment: Attending groups: Yes. Participating in groups: Yes. Taking medication as  prescribed: Yes. Toleration medication: Yes. Family/Significant other contact made: No, will contact:  pt's family. CSW assessing for appropriate contacts   Patient understands diagnosis: Yes. Discussing patient identified problems/goals with staff: Yes. Medical problems stabilized or resolved: Yes. Denies suicidal/homicidal ideation: Yes. Issues/concerns per patient self-inventory: No. Other: none listed  New problem(s) identified: No, Describe:  none listed  New Short Term/Long Term Goal(s):  Discharge Plan or Barriers:   Reason for Continuation of Hospitalization: Anxiety Delusions  Depression Hallucinations  Estimated Length of Stay: 3-5 days  Attendees: Patient: Diamond Lucas 02/23/2016 10:56 AM  Physician: Dr. Bary Leriche, MD 02/23/2016 10:56 AM  Nursing: Polly Cobia, RN 02/23/2016 10:56 AM  RN Care Manager: 02/23/2016 10:56 AM  Social Worker: Alphonse Guild. Daine Gravel, LCAS 02/23/2016 10:56 AM  Recreational Therapist: Everitt Amber, LRT 02/23/2016 10:56 AM  Other:  02/23/2016 10:56 AM  Other:  02/23/2016 10:56 AM  Other: 02/23/2016 10:56 AM    Scribe for Treatment Team: Alphonse Guild Ules Marsala, LCSWA 02/23/2016

## 2016-02-23 NOTE — BHH Group Notes (Signed)
[facility] Group Notes:  (Nursing/MHT/Case Management/Adjunct)  Date:  02/23/2016  Time:  1:51 PM  Type of Therapy:  Psychoeducational Skills  Participation Level:  Active  Participation Quality:  Appropriate, Attentive and Supportive  Affect:  Appropriate  Cognitive:  Appropriate  Insight:  Improving  Engagement in Group:  Engaged and Supportive  Modes of Intervention:  Discussion and Education  Summary of Progress/Problems:  Charise Killian 02/23/2016, 1:51 PM

## 2016-02-23 NOTE — Progress Notes (Signed)
[facility] MD Progress Note  02/23/2016 2:41 PM Diamond Lucas  MRN:  [id number] Subjective:    [name] met with treatment team today. She is pleasant polite and cooperated. She took her medications last night and this morning including liquid Risperdal given to improve compliance. She is able to have almost sensible conversation about her discharge. She wants to return to her appointment. She is unaware that there may be problems. She did not receive an eviction notice. She indicates that she would continue taking medications at home. We discussed introduction of injectable antipsychotic. The patient did not refuse but is not enthusiastic about it and is afraid of pain from injection. It will be a better choice to give her Invega Sustenna Haldol Decanoate rather than Risperdal injection. S she is still still he believes she is pregnan with a leprechaun but unwilling to talk about. Sleep and appetite are good. She participates in programming.  Principal Problem: Bipolar I disorder, most recent episode manic, severe with psychotic features ([place]) Diagnosis:   Patient Active Problem List   Diagnosis Date Noted  . Delusional disorder ([place]) [F22] 02/17/2016  . Bipolar I disorder, most recent episode manic, severe with psychotic features ([place]) [F31.2] 02/17/2016  . Delusional disorder, somatic type ([place]) [F22] 10/07/2015  . HTN (hypertension) [I10] 10/07/2015  . Noncompliance [Z91.19] 10/06/2015  . Personal history of malignant neoplasm of thyroid [Z85.850] 02/09/2015  . Rosacea [L71.9] 02/09/2015  . Rhinitis, allergic [J30.9] 12/26/2014  . 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 H/O thyroidectomy [E89.0] 05/23/2014  . 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 Reflux [K21.9] 09/26/2012   Total Time spent with patient: 20 minutes  Past Psychiatric History: Bipolar disorder.   Past Medical History:  Past Medical History:   Diagnosis Date  . Allergy   . Arrhythmia   . History of chicken pox   . History of measles   . History of syncope   . Osteoarthritis   . Rosacea   . Schizophrenia ([place])   . Thyroid disease     Past Surgical History:  Procedure Laterality Date  . FOOT SURGERY Right   . THYROIDECTOMY     Family History:  Family History  Problem Relation Age of Onset  . Depression Mother   . Cataracts Mother   . Cancer Other   . Heart failure Other   . Heart disease Other   . Stroke Other   . Diabetes Other   . Kidney cancer Father   . Anxiety disorder Brother    Family Psychiatric  History: none  Social History:  History  Alcohol Use No     History  Drug Use No    Social History   Social History  . Marital status: Single    Spouse name: N/A  . Number of children: N/A  . Years of education: N/A   Social History Main Topics  . Smoking status: Never Smoker  . Smokeless tobacco: Never Used  . Alcohol use No  . Drug use: No  . Sexual activity: Not Currently    Birth control/ protection: Post-menopausal   Other Topics Concern  . None   Social History Narrative  . None   Additional Social History:      Sleep: Fair  Appetite:  Fair  Current Medications: Current Facility-Administered Medications  Medication Dose Route Frequency Provider Last Rate Last Dose  . sodium chloride flush 0.9 % injection           .  acetaminophen (TYLENOL) tablet 650 mg  650 mg Oral Q6H PRN Laelynn Blizzard B Roverto Bodmer, MD      . alum & mag hydroxide-simeth (MAALOX/MYLANTA) 200-200-20 MG/5ML suspension 30 mL  30 mL Oral Q4H PRN Shamar Kracke B Gilles Trimpe, MD      . calcium-vitamin D (OSCAL WITH D) 500-200 MG-UNIT per tablet 1 tablet  1 tablet Oral BID Clovis Fredrickson, MD   1 tablet at 02/23/16 0851  . cyclobenzaprine (FLEXERIL) tablet 5 mg  5 mg Oral QHS Tamla Winkels B Jayshaun Phillips, MD      . cycloSPORINE (RESTASIS) 0.05 % ophthalmic emulsion 1 drop  1 drop Both Eyes BID Clovis Fredrickson, MD   1 drop at  02/23/16 0851  . docusate sodium (COLACE) capsule 100 mg  100 mg Oral Daily Giulianna Rocha B Duane Earnshaw, MD   100 mg at 02/23/16 0851  . fluticasone (FLONASE) 50 MCG/ACT nasal spray 1 spray  1 spray Each Nare Daily Clovis Fredrickson, MD   1 spray at 02/23/16 0851  . ibuprofen (ADVIL,MOTRIN) tablet 600 mg  600 mg Oral Q6H PRN Darcelle Herrada B Terryl Molinelli, MD      . levothyroxine (SYNTHROID, LEVOTHROID) tablet 75 mcg  75 mcg Oral QAC breakfast Clovis Fredrickson, MD   75 mcg at 02/23/16 0635  . magnesium hydroxide (MILK OF MAGNESIA) suspension 30 mL  30 mL Oral Daily PRN Sherina Stammer B Emojean Gertz, MD      . multivitamin with minerals tablet 1 tablet  1 tablet Oral Daily Clovis Fredrickson, MD   1 tablet at 02/23/16 0851  . pseudoephedrine (SUDAFED) 12 hr tablet 120 mg  120 mg Oral Q12H PRN Chauncey Mann, MD      . risperiDONE (RISPERDAL) 1 MG/ML oral solution 2 mg  2 mg Oral BID Clovis Fredrickson, MD   2 mg at 02/23/16 5053    Lab Results:  No results found for this or any previous visit (from the past 48 hour(s)).  Blood Alcohol level:  Lab Results  Component Value Date   ETH <5 97/67/3419    Metabolic Disorder Labs: Lab Results  Component Value Date   HGBA1C 5.0 10/07/2015   Lab Results  Component Value Date   PROLACTIN 131.2 (H) 02/21/2016   PROLACTIN 10.7 10/07/2015   Lab Results  Component Value Date   CHOL 275 (H) 10/07/2015   TRIG 106 10/07/2015   HDL 90 10/07/2015   CHOLHDL 3.1 10/07/2015   VLDL 21 10/07/2015   LDLCALC 164 (H) 10/07/2015     Musculoskeletal: Strength & Muscle Tone: within normal limits Gait & Station: normal Patient leans: N/A  Psychiatric Specialty Exam: Physical Exam  Nursing note and vitals reviewed.   Review of Systems  Psychiatric/Behavioral: Positive for depression and hallucinations. The patient is nervous/anxious and has insomnia.     Blood pressure 122/73, pulse (!) 106, temperature 98.3 F (36.8 C), temperature source Oral, resp. rate 18,  height _0  (1.549 m), weight 48.1 kg (106 lb), last menstrual period 03/29/2015, SpO2 100 %.Body mass index is 20.03 kg/m.  General Appearance: Casual  Eye Contact:  Fair  Speech:  Clear and Coherent  Volume:  Normal  Mood:  Anxious and Dysphoric  Affect:  Anxious  Thought Process:  Irrelevant  Orientation:  Full (Time, Place, and Person)  Thought Content:  Delusions, Paranoid Ideation and Tangential  Suicidal Thoughts:  No  Homicidal Thoughts:  No  Memory:  Immediate;   Fair Recent;   Fair Remote;   Fair  Judgement:  Impaired  Insight:  Lacking  Psychomotor Activity:  Decreased  Concentration:  Concentration: Poor and Attention Span: Poor  Recall:  [employer] of Knowledge:  Fair  Language:  Fair  Akathisia:  No  Handed:  Right  AIMS (if indicated):     Assets:  Agricultural consultant Housing Physical Health Resilience  ADL's:  Intact  Cognition:  WNL  Sleep:  Number of Hours: 6     Treatment Plan Summary: Daily contact with patient to assess and evaluate symptoms and progress in treatment and Medication management   Mr. Cothern is a 55 year old female with a history of psychosis admitted for exacerbation of psychotic symptoms in the context of medication noncompliance.  1. Psychosis. The patient in the past responded well to Risperdal. She was restarted on Risperdal but has been cheeking medications. Since she is unable to swallow pills, I will change it to liquid.  2. Depression. She was started on Celexa for depression and anxiety over the weekend but refuses to take due to delusions of pregnancy. I will discontinue.  3. History of thyroid cancer. We will continue Synthroid. She requests that thyroid function tests are rechecked.  4. Metabolic syndrome monitoring. Lipid profile, TSH, and hemoglobin A1c were checked in July. PRL 131.  5. Muscle spasms. She is on Flexeril as needed.  6. Constipation. She is on Colace.  7.  Hypertension. The patient had elevated blood pressure and tachycardia.  She was started on Metoprolol but refuses to continue.  8. Disposition. She will return to her apartment. Spoke with Dr. Einar Grad was suggested act team. She also doubts that the patient did see a therapist at all.    Orson Slick, MD 02/23/2016, 2:41 PM

## 2016-02-23 NOTE — Progress Notes (Signed)
D: Patient appears worrisome. She denies SI/HI/AVH. Does not believe she needs to be here. Has been non compliant with medication. When asking her to name the medications for this writer none of the medications she named were her psych medications. She has been visible in the milieu. Very pleasant.  A: Medication given with education. Encouragement provided.  R: Patient was compliant with medication. She has been calm and cooperative. Safety maintained with 15 min checks.

## 2016-02-24 MED ORDER — PALIPERIDONE PALMITATE 234 MG/1.5ML IM SUSP
234.0000 mg | INTRAMUSCULAR | Status: DC
  Filled 2016-02-24: qty 1.5

## 2016-02-24 NOTE — Progress Notes (Signed)
Patient continues to think she is being discharged today. Requesting items from locker so she can prepare to leave. Pt still believes she is pregnant. Medication and group compliant. Minimal interaction noted with staff and peers. Encouragement and support offered. Medications given as prescribed. Pt remains safe on unit with q 15 min checks.

## 2016-02-24 NOTE — [facility] Group Notes (Signed)
[facility] Group Notes:  (Nursing/MHT/Case Management/Adjunct)  Date:  02/24/2016  Time:  4:14 PM  Type of Therapy:  Group Therapy  Participation Level:  Minimal  Participation Quality:  Attentive  Affect:  Anxious  Cognitive:  Lacking  Insight:  Lacking  Engagement in Group:  Improving  Modes of Intervention:  Activity  Summary of Progress/Problems:  Diamond Lucas De'Chelle Katye Valek 02/24/2016, 4:14 PM

## 2016-02-24 NOTE — Progress Notes (Signed)
D: Pt denies SI/HI/AVH. Pt is pleasant and cooperative affect is flat and sad, less paranoid, not suspicious of staff  Pt appears less anxious and she is interacting with peers and staff appropriately.  A: Pt was offered support and encouragement. Pt was given scheduled medications. Pt was encouraged to attend groups. Q 15 minute checks were done for safety.  R:Pt attends groups and interacts well with peers and staff. Pt is taking medication. Pt has no complaints.Pt receptive to treatment and safety maintained on unit.

## 2016-02-24 NOTE — Plan of Care (Signed)
Problem: Health Behavior/Discharge Planning: Goal: Compliance with prescribed medication regimen will improve Outcome: Progressing Patient medication compliant

## 2016-02-24 NOTE — Plan of Care (Signed)
Problem: Education: Goal: Verbalization of understanding the information provided will improve Outcome: Progressing Patient verbalized feelings to staff.    

## 2016-02-24 NOTE — Progress Notes (Signed)
[facility] MD Progress Note  02/24/2016 1:51 PM LESHAE EID  MRN:  [id number] Subjective:    Ms. Truchan is very pleasant polite and relaxed today. She has been compliant with liquid Risperdal and has been doing better. She did not talk about her delusions at all. She reports no side effects from medications. She is interested in working with our team. Education officer, museum made the referral [employer]. She does not "loose long-lasting injectable antipsychotic but remembers that when she Is injection in the spring of 2016 prescribed by Dr. Cristy Friedlander her arm was hurting and she was nauseated for a period and going to order Invega injection today in hopes that the patient will not diffuse. She lives a miserable life troubled by her delusions. Excellent group participation. She is not interacting with peers much. No somatic complaints.   Principal Problem: Bipolar I disorder, most recent episode manic, severe with psychotic features ([place]) Diagnosis:   Patient Active Problem List   Diagnosis Date Noted  . Delusional disorder ([place]) [F22] 02/17/2016  . Bipolar I disorder, most recent episode manic, severe with psychotic features ([place]) [F31.2] 02/17/2016  . Delusional disorder, somatic type ([place]) [F22] 10/07/2015  . HTN (hypertension) [I10] 10/07/2015  . Noncompliance [Z91.19] 10/06/2015  . Personal history of malignant neoplasm of thyroid [Z85.850] 02/09/2015  . Rosacea [L71.9] 02/09/2015  . Rhinitis, allergic [J30.9] 12/26/2014  . 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 H/O thyroidectomy [E89.0] 05/23/2014  . 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 Reflux [K21.9] 09/26/2012   Total Time spent with patient: 20 minutes  Past Psychiatric History: Bipolar disorder.   Past Medical History:  Past Medical History:  Diagnosis Date  . Allergy   . Arrhythmia   . History of chicken pox   . History of measles   . History of  syncope   . Osteoarthritis   . Rosacea   . Schizophrenia ([place])   . Thyroid disease     Past Surgical History:  Procedure Laterality Date  . FOOT SURGERY Right   . THYROIDECTOMY     Family History:  Family History  Problem Relation Age of Onset  . Depression Mother   . Cataracts Mother   . Cancer Other   . Heart failure Other   . Heart disease Other   . Stroke Other   . Diabetes Other   . Kidney cancer Father   . Anxiety disorder Brother    Family Psychiatric  History: none  Social History:  History  Alcohol Use No     History  Drug Use No    Social History   Social History  . Marital status: Single    Spouse name: N/A  . Number of children: N/A  . Years of education: N/A   Social History Main Topics  . Smoking status: Never Smoker  . Smokeless tobacco: Never Used  . Alcohol use No  . Drug use: No  . Sexual activity: Not Currently    Birth control/ protection: Post-menopausal   Other Topics Concern  . None   Social History Narrative  . None   Additional Social History:      Sleep: Fair  Appetite:  Fair  Current Medications: Current Facility-Administered Medications  Medication Dose Route Frequency Provider Last Rate Last Dose  . acetaminophen (TYLENOL) tablet 650 mg  650 mg Oral Q6H PRN Jubilee Vivero B Kynedi Profitt, MD      . alum & mag hydroxide-simeth (MAALOX/MYLANTA) 200-200-20 MG/5ML suspension 30 mL  30 mL Oral Q4H PRN Gemma Ruan B Shayleigh Bouldin, MD      . calcium-vitamin D (OSCAL WITH D) 500-200 MG-UNIT per tablet 1 tablet  1 tablet Oral BID Clovis Fredrickson, MD   1 tablet at 02/24/16 0752  . cyclobenzaprine (FLEXERIL) tablet 5 mg  5 mg Oral QHS Julieana Eshleman B Jaymi Tinner, MD      . cycloSPORINE (RESTASIS) 0.05 % ophthalmic emulsion 1 drop  1 drop Both Eyes BID Clovis Fredrickson, MD   1 drop at 02/24/16 0752  . docusate sodium (COLACE) capsule 100 mg  100 mg Oral Daily Jarrod Mcenery B Valencia Kassa, MD   100 mg at 02/24/16 0752  . fluticasone (FLONASE) 50 MCG/ACT  nasal spray 1 spray  1 spray Each Nare Daily Clovis Fredrickson, MD   1 spray at 02/24/16 0754  . ibuprofen (ADVIL,MOTRIN) tablet 600 mg  600 mg Oral Q6H PRN Kimberlea Schlag B Destin Vinsant, MD      . levothyroxine (SYNTHROID, LEVOTHROID) tablet 75 mcg  75 mcg Oral QAC breakfast Clovis Fredrickson, MD   75 mcg at 02/24/16 0752  . magnesium hydroxide (MILK OF MAGNESIA) suspension 30 mL  30 mL Oral Daily PRN Macallister Ashmead B Rakiya Krawczyk, MD      . multivitamin with minerals tablet 1 tablet  1 tablet Oral Daily Clovis Fredrickson, MD   1 tablet at 02/24/16 0752  . pseudoephedrine (SUDAFED) 12 hr tablet 120 mg  120 mg Oral Q12H PRN Chauncey Mann, MD      . risperiDONE (RISPERDAL) 1 MG/ML oral solution 2 mg  2 mg Oral BID Clovis Fredrickson, MD   2 mg at 02/24/16 0753    Lab Results:  No results found for this or any previous visit (from the past 48 hour(s)).  Blood Alcohol level:  Lab Results  Component Value Date   ETH <5 99991111    Metabolic Disorder Labs: Lab Results  Component Value Date   HGBA1C 5.0 10/07/2015   Lab Results  Component Value Date   PROLACTIN 131.2 (H) 02/21/2016   PROLACTIN 10.7 10/07/2015   Lab Results  Component Value Date   CHOL 275 (H) 10/07/2015   TRIG 106 10/07/2015   HDL 90 10/07/2015   CHOLHDL 3.1 10/07/2015   VLDL 21 10/07/2015   LDLCALC 164 (H) 10/07/2015     Musculoskeletal: Strength & Muscle Tone: within normal limits Gait & Station: normal Patient leans: N/A  Psychiatric Specialty Exam: Physical Exam  Nursing note and vitals reviewed.   Review of Systems  Psychiatric/Behavioral: Positive for depression and hallucinations. The patient is nervous/anxious and has insomnia.     Blood pressure (!) 145/80, pulse 83, temperature 98.2 F (36.8 C), resp. rate 18, height 5\' 1"  (1.549 m), weight 48.1 kg (106 lb), last menstrual period 03/29/2015, SpO2 100 %.Body mass index is 20.03 kg/m.  General Appearance: Casual  Eye Contact:  Fair  Speech:  Clear  and Coherent  Volume:  Normal  Mood:  Anxious and Dysphoric  Affect:  Anxious  Thought Process:  Irrelevant  Orientation:  Full (Time, Place, and Person)  Thought Content:  Delusions, Paranoid Ideation and Tangential  Suicidal Thoughts:  No  Homicidal Thoughts:  No  Memory:  Immediate;   Fair Recent;   Fair Remote;   Fair  Judgement:  Impaired  Insight:  Lacking  Psychomotor Activity:  Decreased  Concentration:  Concentration: Poor and Attention Span: Poor  Recall:  [employer] of Knowledge:  Fair  Language:  Fair  Akathisia:  No  Handed:  Right  AIMS (if indicated):     Assets:  Agricultural consultant Housing Physical Health Resilience  ADL's:  Intact  Cognition:  WNL  Sleep:  Number of Hours: 6     Treatment Plan Summary: Daily contact with patient to assess and evaluate symptoms and progress in treatment and Medication management   Mr. Edgett is a 55 year old female with a history of psychosis admitted for exacerbation of psychotic symptoms in the context of medication noncompliance.  1. Psychosis. The patient in the past responded well to Risperdal. She was restarted on liquid Risperdal. I will order Invega sustenna injection.  2. Depression. She was started on Celexa for depression and anxiety over the weekend but refuses to take due to delusions of pregnancy. I will discontinue.  3. History of thyroid cancer. We will continue Synthroid. She requests that thyroid function tests are rechecked.  4. Metabolic syndrome monitoring. Lipid profile, TSH, and hemoglobin A1c were checked in July. PRL 131.  5. Muscle spasms. She is on Flexeril as needed.  6. Constipation. She is on Colace.  7. Hypertension. The patient had elevated blood pressure and tachycardia.  She was started on Metoprolol but refuses to continue.  8. Disposition. She will return to her apartment. Spoke with Dr. Einar Grad was suggested act team. She also doubts that  the patient did see a therapist at all.    Orson Slick, MD 02/24/2016, 1:51 PM

## 2016-02-24 NOTE — BHH Group Notes (Signed)
[place] LCSW Group Therapy  02/24/2016 3:07 PM  Type of Therapy:  Group Therapy  Participation Level:  Minimal  Participation Quality:  Attentive  Affect:  Flat  Cognitive:  Confused and Lacking  Insight:  None  Engagement in Therapy:  Poor  Modes of Intervention:  Activity, Discussion, Education and Support  Summary of Progress/Problems:Emotional Regulation: Patients will identify both negative and positive emotions. They will discuss emotions they have difficulty regulating and how they impact their lives. Patients will be asked to identify healthy coping skills to combat unhealthy reactions to negative emotions.    Shatana Saxton G. [place], [place] 02/24/2016, 3:07 PM

## 2016-02-24 NOTE — Progress Notes (Signed)
Recreation Therapy Notes  Date: 11.29.17 Time: 9:30 am Location: Craft Room  Group Topic: Self-esteem  Goal Area(s) Addresses:  Patient will write at least one positive trait about self. Patient will verbalize benefit of having healthy self-esteem.  Behavioral Response: Attentive  Intervention: I Am  Activity: Patients were given a worksheet with the letter I on it and were instructed to write positive traits about themselves inside the letter.  Education: LRT educated patients on ways to increase their self-esteem.  Education Outcome: In group clarification offered  Clinical Observations/Feedback: Patient wrote positive traits. Patient did not contribute to group discussion.  Leonette Monarch, LRT/CTRS 02/24/2016 10:11 AM

## 2016-02-24 NOTE — BHH Group Notes (Signed)
[place] Group Notes:  (Nursing/MHT/Case Management/Adjunct)  Date:  02/24/2016  Time:  3:56 AM  Type of Therapy:  Group Therapy  Participation Level:  Active  Participation Quality:  Appropriate  Affect:  Flat  Cognitive:  Appropriate  Insight:  Appropriate  Engagement in Group:  Engaged  Modes of Intervention:  Discussion  Summary of Progress/Problems: Pt stated that her goal is to go home. Pt feels as though she is ready and would like to start her Holiday season. When staff asked pt had she talked with her dr. Pt seemed to get irritated and shook her head no.   Jenetta Downer Caeleb Batalla 02/24/2016, 3:56 AM

## 2016-02-25 MED ORDER — RISPERIDONE 2 MG PO TBDP: 2.0000 mg | ORAL_TABLET | Freq: Every day | ORAL | 1 refills | Status: DC

## 2016-02-25 MED ORDER — RISPERIDONE MICROSPHERES 50 MG IM SUSR: 50.0000 mg | INTRAMUSCULAR | 1 refills | Status: DC

## 2016-02-25 MED ORDER — RISPERIDONE 1 MG PO TBDP
2.0000 mg | ORAL_TABLET | Freq: Every day | ORAL | Status: DC
  Filled 2016-02-25: qty 1

## 2016-02-25 MED ORDER — RISPERIDONE MICROSPHERES 50 MG IM SUSR
50.0000 mg | INTRAMUSCULAR | Status: DC
  Administered 2016-02-25: 50 mg via INTRAMUSCULAR
  Filled 2016-02-25: qty 2

## 2016-02-25 NOTE — Progress Notes (Signed)
  [facility] Adult Case Management Discharge Plan :  Will you be returning to the same living situation after discharge:  Yes,  pt will be returning to her home in [place] at discharge At discharge, do you have transportation home?: Yes,  pt will be taking a taxi Do you have the ability to pay for your medications: Yes,  pt will be provided with prescriptions at discharge  Release of information consent forms completed and in the chart;  Patient's signature needed at discharge.  Patient to Follow up at: Follow-up Information    [facility] Follow up.   Why:  Please arrive for your appointment at 1pm on Wednesday December 6th with Dr. Einar Grad for medication management and at 1pm on Tuesday December 12th with [facility] for therapy. Contact information: [place] [facility] Ph: [phone] Fax: [phone]       [name] Follow up.   Why:  You have been referred for a higher level of care for medication management and therapy by the ACTT team and you will be contacted after discharge regarding a possible assessment date for services.  Call Thressa Sheller at ph: [phone] for questions Contact information: [facility] [address] [place], [place] [address] Ph: [phone] Fax: [phone]          Next level of care provider has access to [place] and Suicide Prevention discussed: Yes,  completed with pt  Have you used any form of tobacco in the last 30 days? (Cigarettes, Smokeless Tobacco, Cigars, and/or Pipes): No  Has patient been referred to the Quitline?: N/A patient is not a smoker  Patient has been referred for addiction treatment: N/A  Claudine Mouton 02/25/2016, 1:58 PM

## 2016-02-25 NOTE — Tx Team (Signed)
Interdisciplinary Treatment and Diagnostic Plan Update  02/25/2016 Time of Session: 10:30am Diamond Lucas MRN: [id number]  Principal Diagnosis: Bipolar I disorder, most recent episode manic, severe with psychotic features ([place])  Secondary Diagnoses: Principal Problem:   Bipolar I disorder, most recent episode manic, severe with psychotic features ([place]) Active Problems:   Hypothyroidism, postop   Noncompliance   Delusional disorder, somatic type ([place])   HTN (hypertension)   Delusional disorder ([place])   Current Medications:  Current Facility-Administered Medications  Medication Dose Route Frequency Provider Last Rate Last Dose  . acetaminophen (TYLENOL) tablet 650 mg  650 mg Oral Q6H PRN Jolanta B Pucilowska, MD      . alum & mag hydroxide-simeth (MAALOX/MYLANTA) 200-200-20 MG/5ML suspension 30 mL  30 mL Oral Q4H PRN Jolanta B Pucilowska, MD      . calcium-vitamin D (OSCAL WITH D) 500-200 MG-UNIT per tablet 1 tablet  1 tablet Oral BID Clovis Fredrickson, MD   1 tablet at 02/25/16 0758  . cyclobenzaprine (FLEXERIL) tablet 5 mg  5 mg Oral QHS Jolanta B Pucilowska, MD      . cycloSPORINE (RESTASIS) 0.05 % ophthalmic emulsion 1 drop  1 drop Both Eyes BID Clovis Fredrickson, MD   1 drop at 02/25/16 0758  . docusate sodium (COLACE) capsule 100 mg  100 mg Oral Daily Jolanta B Pucilowska, MD   100 mg at 02/25/16 0758  . fluticasone (FLONASE) 50 MCG/ACT nasal spray 1 spray  1 spray Each Nare Daily Clovis Fredrickson, MD   1 spray at 02/24/16 0754  . ibuprofen (ADVIL,MOTRIN) tablet 600 mg  600 mg Oral Q6H PRN Jolanta B Pucilowska, MD      . levothyroxine (SYNTHROID, LEVOTHROID) tablet 75 mcg  75 mcg Oral QAC breakfast Clovis Fredrickson, MD   75 mcg at 02/25/16 0615  . magnesium hydroxide (MILK OF MAGNESIA) suspension 30 mL  30 mL Oral Daily PRN Jolanta B Pucilowska, MD      . multivitamin with minerals tablet 1 tablet  1 tablet Oral Daily Clovis Fredrickson, MD   1 tablet at 02/25/16  0758  . paliperidone (INVEGA SUSTENNA) injection 234 mg  234 mg Intramuscular Q28 days Jolanta B Pucilowska, MD      . pseudoephedrine (SUDAFED) 12 hr tablet 120 mg  120 mg Oral Q12H PRN Chauncey Mann, MD      . risperiDONE (RISPERDAL) 1 MG/ML oral solution 2 mg  2 mg Oral BID Clovis Fredrickson, MD   2 mg at 02/25/16 0758   PTA Medications: Prescriptions Prior to Admission  Medication Sig Dispense Refill Last Dose  . Calcium Carbonate-Vit D-Min (CALCIUM 600+D PLUS MINERALS) 600-400 MG-UNIT TABS Take by mouth.   Taking  . cyclobenzaprine (FLEXERIL) 5 MG tablet Take 1 tablet (5 mg total) by mouth at bedtime. 30 tablet 0 Taking  . cycloSPORINE (RESTASIS) 0.05 % ophthalmic emulsion Administer 1 drop to both eyes every twelve (12) hours.   Taking  . ibuprofen (ADVIL,MOTRIN) 600 MG tablet Take 1 tablet (600 mg total) by mouth every 8 (eight) hours as needed. 30 tablet 0 Taking  . levothyroxine (LEVOXYL) 75 MCG tablet Take by mouth.   Taking  . mometasone (NASONEX) 50 MCG/ACT nasal spray 1 -2 spray in each nostril daily   Not Taking  . Multiple Vitamin (MULTI-VITAMINS) TABS    Taking  . risperiDONE (RISPERDAL) 2 MG tablet Take 1 tablet (2 mg total) by mouth at bedtime. 30 tablet 2   . triamcinolone  cream (KENALOG) 0.1 % Apply 1 application topically 2 (two) times daily. (Patient not taking: Reported on 02/11/2016) 30 g 0 Not Taking    Patient Stressors: Health problems Medication change or noncompliance  Patient Strengths: Average or above average intelligence Capable of independent living Motivation for treatment/growth  Treatment Modalities: Medication Management, Group therapy, Case management,  1 to 1 session with clinician, Psychoeducation, Recreational therapy.   Physician Treatment Plan for Primary Diagnosis: Bipolar I disorder, most recent episode manic, severe with psychotic features ([place]) Long Term Goal(s): Improvement in symptoms so as ready for discharge NA   Short Term  Goals: Ability to identify changes in lifestyle to reduce recurrence of condition will improve Ability to verbalize feelings will improve Ability to disclose and discuss suicidal ideas Ability to demonstrate self-control will improve Ability to identify and develop effective coping behaviors will improve Ability to maintain clinical measurements within normal limits will improve Compliance with prescribed medications will improve NA  Medication Management: Evaluate patient's response, side effects, and tolerance of medication regimen.  Therapeutic Interventions: 1 to 1 sessions, Unit Group sessions and Medication administration.  Evaluation of Outcomes: Adequate for discharge    Physician Treatment Plan for Secondary Diagnosis: Principal Problem:   Bipolar I disorder, most recent episode manic, severe with psychotic features ([place]) Active Problems:   Hypothyroidism, postop   Noncompliance   Delusional disorder, somatic type ([place])   HTN (hypertension)   Delusional disorder ([place])  Long Term Goal(s): Improvement in symptoms so as ready for discharge NA   Short Term Goals: Ability to identify changes in lifestyle to reduce recurrence of condition will improve Ability to verbalize feelings will improve Ability to disclose and discuss suicidal ideas Ability to demonstrate self-control will improve Ability to identify and develop effective coping behaviors will improve Ability to maintain clinical measurements within normal limits will improve Compliance with prescribed medications will improve NA     Medication Management: Evaluate patient's response, side effects, and tolerance of medication regimen.  Therapeutic Interventions: 1 to 1 sessions, Unit Group sessions and Medication administration.  Evaluation of Outcomes: Adequate for discharge    RN Treatment Plan for Primary Diagnosis: Bipolar I disorder, most recent episode manic, severe with psychotic features ([place]) Long  Term Goal(s): Knowledge of disease and therapeutic regimen to maintain health will improve  Short Term Goals: Ability to verbalize frustration and anger appropriately will improve, Ability to demonstrate self-control, Ability to participate in decision making will improve, Ability to verbalize feelings will improve, Ability to identify and develop effective coping behaviors will improve and Compliance with prescribed medications will improve  Medication Management: RN will administer medications as ordered by provider, will assess and evaluate patient's response and provide education to patient for prescribed medication. RN will report any adverse and/or side effects to prescribing provider.  Therapeutic Interventions: 1 on 1 counseling sessions, Psychoeducation, Medication administration, Evaluate responses to treatment, Monitor vital signs and CBGs as ordered, Perform/monitor CIWA, COWS, AIMS and Fall Risk screenings as ordered, Perform wound care treatments as ordered.  Evaluation of Outcomes: Adequate for discharge   LCSW Treatment Plan for Primary Diagnosis: Bipolar I disorder, most recent episode manic, severe with psychotic features ([place]) Long Term Goal(s): Safe transition to appropriate next level of care at discharge, Engage patient in therapeutic group addressing interpersonal concerns.  Short Term Goals: Engage patient in aftercare planning with referrals and resources, Increase social support, Increase ability to appropriately verbalize feelings, Increase emotional regulation, Facilitate acceptance of mental health diagnosis and concerns  and Increase skills for wellness and recovery  Therapeutic Interventions: Assess for all discharge needs, 1 to 1 time with Social worker, Explore available resources and support systems, Assess for adequacy in community support network, Educate family and significant other(s) on suicide prevention, Complete Psychosocial Assessment, Interpersonal group  therapy.  Evaluation of Outcomes: Adequate for discharge   Progress in Treatment: Attending groups: Yes. Participating in groups: Yes. Taking medication as prescribed: Yes. Toleration medication: Yes. Family/Significant other contact made: No, will contact:  pt's family. CSW assessing for appropriate contacts   Patient understands diagnosis: Yes. Discussing patient identified problems/goals with staff: Yes. Medical problems stabilized or resolved: Yes. Denies suicidal/homicidal ideation: Yes. Issues/concerns per patient self-inventory: No. Other: none listed  New problem(s) identified: No, Describe:  none listed  New Short Term/Long Term Goal(s):  Discharge Plan or Barriers: Pt will Pt will discharge home to [facility] to live and will follow up with [facility] for medication management and therapy and pt has been referred to [facility] to be assessed for a higher level of care for for medication management and therapy.     Reason for Continuation of Hospitalization: Anxiety Delusions  Depression Hallucinations  Estimated Date of discharge: 02/25/16  Attendees: Patient: Diamond Lucas 02/25/2016 10:56 AM  Physician: Dr. Bary Leriche, MD 02/25/2016 10:56 AM  Nursing: Elige Radon, RN 02/25/2016 10:56 AM  RN Care Manager: 02/25/2016 10:56 AM  Social Worker: Alphonse Guild. Daine Gravel, LCAS 02/25/2016 10:56 AM  Recreational Therapist: Everitt Amber, LRT 02/25/2016 10:56 AM  Other:  02/25/2016 10:56 AM  Other:  02/25/2016 10:56 AM  Other: 02/25/2016 10:56 AM    Scribe for Treatment Team: Alphonse Guild Reeta Kuk, LCSWA 02/25/2016

## 2016-02-25 NOTE — Progress Notes (Signed)
Thayer Headings from [employer] team visited. Taxi requested per [employer]. Will discharge pt as soon as taxi arrives. Safety maintained. Will continue to monitor.

## 2016-02-25 NOTE — Progress Notes (Signed)
Recreation Therapy Notes  Date: 11.30.17 Time: 9:30 am Location: Craft Room  Group Topic: Leisure Education  Goal Area(s) Addresses:  Patient will identify activities for each letter of the alphabet. Patient will verbalize ability to integrate positive leisure into life post d/c. Patient will verbalize ability to use leisure as a Technical sales engineer.  Behavioral Response: Attentive, Interactive   Intervention: Leisure Alphabet  Activity: Patients were given a Leisure Air traffic controller and were instructed to write healthy leisure activities for each letter of the alphabet.  Education: LRT educated patients on what they need to participate in leisure.  Education Outcome: In group clarification offered   Clinical Observations/Feedback: Patient wrote healthy leisure activities for each letter of the alphabet. Patient contributed to group discussion by stating some of the healthy leisure activities.   Leonette Monarch, LRT/CTRS 02/25/2016 10:21 AM

## 2016-02-25 NOTE — BHH Group Notes (Signed)
[place] LCSW Group Therapy  02/25/2016 2:00 PM  Type of Therapy:  Group Therapy  Participation Level:  Minimal  Participation Quality:  Attentive and Sharing  Affect:  Flat  Cognitive:  Appropriate and Delusional  Insight:  Limited  Engagement in Therapy:  Improving  Modes of Intervention:  Activity, Discussion, Education, Problem-solving, Reality Testing and Support  Summary of Progress/Problems:Balance in life: Patients will discuss the concept of balance and how it looks and feels to be unbalanced. Pt will identify areas in their life that is unbalanced and ways to become more balanced. They discussed what aspects in their lives has influenced their self care. Patients also discussed self care in the areas of self regulation/control, hygiene/appearance, sleep/relaxation, healthy leisure, healthy eating habits, exercise, inner peace/spirituality, self improvement, sobriety, and health management. They were challenged to identify changes that are needed in order to improve self care. Patient discussed her discharge and stated "I feel like my phone company is a support to me because I feel they keep a paper trail of the resources I search for". CSW provided support to patient and discussed other support options that she can utilize such as her outpatient therapist and psychiatrist.   [name]. [place], [place] 02/25/2016, 3:28 PM

## 2016-02-25 NOTE — BHH Suicide Risk Assessment (Signed)
[facility] Discharge Suicide Risk Assessment   Principal Problem: Bipolar I disorder, most recent episode manic, severe with psychotic features [facility]) Discharge Diagnoses:  Patient Active Problem List   Diagnosis Date Noted  . Delusional disorder ([place]) [F22] 02/17/2016  . Bipolar I disorder, most recent episode manic, severe with psychotic features ([place]) [F31.2] 02/17/2016  . Delusional disorder, somatic type ([place]) [F22] 10/07/2015  . HTN (hypertension) [I10] 10/07/2015  . Noncompliance [Z91.19] 10/06/2015  . Personal history of malignant neoplasm of thyroid [Z85.850] 02/09/2015  . Rosacea [L71.9] 02/09/2015  . Rhinitis, allergic [J30.9] 12/26/2014  . 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 H/O thyroidectomy [E89.0] 05/23/2014  . 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 Reflux [K21.9] 09/26/2012    Total Time spent with patient: 30 minutes  Musculoskeletal: Strength & Muscle Tone: within normal limits Gait & Station: normal Patient leans: N/A  Psychiatric Specialty Exam: Review of Systems  Psychiatric/Behavioral: Positive for hallucinations.  All other systems reviewed and are negative.   Blood pressure 133/84, pulse 74, temperature 98.1 F (36.7 C), temperature source Oral, resp. rate 18, height 5\' 1"  (1.549 m), weight 48.1 kg (106 lb), last menstrual period 03/29/2015, SpO2 100 %.Body mass index is 20.03 kg/m.  General Appearance: Casual  Eye Contact::  Good  Speech:  Clear and Coherent409  Volume:  Normal  Mood:  Euthymic  Affect:  Appropriate  Thought Process:  Goal Directed and Descriptions of Associations: Tangential  Orientation:  Full (Time, Place, and Person)  Thought Content:  Delusions and Paranoid Ideation  Suicidal Thoughts:  No  Homicidal Thoughts:  No  Memory:  Immediate;   Fair Recent;   Fair Remote;   Fair  Judgement:  Poor  Insight:  Lacking  Psychomotor Activity:  Normal   Concentration:  Fair  Recall:  [employer] [place]  Language: Fair  Akathisia:  No  Handed:  Right  AIMS (if indicated):     Assets:  Communication Skills Desire for Improvement Financial Resources/Insurance Housing Physical Health Resilience Social Support  Sleep:  Number of Hours: 6  Cognition: WNL  ADL's:  Intact   Mental Status Per Nursing Assessment::   On Admission:     Demographic Factors:  Caucasian and Living alone  Loss Factors: NA  Historical Factors: Impulsivity  Risk Reduction Factors:   Sense of responsibility to family and Positive social support  Continued Clinical Symptoms:  Bipolar Disorder:   Mixed State Currently Psychotic  Cognitive Features That Contribute To Risk:  None    Suicide Risk:  Minimal: No identifiable suicidal ideation.  Patients presenting with no risk factors but with morbid ruminations; may be classified as minimal risk based on the severity of the depressive symptoms  Follow-up Information    [facility] Follow up.   Why:  Please arrive for your appointment at 1pm on Wednesday December 6th with Dr. Einar Grad for medication management and at 1pm on Tuesday December 12th with Royal Piedra for therapy. Contact information: [facility] [facility] Ph: [phone] Fax: [phone]       [name] Follow up.           Plan Of Care/Follow-up recommendations:  Activity:  as tolerated. Diet:  low sodium heart healthy. Other:  keep follow up appointments.  Orson Slick, MD 02/25/2016, 12:25 PM
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
## 2016-02-25 NOTE — Progress Notes (Signed)
Provided and reviewed discharge paperwork and prescriptions. Verified understanding by use of teach back method. Verbalizes understanding as well. Pt belongings returned to pt as noted. Denies SI/HI/AVH. Pt requests that a taxi be requested to assist in transporting her home at her expense. Will contact Ladona Mow to arrange for taxi to transport pt to her home. Awaiting [facility] team to visit here at the hospital for assessment. Safety maintained. Will discharge thereafter and continue to monitor.

## 2016-02-25 NOTE — Progress Notes (Signed)
D: Patient is alert and oriented on the unit this shift. Patient not attended and actively participated in groups today. Patient denies suicidal ideation, homicidal ideation, auditory or visual hallucinations at the present time.  A: Scheduled medications are administered to patient as per MD orders. Emotional support and encouragement are provided. Patient is maintained on q.15 minute safety checks. Patient is informed to notify staff with questions or concerns. R: No adverse medication reactions are noted. Patient is not cooperative with medication administration  . Patient is receptive, calm and cooperative on the unit at this time. Patient does not interact with others on the unit this shift. Patient contracts for safety at this time. Patient remains safe at this time.patient very delusional thinking she is pregnant and refuses invega injection.Patient wants to speak with her physician before taking the injection
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
## 2016-02-25 NOTE — [facility] Group Notes (Signed)
[facility] Group Notes:  (Nursing/MHT/Case Management/Adjunct)  Date:  02/25/2016  Time:  4:03 PM  Type of Therapy:  Psychoeducational Skills  Participation Level:  Did Not Attend  Charise Killian 02/25/2016, 4:03 PM

## 2016-02-25 NOTE — Plan of Care (Signed)
Problem: Coping: Goal: Ability to verbalize frustrations and anger appropriately will improve Outcome: Progressing Patient able to verbalize frustrations calmly with nurse and physician [place] RN

## 2016-02-25 NOTE — Progress Notes (Signed)
Pt awake, alert, oriented today, up on unit. Does not socialize with peers, but is seen out of room most of the morning. Denies SI/HI/AVH. Medication complaint this morning, however she did refuse IM medication as ordered last night, see MAR. Reports fair sleep last night without sleep medications. Reports good appetite, normal energy, good concentration. Rates depression 3/10, hopelessness 3/10, anxiety 8/10 (low 0-10 high). Continues to be delusional, thinking she is pregnant.   Support and encouragement provided with use of therapeutic communication. Medications administered as ordered with education. Safety maintained with every 15 minute checks. Will continue to monitor.

## 2016-03-02 ENCOUNTER — Encounter: Payer: Self-pay | Admitting: Physician Assistant

## 2016-03-02 ENCOUNTER — Ambulatory Visit (INDEPENDENT_AMBULATORY_CARE_PROVIDER_SITE_OTHER): Payer: Medicare Other | Admitting: Physician Assistant

## 2016-03-02 ENCOUNTER — Ambulatory Visit (INDEPENDENT_AMBULATORY_CARE_PROVIDER_SITE_OTHER): Payer: Medicare Other | Admitting: Psychiatry

## 2016-03-02 ENCOUNTER — Encounter: Payer: Self-pay | Admitting: Psychiatry

## 2016-03-02 VITALS — BP 114/77 | HR 75 | Temp 97.5°F | Wt 112.2 lb

## 2016-03-02 VITALS — BP 100/60 | HR 69 | Temp 97.4°F | Resp 16 | Wt 112.6 lb

## 2016-03-02 DIAGNOSIS — H6121 Impacted cerumen, right ear: Secondary | ICD-10-CM

## 2016-03-02 DIAGNOSIS — H6981 Other specified disorders of Eustachian tube, right ear: Secondary | ICD-10-CM | POA: Diagnosis not present

## 2016-03-02 DIAGNOSIS — F200 Paranoid schizophrenia: Secondary | ICD-10-CM | POA: Diagnosis not present

## 2016-03-02 NOTE — Progress Notes (Signed)
Patient ID: Diamond Lucas, female   DOB: 04/04/1960, 55 y.o.   MRN: [id number]   Psychiatric Progress Note  Patient Identification: Diamond Lucas MRN:  [id number] Date of Evaluation:  03/02/2016 Chief Complaint:  Doing okay Chief Complaint    Follow-up; Medication Refill     Visit Diagnosis:    ICD-9-CM ICD-10-CM   1. Paranoid schizophrenia ([place]) 295.30 F20.0    Diagnosis:   Patient Active Problem List   Diagnosis Date Noted  . Delusional disorder ([place]) [F22] 02/17/2016  . Bipolar I disorder, most recent episode manic, severe with psychotic features ([place]) [F31.2] 02/17/2016  . Delusional disorder, somatic type ([place]) [F22] 10/07/2015  . HTN (hypertension) [I10] 10/07/2015  . Noncompliance [Z91.19] 10/06/2015  . Personal history of malignant neoplasm of thyroid [Z85.850] 02/09/2015  . Rosacea [L71.9] 02/09/2015  . Rhinitis, allergic [J30.9] 12/26/2014  . 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 H/O thyroidectomy [E89.0] 05/23/2014  . 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 Reflux [K21.9] 09/26/2012   History of Present Illness:   Patient is a 55 year old Caucasian woman with a previous diagnosis of paranoid schizophrenia  comes in today for a follow-up visit. Patient was most recently hospitalized at the psychiatric unit here at [facility] And discharged on November 22. Patient had become increasingly disorganized and bizarre prior to her admission. She had called the authorities including [place] several times and believed that there are bugs in her and an ideation man living in her apartment as well. Patient was decompensating and was brought by the police to [facility] crisis center from there she was sent to the emergency room at [facility]. Patient today reports that she was just feeling a little unwell and she was kept at the hospital for 9 days which was not necessary. She presents  with complete lack of insight into what led to her recent hospitalization. During her time in the hospital she was started on Risperdal Consta given her history of noncompliance with medication. Patient is due for another injection on December 10. However patient will probably need an act team as she seems to be getting decompensated on a more frequent basis and will need a case manager to help her as well. We discussed her being transitioned to [facility] so she can have all the services she needs. Patient is in agreement to this plan. Past Medical History:  Past Medical History:  Diagnosis Date  . Allergy   . Arrhythmia   . History of chicken pox   . History of measles   . History of syncope   . Osteoarthritis   . Rosacea   . Schizophrenia ([place])   . Thyroid disease     Past Surgical History:  Procedure Laterality Date  . FOOT SURGERY Right   . THYROIDECTOMY     Family History:  Family History  Problem Relation Age of Onset  . Depression Mother   . Cataracts Mother   . Cancer Other   . Heart failure Other   . Heart disease Other   . Stroke Other   . Diabetes Other   . Kidney cancer Father   . Anxiety disorder Brother    Social History:   Social History   Social History  . Marital status: Single    Spouse name: N/A  . Number of children: N/A  . Years of education: N/A   Social History Main Topics  . Smoking status: Never Smoker  . Smokeless tobacco: Never  Used  . Alcohol use No  . Drug use: No  . Sexual activity: Not Currently    Birth control/ protection: Post-menopausal   Other Topics Concern  . None   Social History Narrative  . None   Additional Social History:   Musculoskeletal: Strength & Muscle Tone: within normal limits Gait & Station: normal Patient leans: N/A  Psychiatric Specialty Exam: Medication Refill     ROS  Blood pressure 114/77, pulse 75, temperature 97.5 F (36.4 C), temperature source Oral, weight 112 lb 3.2 oz (50.9 kg), last menstrual  period 03/29/2015.Body mass index is 21.2 kg/m.  General Appearance: Casual  Eye Contact:  Fair  Speech:  Clear and Coherent  Volume:  Normal  Mood:  Good   Affect:  Congruent  Thought Process:  Circumstantial  Orientation:  Full (Time, Place, and Person)  Thought Content:  Delusions, Ilusions and Paranoid Ideation  Suicidal Thoughts:  No  Homicidal Thoughts:  No  Memory:  Immediate;   Fair Recent;   Fair Remote;   Fair  Judgement:  Limited   Insight:  Lacking  Psychomotor Activity:  Normal  Concentration:  Fair  Recall:  [employer] of Knowledge:Fair  Language: Fair  Akathisia:  No  Handed:  Right  AIMS (if indicated):  na  Assets:  Communication Skills Desire for Improvement Housing  ADL's:  Intact  Cognition: WNL  Sleep:  fair   Is the patient at risk to self?  No. Has the patient been a risk to self in the past 6 months?  No. Has the patient been a risk to self within the distant past?  No. Is the patient a risk to others?  No. Has the patient been a risk to others in the past 6 months?  No. Has the patient been a risk to others within the distant past?  No.  Allergies:   Allergies  Allergen Reactions  . Diclofenac Sodium Palpitations  . Naproxen Itching, Rash and Hives  . Dust Mite Mixed Allergen Ext [Mite (D. Farinae)] Itching    Respiratory illness, and itchy eyes, eye problems  . Nsaids     Other reaction(s): Other (See Comments)  . Other Itching    Respiratory illness, and itchy eyes, eye problems Respiratory illness, and itchy eyes, eye problems   Current Medications: Current Outpatient Prescriptions  Medication Sig Dispense Refill  . Calcium Carbonate-Vit D-Min (CALCIUM 600+D PLUS MINERALS) 600-400 MG-UNIT TABS Take by mouth.    . cyclobenzaprine (FLEXERIL) 5 MG tablet Take 1 tablet (5 mg total) by mouth at bedtime. 30 tablet 0  . cycloSPORINE (RESTASIS) 0.05 % ophthalmic emulsion Administer 1 drop to both eyes every twelve (12) hours.    Marland Kitchen  ibuprofen (ADVIL,MOTRIN) 600 MG tablet Take 1 tablet (600 mg total) by mouth every 8 (eight) hours as needed. 30 tablet 0  . levothyroxine (LEVOXYL) 75 MCG tablet Take by mouth.    . mometasone (NASONEX) 50 MCG/ACT nasal spray 1 -2 spray in each nostril daily    . Multiple Vitamin (MULTI-VITAMINS) TABS     . risperiDONE (RISPERDAL M-TABS) 2 MG disintegrating tablet Take 1 tablet (2 mg total) by mouth at bedtime. 30 tablet 1  . risperiDONE microspheres (RISPERDAL CONSTA) 50 MG injection Inject 2 mLs (50 mg total) into the muscle every 14 (fourteen) days. 1 each 1  . triamcinolone cream (KENALOG) 0.1 % Apply 1 application topically 2 (two) times daily. 30 g 0   No current facility-administered medications for this visit.  Previous Psychotropic Medications: Yes   Substance Abuse History in the last 12 months:  No.  Consequences of Substance Abuse: Negative  Medical Decision Making:  Established Problem, Stable/Improving (1), Review of Psycho-Social Stressors (1), Review and summation of old records (2), Review of Medication Regimen & Side Effects (2) and Review of New Medication or Change in Dosage (2)  Treatment Plan Summary: Medication management   Delusional disorder/Schizophrenia Continue Risperdal Consta injection and next injection is due on December 10. Discussed with patient that her needs would be better met at [facility] since they have a range of services including the injection management and case management services. It is also possible that patient will need ACTT services since she has been decompensating frequently over the past 6 months. Patient presenting with poor insight and judgment and will benefit from having a case Freight forwarder. Patient will transition care to [place] and will present as a walk in appointment over the next 2-3 days. She has signed a release and her records, transferred to [facility].    Shemeka Wardle 12/6/20171:18 PM

## 2016-03-02 NOTE — Progress Notes (Signed)
Patient: Diamond Lucas Female    DOB: 1960-07-26   55 y.o.   MRN: [id number] Visit Date: 03/02/2016  Today's Provider: Mar Daring, PA-C   Chief Complaint  Patient presents with  . Hospitalization Follow-up   Subjective:    HPI  Follow up Hospitalization  Patient was admitted to [facility] on 02/16/16 and discharged on Nov 30. She was treated for psychosis. Treatment for this included Risperdal injection.  Patient called for an appointment concerned about having chocolate in her ears and to see Tawanna Sat because she was admitted at [facility]. She reports that her right ear hurts and thinks it has wax build up. She also reports that she took something that looked like chocolate and wants an ENT referral.   She has been compliant with her appointments and follow ups. She still has some delusional ideations, but is much improved. Still has concern of tapeworm in stool, but thinks it may have come out. She brought in toilet water in 2 ziploc bags today for me to look at. Looked like old toilet water with residual fecal material and toilet paper. Patient advised no worms noted in the water.  ------------------------------------------------------------------------------------       Allergies  Allergen Reactions  . Diclofenac Sodium Palpitations  . Naproxen Itching, Rash and Hives  . Dust Mite Mixed Allergen Ext [Mite (D. Farinae)] Itching    Respiratory illness, and itchy eyes, eye problems  . Nsaids     Other reaction(s): Other (See Comments)  . Other Itching    Respiratory illness, and itchy eyes, eye problems Respiratory illness, and itchy eyes, eye problems     Current Outpatient Prescriptions:  .  Calcium Carbonate-Vit D-Min (CALCIUM 600+D PLUS MINERALS) 600-400 MG-UNIT TABS, Take by mouth., Disp: , Rfl:  .  cyclobenzaprine (FLEXERIL) 5 MG tablet, Take 1 tablet (5 mg total) by mouth at bedtime., Disp: 30 tablet, Rfl: 0 .  cycloSPORINE (RESTASIS) 0.05 % ophthalmic  emulsion, Administer 1 drop to both eyes every twelve (12) hours., Disp: , Rfl:  .  ibuprofen (ADVIL,MOTRIN) 600 MG tablet, Take 1 tablet (600 mg total) by mouth every 8 (eight) hours as needed., Disp: 30 tablet, Rfl: 0 .  levothyroxine (LEVOXYL) 75 MCG tablet, Take by mouth., Disp: , Rfl:  .  mometasone (NASONEX) 50 MCG/ACT nasal spray, 1 -2 spray in each nostril daily, Disp: , Rfl:  .  Multiple Vitamin (MULTI-VITAMINS) TABS, , Disp: , Rfl:  .  risperiDONE microspheres (RISPERDAL CONSTA) 50 MG injection, Inject 2 mLs (50 mg total) into the muscle every 14 (fourteen) days., Disp: 1 each, Rfl: 1 .  triamcinolone cream (KENALOG) 0.1 %, Apply 1 application topically 2 (two) times daily., Disp: 30 g, Rfl: 0 .  risperiDONE (RISPERDAL M-TABS) 2 MG disintegrating tablet, Take 1 tablet (2 mg total) by mouth at bedtime., Disp: 30 tablet, Rfl: 1  Review of Systems  Constitutional: Negative.   HENT: Positive for ear pain. Negative for ear discharge, postnasal drip, rhinorrhea, sinus pain, sinus pressure, sneezing, sore throat, tinnitus and trouble swallowing.   Respiratory: Negative for cough, chest tightness, shortness of breath and wheezing.   Cardiovascular: Negative for chest pain, palpitations and leg swelling.  Gastrointestinal: Negative for abdominal pain, constipation, diarrhea and nausea.    Social History  Substance Use Topics  . Smoking status: Never Smoker  . Smokeless tobacco: Never Used  . Alcohol use No   Objective:   BP 100/60 (BP Location: Left Arm, Patient Position:  Sitting, Cuff Size: Normal)   Pulse 69   Temp 97.4 F (36.3 C) (Oral)   Resp 16   Wt 112 lb 9.6 oz (51.1 kg)   LMP 03/29/2015   BMI 21.28 kg/m   Physical Exam  Constitutional: She appears well-developed and well-nourished. No distress.  HENT:  Head: Normocephalic and atraumatic.  Right Ear: Hearing, external ear and ear canal normal. Tympanic membrane is erythematous. Tympanic membrane is not bulging. A middle  ear effusion is present.  Left Ear: Hearing, tympanic membrane, external ear and ear canal normal.  Nose: Nose normal.  Mouth/Throat: Uvula is midline, oropharynx is clear and moist and mucous membranes are normal. No oropharyngeal exudate.  Right external canal did have cerumen which was successfully lavaged. Upon evaluation after lavage the external canal was erythematous from cleaning and the right TM was visualized. It was hard to determine if she may have just ETD with some serous fluid effusion behind the TM (possible air bubbles) vs perforation. I do not believe it is perforated, but was unable to fully visualize.   Eyes: Conjunctivae are normal. Pupils are equal, round, and reactive to light. Right eye exhibits no discharge. Left eye exhibits no discharge. No scleral icterus.  Neck: Normal range of motion. Neck supple. No tracheal deviation present. No thyromegaly present.  Cardiovascular: Normal rate, regular rhythm and normal heart sounds.  Exam reveals no gallop and no friction rub.   No murmur heard. Pulmonary/Chest: Effort normal and breath sounds normal. No stridor. No respiratory distress. She has no wheezes. She has no rales.  Lymphadenopathy:    She has no cervical adenopathy.  Skin: Skin is warm and dry. She is not diaphoretic.  Vitals reviewed.       Assessment & Plan:     1. Impacted cerumen of right ear Successfully lavaged.  - Ear Lavage  2. Dysfunction of right eustachian tube Advised for patient to use Flonase 2 sprays per nostril. Directed for her to point her nose to her toes but then angled the spray toward the ear. I also referred her to ENT for further evaluation of the right tympanic membrane to make sure that it is only eustachian tube dysfunction and not a perforation. - Ambulatory referral to ENT       Mar Daring, PA-C  [place]

## 2016-03-07 ENCOUNTER — Telehealth: Payer: Self-pay

## 2016-03-07 NOTE — Telephone Encounter (Signed)
pt called left message that she can not afford the medication and she would like to speak with you about that.  she states she can not afford the injection you wanted her to do.   I tried to call the patient back but no answer.

## 2016-03-08 ENCOUNTER — Ambulatory Visit (INDEPENDENT_AMBULATORY_CARE_PROVIDER_SITE_OTHER): Payer: Medicare Other | Admitting: Licensed Clinical Social Worker

## 2016-03-08 DIAGNOSIS — F200 Paranoid schizophrenia: Secondary | ICD-10-CM | POA: Diagnosis not present

## 2016-03-08 NOTE — Telephone Encounter (Signed)
Patient will need to follow up at [facility] since they have the resources to help this patient with her medications with case management and she needs more intensive care than our clinic and provide please follow-up with [facility].

## 2016-03-10 ENCOUNTER — Telehealth: Payer: Self-pay | Admitting: Physician Assistant

## 2016-03-10 NOTE — Telephone Encounter (Signed)
Please review. I don't see any labs that where ordered by you.   Thanks,  -Dawsyn Zurn

## 2016-03-10 NOTE — Telephone Encounter (Signed)
Pt calling wanting to know about her lab work results that was done last week around 03-02-16. Pt states she hasn't heard anything from results and was just wondering if the results have came back. Please call pt  @ [phone].  Thanks CC

## 2016-03-10 NOTE — Progress Notes (Signed)
   THERAPIST PROGRESS NOTE  Session Time: 59min  Participation Level: Active  Behavioral Response: BizarreAlertAnxious  Type of Therapy: Individual Therapy  Treatment Goals addressed: Coping and Diagnosis: Schizophrenia  Interventions: CBT, Motivational Interviewing, Solution Focused and Supportive  Summary: Diamond Lucas is a 55 y.o. female who presents with continued symptoms of her diagnosis.  Patient reports that she was hospitalized a few weeks ago.  Patient reports that she was given several referrals but unsure of what or where to go.  Patient asked Clinician to assist her with mental health treatment.  Patient vented her frustration about getting Risperdone injection instead of taking the pills.  Patient reports that she is unable to afford injection. Patient reports that she attended appointments at [facility] but was also told to make appointments at [facility].  Patient reports that she has contacted both agencies with no success with an appointment.  Patient discussed her finances and her owing the [employer] over $51,000 for overpayment in [place].  Patient reports that she continues to have bats in her home.  Patient denies having/needing exterminator.   Suicidal/Homicidal: No  Therapist Response: Clinician actively listened as Patient discussed her current stressors and frustrations.  Clincian provided Patient with the positives of injections.  Clinician assisted patient with creating a budget.  Clinician encouraged patient to contact the agency who provides her financial support for clarification on funds.  Clincian contacted Thressa Sheller and left message encouraging contact for Patient.  Patient provided verbal consent to contact [facility] for her.  Plan: Return again in 2 weeks.  Diagnosis: Axis I: Paranoid Schizophrenia    Axis II: No diagnosis    Lubertha South, LCSW 03/10/2016

## 2016-03-11 NOTE — Telephone Encounter (Signed)
Talked with patient to remind her we were unable to test sample brought due to it not being sterile.

## 2016-03-14 ENCOUNTER — Encounter: Payer: Self-pay | Admitting: Psychiatry

## 2016-03-14 ENCOUNTER — Ambulatory Visit: Payer: Medicare Other | Admitting: Psychiatry

## 2016-03-14 VITALS — BP 129/85 | HR 89 | Wt 114.0 lb

## 2016-03-14 DIAGNOSIS — F200 Paranoid schizophrenia: Secondary | ICD-10-CM

## 2016-03-14 NOTE — Progress Notes (Signed)
Patient ID: Diamond Lucas, female   DOB: October 26, 1960, 55 y.o.   MRN: [id number]   Psychiatric Progress Note  Patient Identification: Diamond Lucas MRN:  [id number] Date of Evaluation:  03/14/2016 Chief Complaint:  Doing okay Chief Complaint    Follow-up; Medication Refill     Visit Diagnosis:    ICD-9-CM ICD-10-CM   1. Paranoid schizophrenia ([place]) 295.30 F20.0    Diagnosis:   Patient Active Problem List   Diagnosis Date Noted  . Delusional disorder ([place]) [F22] 02/17/2016  . Bipolar I disorder, most recent episode manic, severe with psychotic features ([place]) [F31.2] 02/17/2016  . Delusional disorder, somatic type ([place]) [F22] 10/07/2015  . HTN (hypertension) [I10] 10/07/2015  . Noncompliance [Z91.19] 10/06/2015  . Personal history of malignant neoplasm of thyroid [Z85.850] 02/09/2015  . Rosacea [L71.9] 02/09/2015  . Rhinitis, allergic [J30.9] 12/26/2014  . 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 H/O thyroidectomy [E89.0] 05/23/2014  . 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 Reflux [K21.9] 09/26/2012   History of Present Illness:   Patient is a 55 year old Caucasian woman with a previous diagnosis of paranoid schizophrenia  comes in today Though she was referred to another clinic for wraparound care and better resources. Patient today reports that she has been to both [facility] and [facility] and that she prefers to take the pill form of the medication since she has not committed any crimes. She reports that [employer] had taken over her job search and she would rather not duplicate that. It is not exactly clear what patient is alluding to but she was told that this clinic is not able to accommodate her needs given her recent history of multiple decompensations. Patient reports she has been doing okay and denies any suicidal thoughts or any psychotic symptoms currently.   Past Medical History:  Past Medical  History:  Diagnosis Date  . Allergy   . Arrhythmia   . History of chicken pox   . History of measles   . History of syncope   . Osteoarthritis   . Rosacea   . Schizophrenia ([place])   . Thyroid disease     Past Surgical History:  Procedure Laterality Date  . FOOT SURGERY Right   . THYROIDECTOMY     Family History:  Family History  Problem Relation Age of Onset  . Depression Mother   . Cataracts Mother   . Cancer Other   . Heart failure Other   . Heart disease Other   . Stroke Other   . Diabetes Other   . Kidney cancer Father   . Anxiety disorder Brother    Social History:   Social History   Social History  . Marital status: Single    Spouse name: N/A  . Number of children: N/A  . Years of education: N/A   Social History Main Topics  . Smoking status: Never Smoker  . Smokeless tobacco: Never Used  . Alcohol use No  . Drug use: No  . Sexual activity: Not Currently    Birth control/ protection: Post-menopausal   Other Topics Concern  . None   Social History Narrative  . None   Additional Social History:   Musculoskeletal: Strength & Muscle Tone: within normal limits Gait & Station: normal Patient leans: N/A  Psychiatric Specialty Exam: Medication Refill     ROS  Blood pressure 129/85, pulse 89, weight 114 lb (51.7 kg), last menstrual period 03/29/2015.Body mass index is 21.54 kg/m.  General Appearance: Casual  Eye Contact:  Fair  Speech:  Clear and Coherent  Volume:  Normal  Mood:  Good   Affect:  Congruent  Thought Process:  Circumstantial  Orientation:  Full (Time, Place, and Person)  Thought Content:  Delusions, Ilusions and Paranoid Ideation  Suicidal Thoughts:  No  Homicidal Thoughts:  No  Memory:  Immediate;   Fair Recent;   Fair Remote;   Fair  Judgement:  Limited   Insight:  Lacking  Psychomotor Activity:  Normal  Concentration:  Fair  Recall:  [employer] of Knowledge:Fair  Language: Fair  Akathisia:  No  Handed:  Right  AIMS  (if indicated):  na  Assets:  Communication Skills Desire for Improvement Housing  ADL's:  Intact  Cognition: WNL  Sleep:  fair   Is the patient at risk to self?  No. Has the patient been a risk to self in the past 6 months?  No. Has the patient been a risk to self within the distant past?  No. Is the patient a risk to others?  No. Has the patient been a risk to others in the past 6 months?  No. Has the patient been a risk to others within the distant past?  No.  Allergies:   Allergies  Allergen Reactions  . Diclofenac Sodium Palpitations  . Naproxen Itching, Rash and Hives  . Dust Mite Mixed Allergen Ext [Mite (D. Farinae)] Itching    Respiratory illness, and itchy eyes, eye problems  . Nsaids     Other reaction(s): Other (See Comments)  . Other Itching    Respiratory illness, and itchy eyes, eye problems Respiratory illness, and itchy eyes, eye problems   Current Medications: Current Outpatient Prescriptions  Medication Sig Dispense Refill  . Calcium Carbonate-Vit D-Min (CALCIUM 600+D PLUS MINERALS) 600-400 MG-UNIT TABS Take by mouth.    . cyclobenzaprine (FLEXERIL) 5 MG tablet Take 1 tablet (5 mg total) by mouth at bedtime. 30 tablet 0  . cycloSPORINE (RESTASIS) 0.05 % ophthalmic emulsion Administer 1 drop to both eyes every twelve (12) hours.    Marland Kitchen ibuprofen (ADVIL,MOTRIN) 600 MG tablet Take 1 tablet (600 mg total) by mouth every 8 (eight) hours as needed. 30 tablet 0  . levothyroxine (LEVOXYL) 75 MCG tablet Take by mouth.    . mometasone (NASONEX) 50 MCG/ACT nasal spray 1 -2 spray in each nostril daily    . Multiple Vitamin (MULTI-VITAMINS) TABS     . risperiDONE (RISPERDAL M-TABS) 2 MG disintegrating tablet Take 1 tablet (2 mg total) by mouth at bedtime. 30 tablet 1  . risperiDONE microspheres (RISPERDAL CONSTA) 50 MG injection Inject 2 mLs (50 mg total) into the muscle every 14 (fourteen) days. 1 each 1  . triamcinolone cream (KENALOG) 0.1 % Apply 1 application topically  2 (two) times daily. 30 g 0   No current facility-administered medications for this visit.     Previous Psychotropic Medications: Yes   Substance Abuse History in the last 12 months:  No.  Consequences of Substance Abuse: Negative  Medical Decision Making:  Established Problem, Stable/Improving (1), Review of Psycho-Social Stressors (1), Review and summation of old records (2), Review of Medication Regimen & Side Effects (2) and Review of New Medication or Change in Dosage (2)  Treatment Plan Summary: Medication management   Delusional disorder/Schizophrenia  Patient will transition total  care to [facility] and we will assist her in making an appointment with an M.D. to continue her medication management. She will no  longer see a therapist here since it's easier for patient to have all care at one place She has signed a release and her records, transferred to [facility].    Judee Hennick 12/18/20173:27 PM

## 2016-03-15 ENCOUNTER — Ambulatory Visit: Payer: Self-pay | Admitting: Psychiatry

## 2016-03-15 DIAGNOSIS — M3501 Sicca syndrome with keratoconjunctivitis: Secondary | ICD-10-CM | POA: Diagnosis not present

## 2016-03-16 DIAGNOSIS — F209 Schizophrenia, unspecified: Secondary | ICD-10-CM | POA: Diagnosis not present

## 2016-03-18 DIAGNOSIS — H6060 Unspecified chronic otitis externa, unspecified ear: Secondary | ICD-10-CM | POA: Diagnosis not present

## 2016-03-29 ENCOUNTER — Ambulatory Visit (INDEPENDENT_AMBULATORY_CARE_PROVIDER_SITE_OTHER): Payer: Medicare Other | Admitting: Obstetrics and Gynecology

## 2016-03-29 VITALS — BP 148/87 | HR 108 | Ht 60.0 in | Wt 118.0 lb

## 2016-03-29 DIAGNOSIS — R000 Tachycardia, unspecified: Secondary | ICD-10-CM | POA: Diagnosis not present

## 2016-03-29 DIAGNOSIS — R030 Elevated blood-pressure reading, without diagnosis of hypertension: Secondary | ICD-10-CM

## 2016-03-29 DIAGNOSIS — M792 Neuralgia and neuritis, unspecified: Secondary | ICD-10-CM

## 2016-03-29 DIAGNOSIS — F458 Other somatoform disorders: Secondary | ICD-10-CM

## 2016-03-29 DIAGNOSIS — F200 Paranoid schizophrenia: Secondary | ICD-10-CM

## 2016-03-29 DIAGNOSIS — Z131 Encounter for screening for diabetes mellitus: Secondary | ICD-10-CM

## 2016-03-29 NOTE — Progress Notes (Signed)
    GYNECOLOGY PROGRESS NOTE  Subjective:    Patient ID: Diamond Lucas, female    DOB: 06/11/1960, 56 y.o.   MRN: [id number]  HPI  Patient is a 56 y.o. G0P0000 menopausal female with a h/o paranoid schizophrenia and delusion of pregnancy (on no meds) who presents for discussion of mangement of the her pregnancy.  Patient notes that she desires to discuss having a C-section today in order to deliver her baby.  Patient states that she is concerned that the baby has been in there too long, and that she is afraid that the baby may be harmed by the bats inside the uterus. The bats and the baby have both been speaking to her and told her that they were ready to come out. She would like to spare her baby and also attempt to get rid of the bats.   In addition, patient complains of foot pain (numbness and tingling), thinks that it is due to the extra weight of pregnancy.  Notes that it is becoming painful, and is also the reason she feels she is ready to delivery her baby.   The following portions of the patient's history were reviewed and updated as appropriate: allergies, current medications, past family history, past medical history, past social history, past surgical history and problem list.  Review of Systems Pertinent items noted in HPI and remainder of comprehensive ROS otherwise negative.   Objective:   Blood pressure (!) 148/87, pulse (!) 108, height 5' (1.524 m), weight 118 lb (53.5 kg), last menstrual period 03/29/2015. General appearance: alert and no distress.  Extremities: extremities normal, atraumatic, no cyanosis or edema Neurologic: Grossly normal Psychologic: Anxious, delusional thoughts  Assessment:   Paranoid schizophrenia Delusion of pregnancy Elevated BP Tachycardia  Peripheral neuropathy  Plan:   Unofficial brief ultrasound performed to show patient that her uterus was normal size and empty (no bats or fetus).    Elevated BP, patient with no prior h/o HTN.  Will  continue to monitor at future visits.  Tachycardia, mild.  Patient asymptomatic.  In light of elevated BP, may be secondary to patient's anxious state.  Peripheral neuropathy. Patient notes that she wear appropriate fitting shoes, denies h/o DM but would like to be screened.  Will order HgbA1c.    [name], MD [facility]

## 2016-03-30 DIAGNOSIS — L853 Xerosis cutis: Secondary | ICD-10-CM | POA: Diagnosis not present

## 2016-03-30 DIAGNOSIS — R21 Rash and other nonspecific skin eruption: Secondary | ICD-10-CM | POA: Diagnosis not present

## 2016-03-30 DIAGNOSIS — L503 Dermatographic urticaria: Secondary | ICD-10-CM | POA: Diagnosis not present

## 2016-03-30 DIAGNOSIS — L509 Urticaria, unspecified: Secondary | ICD-10-CM | POA: Diagnosis not present

## 2016-03-30 DIAGNOSIS — L821 Other seborrheic keratosis: Secondary | ICD-10-CM | POA: Diagnosis not present

## 2016-03-30 LAB — GLUCOSE, RANDOM: GLUCOSE: 83 mg/dL (ref 65–99)

## 2016-03-31 ENCOUNTER — Ambulatory Visit: Payer: Medicare Other | Admitting: Psychiatry

## 2016-04-04 ENCOUNTER — Telehealth: Payer: Self-pay

## 2016-04-04 NOTE — Telephone Encounter (Signed)
-----   Message from [name], MD sent at 04/01/2016 10:29 AM EST ----- Please inform patient that her glucose screen was normal. She does not have diabetes.

## 2016-04-04 NOTE — Telephone Encounter (Signed)
Called pt no answer. LM for pt informing her of negative results.  

## 2016-04-05 DIAGNOSIS — F4323 Adjustment disorder with mixed anxiety and depressed mood: Secondary | ICD-10-CM | POA: Diagnosis not present

## 2016-04-18 DIAGNOSIS — F250 Schizoaffective disorder, bipolar type: Secondary | ICD-10-CM | POA: Diagnosis not present

## 2016-04-28 DIAGNOSIS — Z85850 Personal history of malignant neoplasm of thyroid: Secondary | ICD-10-CM | POA: Diagnosis not present

## 2016-04-28 DIAGNOSIS — F200 Paranoid schizophrenia: Secondary | ICD-10-CM | POA: Diagnosis not present

## 2016-04-28 DIAGNOSIS — E890 Postprocedural hypothyroidism: Secondary | ICD-10-CM | POA: Diagnosis not present

## 2016-04-28 DIAGNOSIS — M3501 Sicca syndrome with keratoconjunctivitis: Secondary | ICD-10-CM | POA: Diagnosis not present

## 2016-04-28 DIAGNOSIS — Z3200 Encounter for pregnancy test, result unknown: Secondary | ICD-10-CM | POA: Diagnosis not present

## 2016-04-28 DIAGNOSIS — F458 Other somatoform disorders: Secondary | ICD-10-CM | POA: Diagnosis not present

## 2016-04-28 DIAGNOSIS — Z8639 Personal history of other endocrine, nutritional and metabolic disease: Secondary | ICD-10-CM | POA: Diagnosis not present

## 2016-04-28 DIAGNOSIS — R7989 Other specified abnormal findings of blood chemistry: Secondary | ICD-10-CM | POA: Diagnosis not present

## 2016-05-03 DIAGNOSIS — F4323 Adjustment disorder with mixed anxiety and depressed mood: Secondary | ICD-10-CM | POA: Diagnosis not present

## 2016-05-10 ENCOUNTER — Encounter: Payer: Self-pay | Admitting: Obstetrics and Gynecology

## 2016-05-10 ENCOUNTER — Ambulatory Visit (INDEPENDENT_AMBULATORY_CARE_PROVIDER_SITE_OTHER): Payer: Medicare Other | Admitting: Obstetrics and Gynecology

## 2016-05-10 VITALS — BP 148/84 | HR 71 | Ht 60.0 in | Wt 115.3 lb

## 2016-05-10 DIAGNOSIS — F458 Other somatoform disorders: Secondary | ICD-10-CM | POA: Diagnosis not present

## 2016-05-10 DIAGNOSIS — R7989 Other specified abnormal findings of blood chemistry: Secondary | ICD-10-CM

## 2016-05-10 DIAGNOSIS — F200 Paranoid schizophrenia: Secondary | ICD-10-CM | POA: Diagnosis not present

## 2016-05-10 DIAGNOSIS — R946 Abnormal results of thyroid function studies: Secondary | ICD-10-CM

## 2016-05-10 DIAGNOSIS — E229 Hyperfunction of pituitary gland, unspecified: Secondary | ICD-10-CM

## 2016-05-10 NOTE — Progress Notes (Signed)
    GYNECOLOGY PROGRESS NOTE  Subjective:    Patient ID: Diamond Lucas, female    DOB: 02-25-1961, 56 y.o.   MRN: [id number]  HPI  Patient is a 56 y.o. G0P0000 female with h/o paranoid schizophrenia and delusion of pregnancy who presents for follow up of "bats in her uterus".  Patient notes that she was seen at [facility] (Dr. Leonides Schanz) ~ 2 weeks ago for discussion of ways to get rid of the bats.  States that she discussed possibly having a tubal ligation vs hysterectomy.  States that she just wants to do what's best for the "babies", and wants to get rid of the bats as they are "really starting to hurt her".   Patient had labs drawn at her visit (has h/o thyroid carcinoma and prolactinemia in the past).  Has an appointment in 2 weeks to discuss results.   The following portions of the patient's history were reviewed and updated as appropriate: allergies, current medications, past family history, past medical history, past social history, past surgical history and problem list.  Review of Systems Pertinent items noted in HPI and remainder of comprehensive ROS otherwise negative.   Objective:   Blood pressure (!) 148/84, pulse 71, height 5' (1.524 m), weight 115 lb 4.8 oz (52.3 kg), last menstrual period 04/10/2015. General appearance: alert and no distress Remainder of exam deferred.    Labs:   Reviewed from Care Everywhere:   Prolactin level (04/28/2016): 29.7 (H).   Previously 131.2 (H) on 02/22/2016.  HCG, serum (04/28/2016): 2.3 (negative) TSH (04/28/2016: 0.399 (L).   Assessment:   Paranoid schizophrenia Delusion of pregnancy Abnormal thyroid study Abnormal prolactin level  Plan:   - Discussed labs with patient.  Prolactin level elevation in November may have been due to use of Risperdal, however patient no longer on medication.  Levels borderline elevated today.  May be secondary to pituitary gland, or due to low TSH (however need remainder of thyroid panel to assess).   Offered patient to have blood drawn today, however patient notes that she will wait until her next visit in 2 weeks at [facility].  - HCG levels negative, again confirming patient barren state. Patient states that she understands, but still wants to make sure that we get the bats out for fear of harm to future children.  - Will reach out to Dr. Leonides Schanz at [facility] to discuss best co-management options with patient, as she continues to seek opinions from several different GYN offices regarding the bats in her uterus, as well as requesting surgical intervention. Patient has h/o postmenopausal spotting in the past, could be legitimate reason for removal of the uterus (although my concern would be that even s/p hysterectomy patient would believe that she could still be pregnant with bats).  Will also reach out to Psychologist for recommendations.      A total of 15 minutes were spent face-to-face with the patient during this encounter and over half of that time dealt with counseling and coordination of care.  [name], MD [facility]

## 2016-05-12 ENCOUNTER — Telehealth: Payer: Self-pay | Admitting: Physician Assistant

## 2016-05-12 NOTE — Telephone Encounter (Signed)
She would need to be evaluated for lice. I am not giving anything orally without a true diagnosis due to possible side effects, interactions, and liver damage.

## 2016-05-12 NOTE — Telephone Encounter (Signed)
Pt called saying she has a crab louse bite on her head and she wants to know what you would like her to do whether to come in or call her in something.  She said she has been using rid.  She uses [address]  Pt's cal back  [phone]  Thank Diamond Nickel

## 2016-05-12 NOTE — Telephone Encounter (Signed)
Previous checks she has never had lice. I would recommend for her to use selsum blue shampoo as she may be having some eczema on her scalp that is causing her itching and that sensation.

## 2016-05-12 NOTE — Telephone Encounter (Signed)
Advised patient as below. Appt scheduled for tomorrow.

## 2016-05-12 NOTE — Telephone Encounter (Signed)
LMTCB  Thanks,  -[name] 

## 2016-05-12 NOTE — Telephone Encounter (Signed)
Patient reports that she has already tried selsum blue. Patient is requesting something to take orally.

## 2016-05-12 NOTE — Telephone Encounter (Signed)
Please Review.  Thanks,  -Lauran Romanski 

## 2016-05-13 ENCOUNTER — Ambulatory Visit (INDEPENDENT_AMBULATORY_CARE_PROVIDER_SITE_OTHER): Payer: Medicare Other | Admitting: Physician Assistant

## 2016-05-13 ENCOUNTER — Encounter: Payer: Self-pay | Admitting: Physician Assistant

## 2016-05-13 VITALS — BP 120/60 | HR 99 | Temp 98.4°F | Resp 16 | Wt 115.4 lb

## 2016-05-13 DIAGNOSIS — L219 Seborrheic dermatitis, unspecified: Secondary | ICD-10-CM

## 2016-05-13 DIAGNOSIS — E229 Hyperfunction of pituitary gland, unspecified: Secondary | ICD-10-CM | POA: Diagnosis not present

## 2016-05-13 DIAGNOSIS — R7989 Other specified abnormal findings of blood chemistry: Secondary | ICD-10-CM

## 2016-05-13 DIAGNOSIS — F22 Delusional disorders: Secondary | ICD-10-CM

## 2016-05-13 DIAGNOSIS — E890 Postprocedural hypothyroidism: Secondary | ICD-10-CM | POA: Diagnosis not present

## 2016-05-13 MED ORDER — KETOCONAZOLE 2 % EX CREA: 1.0000 "application " | TOPICAL_CREAM | Freq: Every day | CUTANEOUS | 0 refills | Status: DC

## 2016-05-13 NOTE — Patient Instructions (Signed)
Prolactin Level Test Why am I having this test? Prolactin is a hormone that is produced by the pituitary gland. This test is often used to diagnose and monitor disease problems in the pituitary gland. Prolactin levels go up and down (fluctuate) due to stress, illness, trauma, or surgery. Increased levels are commonly associated with an absent menstrual cycle (amenorrhea) and tumors. What kind of sample is taken? A blood sample is required for this test. It is usually collected by inserting a needle into a vein. How do I prepare for this test? There is no preparation required for this test. What are the reference ranges? Reference ranges are considered healthy ranges established after testing a large group of healthy people. Reference ranges may vary among different people, labs, and hospitals. It is your responsibility to obtain your test results. Ask the lab or department performing the test when and how you will get your results.  Adult female: less than 20 ng/mL.  Adult female: less than 28 ng/mL.  Pregnant female:  Trimester 1: less than 80 ng/mL.  Trimester 2: less than 160 ng/mL.  Trimester 3: less than 400 ng/mL. What do the results mean? Increased levels of prolactin may indicate:  A pituitary gland tumor.  Amenorrhea.  Hypothyroidism.  Certain pituitary or reproductive syndromes.  Kidney failure. Decreased levels of prolactin may indicate:  Lack of blood to the pituitary gland.  Pituitary gland failure. Talk with your health care provider to discuss your results, treatment options, and if necessary, the need for more tests. Talk with your health care provider if you have any questions about your results. Talk with your health care provider to discuss your results, treatment options, and if necessary, the need for more tests. Talk with your health care provider if you have any questions about your results. This information is not intended to replace advice given to you  by your health care provider. Make sure you discuss any questions you have with your health care provider. Document Released: 04/16/2004 Document Revised: 11/18/2015 Document Reviewed: 09/06/2013 Elsevier Interactive Patient Education  2017 [employer].

## 2016-05-13 NOTE — Progress Notes (Signed)
Patient: Diamond Lucas Female    DOB: 12/07/1960   55 y.o.   MRN: [id number] Visit Date: 05/13/2016  Today's Provider: Mar Daring, PA-C   Chief Complaint  Patient presents with  . Head Lice   Subjective:    HPI Patient is here today with c/o having crab louse in her head that is itching a lot and scalp was bleeding yesterday from the "attack". She reports that her genital area was itching also last night and was able to feel the crab louse attacking and trying to get into the clitoris and rectal area.She reports that she put her finger inside her rectum and was able to get one "crab louse" out.  She is talking also today about getting a tubal ligation vs complete hysterectomy with Dr. Leonides Schanz. She reports she does not know if she would want to go through it. She reports being scared of anesthesia and having her teeth broken. I reviewed Dr. Guido Sander note and discussed what Dr. Leonides Schanz had said in her note.   She then started talking about a maintenance man coming into her apartment in January and when they left there was a pile of some liquid on the ground in her bathroom. She started cleaning it and realized it was semen. She has been trying to continue to clean that area, but states some may have gotten on her. She reports she has notified the police of this incident.   She also discussed that she has been going to [place] and mentions having an appointment with them next week. She reports she is compliant with her risperdal.   She does have an appointment with [facility] endocrinology in April which she knows about. This is her follow up for her thyroid carcinoma. She also has an appt with Dr. Leonides Schanz on 05/19/16. We did review labs today that Dr. Leonides Schanz had done.    Allergies  Allergen Reactions  . Diclofenac Sodium Palpitations  . Naproxen Itching, Rash and Hives  . Dust Mite Mixed Allergen Ext [Mite (D. Farinae)] Itching    Respiratory illness, and itchy eyes, eye problems  . Nsaids      Other reaction(s): Other (See Comments)  . Other Itching    Respiratory illness, and itchy eyes, eye problems Respiratory illness, and itchy eyes, eye problems     Current Outpatient Prescriptions:  .  Calcium Carbonate-Vit D-Min (CALCIUM 600+D PLUS MINERALS) 600-400 MG-UNIT TABS, Take by mouth., Disp: , Rfl:  .  cycloSPORINE (RESTASIS) 0.05 % ophthalmic emulsion, Administer 1 drop to both eyes every twelve (12) hours., Disp: , Rfl:  .  ibuprofen (ADVIL,MOTRIN) 600 MG tablet, Take 1 tablet (600 mg total) by mouth every 8 (eight) hours as needed., Disp: 30 tablet, Rfl: 0 .  levothyroxine (SYNTHROID, LEVOTHROID) 75 MCG tablet, TAKE 1 TABLET BY MOUTH DAILY, Disp: , Rfl:  .  Multiple Vitamin (MULTI-VITAMINS) TABS, , Disp: , Rfl:  .  PREMPRO 0.625-2.5 MG tablet, , Disp: , Rfl:  .  risperiDONE (RISPERDAL M-TABS) 2 MG disintegrating tablet, Take 1 tablet (2 mg total) by mouth at bedtime., Disp: 30 tablet, Rfl: 1 .  cyclobenzaprine (FLEXERIL) 5 MG tablet, Take 1 tablet (5 mg total) by mouth at bedtime. (Patient not taking: Reported on 05/13/2016), Disp: 30 tablet, Rfl: 0 .  hydrOXYzine (ATARAX/VISTARIL) 25 MG tablet, TK 1 T PO QHS, Disp: , Rfl: 1 .  mometasone (NASONEX) 50 MCG/ACT nasal spray, 1 -2 spray in each nostril daily, Disp: ,  Rfl:   Review of Systems  Constitutional: Negative.   Respiratory: Negative.   Cardiovascular: Negative.   Gastrointestinal: Negative.   Psychiatric/Behavioral:       Delusions    Social History  Substance Use Topics  . Smoking status: Never Smoker  . Smokeless tobacco: Never Used  . Alcohol use No   Objective:   BP 120/60 (BP Location: Left Arm, Patient Position: Sitting, Cuff Size: Normal)   Pulse 99   Temp 98.4 F (36.9 C) (Oral)   Resp 16   Wt 115 lb 6.4 oz (52.3 kg)   LMP 04/10/2015 Comment: Only spotting  BMI 22.54 kg/m   Physical Exam  Constitutional: She appears well-developed and well-nourished. No distress.  HENT:  Head:  Normocephalic and atraumatic.  Neck: Normal range of motion. Neck supple.  Cardiovascular: Normal rate, regular rhythm and normal heart sounds.  Exam reveals no gallop and no friction rub.   No murmur heard. Pulmonary/Chest: Effort normal and breath sounds normal. No respiratory distress. She has no wheezes. She has no rales.  Skin: She is not diaphoretic.  Scalp with some dry, flaky skin. No erythema. No louse. No patches of irritation.  Vitals reviewed.     Assessment & Plan:     1. Seborrheic dermatitis of scalp Will give Nizoral 2% shampoo for eczema of the scalp to see if this helps her symptoms.    2. Elevated prolactin level (HCC) Discussed elevated level noted in hospital and with Dr. Guido Sander f/u labs. Discussed MRI for pituitary issue but she declined.   3. 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 She reports taking her Risperdal to me, but declines taking to Dr. Marcelline Mates. She is to be following up with [facility] and reports that she is attending appointments. I have no records from [facility] for her. She does not recall her psychiatrist name. Her counselor is Engineer, civil (consulting). She has sought help with Dr. Leonides Schanz and Dr. Marcelline Mates for her delusions of being impregnated with bats.   4. Postoperative hypothyroidism She has documented history of thyroid carcinoma s/p thyroidectomy. She is on levothyroxine 59mcg but her levels have shown overcorrection. She reports she is followed by [facility] and she does have an appointment scheduled with them in 06/2016.        Mar Daring, PA-C  [facility]

## 2016-05-17 DIAGNOSIS — E890 Postprocedural hypothyroidism: Secondary | ICD-10-CM | POA: Insufficient documentation

## 2016-05-19 DIAGNOSIS — F458 Other somatoform disorders: Secondary | ICD-10-CM | POA: Diagnosis not present

## 2016-05-19 DIAGNOSIS — R102 Pelvic and perineal pain: Secondary | ICD-10-CM | POA: Diagnosis not present

## 2016-05-19 DIAGNOSIS — R109 Unspecified abdominal pain: Secondary | ICD-10-CM | POA: Diagnosis not present

## 2016-05-23 ENCOUNTER — Telehealth: Payer: Self-pay

## 2016-05-23 DIAGNOSIS — L309 Dermatitis, unspecified: Secondary | ICD-10-CM

## 2016-05-23 MED ORDER — KETOCONAZOLE 2 % EX SHAM: 1.0000 "application " | MEDICATED_SHAMPOO | Freq: Once | CUTANEOUS | 0 refills | Status: AC

## 2016-05-23 NOTE — Telephone Encounter (Signed)
Sent in a shampoo for her to try. She will apply to her whole body, then rinse and may reapply in one week if needed.

## 2016-05-23 NOTE — Telephone Encounter (Signed)
Patient called and states she used cream for the head lice and even used apple cider vinegar and it seemed like the problem was resolving but it seems like it is coming back. She wants to know does she need to try something oral or what to do? Some itching and thought she saw something in her clothes with washing.-aa

## 2016-05-24 NOTE — Telephone Encounter (Signed)
LMTCB  Thanks,  -[name] 

## 2016-05-24 NOTE — Telephone Encounter (Signed)
Gave pt message.  Thanks [employer]

## 2016-05-26 ENCOUNTER — Telehealth: Payer: Self-pay

## 2016-05-26 NOTE — Telephone Encounter (Signed)
Patient called and states that shampoo she has been using is helping but she thinks she knits imbedded in her head, she can not see it but can feel it. She has been using the special comb for this to try and get them out. She wanted to see if she can get an oral medication for this. She also states saw a kid in [place] that has head lice and apparently per her words "head lice break out in [place]."-aa

## 2016-05-27 NOTE — Telephone Encounter (Signed)
Please try to re-iterate to the patient that on exam she did not have any lice or nits. She did have some dry, flaky skin that could be mistaken as nits and will also cause itching. I would recommend she continue using selsum blue or head and shoulders. There is no oral pill for lice.

## 2016-05-30 ENCOUNTER — Telehealth: Payer: Self-pay | Admitting: Physician Assistant

## 2016-05-30 NOTE — Telephone Encounter (Signed)
Patient advised and is scheduled Wednesday March 7 @ 1:30pm.  Thanks,  -[name]

## 2016-05-30 NOTE — Telephone Encounter (Signed)
Please Review.  Thanks,  -Rubens Cranston 

## 2016-05-30 NOTE — Telephone Encounter (Signed)
Pt states she has head lice, she has captured some and put them on scotch tape and wants to know if you would like to see.  Pt also states at night she scratches her head and gets the bugs under her fingernails and wants some type of treatment for them.  Pt states she doesn't want to end up in the hospital.

## 2016-05-30 NOTE — Telephone Encounter (Signed)
She will have to schedule another appt and bring in for me to look at.

## 2016-06-01 ENCOUNTER — Encounter: Payer: Self-pay | Admitting: Physician Assistant

## 2016-06-01 ENCOUNTER — Ambulatory Visit (INDEPENDENT_AMBULATORY_CARE_PROVIDER_SITE_OTHER): Payer: Medicare Other | Admitting: Physician Assistant

## 2016-06-01 VITALS — BP 118/60 | HR 62 | Temp 98.0°F | Resp 16 | Wt 115.2 lb

## 2016-06-01 DIAGNOSIS — F22 Delusional disorders: Secondary | ICD-10-CM

## 2016-06-01 DIAGNOSIS — L219 Seborrheic dermatitis, unspecified: Secondary | ICD-10-CM | POA: Diagnosis not present

## 2016-06-01 DIAGNOSIS — Z118 Encounter for screening for other infectious and parasitic diseases: Secondary | ICD-10-CM | POA: Diagnosis not present

## 2016-06-01 MED ORDER — SPINOSAD 0.9 % EX SUSP: CUTANEOUS | 0 refills | Status: DC

## 2016-06-01 NOTE — Progress Notes (Signed)
Patient: Diamond Lucas Female    DOB: 1960/06/21   56 y.o.   MRN: [id number] Visit Date: 06/01/2016  Today's Provider: Mar Daring, PA-C   Chief Complaint  Patient presents with  . itching scalp   Subjective:    HPI Adriahna Shearman is a 56 y.o female with chronic paranoid schizophrenia with delusions with current c/o persistent head lice. She wants some oral medication for it. She wants to try Stromectol. She reports that she is concerned because "head lice" is all over [address].  She has not been taking her risperidone recently. She had been admitted last year for delusions of pregnancy with bats. She was being followed by [facility] behavioral and was receiving Risperidone injections. Per record review this method was too expensive and she decided to continue her care at [facility] and was switched back to oral Risperidone. It is not known if she ever truly started the oral risperidone. She will tell our office yes, but upon review of other provider's notes she tells them no. She does state she still sees a Social worker at [facility].      Allergies  Allergen Reactions  . Diclofenac Sodium Palpitations  . Naproxen Itching, Rash and Hives  . Dust Mite Mixed Allergen Ext [Mite (D. Farinae)] Itching    Respiratory illness, and itchy eyes, eye problems  . Nsaids     Other reaction(s): Other (See Comments)  . Other Itching    Respiratory illness, and itchy eyes, eye problems Respiratory illness, and itchy eyes, eye problems     Current Outpatient Prescriptions:  .  Calcium Carbonate-Vit D-Min (CALCIUM 600+D PLUS MINERALS) 600-400 MG-UNIT TABS, Take by mouth., Disp: , Rfl:  .  cycloSPORINE (RESTASIS) 0.05 % ophthalmic emulsion, Administer 1 drop to both eyes every twelve (12) hours., Disp: , Rfl:  .  hydrOXYzine (ATARAX/VISTARIL) 25 MG tablet, TK 1 T PO QHS, Disp: , Rfl: 1 .  ibuprofen (ADVIL,MOTRIN) 600 MG tablet, Take 1 tablet (600 mg total) by mouth every 8 (eight) hours as needed., Disp:  30 tablet, Rfl: 0 .  levothyroxine (SYNTHROID, LEVOTHROID) 75 MCG tablet, TAKE 1 TABLET BY MOUTH DAILY, Disp: , Rfl:  .  mometasone (NASONEX) 50 MCG/ACT nasal spray, 1 -2 spray in each nostril daily, Disp: , Rfl:  .  Multiple Vitamin (MULTI-VITAMINS) TABS, , Disp: , Rfl:  .  PREMPRO 0.625-2.5 MG tablet, , Disp: , Rfl:  .  risperiDONE (RISPERDAL M-TABS) 2 MG disintegrating tablet, Take 1 tablet (2 mg total) by mouth at bedtime., Disp: 30 tablet, Rfl: 1 .  cyclobenzaprine (FLEXERIL) 5 MG tablet, Take 1 tablet (5 mg total) by mouth at bedtime. (Patient not taking: Reported on 05/13/2016), Disp: 30 tablet, Rfl: 0  Review of Systems  Constitutional: Negative.   Respiratory: Negative.   Cardiovascular: Negative.   Gastrointestinal: Negative.   Skin:       itching  Neurological: Negative.   Psychiatric/Behavioral: Positive for hallucinations.    Social History  Substance Use Topics  . Smoking status: Never Smoker  . Smokeless tobacco: Never Used  . Alcohol use No   Objective:   BP 118/60 (BP Location: Right Arm, Patient Position: Sitting, Cuff Size: Normal)   Pulse 62   Temp 98 F (36.7 C) (Oral)   Resp 16   Wt 115 lb 3.2 oz (52.3 kg)   LMP 04/10/2015 Comment: Only spotting  BMI 22.50 kg/m    Physical Exam  Constitutional: She appears well-developed and well-nourished. No  distress.  Neck: Normal range of motion. Neck supple.  Cardiovascular: Normal rate, regular rhythm and normal heart sounds.  Exam reveals no gallop and no friction rub.   No murmur heard. Pulmonary/Chest: Effort normal and breath sounds normal. No respiratory distress. She has no wheezes. She has no rales.  Skin: She is not diaphoretic.  Some flaky skin noted on scalp; she does also have dry skin noted on her arms and legs. She brings in some samples of what she felt was lice. I personally examined and examined microscopically. There were some that were flakes of skin and others that were like a grain of dirt. No  lice or nits visualized.  Psychiatric: Her speech is normal. Her mood appears anxious. She is slowed. Thought content is delusional. She expresses no suicidal plans and no homicidal plans.  Vitals reviewed.      Assessment & Plan:     1. Screening for head lice No head lice noted but patient states she was exposed. I will treat with spinosad suspension as below since it is safe, non-irritative and will kill nits and grown lice. She should not need any repeat treatment and she does not need to worry about nit combs. - Spinosad 0.9 % SUSP; Apply to hair and scalp, do not put on face, leave on for 10 minutes and wash out. No need to repeat.  Dispense: 120 mL; Refill: 0  2. Seborrheic dermatitis of scalp I feel her symptoms are more likely secondary to seborrheic dermatitis of the scalp causing itching. I have treated the patient with ketoconazole shampoos that helped but she has not continued. Also encouraged patient to start using Selsum Blue or Head and Shoulders. She has refused stating she has done this without relief.  3. 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 She is escalating with delusions like she has previously. I reached out to [place], but could not get in touch with anyone. I left a message but have not heard back from anyone.       Mar Daring, PA-C  [facility]

## 2016-06-03 ENCOUNTER — Other Ambulatory Visit: Payer: Self-pay | Admitting: Physician Assistant

## 2016-06-03 NOTE — Telephone Encounter (Signed)
LMTCB  Thanks,  -[name] 

## 2016-06-03 NOTE — Telephone Encounter (Signed)
Pt contacted office for refill request on the following medications:  Spinosad 0.9 % SUSP.  [employer].  [phone]

## 2016-06-03 NOTE — Telephone Encounter (Signed)
This is a one time treatment. No need to repeat.

## 2016-06-03 NOTE — Telephone Encounter (Signed)
Please Review.  Thanks,  -Gaddiel Cullens 

## 2016-06-07 ENCOUNTER — Telehealth: Payer: Self-pay | Admitting: *Deleted

## 2016-06-07 ENCOUNTER — Telehealth: Payer: Self-pay

## 2016-06-07 NOTE — Telephone Encounter (Signed)
Diamond Lucas was advised about the Spinosad  tha it is a one time treatment. She is asking if you have any other advise for her on what to try? Because she took out "black" head lice yesterday.  Thanks,  -[name]

## 2016-06-07 NOTE — Telephone Encounter (Signed)
Patient states that Dr. Marcelline Mates prescribed her Prempro but she states that it didn't work. She also states that is cost too must and she is not going to refill that RX. She aslo states that Dr. Marcelline Mates discussed a  Hysterectomy but she wants to have a cooper IUD instead. Patient is requesting a call back . Her number [phone]. Please advise . Thank you

## 2016-06-07 NOTE — Telephone Encounter (Signed)
LM as directed below. If any question or concern to call back. Closing encounter.  Thanks,  -Happy Begeman

## 2016-06-07 NOTE — Telephone Encounter (Signed)
Open in error

## 2016-06-10 NOTE — Telephone Encounter (Signed)
Raquell is calling to let Tawanna Sat know that she still is having a the problem with the "lice". She states that she spoke with a nurse at a coffee shop that works at a clinic that specializes on "lice" and she wanted to know what was the clinic name? Told patient that I was not aware of this clinic near [place], only the health department. She reports that she already tried the health department and they were not "much help".  Thanks,  -[name]

## 2016-06-10 NOTE — Telephone Encounter (Signed)
She has been evaluated twice and reassured there were no lice present. I even treated prophylactically in case she was exposed. This treatment kills both nits and lice and should not require re-treatment. I still believe this is seborrheic dermatitis and feel she should consistently continue washing her hair with selsum blue to see if symptoms of itching improve. If no improvement in itching in 2-3 weeks, then we can send in ketoconazole shampoo for her to use twice weekly for the itching.

## 2016-06-13 NOTE — Telephone Encounter (Signed)
Patient advised as directed below. Per patient she reports that she is not doing well with the itching  That even last night her neighbor was disturb by her making noise. She feels like she needs to get a second opinion and she is going to research  One of the facilities that specializes on this type of treatment or she is going to go to the hospital.   Thanks,  -Trina Asch

## 2016-06-13 NOTE — Telephone Encounter (Signed)
Noted. Thanks.

## 2016-06-17 ENCOUNTER — Telehealth: Payer: Self-pay | Admitting: Physician Assistant

## 2016-06-17 DIAGNOSIS — L219 Seborrheic dermatitis, unspecified: Secondary | ICD-10-CM

## 2016-06-17 DIAGNOSIS — F22 Delusional disorders: Secondary | ICD-10-CM

## 2016-06-17 NOTE — Telephone Encounter (Signed)
Pt is requesting Diamond Lucas return her call b/c she is still having the problem with crab lice and she also thinks she has something smaller than the crab lice as well. Pt stated the she thinks she has leprosy and would like to get a referral to Dr. Adrian Prows with [facility] for infectious disease. Office# [phone] CB#[phone]. Please advise. Thanks TNP

## 2016-06-20 DIAGNOSIS — R102 Pelvic and perineal pain: Secondary | ICD-10-CM | POA: Diagnosis not present

## 2016-06-21 DIAGNOSIS — F4323 Adjustment disorder with mixed anxiety and depressed mood: Secondary | ICD-10-CM | POA: Diagnosis not present

## 2016-06-23 MED ORDER — KETOCONAZOLE 2 % EX SHAM: 1.0000 "application " | MEDICATED_SHAMPOO | CUTANEOUS | 0 refills | Status: DC

## 2016-06-23 NOTE — Telephone Encounter (Signed)
I will send in her the ketoconazole shampoo for her to use 2-3 times per week. I will refer her to dermatology, not infectious disease, as I do not feel this is an infectious disease issue.

## 2016-06-23 NOTE — Telephone Encounter (Signed)
Patient advised as directed below.  Thanks,  -Jayion Schneck 

## 2016-06-29 ENCOUNTER — Ambulatory Visit: Payer: Medicare Other

## 2016-06-29 ENCOUNTER — Ambulatory Visit (INDEPENDENT_AMBULATORY_CARE_PROVIDER_SITE_OTHER): Payer: Medicare Other | Admitting: Physician Assistant

## 2016-06-29 VITALS — BP 122/66 | HR 88 | Temp 98.7°F | Ht 60.0 in | Wt 113.0 lb

## 2016-06-29 VITALS — BP 122/66 | HR 88 | Temp 98.7°F | Ht 60.0 in | Wt 113.4 lb

## 2016-06-29 DIAGNOSIS — F312 Bipolar disorder, current episode manic severe with psychotic features: Secondary | ICD-10-CM

## 2016-06-29 DIAGNOSIS — L219 Seborrheic dermatitis, unspecified: Secondary | ICD-10-CM

## 2016-06-29 DIAGNOSIS — Z0000 Encounter for general adult medical examination without abnormal findings: Secondary | ICD-10-CM | POA: Diagnosis not present

## 2016-06-29 DIAGNOSIS — E890 Postprocedural hypothyroidism: Secondary | ICD-10-CM | POA: Diagnosis not present

## 2016-06-29 DIAGNOSIS — L719 Rosacea, unspecified: Secondary | ICD-10-CM

## 2016-06-29 DIAGNOSIS — F22 Delusional disorders: Secondary | ICD-10-CM | POA: Diagnosis not present

## 2016-06-29 MED ORDER — KETOCONAZOLE 2 % EX SHAM: 1.0000 "application " | MEDICATED_SHAMPOO | CUTANEOUS | 0 refills | Status: DC

## 2016-06-29 NOTE — Patient Instructions (Signed)
Ms. Dearinger , Thank you for taking time to come for your Medicare Wellness Visit. I appreciate your ongoing commitment to your health goals. Please review the following plan we discussed and let me know if I can assist you in the future.   Screening recommendations/referrals: Colonoscopy: last done 09/29/09 Mammogram: declined Recommended yearly ophthalmology/optometry visit for glaucoma screening and checkup Recommended yearly dental visit for hygiene and checkup  Vaccinations: Influenza vaccine: declined Pneumococcal vaccine: due at 54 years old Tdap vaccine: declined   Advanced directives: Requested copy  Conditions/risks identified: fall risk prevention  Next appointment: None  Preventive Care 40-64 Years, Female Preventive care refers to lifestyle choices and visits with your health care provider that can promote health and wellness. What does preventive care include?  A yearly physical exam. This is also called an annual well check.  Dental exams once or twice a year.  Routine eye exams. Ask your health care provider how often you should have your eyes checked.  Personal lifestyle choices, including:  Daily care of your teeth and gums.  Regular physical activity.  Eating a healthy diet.  Avoiding tobacco and drug use.  Limiting alcohol use.  Practicing safe sex.  Taking low-dose aspirin daily starting at age 57.  Taking vitamin and mineral supplements as recommended by your health care provider. What happens during an annual well check? The services and screenings done by your health care provider during your annual well check will depend on your age, overall health, lifestyle risk factors, and family history of disease. Counseling  Your health care provider may ask you questions about your:  Alcohol use.  Tobacco use.  Drug use.  Emotional well-being.  Home and relationship well-being.  Sexual activity.  Eating habits.  Work and work  Statistician.  Method of birth control.  Menstrual cycle.  Pregnancy history. Screening  You may have the following tests or measurements:  Height, weight, and BMI.  Blood pressure.  Lipid and cholesterol levels. These may be checked every 5 years, or more frequently if you are over 45 years old.  Skin check.  Lung cancer screening. You may have this screening every year starting at age 48 if you have a 30-pack-year history of smoking and currently smoke or have quit within the past 15 years.  Fecal occult blood test (FOBT) of the stool. You may have this test every year starting at age 30.  Flexible sigmoidoscopy or colonoscopy. You may have a sigmoidoscopy every 5 years or a colonoscopy every 10 years starting at age 40.  Hepatitis C blood test.  Hepatitis B blood test.  Sexually transmitted disease (STD) testing.  Diabetes screening. This is done by checking your blood sugar (glucose) after you have not eaten for a while (fasting). You may have this done every 1-3 years.  Mammogram. This may be done every 1-2 years. Talk to your health care provider about when you should start having regular mammograms. This may depend on whether you have a family history of breast cancer.  BRCA-related cancer screening. This may be done if you have a family history of breast, ovarian, tubal, or peritoneal cancers.  Pelvic exam and Pap test. This may be done every 3 years starting at age 67. Starting at age 63, this may be done every 5 years if you have a Pap test in combination with an HPV test.  Bone density scan. This is done to screen for osteoporosis. You may have this scan if you are at high risk  for osteoporosis. Discuss your test results, treatment options, and if necessary, the need for more tests with your health care provider. Vaccines  Your health care provider may recommend certain vaccines, such as:  Influenza vaccine. This is recommended every year.  Tetanus, diphtheria,  and acellular pertussis (Tdap, Td) vaccine. You may need a Td booster every 10 years.  Zoster vaccine. You may need this after age 61.  Pneumococcal 13-valent conjugate (PCV13) vaccine. You may need this if you have certain conditions and were not previously vaccinated.  Pneumococcal polysaccharide (PPSV23) vaccine. You may need one or two doses if you smoke cigarettes or if you have certain conditions. Talk to your health care provider about which screenings and vaccines you need and how often you need them. This information is not intended to replace advice given to you by your health care provider. Make sure you discuss any questions you have with your health care provider. Document Released: 04/10/2015 Document Revised: 12/02/2015 Document Reviewed: 01/13/2015 Elsevier Interactive Patient Education  2017 [employer] Prevention in the Home Falls can cause injuries. They can happen to people of all ages. There are many things you can do to make your home safe and to help prevent falls. What can I do on the outside of my home?  Regularly fix the edges of walkways and driveways and fix any cracks.  Remove anything that might make you trip as you walk through a door, such as a raised step or threshold.  Trim any bushes or trees on the path to your home.  Use bright outdoor lighting.  Clear any walking paths of anything that might make someone trip, such as rocks or tools.  Regularly check to see if handrails are loose or broken. Make sure that both sides of any steps have handrails.  Any raised decks and porches should have guardrails on the edges.  Have any leaves, snow, or ice cleared regularly.  Use sand or salt on walking paths during winter.  Clean up any spills in your garage right away. This includes oil or grease spills. What can I do in the bathroom?  Use night lights.  Install grab bars by the toilet and in the tub and shower. Do not use towel bars as grab  bars.  Use non-skid mats or decals in the tub or shower.  If you need to sit down in the shower, use a plastic, non-slip stool.  Keep the floor dry. Clean up any water that spills on the floor as soon as it happens.  Remove soap buildup in the tub or shower regularly.  Attach bath mats securely with double-sided non-slip rug tape.  Do not have throw rugs and other things on the floor that can make you trip. What can I do in the bedroom?  Use night lights.  Make sure that you have a light by your bed that is easy to reach.  Do not use any sheets or blankets that are too big for your bed. They should not hang down onto the floor.  Have a firm chair that has side arms. You can use this for support while you get dressed.  Do not have throw rugs and other things on the floor that can make you trip. What can I do in the kitchen?  Clean up any spills right away.  Avoid walking on wet floors.  Keep items that you use a lot in easy-to-reach places.  If you need to reach something above  you, use a strong step stool that has a grab bar.  Keep electrical cords out of the way.  Do not use floor polish or wax that makes floors slippery. If you must use wax, use non-skid floor wax.  Do not have throw rugs and other things on the floor that can make you trip. What can I do with my stairs?  Do not leave any items on the stairs.  Make sure that there are handrails on both sides of the stairs and use them. Fix handrails that are broken or loose. Make sure that handrails are as long as the stairways.  Check any carpeting to make sure that it is firmly attached to the stairs. Fix any carpet that is loose or worn.  Avoid having throw rugs at the top or bottom of the stairs. If you do have throw rugs, attach them to the floor with carpet tape.  Make sure that you have a light switch at the top of the stairs and the bottom of the stairs. If you do not have them, ask someone to add them for  you. What else can I do to help prevent falls?  Wear shoes that:  Do not have high heels.  Have rubber bottoms.  Are comfortable and fit you well.  Are closed at the toe. Do not wear sandals.  If you use a stepladder:  Make sure that it is fully opened. Do not climb a closed stepladder.  Make sure that both sides of the stepladder are locked into place.  Ask someone to hold it for you, if possible.  Clearly mark and make sure that you can see:  Any grab bars or handrails.  First and last steps.  Where the edge of each step is.  Use tools that help you move around (mobility aids) if they are needed. These include:  Canes.  Walkers.  Scooters.  Crutches.  Turn on the lights when you go into a dark area. Replace any light bulbs as soon as they burn out.  Set up your furniture so you have a clear path. Avoid moving your furniture around.  If any of your floors are uneven, fix them.  If there are any pets around you, be aware of where they are.  Review your medicines with your doctor. Some medicines can make you feel dizzy. This can increase your chance of falling. Ask your doctor what other things that you can do to help prevent falls. This information is not intended to replace advice given to you by your health care provider. Make sure you discuss any questions you have with your health care provider. Document Released: 01/08/2009 Document Revised: 08/20/2015 Document Reviewed: 04/18/2014 Elsevier Interactive Patient Education  2017 [employer].

## 2016-06-29 NOTE — Progress Notes (Signed)
Patient: Diamond Lucas, Female    DOB: 19-Oct-1960, 56 y.o.   MRN: [id number] Visit Date: 06/29/2016  Today's Provider: Mar Daring, PA-C   Chief Complaint  Patient presents with  . Annual Exam   Subjective:    Annual physical exam Diamond Lucas is a 56 y.o. female who presents today for health maintenance and complete physical. She feels fairly well. She reports exercising 2 times per week. She reports she is sleeping fairly well (average 6 hours per night).  Dr. Doyne Keel [facility] - requested records today, patient completed HIPPA release. Per pharmacy records Dr. Randel Books at [facility] was who most recently prescribed patients Risperdal. This was filled on 05/30/16 but has no refills. Patient reports she is going to [place] as she is supposed to. She reports compliance with medication as well, but is still having continued delusions of bugs on her and in her skin and also mentions she still feels she is pregnant with the bat babies but is hoping they will come out because she doesn't feel them as much as she was since the procedure with Dr. Leonides Schanz.   [facility]; has not seen her in years but is who prescribed patient Finacea 15% cream for possible Rosacea.  Thyroid Carcinoma: S/P thyroidectomy. Patient refuses labs at this time, stating she is following up with [facility] on 07/21/16. She states "I will call for labs if I don't go or can't make it because of my car." -----------------------------------------------------------------   Review of Systems  Constitutional: Positive for fatigue.  HENT: Negative.   Eyes: Negative.   Respiratory: Negative.   Cardiovascular: Negative.   Gastrointestinal: Negative.   Endocrine: Negative.   Genitourinary: Negative.   Musculoskeletal: Negative.   Skin: Negative.        Itching   Allergic/Immunologic: Negative.   Neurological: Negative.   Hematological: Negative.   Psychiatric/Behavioral: Negative.      Social History      She  reports that she has never smoked. She has never used smokeless tobacco. She reports that she does not drink alcohol or use drugs.       Social History   Social History  . Marital status: Single    Spouse name: N/A  . Number of children: N/A  . Years of education: N/A   Social History Main Topics  . Smoking status: Never Smoker  . Smokeless tobacco: Never Used  . Alcohol use No  . Drug use: No  . Sexual activity: Not Currently    Birth control/ protection: Post-menopausal   Other Topics Concern  . Not on file   Social History Narrative  . No narrative on file    Past Medical History:  Diagnosis Date  . Allergy   . Arrhythmia   . History of chicken pox   . History of measles   . History of syncope   . Osteoarthritis   . Rosacea   . Schizophrenia ([place])   . Thyroid disease      Patient Active Problem List   Diagnosis Date Noted  . Postoperative hypothyroidism 05/17/2016  . Bipolar I disorder, most recent episode manic, severe with psychotic features ([place]) 02/17/2016  . Delusional disorder, somatic type ([place]) 10/07/2015  . HTN (hypertension) 10/07/2015  . Noncompliance 10/06/2015  . Rosacea 02/09/2015  . Rhinitis, allergic 12/26/2014  . 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 Reflux 09/26/2012  . Hirsutism 04/11/2011    Past Surgical History:  Procedure Laterality Date  . FOOT SURGERY Right   . THYROIDECTOMY      Family History        Family Status  Relation Status  . Mother Alive  . Other    family history of as listed  . Father Deceased at age 64   died possibly from heart failure. 12/20/2014  . Sister Alive  . Brother Alive        Her family history includes Anxiety disorder in her brother; Cancer in her other; Cataracts in her mother; Depression in her mother; Diabetes in her other; Heart disease in her other; Heart failure in her other; Kidney cancer in her  father; Stroke in her other.     Allergies  Allergen Reactions  . Diclofenac Sodium Palpitations  . Naproxen Itching, Rash and Hives  . Dust Mite Mixed Allergen Ext [Mite (D. Farinae)] Itching    Respiratory illness, and itchy eyes, eye problems  . Nsaids     Other reaction(s): Other (See Comments)  . Other Itching    Respiratory illness, and itchy eyes, eye problems Respiratory illness, and itchy eyes, eye problems     Current Outpatient Prescriptions:  .  Azelaic Acid (FINACEA) 15 % cream, Apply topically 2 (two) times daily. After skin is thoroughly washed and patted dry, gently but thoroughly massage a thin film of azelaic acid cream into the affected area twice daily, in the morning and evening., Disp: , Rfl:  .  Calcium Carbonate-Vit D-Min (CALCIUM 600+D PLUS MINERALS) 600-400 MG-UNIT TABS, Take by mouth., Disp: , Rfl:  .  cycloSPORINE (RESTASIS) 0.05 % ophthalmic emulsion, Administer 1 drop to both eyes every twelve (12) hours., Disp: , Rfl:  .  ibuprofen (ADVIL,MOTRIN) 600 MG tablet, Take 1 tablet (600 mg total) by mouth every 8 (eight) hours as needed., Disp: 30 tablet, Rfl: 0 .  ketoconazole (NIZORAL) 2 % shampoo, Apply 1 application topically 2 (two) times a week., Disp: 240 mL, Rfl: 0 .  levothyroxine (SYNTHROID, LEVOTHROID) 75 MCG tablet, TAKE 1 TABLET BY MOUTH DAILY, Disp: , Rfl:  .  mometasone (NASONEX) 50 MCG/ACT nasal spray, 1 -2 spray in each nostril daily, Disp: , Rfl:  .  Multiple Vitamin (MULTI-VITAMINS) TABS, , Disp: , Rfl:  .  PREMPRO 0.625-2.5 MG tablet, , Disp: , Rfl:  .  risperiDONE (RISPERDAL M-TABS) 2 MG disintegrating tablet, Take 1 tablet (2 mg total) by mouth at bedtime., Disp: 30 tablet, Rfl: 1   Patient Care Team: Mar Daring, PA-C as PCP - General (Physician Assistant) [name], MD as Referring Physician (Obstetrics and Gynecology) Birder Robson, MD as Referring Physician (Ophthalmology) Maceo Pro, MD as Referring Physician  (Obstetrics and Gynecology) Ralene Bathe, MD as Consulting Physician (Dermatology)      Objective:   Vitals: BP 122/66 (BP Location: Right Arm, Patient Position: Sitting, Cuff Size: Normal)   Pulse 88   Temp 98.7 F (37.1 C) (Oral)   Ht 5' (1.524 m)   Wt 113 lb (51.3 kg)   LMP 04/10/2015 Comment: Only spotting  BMI 22.07 kg/m    Vitals:   06/29/16 1402  BP: 122/66  Pulse: 88  Temp: 98.7 F (37.1 C)  TempSrc: Oral  Weight: 113 lb (51.3 kg)  Height: 5' (1.524 m)     Physical Exam  Constitutional: She is oriented to person, place, and time. She appears well-developed and well-nourished. No distress.  HENT:  Head: Normocephalic and atraumatic.  Right Ear: Hearing, tympanic membrane, external  ear and ear canal normal.  Left Ear: Hearing, tympanic membrane, external ear and ear canal normal.  Nose: Nose normal.  Mouth/Throat: Uvula is midline, oropharynx is clear and moist and mucous membranes are normal. No oropharyngeal exudate.  Eyes: Conjunctivae and EOM are normal. Pupils are equal, round, and reactive to light. Right eye exhibits no discharge. Left eye exhibits no discharge. No scleral icterus.  Neck: Normal range of motion. Neck supple. No JVD present. Carotid bruit is not present. No tracheal deviation present. No thyromegaly present.  Cardiovascular: Normal rate, regular rhythm, normal heart sounds and intact distal pulses.  Exam reveals no gallop and no friction rub.   No murmur heard. Pulmonary/Chest: Effort normal and breath sounds normal. No respiratory distress. She has no wheezes. She has no rales. She exhibits no tenderness.  Abdominal: Soft. Bowel sounds are normal. She exhibits no distension and no mass. There is no tenderness. There is no rebound and no guarding.  Musculoskeletal: Normal range of motion. She exhibits no edema or tenderness.  Lymphadenopathy:    She has no cervical adenopathy.  Neurological: She is alert and oriented to person, place, and  time.  Skin: Skin is warm and dry. No rash noted. She is not diaphoretic.  Scalp, skin folds, hands and feet examined for bug bites, any signs of larvae, burrowing, rashes, but exam was completely normal with exception of some dry, flakiness noted on the crown of scalp where she is currently having most itching. Checked nails as well since she is feeling like there is "larvae" under and in her nails. She feels she has a contagious bug on her that is resistant to all treatments.   Psychiatric: She has a normal mood and affect. Her behavior is normal. Judgment and thought content normal.  Vitals reviewed.    Depression Screen PHQ 2/9 Scores 06/29/2016  PHQ - 2 Score 1      Assessment & Plan:     Routine Health Maintenance and Physical Exam  Exercise Activities and Dietary recommendations Goals    . Exercise           Recommend more exercise. Pt to start back going to the gym 3 days a week for an hour.        Immunization History  Administered Date(s) Administered  . HPV Quadrivalent 10/02/2012  . Influenza,inj,Quad PF,36+ Mos 08/20/2012  . PPD Test 06/09/2014  . Tetanus 12/26/2005  . Varicella Zoster Immune Globulin 12/03/2014    Health Maintenance  Topic Date Due  . MAMMOGRAM  06/26/2017 (Originally 01/06/2013)  . TETANUS/TDAP  06/26/2017 (Originally 01/06/1982)  . Hepatitis C Screening  06/26/2017 (Originally 1960/06/27)  . HIV Screening  06/26/2017 (Originally 01/06/1978)  . INFLUENZA VACCINE  10/26/2016  . PAP SMEAR  08/27/2018  . COLONOSCOPY  09/30/2019     Discussed health benefits of physical activity, and encouraged her to engage in regular exercise appropriate for her age and condition.   1. Delusional disorder, somatic type ([place]) Delusion of bugs and lice on her currently. Discussed with patient that exam is normal. I try to explain to her that normally there are some clinical signs when there is a bug infestation. I explained that her symptoms are  consistent with allergies and seborrheic dermatitis of the scalp. She continues with her ideal that she has this bug infestation. She was not reassured with negative exam findings, thinking they are just deep under the skin, even stating "sometimes they can't be seen but are crawling under my skin  everywhere." She reports she is seen by [facility]. Records requested for continuity.  2. Seborrheic dermatitis Advised patient to continue ketoconazole shampoo as it does help her some until she can be seen by Dermatology.  - ketoconazole (NIZORAL) 2 % shampoo; Apply 1 application topically 2 (two) times a week.  Dispense: 240 mL; Refill: 0  3. Postoperative hypothyroidism Is scheduled to see [facility] on 07/21/16.   4. Rosacea Not active currently.   5. Bipolar I disorder, most recent episode manic, severe with psychotic features ([place]) See above medical treatment plan for #1.  --------------------------------------------------------------------    Mar Daring, PA-C  [place]

## 2016-06-29 NOTE — Patient Instructions (Signed)
Health Maintenance for Postmenopausal Women Menopause is a normal process in which your reproductive ability comes to an end. This process happens gradually over a span of months to years, usually between the ages of 33 and 38. Menopause is complete when you have missed 12 consecutive menstrual periods. It is important to talk with your health care provider about some of the most common conditions that affect postmenopausal women, such as heart disease, cancer, and bone loss (osteoporosis). Adopting a healthy lifestyle and getting preventive care can help to promote your health and wellness. Those actions can also lower your chances of developing some of these common conditions. What should I know about menopause? During menopause, you may experience a number of symptoms, such as:  Moderate-to-severe hot flashes.  Night sweats.  Decrease in sex drive.  Mood swings.  Headaches.  Tiredness.  Irritability.  Memory problems.  Insomnia. Choosing to treat or not to treat menopausal changes is an individual decision that you make with your health care provider. What should I know about hormone replacement therapy and supplements? Hormone therapy products are effective for treating symptoms that are associated with menopause, such as hot flashes and night sweats. Hormone replacement carries certain risks, especially as you become older. If you are thinking about using estrogen or estrogen with progestin treatments, discuss the benefits and risks with your health care provider. What should I know about heart disease and stroke? Heart disease, heart attack, and stroke become more likely as you age. This may be due, in part, to the hormonal changes that your body experiences during menopause. These can affect how your body processes dietary fats, triglycerides, and cholesterol. Heart attack and stroke are both medical emergencies. There are many things that you can do to help prevent heart disease  and stroke:  Have your blood pressure checked at least every 1-2 years. High blood pressure causes heart disease and increases the risk of stroke.  If you are 48-61 years old, ask your health care provider if you should take aspirin to prevent a heart attack or a stroke.  Do not use any tobacco products, including cigarettes, chewing tobacco, or electronic cigarettes. If you need help quitting, ask your health care provider.  It is important to eat a healthy diet and maintain a healthy weight.  Be sure to include plenty of vegetables, fruits, low-fat dairy products, and lean protein.  Avoid eating foods that are high in solid fats, added sugars, or salt (sodium).  Get regular exercise. This is one of the most important things that you can do for your health.  Try to exercise for at least 150 minutes each week. The type of exercise that you do should increase your heart rate and make you sweat. This is known as moderate-intensity exercise.  Try to do strengthening exercises at least twice each week. Do these in addition to the moderate-intensity exercise.  Know your numbers.Ask your health care provider to check your cholesterol and your blood glucose. Continue to have your blood tested as directed by your health care provider. What should I know about cancer screening? There are several types of cancer. Take the following steps to reduce your risk and to catch any cancer development as early as possible. Breast Cancer  Practice breast self-awareness.  This means understanding how your breasts normally appear and feel.  It also means doing regular breast self-exams. Let your health care provider know about any changes, no matter how small.  If you are 40 or older,  have a clinician do a breast exam (clinical breast exam or CBE) every year. Depending on your age, family history, and medical history, it may be recommended that you also have a yearly breast X-ray (mammogram).  If you  have a family history of breast cancer, talk with your health care provider about genetic screening.  If you are at high risk for breast cancer, talk with your health care provider about having an MRI and a mammogram every year.  Breast cancer (BRCA) gene test is recommended for women who have family members with BRCA-related cancers. Results of the assessment will determine the need for genetic counseling and BRCA1 and for BRCA2 testing. BRCA-related cancers include these types:  Breast. This occurs in males or females.  Ovarian.  Tubal. This may also be called fallopian tube cancer.  Cancer of the abdominal or pelvic lining (peritoneal cancer).  Prostate.  Pancreatic. Cervical, Uterine, and Ovarian Cancer  Your health care provider may recommend that you be screened regularly for cancer of the pelvic organs. These include your ovaries, uterus, and vagina. This screening involves a pelvic exam, which includes checking for microscopic changes to the surface of your cervix (Pap test).  For women ages 21-65, health care providers may recommend a pelvic exam and a Pap test every three years. For women ages 23-65, they may recommend the Pap test and pelvic exam, combined with testing for human papilloma virus (HPV), every five years. Some types of HPV increase your risk of cervical cancer. Testing for HPV may also be done on women of any age who have unclear Pap test results.  Other health care providers may not recommend any screening for nonpregnant women who are considered low risk for pelvic cancer and have no symptoms. Ask your health care provider if a screening pelvic exam is right for you.  If you have had past treatment for cervical cancer or a condition that could lead to cancer, you need Pap tests and screening for cancer for at least 20 years after your treatment. If Pap tests have been discontinued for you, your risk factors (such as having a new sexual partner) need to be reassessed  to determine if you should start having screenings again. Some women have medical problems that increase the chance of getting cervical cancer. In these cases, your health care provider may recommend that you have screening and Pap tests more often.  If you have a family history of uterine cancer or ovarian cancer, talk with your health care provider about genetic screening.  If you have vaginal bleeding after reaching menopause, tell your health care provider.  There are currently no reliable tests available to screen for ovarian cancer. Lung Cancer  Lung cancer screening is recommended for adults 99-83 years old who are at high risk for lung cancer because of a history of smoking. A yearly low-dose CT scan of the lungs is recommended if you:  Currently smoke.  Have a history of at least 30 pack-years of smoking and you currently smoke or have quit within the past 15 years. A pack-year is smoking an average of one pack of cigarettes per day for one year. Yearly screening should:  Continue until it has been 15 years since you quit.  Stop if you develop a health problem that would prevent you from having lung cancer treatment. Colorectal Cancer  This type of cancer can be detected and can often be prevented.  Routine colorectal cancer screening usually begins at age 72 and continues  through age 75.  If you have risk factors for colon cancer, your health care provider may recommend that you be screened at an earlier age.  If you have a family history of colorectal cancer, talk with your health care provider about genetic screening.  Your health care provider may also recommend using home test kits to check for hidden blood in your stool.  A small camera at the end of a tube can be used to examine your colon directly (sigmoidoscopy or colonoscopy). This is done to check for the earliest forms of colorectal cancer.  Direct examination of the colon should be repeated every 5-10 years until  age 75. However, if early forms of precancerous polyps or small growths are found or if you have a family history or genetic risk for colorectal cancer, you may need to be screened more often. Skin Cancer  Check your skin from head to toe regularly.  Monitor any moles. Be sure to tell your health care provider:  About any new moles or changes in moles, especially if there is a change in a mole's shape or color.  If you have a mole that is larger than the size of a pencil eraser.  If any of your family members has a history of skin cancer, especially at a young age, talk with your health care provider about genetic screening.  Always use sunscreen. Apply sunscreen liberally and repeatedly throughout the day.  Whenever you are outside, protect yourself by wearing long sleeves, pants, a wide-brimmed hat, and sunglasses. What should I know about osteoporosis? Osteoporosis is a condition in which bone destruction happens more quickly than new bone creation. After menopause, you may be at an increased risk for osteoporosis. To help prevent osteoporosis or the bone fractures that can happen because of osteoporosis, the following is recommended:  If you are 19-50 years old, get at least 1,000 mg of calcium and at least 600 mg of vitamin D per day.  If you are older than age 50 but younger than age 70, get at least 1,200 mg of calcium and at least 600 mg of vitamin D per day.  If you are older than age 70, get at least 1,200 mg of calcium and at least 800 mg of vitamin D per day. Smoking and excessive alcohol intake increase the risk of osteoporosis. Eat foods that are rich in calcium and vitamin D, and do weight-bearing exercises several times each week as directed by your health care provider. What should I know about how menopause affects my mental health? Depression may occur at any age, but it is more common as you become older. Common symptoms of depression include:  Low or sad  mood.  Changes in sleep patterns.  Changes in appetite or eating patterns.  Feeling an overall lack of motivation or enjoyment of activities that you previously enjoyed.  Frequent crying spells. Talk with your health care provider if you think that you are experiencing depression. What should I know about immunizations? It is important that you get and maintain your immunizations. These include:  Tetanus, diphtheria, and pertussis (Tdap) booster vaccine.  Influenza every year before the flu season begins.  Pneumonia vaccine.  Shingles vaccine. Your health care provider may also recommend other immunizations. This information is not intended to replace advice given to you by your health care provider. Make sure you discuss any questions you have with your health care provider. Document Released: 05/06/2005 Document Revised: 10/02/2015 Document Reviewed: 12/16/2014 Elsevier Interactive Patient   Education  2017 [employer] Inc.  

## 2016-06-29 NOTE — Progress Notes (Signed)
Subjective:   Diamond Lucas is a 56 y.o. female who presents for an Initial Medicare Annual Wellness Visit.  Review of Systems    N/A  Cardiac Risk Factors include: advanced age (>34men, >15 women);hypertension     Objective:    Today's Vitals   06/29/16 1337 06/29/16 1343  BP: 122/66   Pulse: 88   Temp: 98.7 F (37.1 C)   TempSrc: Oral   Weight: 113 lb 6.4 oz (51.4 kg)   Height: 5' (1.524 m)   PainSc: 0-No pain 0-No pain   Body mass index is 22.15 kg/m.   Current Medications (verified) Outpatient Encounter Prescriptions as of 06/29/2016  Medication Sig  . Azelaic Acid (FINACEA) 15 % cream Apply topically 2 (two) times daily. After skin is thoroughly washed and patted dry, gently but thoroughly massage a thin film of azelaic acid cream into the affected area twice daily, in the morning and evening.  . Calcium Carbonate-Vit D-Min (CALCIUM 600+D PLUS MINERALS) 600-400 MG-UNIT TABS Take by mouth.  . cyclobenzaprine (FLEXERIL) 5 MG tablet Take 1 tablet (5 mg total) by mouth at bedtime.  . cycloSPORINE (RESTASIS) 0.05 % ophthalmic emulsion Administer 1 drop to both eyes every twelve (12) hours.  Marland Kitchen ibuprofen (ADVIL,MOTRIN) 600 MG tablet Take 1 tablet (600 mg total) by mouth every 8 (eight) hours as needed.  Marland Kitchen ketoconazole (NIZORAL) 2 % shampoo Apply 1 application topically 2 (two) times a week.  . levothyroxine (SYNTHROID, LEVOTHROID) 75 MCG tablet TAKE 1 TABLET BY MOUTH DAILY  . mometasone (NASONEX) 50 MCG/ACT nasal spray 1 -2 spray in each nostril daily  . Multiple Vitamin (MULTI-VITAMINS) TABS   . risperiDONE (RISPERDAL M-TABS) 2 MG disintegrating tablet Take 1 tablet (2 mg total) by mouth at bedtime.  . hydrOXYzine (ATARAX/VISTARIL) 25 MG tablet TK 1 T PO QHS  . PREMPRO 0.625-2.5 MG tablet   . Spinosad 0.9 % SUSP Apply to hair and scalp, do not put on face, leave on for 10 minutes and wash out. No need to repeat.   No facility-administered encounter medications on file  as of 06/29/2016.     Allergies (verified) Diclofenac sodium; Naproxen; Dust mite mixed allergen ext [mite (d. farinae)]; Nsaids; and Other   History: Past Medical History:  Diagnosis Date  . Allergy   . Arrhythmia   . History of chicken pox   . History of measles   . History of syncope   . Osteoarthritis   . Rosacea   . Schizophrenia ([place])   . Thyroid disease    Past Surgical History:  Procedure Laterality Date  . FOOT SURGERY Right   . THYROIDECTOMY     Family History  Problem Relation Age of Onset  . Depression Mother   . Cataracts Mother   . Cancer Other   . Heart failure Other   . Heart disease Other   . Stroke Other   . Diabetes Other   . Kidney cancer Father   . Anxiety disorder Brother    Social History   Occupational History  . Not on file.   Social History Main Topics  . Smoking status: Never Smoker  . Smokeless tobacco: Never Used  . Alcohol use No  . Drug use: No  . Sexual activity: Not Currently    Birth control/ protection: Post-menopausal    Tobacco Counseling Counseling given: Not Answered   Activities of Daily Living In your present state of health, do you have any difficulty performing the following activities: 06/29/2016  Hearing? N  Vision? Y  Difficulty concentrating or making decisions? N  Walking or climbing stairs? N  Dressing or bathing? N  Doing errands, shopping? N  Preparing Food and eating ? N  Using the Toilet? N  In the past six months, have you accidently leaked urine? N  Do you have problems with loss of bowel control? N  Managing your Medications? N  Managing your Finances? N  Housekeeping or managing your Housekeeping? N  Some encounter information is confidential and restricted. Go to Review Flowsheets activity to see all data.  Some recent data might be hidden    Immunizations and Health Maintenance Immunization History  Administered Date(s) Administered  . HPV Quadrivalent 10/02/2012  . Influenza,inj,Quad  PF,36+ Mos 08/20/2012  . PPD Test 06/09/2014  . Tetanus 12/26/2005  . Varicella Zoster Immune Globulin 12/03/2014   There are no preventive care reminders to display for this patient.  Patient Care Team: Mar Daring, PA-C as PCP - General (Physician Assistant) [name], MD as Referring Physician (Obstetrics and Gynecology) Birder Robson, MD as Referring Physician (Ophthalmology) Maceo Pro, MD as Referring Physician (Obstetrics and Gynecology) Ralene Bathe, MD as Consulting Physician (Dermatology)  Indicate any recent Medical Services you may have received from other than [facility] providers in the past year (date may be approximate).     Assessment:   This is a routine wellness examination for [place].   Hearing/Vision screen Vision Screening Comments: Pt sees Dr George Ina for vision checks once yearly.   Dietary issues and exercise activities discussed: Current Exercise Habits: Home exercise routine, Type of exercise: walking, Time (Minutes): 20, Frequency (Times/Week): 2, Weekly Exercise (Minutes/Week): 40, Intensity: Mild  Goals    . Exercise           Recommend more exercise. Pt to start back going to the gym 3 days a week for an hour.       Depression Screen PHQ 2/9 Scores 06/29/2016  PHQ - 2 Score 1    Fall Risk Fall Risk  06/29/2016  Falls in the past year? Yes  Number falls in past yr: 1  Injury with Fall? Yes  Risk for fall due to : Medication side effect  Risk for fall due to (comments): due to medication given in hospital  Follow up Falls prevention discussed    Cognitive Function:     6CIT Screen 06/29/2016  What Year? 0 points  What month? 0 points  What time? 0 points  Count back from 20 0 points  Months in reverse 0 points  Repeat phrase 0 points  Total Score 0    Screening Tests Health Maintenance  Topic Date Due  . MAMMOGRAM  06/26/2017 (Originally 01/06/2013)  . TETANUS/TDAP  06/26/2017 (Originally 01/06/1982)  . Hepatitis C  Screening  06/26/2017 (Originally Mar 20, 1961)  . HIV Screening  06/26/2017 (Originally 01/06/1978)  . INFLUENZA VACCINE  10/26/2016  . PAP SMEAR  08/27/2018  . COLONOSCOPY  09/30/2019      Plan:  I have personally reviewed and addressed the Medicare Annual Wellness questionnaire and have noted the following in the patient's chart:  A. Medical and social history B. Use of alcohol, tobacco or illicit drugs  C. Current medications and supplements D. Functional ability and status E.  Nutritional status F.  Physical activity G. Advance directives H. List of other physicians I.  Hospitalizations, surgeries, and ER visits in previous 12 months J.  [place] such as hearing and vision if needed,  cognitive and depression L. Referrals and appointments - none  In addition, I have reviewed and discussed with patient certain preventive protocols, quality metrics, and best practice recommendations. A written personalized care plan for preventive services as well as general preventive health recommendations were provided to patient.  See attached scanned questionnaire for additional information.   Signed,  Fabio Neighbors, LPN Nurse Health Advisor   MD Recommendations: Pt declined tdap, Hepatitis C screening, HIV screening and a mammogram.  I have reviewed the documentation and information obtained by Fabio Neighbors, LPN in the above chart and agree as above. I was available for consultation if any questions or issues arose.  Fenton Malling, PA-C

## 2016-07-04 ENCOUNTER — Telehealth: Payer: Self-pay | Admitting: Physician Assistant

## 2016-07-04 NOTE — Telephone Encounter (Signed)
Is there soemthing she can use either inher nose or orally? ED

## 2016-07-04 NOTE — Telephone Encounter (Signed)
Will defer this to you ED

## 2016-07-04 NOTE — Telephone Encounter (Signed)
Please tell patient to not put the medication up her nose. This is not safe to do.

## 2016-07-04 NOTE — Telephone Encounter (Signed)
Pt called saying she now has the lice in her nose.  She has been putting the lice medication up her nose with a q tip but she don't think that's taking care of it.  Please advice  Thanks Con Memos

## 2016-07-05 NOTE — Telephone Encounter (Signed)
Pt stated she was calling back to be advised of what she can use in her nose b/c the "lice are in her nose hair and eating the cartilage of her nose." Pt stated that she used Tea Tree Oil but was advised not to put that in her nose. Please advise. Thanks TNP

## 2016-07-06 NOTE — Telephone Encounter (Signed)
She can use tea tree oil in her nose and she can use sweet oil as wel. She can put on a q tip and rub in her nasal passageways for itching. I do not want her to use lice medication in her nose.

## 2016-07-06 NOTE — Telephone Encounter (Signed)
Patient advised as directed below.  Thanks,  -[name] 

## 2016-07-06 NOTE — Telephone Encounter (Signed)
lmtcb  Thanks,  -Kimberlly Norgard

## 2016-07-08 ENCOUNTER — Ambulatory Visit (INDEPENDENT_AMBULATORY_CARE_PROVIDER_SITE_OTHER): Payer: Medicare Other | Admitting: Physician Assistant

## 2016-07-08 ENCOUNTER — Encounter: Payer: Self-pay | Admitting: Physician Assistant

## 2016-07-08 VITALS — BP 118/80 | HR 82 | Temp 97.7°F | Resp 16 | Wt 115.2 lb

## 2016-07-08 DIAGNOSIS — L309 Dermatitis, unspecified: Secondary | ICD-10-CM | POA: Diagnosis not present

## 2016-07-08 DIAGNOSIS — L299 Pruritus, unspecified: Secondary | ICD-10-CM

## 2016-07-08 DIAGNOSIS — H1013 Acute atopic conjunctivitis, bilateral: Secondary | ICD-10-CM

## 2016-07-08 MED ORDER — HYDROXYZINE HCL 25 MG PO TABS: 25.0000 mg | ORAL_TABLET | Freq: Every evening | ORAL | 0 refills | Status: DC | PRN

## 2016-07-08 MED ORDER — TRIAMCINOLONE ACETONIDE 0.1 % EX CREA: 1.0000 "application " | TOPICAL_CREAM | Freq: Two times a day (BID) | CUTANEOUS | 0 refills | Status: DC

## 2016-07-08 NOTE — Patient Instructions (Signed)
Pruritus Pruritus is an itching feeling. There are many different conditions and factors that can make your skin itchy. Dry skin is one of the most common causes of itching. Most cases of itching do not require medical attention. Itchy skin can turn into a rash. Follow these instructions at home: Watch your pruritus for any changes. Take these steps to help with your condition: Skin Care   Moisturize your skin as needed. A moisturizer that contains petroleum jelly is best for keeping moisture in your skin.  Take or apply medicines only as directed by your health care provider. This may include:  Corticosteroid cream.  Anti-itch lotions.  Oral anti-histamines.  Apply cool compresses to the affected areas.  Try taking a bath with:  Epsom salts. Follow the instructions on the packaging. You can get these at your local pharmacy or grocery store.  Baking soda. Pour a small amount into the bath as directed by your health care provider.  Colloidal oatmeal. Follow the instructions on the packaging. You can get this at your local pharmacy or grocery store.  Try applying baking soda paste to your skin. Stir water into baking soda until it reaches a paste-like consistency.  Do not scratch your skin.  Avoid hot showers or baths, which can make itching worse. A cold shower may help with itching as long as you use a moisturizer after.  Avoid scented soaps, detergents, and perfumes. Use gentle soaps, detergents, perfumes, and other cosmetic products. General instructions   Avoid wearing tight clothes.  Keep a journal to help track what causes your itch. Write down:  What you eat.  What cosmetic products you use.  What you drink.  What you wear. This includes jewelry.  Use a humidifier. This keeps the air moist, which helps to prevent dry skin. Contact a health care provider if:  The itching does not go away after several days.  You sweat at night.  You have weight loss.  You  are unusually thirsty.  You urinate more than normal.  You are more tired than normal.  You have abdominal pain.  Your skin tingles.  You feel weak.  Your skin or the whites of your eyes look yellow (jaundice).  Your skin feels numb. This information is not intended to replace advice given to you by your health care provider. Make sure you discuss any questions you have with your health care provider. Document Released: 11/24/2010 Document Revised: 08/20/2015 Document Reviewed: 03/10/2014 Elsevier Interactive Patient Education  2017 [employer]  

## 2016-07-08 NOTE — Progress Notes (Signed)
Patient: Diamond Lucas Female    DOB: 01/29/1961   56 y.o.   MRN: [id number] Visit Date: 07/08/2016  Today's Provider: Mar Daring, PA-C   Chief Complaint  Patient presents with  . Crab Lice   Subjective:    HPI Patient here today with the same c/o of crab lice. She reports that she noticed one on her abdomen and tried to take it out with a tweezers but unable to because it went in. She was only able to take out the hair follicle. She also reports that there is a spot near the area where the other louse entered that looks like a baby eye. She reports she needs another type of treatment because the "crab lice" are going in her nose,vagina,and anus. She also has a "rash" under her eyes. She reports using Nix for several weeks on her pubic hair every night.    Seeing people with lice on their heads in her neighborhood.   She does have delusional disorder and when she discontinues her medication she starts with delusions of bugs that progress. She has not filled her Risperdal since early march and this had no refills. I have requested records from [facility] but have still not received any records thus I do not know if she is following up as she is supposed to with them.   She was also referred to Dermatology. She has seen Dr. Nehemiah Massed and states not much was done. I have not seen this record yet.   She is also scheduled to follow up her surgically induced hypothyroidism at [facility] in [place] on 07/21/16 and reports she is still planning to go. Refuses other labs today.     Allergies  Allergen Reactions  . Diclofenac Sodium Palpitations  . Naproxen Itching, Rash and Hives  . Dust Mite Mixed Allergen Ext [Mite (D. Farinae)] Itching    Respiratory illness, and itchy eyes, eye problems  . Nsaids     Other reaction(s): Other (See Comments)  . Other Itching    Respiratory illness, and itchy eyes, eye problems Respiratory illness, and itchy eyes, eye problems      Current Outpatient Prescriptions:  .  Calcium Carbonate-Vit D-Min (CALCIUM 600+D PLUS MINERALS) 600-400 MG-UNIT TABS, Take by mouth., Disp: , Rfl:  .  cycloSPORINE (RESTASIS) 0.05 % ophthalmic emulsion, Administer 1 drop to both eyes every twelve (12) hours., Disp: , Rfl:  .  ibuprofen (ADVIL,MOTRIN) 600 MG tablet, Take 1 tablet (600 mg total) by mouth every 8 (eight) hours as needed., Disp: 30 tablet, Rfl: 0 .  ketoconazole (NIZORAL) 2 % shampoo, Apply 1 application topically 2 (two) times a week., Disp: 240 mL, Rfl: 0 .  levothyroxine (SYNTHROID, LEVOTHROID) 75 MCG tablet, TAKE 1 TABLET BY MOUTH DAILY, Disp: , Rfl:  .  mometasone (NASONEX) 50 MCG/ACT nasal spray, 1 -2 spray in each nostril daily, Disp: , Rfl:  .  Multiple Vitamin (MULTI-VITAMINS) TABS, , Disp: , Rfl:  .  risperiDONE (RISPERDAL M-TABS) 2 MG disintegrating tablet, Take 1 tablet (2 mg total) by mouth at bedtime., Disp: 30 tablet, Rfl: 1 .  Azelaic Acid (FINACEA) 15 % cream, Apply topically 2 (two) times daily. After skin is thoroughly washed and patted dry, gently but thoroughly massage a thin film of azelaic acid cream into the affected area twice daily, in the morning and evening., Disp: , Rfl:  .  PREMPRO 0.625-2.5 MG tablet, , Disp: , Rfl:   Review of Systems  Constitutional: Negative.   Respiratory: Negative.   Cardiovascular: Negative.   Gastrointestinal: Negative.   Skin: Positive for rash.  Neurological: Negative.     Social History  Substance Use Topics  . Smoking status: Never Smoker  . Smokeless tobacco: Never Used  . Alcohol use No   Objective:   BP 118/80 (BP Location: Right Arm, Patient Position: Sitting, Cuff Size: Normal)   Pulse 82   Temp 97.7 F (36.5 C) (Oral)   Resp 16   Wt 115 lb 3.2 oz (52.3 kg)   LMP 04/10/2015 Comment: Only spotting  BMI 22.50 kg/m    Physical Exam  Constitutional: She appears well-developed and well-nourished. No distress.  Cardiovascular: Normal rate, regular  rhythm and normal heart sounds.  Exam reveals no gallop and no friction rub.   No murmur heard. Pulmonary/Chest: Effort normal and breath sounds normal. No respiratory distress. She has no wheezes. She has no rales.  Skin: She is not diaphoretic.     Vitals reviewed.      Assessment & Plan:     1. Eczema, unspecified type Triamcinolone given as below for itching and irritation. Advised to not put on or in eye. She voices understanding. Advised her to stop using OTC medications for lice as these are irritating her skin and will cause more itching. Hydroxyzine given as below for nighttime itching and to hopefully help her sleep some. She is to call if symptoms worsen. - triamcinolone cream (KENALOG) 0.1 %; Apply 1 application topically 2 (two) times daily.  Dispense: 30 g; Refill: 0  2. Itching See above medical treatment plan. - hydrOXYzine (ATARAX/VISTARIL) 25 MG tablet; Take 1 tablet (25 mg total) by mouth at bedtime as needed.  Dispense: 30 tablet; Refill: 0  3. Allergic conjunctivitis of both eyes Suspect eczematous dermatic changes under the eyes from seasonal allergies. Advised to add OTC allergy eye drops to help with itching.        Mar Daring, PA-C  [facility]

## 2016-07-19 DIAGNOSIS — F4323 Adjustment disorder with mixed anxiety and depressed mood: Secondary | ICD-10-CM | POA: Diagnosis not present

## 2016-07-21 DIAGNOSIS — E890 Postprocedural hypothyroidism: Secondary | ICD-10-CM | POA: Diagnosis not present

## 2016-07-21 DIAGNOSIS — Z79899 Other long term (current) drug therapy: Secondary | ICD-10-CM | POA: Diagnosis not present

## 2016-07-21 DIAGNOSIS — C73 Malignant neoplasm of thyroid gland: Secondary | ICD-10-CM | POA: Diagnosis not present

## 2016-07-22 ENCOUNTER — Telehealth: Payer: Self-pay | Admitting: Physician Assistant

## 2016-07-22 NOTE — Telephone Encounter (Signed)
Pt called saying the cream that you prescribed last time worked ok but she has another crab lice and wants to know is you will prescribe permethrin cream 5%  [facility] [address]  Thank sTeri

## 2016-07-22 NOTE — Telephone Encounter (Signed)
Please review-aa 

## 2016-07-25 NOTE — Telephone Encounter (Signed)
On the multiple exams there has never been any lice or nits noted. I do not feel she would benefit from Permethrin cream. I would rather her continue the treatment she is currently using at this time since it is helping.

## 2016-07-26 NOTE — Telephone Encounter (Signed)
There has been no sign of lice or nits. She has also seen a dermatologist and they did not see any lice or nits either. The medications she is requesting can be irritative to the skin and make symptoms worse.  What health care is she requesting at this time? Also it looks as if she did not f/u with the endocrinologist in [place]. If she did not I would recommend [place] checking her labs for her thyroid.   She should also address her concerns with [facility]. They are suppose to be following her for psychiatry as well as filling her medication for risperdal.

## 2016-07-26 NOTE — Telephone Encounter (Signed)
Patient was advised as directed below. Per patient she still needs something stronger. Patient also stated that she is not getting all the health need that she should be getting. I asked if she was seeing her counselor and she reports that she is and that they only talk to her about psychiatrist problems but that she doesn't get the medications from them. She also stated that she is not getting the medication from [facility].   Please Review.  Thanks,  -Tena Linebaugh

## 2016-07-28 ENCOUNTER — Telehealth: Payer: Self-pay | Admitting: Obstetrics and Gynecology

## 2016-07-28 DIAGNOSIS — D229 Melanocytic nevi, unspecified: Secondary | ICD-10-CM | POA: Diagnosis not present

## 2016-07-28 DIAGNOSIS — L219 Seborrheic dermatitis, unspecified: Secondary | ICD-10-CM | POA: Diagnosis not present

## 2016-07-28 DIAGNOSIS — R21 Rash and other nonspecific skin eruption: Secondary | ICD-10-CM | POA: Diagnosis not present
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
## 2016-07-28 NOTE — Telephone Encounter (Signed)
LMTCB  Thanks,  -[name] 

## 2016-07-28 NOTE — Telephone Encounter (Signed)
Spoke with patient as directed below. As soon as I started to talk to patient she started to talk over me and saying that according to [facility] rules that a PA didn't have the privilege to admit a patient to the hospital only a MD and if Tawanna Sat is not able to give her the medications that she has consider to look for an MD but still wants to see Tawanna Sat in between. Advise patient that if she was looking for an MD not in the facility that she couldn't continue to see Tawanna Sat in between. Per patient she wants to see Dr. Venia Minks. Advised patient that Dr. Venia Minks is no longer in the practice. Per patient she still going to continue seeing Tawanna Sat until she finds a female doctor because she doesn't want a female doctor. She also stated that she did go see the Endocrionologist in April and hung up.  Thanks,  -Ameya Kutz

## 2016-07-28 NOTE — Telephone Encounter (Signed)
Noted  

## 2016-07-29 DIAGNOSIS — F458 Other somatoform disorders: Secondary | ICD-10-CM | POA: Diagnosis not present

## 2016-07-29 NOTE — Telephone Encounter (Signed)
Please inform patient that I understand that she was seen at [facility] recently.  I will need to coordinate with Dr. Leonides Schanz regarding her recent.

## 2016-08-08 NOTE — Telephone Encounter (Signed)
Patient called and states that she needs the referral to the wildlife specialist as Dr Marcelline Mates told her that her case is sensitive and she would need to see them to remove the bats due to the semen in her apartment.  Please call

## 2016-08-15 NOTE — Telephone Encounter (Signed)
Please inform patient that unfortunately I do not know any wildlife specialists in the area.   Dr. Marcelline Mates

## 2016-08-18 DIAGNOSIS — H1013 Acute atopic conjunctivitis, bilateral: Secondary | ICD-10-CM | POA: Diagnosis not present

## 2016-08-23 ENCOUNTER — Telehealth: Payer: Self-pay | Admitting: Physician Assistant

## 2016-08-23 DIAGNOSIS — F4323 Adjustment disorder with mixed anxiety and depressed mood: Secondary | ICD-10-CM | POA: Diagnosis not present

## 2016-08-23 NOTE — Telephone Encounter (Signed)
Her eyes are itching.  She thinks it is the lice.  She said she went to the eye dr and he gave her allergy eye Rx.  She wants the 5% perimetric cream  She also needs a refill on her medication for rosacea,  Griseofulvin ointment  She uses [facility] [address]  Thanks Con Memos

## 2016-08-23 NOTE — Telephone Encounter (Signed)
Please Review.  Thanks,  -Mycal Conde 

## 2016-08-24 MED ORDER — AZELAIC ACID 15 % EX GEL: CUTANEOUS | 1 refills | Status: DC

## 2016-08-24 NOTE — Telephone Encounter (Signed)
I would recommend her using allergy eye drops as well or the restasis she has had in the past to see if this helps. It is not recommended to use permethrin cream on the eyes.   Also griseofulvin ointment is normally for fungal infections such as ring worm not rosacea. We normally use metronidazole for rosacea. She has had Finacea in the past from dermatology for her rosacea. I can refill that for her.

## 2016-08-25 ENCOUNTER — Telehealth: Payer: Self-pay

## 2016-08-25 NOTE — Telephone Encounter (Signed)
LMTCB  Thanks,  -[name] 

## 2016-08-25 NOTE — Telephone Encounter (Signed)
Please review. KW 

## 2016-08-25 NOTE — Telephone Encounter (Signed)
No answer

## 2016-08-25 NOTE — Telephone Encounter (Signed)
I have already sent in the [place] which she has used in the past for her rosacea.   She can call if that does not help and I can send in metronidazole then.

## 2016-08-25 NOTE — Telephone Encounter (Signed)
Spoke with patient and per patient she is willing to try the Metronidazole. She reports that she has samples of the "crab lice" if provider wants to see them. She states that she is using the tea tree oil on her pubic area and is not helping and wants to know if you can prescribed something for her, but that she actually have the "crab lice" and she can see them with out a microscope.  Thanks,  -[name]

## 2016-08-29 DIAGNOSIS — H01001 Unspecified blepharitis right upper eyelid: Secondary | ICD-10-CM | POA: Diagnosis not present

## 2016-09-02 DIAGNOSIS — S90121A Contusion of right lesser toe(s) without damage to nail, initial encounter: Secondary | ICD-10-CM | POA: Diagnosis not present

## 2016-09-02 DIAGNOSIS — S9031XA Contusion of right foot, initial encounter: Secondary | ICD-10-CM | POA: Diagnosis not present

## 2016-09-02 DIAGNOSIS — L600 Ingrowing nail: Secondary | ICD-10-CM | POA: Diagnosis not present

## 2016-09-08 ENCOUNTER — Emergency Department
Admission: EM | Admit: 2016-09-08 | Discharge: 2016-09-08 | Disposition: A | Discharge status: Home / Self Care | Payer: Medicare Other | Attending: Emergency Medicine | Admitting: Emergency Medicine

## 2016-09-08 ENCOUNTER — Encounter: Payer: Self-pay | Admitting: Emergency Medicine

## 2016-09-08 DIAGNOSIS — Z79899 Other long term (current) drug therapy: Secondary | ICD-10-CM | POA: Diagnosis not present

## 2016-09-08 DIAGNOSIS — B850 Pediculosis due to Pediculus humanus capitis: Secondary | ICD-10-CM | POA: Diagnosis present

## 2016-09-08 DIAGNOSIS — I10 Essential (primary) hypertension: Secondary | ICD-10-CM | POA: Diagnosis not present

## 2016-09-08 DIAGNOSIS — E890 Postprocedural hypothyroidism: Secondary | ICD-10-CM | POA: Diagnosis not present

## 2016-09-08 DIAGNOSIS — F459 Somatoform disorder, unspecified: Secondary | ICD-10-CM | POA: Insufficient documentation

## 2016-09-08 DIAGNOSIS — F22 Delusional disorders: Secondary | ICD-10-CM

## 2016-09-08 MED ORDER — PERMETHRIN 5 % EX CREA: TOPICAL_CREAM | CUTANEOUS | 0 refills | Status: DC

## 2016-09-08 NOTE — Discharge Instructions (Signed)
Your exam is normal today. You have used the appropriate OTC treatments for head lice. I suggest you avoid using hydrogen peroxide, as it is causing damage to your hair & nails. Avoid using the OTC or prescription treatments more than every 10 days. Follow-up with your provider, your symptoms may be more representative of a condition of a perceived infestation, rather than a actual or confirmed infestation. Follow-up with Dr. Marlyn Corporal and [facility] for recheck. Consider referral to  [facility] to determine if medical treatment is warranted.
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
## 2016-09-08 NOTE — ED Triage Notes (Signed)
Brought over from [facility] for evaluation of head lice  States she has used several treatments w/o success   Feels like they are in her scalp

## 2016-09-08 NOTE — ED Notes (Signed)
See triage note  States she has been treating self for head/body lice for about 2 years  States she has had success with pubic area but not her head.. Last shampoo use was last pm  This am thought "they were gone" but felt something crawling in scalp  So she sprayed her head with lice spray again pta

## 2016-09-12 DIAGNOSIS — H01001 Unspecified blepharitis right upper eyelid: Secondary | ICD-10-CM | POA: Diagnosis not present

## 2016-09-15 ENCOUNTER — Encounter: Payer: Self-pay | Admitting: Physician Assistant

## 2016-09-15 ENCOUNTER — Ambulatory Visit (INDEPENDENT_AMBULATORY_CARE_PROVIDER_SITE_OTHER): Payer: Medicare Other | Admitting: Physician Assistant

## 2016-09-15 VITALS — BP 102/70 | HR 69 | Temp 97.8°F | Resp 16 | Wt 115.2 lb

## 2016-09-15 DIAGNOSIS — F22 Delusional disorders: Secondary | ICD-10-CM | POA: Insufficient documentation

## 2016-09-15 DIAGNOSIS — L309 Dermatitis, unspecified: Secondary | ICD-10-CM

## 2016-09-15 MED ORDER — TRIAMCINOLONE ACETONIDE 0.1 % EX CREA: 1.0000 "application " | TOPICAL_CREAM | Freq: Two times a day (BID) | CUTANEOUS | 0 refills | Status: DC

## 2016-09-15 NOTE — Progress Notes (Signed)
Patient: Diamond Lucas Female    DOB: 1960-06-19   56 y.o.   MRN: [id number] Visit Date: 09/15/2016  Today's Provider: Mar Daring, PA-C   Chief Complaint  Patient presents with  . Follow-up    ED and to check scalp   Subjective:    HPI   Follow Up ER Visit  Patient is here for ER follow up.  She was recently seen at [facility] for Delusion of infestation on 09/08/2016. Treatment for this included Permethrin 5% She reports excellent compliance with treatment. She reports this condition is Improved.  She reports treatment was excellent but that this morning she felt like something flew to her right eyelid. She was advised to go to [place] but per patient she tried to call Dr. Carmie End with [facility] and was not able, she called [facility] and per patient she is scheduled to go next week. She is also scheduled to see Manuela Schwartz, counselor this Tuesday. ------------------------------------------------------------------------------------ Patient is also requesting a refill on Kenalog cream.     Allergies  Allergen Reactions  . Diclofenac Sodium Palpitations  . Naproxen Itching, Rash and Hives  . Olanzapine     Other reaction(s): Other Cross-eyed and hyperventilation  . Dust Mite Mixed Allergen Ext [Mite (D. Farinae)] Itching    Respiratory illness, and itchy eyes, eye problems  . Nsaids     Other reaction(s): Other (See Comments)  . Other Itching    Respiratory illness, and itchy eyes, eye problems Respiratory illness, and itchy eyes, eye problems     Current Outpatient Prescriptions:  .  Calcium Carbonate-Vit D-Min (CALCIUM 600+D PLUS MINERALS) 600-400 MG-UNIT TABS, Take by mouth., Disp: , Rfl:  .  cycloSPORINE (RESTASIS) 0.05 % ophthalmic emulsion, Administer 1 drop to both eyes every twelve (12) hours., Disp: , Rfl:  .  ibuprofen (ADVIL,MOTRIN) 600 MG tablet, Take 1 tablet (600 mg total) by mouth every 8 (eight) hours as needed., Disp: 30 tablet, Rfl: 0 .   levothyroxine (SYNTHROID, LEVOTHROID) 75 MCG tablet, TAKE 1 TABLET BY MOUTH DAILY, Disp: , Rfl:  .  Multiple Vitamin (MULTI-VITAMINS) TABS, , Disp: , Rfl:  .  permethrin (ELIMITE) 5 % cream, Apply from neck to feet x 1 Rinse after 24 hours., Disp: 60 g, Rfl: 0 .  PREMPRO 0.625-2.5 MG tablet, , Disp: , Rfl:  .  risperiDONE (RISPERDAL M-TABS) 2 MG disintegrating tablet, Take 1 tablet (2 mg total) by mouth at bedtime., Disp: 30 tablet, Rfl: 1 .  triamcinolone cream (KENALOG) 0.1 %, Apply 1 application topically 2 (two) times daily., Disp: 30 g, Rfl: 0 .  hydrOXYzine (ATARAX/VISTARIL) 25 MG tablet, Take 1 tablet (25 mg total) by mouth at bedtime as needed. (Patient not taking: Reported on 09/15/2016), Disp: 30 tablet, Rfl: 0 .  ketoconazole (NIZORAL) 2 % shampoo, Apply 1 application topically 2 (two) times a week. (Patient not taking: Reported on 09/15/2016), Disp: 240 mL, Rfl: 0 .  mometasone (NASONEX) 50 MCG/ACT nasal spray, 1 -2 spray in each nostril daily, Disp: , Rfl:   Review of Systems  Constitutional: Negative.   Respiratory: Negative.   Cardiovascular: Negative.   Gastrointestinal: Negative.   Skin: Positive for rash.  Psychiatric/Behavioral:       Delusions    Social History  Substance Use Topics  . Smoking status: Never Smoker  . Smokeless tobacco: Never Used  . Alcohol use No   Objective:   BP 102/70 (BP Location: Right Arm, Patient Position: Sitting, Cuff  Size: Normal)   Pulse 69   Temp 97.8 F (36.6 C) (Oral)   Resp 16   Wt 115 lb 3.2 oz (52.3 kg)   LMP 04/10/2015 Comment: Only spotting  BMI 20.41 kg/m    Physical Exam  Constitutional: She appears well-developed and well-nourished. No distress.  Neck: Normal range of motion. Neck supple.  Cardiovascular: Normal rate, regular rhythm and normal heart sounds.  Exam reveals no gallop and no friction rub.   No murmur heard. Pulmonary/Chest: Effort normal and breath sounds normal. No respiratory distress. She has no  wheezes. She has no rales.  Skin: No rash noted. She is not diaphoretic.  Vitals reviewed.      Assessment & Plan:     1. Eczema, unspecified type Stable. Diagnosis pulled for medication refill. Continue current medical treatment plan. - triamcinolone cream (KENALOG) 0.1 %; Apply 1 application topically 2 (two) times daily.  Dispense: 30 g; Refill: 0  2. Delusion of infestation ([place]) Patient was treated with permethrin 5% at [facility] ER. She was told to contact [facility] but she was unable to get in contact with anyone. She feels well currently and is not having lice delusions at this time. She did report feeling a bug crawl on her right eye today while at a garage with her car, which was outdoors. Her eye was examined today and she was reassured there was nothing in her eye and she seemed to believe me, which is an improvement to previous delusions where she did not believe when she was told that something was not there. She is scheduled to see her counselor next week at [facility].   3. Delusional disorder, somatic type ([place]) See above medical treatment plan.       Mar Daring, PA-C  [facility]

## 2016-10-06 ENCOUNTER — Ambulatory Visit (INDEPENDENT_AMBULATORY_CARE_PROVIDER_SITE_OTHER): Payer: Medicare Other | Admitting: Physician Assistant

## 2016-10-06 ENCOUNTER — Encounter: Payer: Self-pay | Admitting: Physician Assistant

## 2016-10-06 VITALS — BP 110/70 | HR 75 | Temp 97.8°F | Resp 16 | Wt 112.0 lb

## 2016-10-06 DIAGNOSIS — B369 Superficial mycosis, unspecified: Secondary | ICD-10-CM

## 2016-10-06 MED ORDER — NYSTATIN 100000 UNIT/GM EX OINT: 1.0000 "application " | TOPICAL_OINTMENT | Freq: Two times a day (BID) | CUTANEOUS | 0 refills | Status: DC

## 2016-10-06 NOTE — Progress Notes (Signed)
Patient: Diamond Lucas Female    DOB: 17-May-1960   56 y.o.   MRN: [id number] Visit Date: 10/06/2016  Today's Provider: Mar Daring, PA-C   Chief Complaint  Patient presents with  . Rash   Subjective:    HPI  Patient here today C/O of lesion on her buttock right above the anus. Patient reports itching and denies any fever or discharge. Patient reports using OTC antibacterial cream. Patient denies any injuries or similar symptoms or lesion any where else. She still reports that she feels it is from crab lice. Patient denies itching at any other location including any areas with hair or genitally.      Allergies  Allergen Reactions  . Diclofenac Sodium Palpitations  . Naproxen Itching, Rash and Hives  . Olanzapine     Other reaction(s): Other Cross-eyed and hyperventilation  . Dust Mite Mixed Allergen Ext [Mite (D. Farinae)] Itching    Respiratory illness, and itchy eyes, eye problems  . Nsaids     Other reaction(s): Other (See Comments)  . Other Itching    Respiratory illness, and itchy eyes, eye problems Respiratory illness, and itchy eyes, eye problems     Current Outpatient Prescriptions:  .  Calcium Carbonate-Vit D-Min (CALCIUM 600+D PLUS MINERALS) 600-400 MG-UNIT TABS, Take by mouth., Disp: , Rfl:  .  cycloSPORINE (RESTASIS) 0.05 % ophthalmic emulsion, Administer 1 drop to both eyes every twelve (12) hours., Disp: , Rfl:  .  ibuprofen (ADVIL,MOTRIN) 600 MG tablet, Take 1 tablet (600 mg total) by mouth every 8 (eight) hours as needed., Disp: 30 tablet, Rfl: 0 .  levothyroxine (SYNTHROID, LEVOTHROID) 75 MCG tablet, TAKE 1 TABLET BY MOUTH DAILY, Disp: , Rfl:  .  Multiple Vitamin (MULTI-VITAMINS) TABS, , Disp: , Rfl:  .  PREMPRO 0.625-2.5 MG tablet, , Disp: , Rfl:  .  risperiDONE (RISPERDAL M-TABS) 2 MG disintegrating tablet, Take 1 tablet (2 mg total) by mouth at bedtime., Disp: 30 tablet, Rfl: 1 .  triamcinolone cream (KENALOG) 0.1 %, Apply 1  application topically 2 (two) times daily., Disp: 30 g, Rfl: 0  Review of Systems  Constitutional: Negative.   Respiratory: Negative.   Cardiovascular: Negative.   Gastrointestinal: Negative.   Skin: Positive for rash.    Social History  Substance Use Topics  . Smoking status: Never Smoker  . Smokeless tobacco: Never Used  . Alcohol use No   Objective:   BP 110/70 (BP Location: Left Arm, Patient Position: Sitting, Cuff Size: Normal)   Pulse 75   Temp 97.8 F (36.6 C) (Oral)   Resp 16   Wt 112 lb (50.8 kg)   LMP 04/10/2015 Comment: Only spotting  SpO2 97%   BMI 19.84 kg/m  Vitals:   10/06/16 1119  BP: 110/70  Pulse: 75  Resp: 16  Temp: 97.8 F (36.6 C)  TempSrc: Oral  SpO2: 97%  Weight: 112 lb (50.8 kg)     Physical Exam  Constitutional: She appears well-developed and well-nourished. No distress.  Cardiovascular: Normal rate, regular rhythm and normal heart sounds.  Exam reveals no gallop and no friction rub.   No murmur heard. Pulmonary/Chest: Effort normal and breath sounds normal. No respiratory distress. She has no wheezes. She has no rales.  Skin: Lesion noted. She is not diaphoretic.     Vitals reviewed.       Assessment & Plan:     1. Fungal infection of skin Suspect fungal infection of the gluteal cleft.  Advised patient to make sure to keep area clean and dry. Avoid scratching. Nystatin cream given for treatment. She is to call if no improvement.  - nystatin ointment (MYCOSTATIN); Apply 1 application topically 2 (two) times daily.  Dispense: 30 g; Refill: 0       Mar Daring, PA-C  [facility]

## 2016-10-06 NOTE — Patient Instructions (Signed)
Nystatin skin cream or ointment What is this medicine? NYSTATIN (nye STAT in) is an antifungal medicine. It is used to treat certain kinds of fungal or yeast infections of the skin. This medicine may be used for other purposes; ask your health care provider or pharmacist if you have questions. COMMON BRAND NAME(S): Mycostatin, Nystex, Pediaderm AF What should I tell my health care provider before I take this medicine? They need to know if you have any of these conditions: -an unusual or allergic reaction to nystatin, other foods, dyes or preservatives -pregnant or trying to get pregnant -breast-feeding How should I use this medicine? This medicine is for external use on the skin only. Follow the directions on the prescription label. Wash hands before and after use. If treating a hand or nail infection, wash hands before use only. Apply a thin layer of this medicine to cover the affected skin and surrounding area. You can cover the area with a sterile gauze dressing (bandage). Do not use an airtight bandage (such as a plastic-covered bandage). Do not get the medicine in your eyes. If you do, rinse out with plenty of cool tap water. Use the full course of treatment prescribed, even if you think the infection is getting better. Use at regular intervals. Do not use your medicine more often than directed. Do not use this medicine for any condition other than the one for which it was prescribed. Talk to your pediatrician regarding the use of this medicine in children. Special care may be needed. Overdosage: If you think you have taken too much of this medicine contact a poison control center or emergency room at once. NOTE: This medicine is only for you. Do not share this medicine with others. What if I miss a dose? If you miss a dose, use it as soon as you can. If it is almost time for your next dose, use only that dose. Do not use double or extra doses. What may interact with this  medicine? Interactions are not expected. Do not use any other skin products on the affected area without telling your doctor or health care professional. This list may not describe all possible interactions. Give your health care provider a list of all the medicines, herbs, non-prescription drugs, or dietary supplements you use. Also tell them if you smoke, drink alcohol, or use illegal drugs. Some items may interact with your medicine. What should I watch for while using this medicine? Tell your doctor or health care professional if your symptoms do not improve after 3 days. After bathing make sure that your skin is very dry. Fungal infections like moist conditions. Do not walk around barefoot. To help prevent reinfection, wear freshly washed cotton, not synthetic, clothing. What side effects may I notice from receiving this medicine? Side effects that usually do not require medical attention (report to your doctor or health care professional if they continue or are bothersome): -skin irritation This list may not describe all possible side effects. Call your doctor for medical advice about side effects. You may report side effects to FDA at 1-800-FDA-1088. Where should I keep my medicine? Keep out of the reach of children. Store at room temperature 15 to 30 degrees C (59 to 86 degrees F). Throw away any unused medicine after the expiration date. NOTE: This sheet is a summary. It may not cover all possible information. If you have questions about this medicine, talk to your doctor, pharmacist, or health care provider.  2018 Elsevier/Gold Standard (2015-04-15 16:28:30)  

## 2016-10-19 ENCOUNTER — Telehealth: Payer: Self-pay | Admitting: Obstetrics and Gynecology

## 2016-10-19 NOTE — Telephone Encounter (Signed)
Patient LVM that she was seen by Dr Jeanice Lim a gastroenterologist @ [facility]. She has talked to Dr Marcelline Mates a few times about the various contents in her uterus - bats and tape worms. She states that Dr Theophilus Kinds gave her ocytocin (she thinks that is what it was) and the contents popped right out of her uterus. Now she wants to see about totally emptying the uterus, get the contents out - she states that she's having some nausea and vomiting this morning but she doesn't think its affecting her bowls too much. Could you and Dr Theophilus Kinds get together and work something out about this, she wants to get the problem resolved. She could go back to [place] but it is a drive for her. Dr Marcelline Mates could call Dr Theophilus Kinds @ [phone] to discuss.

## 2016-10-25 ENCOUNTER — Telehealth: Payer: Self-pay | Admitting: Obstetrics and Gynecology

## 2016-10-25 NOTE — Telephone Encounter (Signed)
Diamond Lucas that she needs to be admitted for treatment of the bat sleeping next to the human baby in uterine that is in destress - she heard that there is a shot to give to the female to put the bat to sleep and that then there can be dilation and the human baby can be rescued and then the bats removed.

## 2016-11-04 ENCOUNTER — Ambulatory Visit (INDEPENDENT_AMBULATORY_CARE_PROVIDER_SITE_OTHER): Payer: Medicare Other | Admitting: Obstetrics and Gynecology

## 2016-11-04 ENCOUNTER — Encounter: Payer: Self-pay | Admitting: Obstetrics and Gynecology

## 2016-11-04 VITALS — BP 135/82 | HR 73 | Ht 61.0 in | Wt 112.4 lb

## 2016-11-04 DIAGNOSIS — F22 Delusional disorders: Secondary | ICD-10-CM | POA: Diagnosis not present

## 2016-11-04 MED ORDER — MISOPROSTOL 200 MCG PO TABS: ORAL_TABLET | ORAL | 2 refills | Status: DC

## 2016-11-04 NOTE — Progress Notes (Signed)
    GYNECOLOGY PROGRESS NOTE  Subjective:    Patient ID: Diamond Lucas, female    DOB: 1961/01/21, 56 y.o.   MRN: [id number]  HPI  Patient is a 56 y.o. G0P0 female with a history of bipolar disorder and delusional disorder who presents for complaints of abdominal discomfort.  Patient notes that she would like to be set up to use the birthing chair to allow the bats to come out. Notes that there is also a tapeworm in her uterus, and that she saw "flashing lights go across her belly" several days ago which was a signal that the tapeworm and bats were ready to come out. She brought a sample of her nasal mucus which she collected this morning in case we desired to test it for tapeworms.   States that she is also concerned as several men broke into her apartment last week, and had an "ejaculation party", and is worried that she may have been inseminated.    The following portions of the patient's history were reviewed and updated as appropriate: allergies, current medications, past family history, past medical history, past social history, past surgical history and problem list.  Review of Systems Pertinent items noted in HPI and remainder of comprehensive ROS otherwise negative.   Objective:   Blood pressure 135/82, pulse 73, height 5\' 1"  (1.549 m), weight 112 lb 6.4 oz (51 kg), last menstrual period 04/10/2015. General appearance: alert and no distress Abdomen: soft, non-tender; bowel sounds normal; no masses,  no organomegaly Pelvic: deferred   Assessment:   Delusional disorder Bipolar disorder  Plan:   Patient with delusional disorder, has been seen at several different clinics multiple times due to delusions of pregnancy (including human and bat fetuses), as well as infestation with tape worms.  She has undergone workup, and is currently taking medications, but continues to have delusion.  Discussed with patient that the OB/GYN office usually doesn't manage tapeworms or other  wildlife.  Offered patient an in office Endosee procedure (in office hysteroscopy) to have real time imaging of her uterus to visualize her uterine cavity.  Patient states that perhaps this will "flush everything out".  Discussed procedure in detail including risks/benefits.  To f/u in 1-2 weeks for Endosee procedure. Will prescribe Cytotec secondary to menopausal stenotic cervix and will likely need dilation.   A total of 15 minutes were spent face-to-face with the patient during this encounter and over half of that time dealt with counseling and coordination of care.   [name], MD [facility]

## 2016-11-04 NOTE — Patient Instructions (Addendum)
Place Cytotec tablets under tongue 4 hours prior to procedure.

## 2016-11-07 ENCOUNTER — Encounter: Payer: Self-pay | Admitting: Physician Assistant

## 2016-11-07 ENCOUNTER — Ambulatory Visit (INDEPENDENT_AMBULATORY_CARE_PROVIDER_SITE_OTHER): Payer: Medicare Other | Admitting: Physician Assistant

## 2016-11-07 VITALS — BP 110/60 | HR 68 | Temp 97.6°F | Resp 16 | Wt 114.6 lb

## 2016-11-07 DIAGNOSIS — J301 Allergic rhinitis due to pollen: Secondary | ICD-10-CM

## 2016-11-07 DIAGNOSIS — F22 Delusional disorders: Secondary | ICD-10-CM | POA: Diagnosis not present

## 2016-11-07 NOTE — Progress Notes (Signed)
Patient: Diamond Lucas Female    DOB: [birth year]   56 y.o.   MRN: [id number] Visit Date: 11/07/2016  Today's Provider: Mar Daring, PA-C   Chief Complaint  Patient presents with  . Nasal Congestion   Subjective:    HPI  Patient is here today with c/o sticky drainage came out of her nose on Friday morning. She denies URI symptoms. Per patient she believes a tape worm and thinks is "possible a bat/rat tapeworm. She went that same Friday and saw GYN and took sample of mucus in case they desired to test it for tapeworms.       Allergies  Allergen Reactions  . Diclofenac Sodium Palpitations  . Naproxen Itching, Rash and Hives  . Olanzapine     Other reaction(s): Other Cross-eyed and hyperventilation  . Dust Mite Mixed Allergen Ext [Mite (D. Farinae)] Itching    Respiratory illness, and itchy eyes, eye problems  . Nsaids     Other reaction(s): Other (See Comments)  . Other Itching    Respiratory illness, and itchy eyes, eye problems Respiratory illness, and itchy eyes, eye problems     Current Outpatient Prescriptions:  .  Calcium Carbonate-Vit D-Min (CALCIUM 600+D PLUS MINERALS) 600-400 MG-UNIT TABS, Take by mouth., Disp: , Rfl:  .  cycloSPORINE (RESTASIS) 0.05 % ophthalmic emulsion, Administer 1 drop to both eyes every twelve (12) hours., Disp: , Rfl:  .  ibuprofen (ADVIL,MOTRIN) 600 MG tablet, Take 1 tablet (600 mg total) by mouth every 8 (eight) hours as needed., Disp: 30 tablet, Rfl: 0 .  levothyroxine (SYNTHROID, LEVOTHROID) 75 MCG tablet, TAKE 1 TABLET BY MOUTH DAILY, Disp: , Rfl:  .  misoprostol (CYTOTEC) 200 MCG tablet, Place two tablets under the tongue 4 hours prior to your next clinic appointment, Disp: 2 tablet, Rfl: 2 .  Multiple Vitamin (MULTI-VITAMINS) TABS, , Disp: , Rfl:  .  nystatin ointment (MYCOSTATIN), Apply 1 application topically 2 (two) times daily., Disp: 30 g, Rfl: 0 .  risperiDONE (RISPERDAL M-TABS) 2 MG disintegrating tablet, Take  1 tablet (2 mg total) by mouth at bedtime., Disp: 30 tablet, Rfl: 1 .  triamcinolone cream (KENALOG) 0.1 %, Apply 1 application topically 2 (two) times daily., Disp: 30 g, Rfl: 0  Review of Systems  Constitutional: Negative.   HENT: Positive for congestion, postnasal drip and sinus pressure. Negative for ear pain, sinus pain, sneezing, sore throat, tinnitus and trouble swallowing.   Respiratory: Negative.   Cardiovascular: Negative.   Gastrointestinal: Negative.   Neurological: Negative.     Social History  Substance Use Topics  . Smoking status: Never Smoker  . Smokeless tobacco: Never Used  . Alcohol use No   Objective:   BP 110/60 (BP Location: Left Arm, Patient Position: Sitting, Cuff Size: Normal)   Pulse 68   Temp 97.6 F (36.4 C) (Oral)   Resp 16   Wt 114 lb 9.6 oz (52 kg)   LMP 04/10/2015 Comment: Only spotting  BMI 21.65 kg/m  Vitals:   11/07/16 0808  BP: 110/60  Pulse: 68  Resp: 16  Temp: 97.6 F (36.4 C)  TempSrc: Oral  Weight: 114 lb 9.6 oz (52 kg)     Physical Exam  Constitutional: She appears well-developed and well-nourished. No distress.  HENT:  Head: Normocephalic and atraumatic.  Right Ear: Hearing, tympanic membrane, external ear and ear canal normal.  Left Ear: Hearing, tympanic membrane, external ear and ear canal normal.  Nose: Right sinus  exhibits no maxillary sinus tenderness and no frontal sinus tenderness. Left sinus exhibits no maxillary sinus tenderness and no frontal sinus tenderness.  Mouth/Throat: Uvula is midline and mucous membranes are normal. No oropharyngeal exudate, posterior oropharyngeal edema or posterior oropharyngeal erythema.  Mild ethmoid tenderness with pale turbinates, mild post nasal drainage  Neck: Normal range of motion. Neck supple. No tracheal deviation present. No thyromegaly present.  Cardiovascular: Normal rate, regular rhythm and normal heart sounds.  Exam reveals no gallop and no friction rub.   No murmur  heard. Pulmonary/Chest: Effort normal and breath sounds normal. No stridor. No respiratory distress. She has no wheezes. She has no rales.  Lymphadenopathy:    She has no cervical adenopathy.  Skin: She is not diaphoretic.  Vitals reviewed.      Assessment & Plan:     1. Delusion of infestation [facility]) Discussed with patient ways to contract tape worms and tried to reassure that if she has not been exposed it is most unlikely that she would have a tapeworm infestation. She voiced understanding. Discussed that symptoms seem most consistent with seasonal allergies vs viral URI that is improving. She has had allergy symptoms in the past that are similar. She has OTC allergy medication at home already that she agrees to restart. She is to call if symptoms worsen.  2. Seasonal allergic rhinitis due to pollen See above medical treatment plan.       Mar Daring, PA-C  [facility]

## 2016-11-07 NOTE — Patient Instructions (Signed)
Allergic Rhinitis Allergic rhinitis is when the mucous membranes in the nose respond to allergens. Allergens are particles in the air that cause your body to have an allergic reaction. This causes you to release allergic antibodies. Through a chain of events, these eventually cause you to release histamine into the blood stream. Although meant to protect the body, it is this release of histamine that causes your discomfort, such as frequent sneezing, congestion, and an itchy, runny nose. What are the causes? Seasonal allergic rhinitis (hay fever) is caused by pollen allergens that may come from grasses, trees, and weeds. Year-round allergic rhinitis (perennial allergic rhinitis) is caused by allergens such as house dust mites, pet dander, and mold spores. What are the signs or symptoms?  Nasal stuffiness (congestion).  Itchy, runny nose with sneezing and tearing of the eyes. How is this diagnosed? Your health care provider can help you determine the allergen or allergens that trigger your symptoms. If you and your health care provider are unable to determine the allergen, skin or blood testing may be used. Your health care provider will diagnose your condition after taking your health history and performing a physical exam. Your health care provider may assess you for other related conditions, such as asthma, pink eye, or an ear infection. How is this treated? Allergic rhinitis does not have a cure, but it can be controlled by:  Medicines that block allergy symptoms. These may include allergy shots, nasal sprays, and oral antihistamines.  Avoiding the allergen. Hay fever may often be treated with antihistamines in pill or nasal spray forms. Antihistamines block the effects of histamine. There are over-the-counter medicines that may help with nasal congestion and swelling around the eyes. Check with your health care provider before taking or giving this medicine. If avoiding the allergen or the  medicine prescribed do not work, there are many new medicines your health care provider can prescribe. Stronger medicine may be used if initial measures are ineffective. Desensitizing injections can be used if medicine and avoidance does not work. Desensitization is when a patient is given ongoing shots until the body becomes less sensitive to the allergen. Make sure you follow up with your health care provider if problems continue. Follow these instructions at home: It is not possible to completely avoid allergens, but you can reduce your symptoms by taking steps to limit your exposure to them. It helps to know exactly what you are allergic to so that you can avoid your specific triggers. Contact a health care provider if:  You have a fever.  You develop a cough that does not stop easily (persistent).  You have shortness of breath.  You start wheezing.  Symptoms interfere with normal daily activities. This information is not intended to replace advice given to you by your health care provider. Make sure you discuss any questions you have with your health care provider. Document Released: 12/07/2000 Document Revised: 11/13/2015 Document Reviewed: 11/19/2012 Elsevier Interactive Patient Education  2017 Elsevier Inc.  

## 2016-11-17 ENCOUNTER — Encounter: Payer: Self-pay | Admitting: Obstetrics and Gynecology

## 2016-11-23 ENCOUNTER — Encounter: Payer: Self-pay | Admitting: Obstetrics and Gynecology

## 2016-11-23 ENCOUNTER — Ambulatory Visit (INDEPENDENT_AMBULATORY_CARE_PROVIDER_SITE_OTHER): Payer: Medicare Other | Admitting: Obstetrics and Gynecology

## 2016-11-23 VITALS — BP 160/89 | HR 86

## 2016-11-23 DIAGNOSIS — Z780 Asymptomatic menopausal state: Secondary | ICD-10-CM

## 2016-11-23 DIAGNOSIS — F200 Paranoid schizophrenia: Secondary | ICD-10-CM | POA: Diagnosis not present

## 2016-11-23 DIAGNOSIS — F22 Delusional disorders: Secondary | ICD-10-CM

## 2016-11-23 DIAGNOSIS — F458 Other somatoform disorders: Secondary | ICD-10-CM

## 2016-11-23 NOTE — Progress Notes (Signed)
    GYNECOLOGY PROGRESS NOTE  Subjective:    Patient ID: Diamond Lucas, female    DOB: [birth year], 56 y.o.   MRN: [id number]  HPI  Patient is a 56 y.o. G0P0 female who presents for scheduled Endosee procedure.  Patient has a history of schizophrenia continues to report delusions of pregnancy and infestation.  States that she is here to have the bats and tapeworms removed from her uterus.  Notes that she brought her suitcase in case we need to have a container to place the bats in [place]  Patient notes that there are least 4 of them, and she has named them.  She notes that there may also be a human fetus inside as some men broke into her apartment and vandalized it, as well as ejaculating in her laundry basket.  Patient states she tried to was her clothes well however there may have been some residual sperm that could have impregnated her.  Patient notes that she has not been sexually active in many years.   The following portions of the patient's history were reviewed and updated as appropriate: allergies, current medications, past family history, past medical history, past social history, past surgical history and problem list.  Review of Systems Pertinent items noted in HPI and remainder of comprehensive ROS otherwise negative.   Objective:   Blood pressure (!) 160/89, pulse 86, last menstrual period 04/10/2015. General appearance: alert and no distress Abdomen: soft, non-tender; bowel sounds normal; no masses,  no organomegaly Pelvic: external genitalia normal, rectovaginal septum normal.  Vagina without discharge.  Cervix normal appearing, stenotic.  Uterus mobile, nontender, normal shape and size.  Adnexae non-palpable, nontender bilaterally.  Extremities: extremities normal, atraumatic, no cyanosis or edema Neurologic: Grossly normal   Assessment:   Postmenopausal female Delusions of pregnancy and infestation  Plan:   - "Evacuation"  attempted today.  Cervical dilation performed.  Endosee procedure attempted however could not pass canula through the cervical canal.  An endometrial pipelle was used to "flush" the uterus out with normal saline.   - Advised patient to f/u in several weeks as needed.    [name], MD [facility]

## 2016-11-24 DIAGNOSIS — S93601A Unspecified sprain of right foot, initial encounter: Secondary | ICD-10-CM | POA: Diagnosis not present

## 2016-11-24 DIAGNOSIS — M79671 Pain in right foot: Secondary | ICD-10-CM | POA: Diagnosis not present

## 2016-11-24 DIAGNOSIS — M7751 Other enthesopathy of right foot: Secondary | ICD-10-CM | POA: Diagnosis not present

## 2016-12-12 ENCOUNTER — Telehealth: Payer: Self-pay | Admitting: Obstetrics and Gynecology

## 2016-12-12 NOTE — Telephone Encounter (Signed)
Pt called and left voicemail on Friday 12/09/16. Pt stated that she was still having distention more so on the left side and she was having fluttering and pain in abdomen. Pt stated that she wasn't sure if she needed another [place] or have the "camera inserted in her vagina again". Pt stated on the voicemail that she has crap legs in her. Please advise. Thanks TNP

## 2016-12-13 ENCOUNTER — Encounter: Payer: Self-pay | Admitting: Obstetrics and Gynecology

## 2016-12-13 NOTE — Telephone Encounter (Signed)
Spoke with pt via mychart

## 2016-12-14 ENCOUNTER — Encounter: Payer: Self-pay | Admitting: Obstetrics and Gynecology

## 2016-12-20 DIAGNOSIS — F4323 Adjustment disorder with mixed anxiety and depressed mood: Secondary | ICD-10-CM | POA: Diagnosis not present

## 2016-12-23 ENCOUNTER — Encounter: Payer: Self-pay | Admitting: Obstetrics and Gynecology

## 2016-12-23 ENCOUNTER — Ambulatory Visit (INDEPENDENT_AMBULATORY_CARE_PROVIDER_SITE_OTHER): Payer: Medicare Other | Admitting: Obstetrics and Gynecology

## 2016-12-23 VITALS — BP 146/79 | Ht 61.0 in | Wt 116.6 lb

## 2016-12-23 DIAGNOSIS — F458 Other somatoform disorders: Secondary | ICD-10-CM | POA: Diagnosis not present

## 2016-12-23 DIAGNOSIS — F22 Delusional disorders: Secondary | ICD-10-CM | POA: Diagnosis not present

## 2016-12-23 MED ORDER — IVERMECTIN 3 MG PO TABS
200.0000 ug/kg | ORAL_TABLET | Freq: Once | ORAL | 1 refills | Status: AC
Start: 2016-12-23 — End: 2016-12-23

## 2016-12-23 NOTE — Progress Notes (Signed)
    GYNECOLOGY PROGRESS NOTE  Subjective:    Patient ID: Diamond Lucas, female    DOB: 07/26/1960, 56 y.o.   MRN: [id number]  HPI  Patient is a 56 y.o. G0P0000 female with a h/o delusional disorder who presents for complaints of crab lice in the uterus.  States that this has been ongoing for the past 2 weeks.  Notes that she used a peroxide douche last night to try to kill the lice, but is aware that the peroxide was unable to reach to the inside of the uterus.  Notes that she thinks she may need a a "flushing of her uterus", or an ultrasound to evaluate if the crab lice were there. Is concerned that the crab lice might be affecting any fetuses that are present.   The following portions of the patient's history were reviewed and updated as appropriate: allergies, current medications, past family history, past medical history, past social history, past surgical history and problem list.  Review of Systems Pertinent items noted in HPI and remainder of comprehensive ROS otherwise negative.   Objective:   Blood pressure (!) 146/79, height 5\' 4"  (1.626 m), weight 116 lb 9.6 oz (52.9 kg), last menstrual period 04/10/2015. General appearance: alert and no distress Abdomen: soft, non-tender; bowel sounds normal; no masses,  no organomegaly Pelvic: external genitalia normal, no lesions.  Internal exam not performed.    Assessment:   Delusion of infestation Delusion of pregnancy  Plan:   Discouraged patient from using douches, especially containing substances like peroxide.  Discussed that lice typically infect hair containing areas such as the head or pubic region.  Usually not noted inside the uterus.  Advised that an ultrasound is not a modality to identify lice.  Discussed that during last visit when she had an in-office hysteroscopy that no fetus or lice were present.  However patient still desires treatment of the lice. Discussed systemic oral treatment for "suspected lice infestation".   Will send prescription (see orders) with 1 refill.    [name], MD [facility]

## 2016-12-28 ENCOUNTER — Encounter: Payer: Self-pay | Admitting: Obstetrics and Gynecology

## 2016-12-29 DIAGNOSIS — L7211 Pilar cyst: Secondary | ICD-10-CM | POA: Diagnosis not present

## 2016-12-30 DIAGNOSIS — M9901 Segmental and somatic dysfunction of cervical region: Secondary | ICD-10-CM | POA: Diagnosis not present

## 2016-12-30 DIAGNOSIS — R51 Headache: Secondary | ICD-10-CM | POA: Diagnosis not present

## 2016-12-30 DIAGNOSIS — Z72820 Sleep deprivation: Secondary | ICD-10-CM | POA: Diagnosis not present

## 2016-12-30 DIAGNOSIS — M9905 Segmental and somatic dysfunction of pelvic region: Secondary | ICD-10-CM | POA: Diagnosis not present

## 2016-12-30 DIAGNOSIS — M6240 Contracture of muscle, unspecified site: Secondary | ICD-10-CM | POA: Diagnosis not present

## 2016-12-30 DIAGNOSIS — M9903 Segmental and somatic dysfunction of lumbar region: Secondary | ICD-10-CM | POA: Diagnosis not present

## 2016-12-30 DIAGNOSIS — M9902 Segmental and somatic dysfunction of thoracic region: Secondary | ICD-10-CM | POA: Diagnosis not present

## 2016-12-30 DIAGNOSIS — M791 Myalgia, unspecified site: Secondary | ICD-10-CM | POA: Diagnosis not present

## 2017-01-10 ENCOUNTER — Encounter: Payer: Self-pay | Admitting: Physician Assistant

## 2017-01-14 ENCOUNTER — Encounter: Payer: Self-pay | Admitting: Physician Assistant

## 2017-01-16 ENCOUNTER — Telehealth: Payer: Self-pay

## 2017-01-16 NOTE — Telephone Encounter (Signed)
Patient calling that she has been having dizzy spells off and on since December of last year, worsening since the summer of this year and only happens in the mornings. She reports it feels like the room is spinning. She reports she is better but feels like she may have some type of syndrome. Patient was scheduled by Helene Kelp for tomorrow at 11:30. Told patient since it has been going on for a while off and on to keep the appointment for tomorrow. If provider advises something different I will give a phone call.  Thanks,  -[name]

## 2017-01-16 NOTE — Telephone Encounter (Signed)
Noted and agree. 

## 2017-01-17 ENCOUNTER — Ambulatory Visit (INDEPENDENT_AMBULATORY_CARE_PROVIDER_SITE_OTHER): Payer: Medicare Other | Admitting: Physician Assistant

## 2017-01-17 ENCOUNTER — Encounter: Payer: Self-pay | Admitting: Physician Assistant

## 2017-01-17 VITALS — BP 130/90 | HR 71 | Temp 97.5°F | Resp 16 | Ht 60.0 in | Wt 118.2 lb

## 2017-01-17 DIAGNOSIS — H8110 Benign paroxysmal vertigo, unspecified ear: Secondary | ICD-10-CM

## 2017-01-17 DIAGNOSIS — Z23 Encounter for immunization: Secondary | ICD-10-CM | POA: Diagnosis not present

## 2017-01-17 NOTE — Patient Instructions (Signed)
Benign Positional Vertigo Vertigo is the feeling that you or your surroundings are moving when they are not. Benign positional vertigo is the most common form of vertigo. The cause of this condition is not serious (is benign). This condition is triggered by certain movements and positions (is positional). This condition can be dangerous if it occurs while you are doing something that could endanger you or others, such as driving. What are the causes? In many cases, the cause of this condition is not known. It may be caused by a disturbance in an area of the inner ear that helps your brain to sense movement and balance. This disturbance can be caused by a viral infection (labyrinthitis), head injury, or repetitive motion. What increases the risk? This condition is more likely to develop in:  Women.  People who are 50 years of age or older.  What are the signs or symptoms? Symptoms of this condition usually happen when you move your head or your eyes in different directions. Symptoms may start suddenly, and they usually last for less than a minute. Symptoms may include:  Loss of balance and falling.  Feeling like you are spinning or moving.  Feeling like your surroundings are spinning or moving.  Nausea and vomiting.  Blurred vision.  Dizziness.  Involuntary eye movement (nystagmus).  Symptoms can be mild and cause only slight annoyance, or they can be severe and interfere with daily life. Episodes of benign positional vertigo may return (recur) over time, and they may be triggered by certain movements. Symptoms may improve over time. How is this diagnosed? This condition is usually diagnosed by medical history and a physical exam of the head, neck, and ears. You may be referred to a health care provider who specializes in ear, nose, and throat (ENT) problems (otolaryngologist) or a provider who specializes in disorders of the nervous system (neurologist). You may have additional testing,  including:  MRI.  A CT scan.  Eye movement tests. Your health care provider may ask you to change positions quickly while he or she watches you for symptoms of benign positional vertigo, such as nystagmus. Eye movement may be tested with an electronystagmogram (ENG), caloric stimulation, the Dix-Hallpike test, or the roll test.  An electroencephalogram (EEG). This records electrical activity in your brain.  Hearing tests.  How is this treated? Usually, your health care provider will treat this by moving your head in specific positions to adjust your inner ear back to normal. Surgery may be needed in severe cases, but this is rare. In some cases, benign positional vertigo may resolve on its own in 2-4 weeks. Follow these instructions at home: Safety  Move slowly.Avoid sudden body or head movements.  Avoid driving.  Avoid operating heavy machinery.  Avoid doing any tasks that would be dangerous to you or others if a vertigo episode would occur.  If you have trouble walking or keeping your balance, try using a cane for stability. If you feel dizzy or unstable, sit down right away.  Return to your normal activities as told by your health care provider. Ask your health care provider what activities are safe for you. General instructions  Take over-the-counter and prescription medicines only as told by your health care provider.  Avoid certain positions or movements as told by your health care provider.  Drink enough fluid to keep your urine clear or pale yellow.  Keep all follow-up visits as told by your health care provider. This is important. Contact a health care   provider if:  You have a fever.  Your condition gets worse or you develop new symptoms.  Your family or friends notice any behavioral changes.  Your nausea or vomiting gets worse.  You have numbness or a "pins and needles" sensation. Get help right away if:  You have difficulty speaking or moving.  You are  always dizzy.  You faint.  You develop severe headaches.  You have weakness in your legs or arms.  You have changes in your hearing or vision.  You develop a stiff neck.  You develop sensitivity to light. This information is not intended to replace advice given to you by your health care provider. Make sure you discuss any questions you have with your health care provider. Document Released: 12/20/2005 Document Revised: 08/20/2015 Document Reviewed: 07/07/2014 Elsevier Interactive Patient Education  2018 Elsevier Inc.   Meclizine tablets or capsules What is this medicine? MECLIZINE (MEK li zeen) is an antihistamine. It is used to prevent nausea, vomiting, or dizziness caused by motion sickness. It is also used to prevent and treat vertigo (extreme dizziness or a feeling that you or your surroundings are tilting or spinning around). This medicine may be used for other purposes; ask your health care provider or pharmacist if you have questions. COMMON BRAND NAME(S): Antivert, Dramamine Less Drowsy, Medivert, Meni-D What should I tell my health care provider before I take this medicine? They need to know if you have any of these conditions: -glaucoma -lung or breathing disease, like asthma -problems urinating -prostate disease -stomach or intestine problems -an unusual or allergic reaction to meclizine, other medicines, foods, dyes, or preservatives -pregnant or trying to get pregnant -breast-feeding How should I use this medicine? Take this medicine by mouth with a glass of water. Follow the directions on the prescription label. If you are using this medicine to prevent motion sickness, take the dose at least 1 hour before travel. If it upsets your stomach, take it with food or milk. Take your doses at regular intervals. Do not take your medicine more often than directed. Talk to your pediatrician regarding the use of this medicine in children. Special care may be  needed. Overdosage: If you think you have taken too much of this medicine contact a poison control center or emergency room at once. NOTE: This medicine is only for you. Do not share this medicine with others. What if I miss a dose? If you miss a dose, take it as soon as you can. If it is almost time for your next dose, take only that dose. Do not take double or extra doses. What may interact with this medicine? Do not take this medicine with any of the following medications: -MAOIs like Carbex, Eldepryl, Marplan, Nardil, and Parnate This medicine may also interact with the following medications: -alcohol -antihistamines for allergy, cough and cold -certain medicines for anxiety or sleep -certain medicines for depression, like amitriptyline, fluoxetine, sertraline -certain medicines for seizures like phenobarbital, primidone -general anesthetics like halothane, isoflurane, methoxyflurane, propofol -local anesthetics like lidocaine, pramoxine, tetracaine -medicines that relax muscles for surgery -narcotic medicines for pain -phenothiazines like chlorpromazine, mesoridazine, prochlorperazine, thioridazine This list may not describe all possible interactions. Give your health care provider a list of all the medicines, herbs, non-prescription drugs, or dietary supplements you use. Also tell them if you smoke, drink alcohol, or use illegal drugs. Some items may interact with your medicine. What should I watch for while using this medicine? Tell your doctor or healthcare professional if your   symptoms do not start to get better or if they get worse. You may get drowsy or dizzy. Do not drive, use machinery, or do anything that needs mental alertness until you know how this medicine affects you. Do not stand or sit up quickly, especially if you are an older patient. This reduces the risk of dizzy or fainting spells. Alcohol may interfere with the effect of this medicine. Avoid alcoholic drinks. Your  mouth may get dry. Chewing sugarless gum or sucking hard candy, and drinking plenty of water may help. Contact your doctor if the problem does not go away or is severe. This medicine may cause dry eyes and blurred vision. If you wear contact lenses you may feel some discomfort. Lubricating drops may help. See your eye doctor if the problem does not go away or is severe. What side effects may I notice from receiving this medicine? Side effects that you should report to your doctor or health care professional as soon as possible: -feeling faint or lightheaded, falls -fast, irregular heartbeat Side effects that usually do not require medical attention (report to your doctor or health care professional if they continue or are bothersome): -constipation -headache -trouble passing urine or change in the amount of urine -trouble sleeping -upset stomach This list may not describe all possible side effects. Call your doctor for medical advice about side effects. You may report side effects to FDA at 1-800-FDA-1088. Where should I keep my medicine? Keep out of the reach of children. Store at room temperature between 15 and 30 degrees C (59 and 86 degrees F). Keep container tightly closed. Throw away any unused medicine after the expiration date. NOTE: This sheet is a summary. It may not cover all possible information. If you have questions about this medicine, talk to your doctor, pharmacist, or health care provider.  2018 Elsevier/Gold Standard (2015-04-15 19:41:02)  

## 2017-01-17 NOTE — Progress Notes (Signed)
Patient: Diamond Lucas Female    DOB: 1960-09-07   56 y.o.   MRN: [id number] Visit Date: 01/17/2017  Today's Provider: Mar Daring, PA-C   Chief Complaint  Patient presents with  . Dizziness   Subjective:    Dizziness  This is a new (Patient reports that she has had this before.) problem. The current episode started more than 1 month ago (she reports it started in the summer while she was in the swimming pool when she did a "back float"). The problem occurs every several days (worse at night when she goes to bed. In the mornings when she turns to look at the clock.). The problem has been waxing and waning. Associated symptoms include abdominal pain (discomfort), fatigue ("reports not sleeping well"), nausea, vertigo (feels like her "feet are spinning") and a visual change. Pertinent negatives include no chest pain, headaches or vomiting. Exacerbated by: with head movement. She has tried position changes (neck exercises) for the symptoms. The treatment provided no relief.    Orthostatic VS for the past 24 hrs:  BP- Lying Pulse- Lying BP- Sitting Pulse- Sitting BP- Standing at 0 minutes Pulse- Standing at 0 minutes  01/17/17 1141 130/80 76 130/90 87 120/88 79      Allergies  Allergen Reactions  . Diclofenac Sodium Palpitations  . Naproxen Itching, Rash and Hives  . Olanzapine     Other reaction(s): Other Cross-eyed and hyperventilation  . Dust Mite Mixed Allergen Ext [Mite (D. Farinae)] Itching    Respiratory illness, and itchy eyes, eye problems  . Nsaids     Other reaction(s): Other (See Comments)  . Other Itching    Respiratory illness, and itchy eyes, eye problems Respiratory illness, and itchy eyes, eye problems     Current Outpatient Prescriptions:  .  Calcium Carbonate-Vit D-Min (CALCIUM 600+D PLUS MINERALS) 600-400 MG-UNIT TABS, Take by mouth., Disp: , Rfl:  .  cycloSPORINE (RESTASIS) 0.05 % ophthalmic emulsion, Administer 1 drop to both eyes every  twelve (12) hours., Disp: , Rfl:  .  ibuprofen (ADVIL,MOTRIN) 600 MG tablet, Take 1 tablet (600 mg total) by mouth every 8 (eight) hours as needed., Disp: 30 tablet, Rfl: 0 .  levothyroxine (SYNTHROID, LEVOTHROID) 75 MCG tablet, TAKE 1 TABLET BY MOUTH DAILY, Disp: , Rfl:  .  Multiple Vitamin (MULTI-VITAMINS) TABS, , Disp: , Rfl:  .  nystatin ointment (MYCOSTATIN), Apply 1 application topically 2 (two) times daily., Disp: 30 g, Rfl: 0 .  risperiDONE (RISPERDAL) 3 MG tablet, , Disp: , Rfl:  .  triamcinolone (NASACORT ALLERGY 24HR) 55 MCG/ACT AERO nasal inhaler, Place 2 sprays into the nose daily., Disp: , Rfl:  .  triamcinolone cream (KENALOG) 0.1 %, Apply 1 application topically 2 (two) times daily., Disp: 30 g, Rfl: 0  Review of Systems  Constitutional: Positive for fatigue ("reports not sleeping well").  Respiratory: Negative.   Cardiovascular: Negative for chest pain, palpitations and leg swelling.  Gastrointestinal: Positive for abdominal pain (discomfort) and nausea. Negative for vomiting.  Neurological: Positive for dizziness and vertigo (feels like her "feet are spinning"). Negative for headaches.  Psychiatric/Behavioral: Positive for sleep disturbance.    Social History  Substance Use Topics  . Smoking status: Never Smoker  . Smokeless tobacco: Never Used  . Alcohol use No   Objective:   BP 130/90 (BP Location: Right Arm, Patient Position: Sitting, Cuff Size: Normal)   Pulse 71   Temp (!) 97.5 F (36.4 C) (Oral)  Resp 16   Ht 5' (1.524 m)   Wt 118 lb 3.2 oz (53.6 kg)   LMP 04/10/2015 Comment: Only spotting  BMI 23.08 kg/m  Vitals:   01/17/17 1118  BP: 130/90  Pulse: 71  Resp: 16  Temp: (!) 97.5 F (36.4 C)  TempSrc: Oral  Weight: 118 lb 3.2 oz (53.6 kg)  Height: 5' (1.524 m)     Physical Exam  Constitutional: She is oriented to person, place, and time. She appears well-developed and well-nourished. No distress.  Neck: Normal range of motion. Neck supple. No  JVD present. No tracheal deviation present. No thyromegaly present.  Cardiovascular: Normal rate, regular rhythm and normal heart sounds.  Exam reveals no gallop and no friction rub.   No murmur heard. Pulmonary/Chest: Effort normal and breath sounds normal. No respiratory distress. She has no wheezes. She has no rales.  Musculoskeletal: She exhibits no edema.  Lymphadenopathy:    She has no cervical adenopathy.  Neurological: She is alert and oriented to person, place, and time. She has normal strength and normal reflexes. No cranial nerve deficit or sensory deficit. She displays a negative Romberg sign. Coordination and gait normal.  Skin: She is not diaphoretic.  Vitals reviewed.       Assessment & Plan:     1. Benign paroxysmal positional vertigo, unspecified laterality Suspect BPPV. Discussed causes and treatment with patient in the office today. Discussed meclizine but she wants to research and will call back if she desires to start. Also was instructed on how to perform the Brandt-Daroff exercises at home. She is to call if symptoms do not improve.   2. Need for influenza vaccination Flu vaccine given today without complication. Patient sat upright for 15 minutes to check for adverse reaction before being released. - Flu Vaccine QUAD 36+ mos IM       Mar Daring, PA-C  [facility]

## 2017-01-18 MED ORDER — MECLIZINE HCL 12.5 MG PO TABS: 12.5000 mg | ORAL_TABLET | Freq: Three times a day (TID) | ORAL | 3 refills | Status: DC | PRN

## 2017-01-18 NOTE — Telephone Encounter (Signed)
Can we update flu shot?

## 2017-01-25 ENCOUNTER — Encounter: Payer: Self-pay | Admitting: Obstetrics and Gynecology

## 2017-01-30 DIAGNOSIS — S90121A Contusion of right lesser toe(s) without damage to nail, initial encounter: Secondary | ICD-10-CM | POA: Diagnosis not present

## 2017-01-30 DIAGNOSIS — S9031XA Contusion of right foot, initial encounter: Secondary | ICD-10-CM | POA: Diagnosis not present

## 2017-01-30 DIAGNOSIS — M79671 Pain in right foot: Secondary | ICD-10-CM | POA: Diagnosis not present

## 2017-02-01 ENCOUNTER — Encounter: Payer: Self-pay | Admitting: Physician Assistant

## 2017-02-01 DIAGNOSIS — E890 Postprocedural hypothyroidism: Secondary | ICD-10-CM

## 2017-02-03 DIAGNOSIS — F312 Bipolar disorder, current episode manic severe with psychotic features: Secondary | ICD-10-CM | POA: Diagnosis not present

## 2017-02-03 DIAGNOSIS — F22 Delusional disorders: Secondary | ICD-10-CM | POA: Diagnosis not present

## 2017-02-03 DIAGNOSIS — R300 Dysuria: Secondary | ICD-10-CM | POA: Diagnosis not present

## 2017-02-06 ENCOUNTER — Encounter: Payer: Self-pay | Admitting: Physician Assistant

## 2017-02-06 DIAGNOSIS — Z9229 Personal history of other drug therapy: Secondary | ICD-10-CM

## 2017-02-07 ENCOUNTER — Encounter: Payer: Self-pay | Admitting: Obstetrics and Gynecology

## 2017-02-08 NOTE — Addendum Note (Signed)
Addended by: Mar Daring on: 02/08/2017 08:23 AM   Modules accepted: Orders

## 2017-02-10 DIAGNOSIS — Z9229 Personal history of other drug therapy: Secondary | ICD-10-CM | POA: Diagnosis not present

## 2017-02-10 DIAGNOSIS — E890 Postprocedural hypothyroidism: Secondary | ICD-10-CM | POA: Diagnosis not present

## 2017-02-11 ENCOUNTER — Encounter: Payer: Self-pay | Admitting: Physician Assistant

## 2017-02-14 ENCOUNTER — Telehealth: Payer: Self-pay

## 2017-02-14 LAB — T4: T4 TOTAL: 12 ug/dL — AB (ref 5.1–11.9)

## 2017-02-14 LAB — TSH: TSH: 0.38 mIU/L — ABNORMAL LOW

## 2017-02-14 LAB — TEST AUTHORIZATION

## 2017-02-14 LAB — T3: T3, Total: 94 ng/dL (ref 76–181)

## 2017-02-14 NOTE — Telephone Encounter (Signed)
-----   Message from Mar Daring, [place] sent at 02/14/2017  8:34 AM EST ----- TSH is borderline low at 0.38. May benefit by decreasing levothyroxine dose to 44mcg daily.

## 2017-02-14 NOTE — Telephone Encounter (Signed)
Can we add T4 to Diamond Lucas's TSH draw?  Thanks.  JB

## 2017-02-14 NOTE — Telephone Encounter (Signed)
Patient sent a message through my chart that she read the message regarding her thyroid. Please see messages.  Thanks,  -Rickayla Wieland

## 2017-02-15 ENCOUNTER — Telehealth: Payer: Self-pay

## 2017-02-15 NOTE — Telephone Encounter (Signed)
Patient advised as directed below. Per patient she is feeling a little better and that she was at the grocery store.  Thanks,  -[name]

## 2017-02-15 NOTE — Telephone Encounter (Signed)
-----   Message from Mar Daring, PA-C sent at 02/15/2017  8:36 AM EST ----- T3 and T4 stable.

## 2017-02-23 ENCOUNTER — Encounter: Payer: Self-pay | Admitting: Physician Assistant

## 2017-02-23 DIAGNOSIS — K649 Unspecified hemorrhoids: Secondary | ICD-10-CM

## 2017-02-24 MED ORDER — HYDROCORTISONE 2.5 % RE CREA: 1.0000 "application " | TOPICAL_CREAM | Freq: Two times a day (BID) | RECTAL | 0 refills | Status: DC

## 2017-02-24 NOTE — Addendum Note (Signed)
Addended by: Mar Daring on: 02/24/2017 01:03 PM   Modules accepted: Orders

## 2017-02-27 ENCOUNTER — Encounter: Payer: Self-pay | Admitting: Obstetrics and Gynecology

## 2017-03-01 ENCOUNTER — Telehealth: Payer: Self-pay | Admitting: Physician Assistant

## 2017-03-01 NOTE — Telephone Encounter (Signed)
Per Langley Gauss with [facility] the test takes 30 days to be resulted.Patient advised.  Thanks,  -Kale Dols

## 2017-03-01 NOTE — Telephone Encounter (Signed)
Denise with [facility] is going to check on that lab since it showed collected and received but no results yet.  Thanks,  -Drenda Sobecki

## 2017-03-01 NOTE — Telephone Encounter (Signed)
Pt is requesting lab results.  DJ#[phone]/[id number]

## 2017-03-03 ENCOUNTER — Encounter: Payer: Self-pay | Admitting: Obstetrics and Gynecology

## 2017-03-08 LAB — RABIES VIRUS ANTIBODY TITER: RABIES RESPONSE: 0.1 [IU]/mL

## 2017-03-12 ENCOUNTER — Encounter: Payer: Self-pay | Admitting: Physician Assistant

## 2017-03-12 DIAGNOSIS — Z207 Contact with and (suspected) exposure to pediculosis, acariasis and other infestations: Secondary | ICD-10-CM

## 2017-03-12 DIAGNOSIS — Z2089 Contact with and (suspected) exposure to other communicable diseases: Secondary | ICD-10-CM

## 2017-03-14 ENCOUNTER — Encounter: Payer: Self-pay | Admitting: Physician Assistant

## 2017-03-14 MED ORDER — PERMETHRIN 5 % EX CREA: TOPICAL_CREAM | CUTANEOUS | 0 refills | Status: DC

## 2017-03-21 ENCOUNTER — Encounter: Payer: Self-pay | Admitting: Obstetrics and Gynecology

## 2017-03-22 ENCOUNTER — Other Ambulatory Visit (HOSPITAL_COMMUNITY): Payer: Self-pay | Admitting: Psychiatry

## 2017-03-31 ENCOUNTER — Encounter: Payer: Self-pay | Admitting: Physician Assistant

## 2017-03-31 DIAGNOSIS — Z209 Contact with and (suspected) exposure to unspecified communicable disease: Secondary | ICD-10-CM

## 2017-04-03 MED ORDER — RABIES VIRUS VACCINE, HDC IM INJ: 1.0000 mL | INJECTION | Freq: Once | INTRAMUSCULAR | 0 refills | Status: AC

## 2017-04-04 ENCOUNTER — Encounter: Payer: Self-pay | Admitting: Physician Assistant

## 2017-04-05 ENCOUNTER — Encounter: Payer: Self-pay | Admitting: Obstetrics and Gynecology

## 2017-04-06 ENCOUNTER — Encounter: Payer: Self-pay | Admitting: Physician Assistant

## 2017-04-06 NOTE — Telephone Encounter (Signed)
Labs faxed to the number that was provided.  Thanks,  -Ariany Kesselman

## 2017-04-06 NOTE — Telephone Encounter (Signed)
Josie,  Can you please print and fax thyroid labs to Dr. Rod Can at [facility] [phone].  Grace Bushy, PA-C

## 2017-04-07 ENCOUNTER — Encounter: Payer: Self-pay | Admitting: Physician Assistant

## 2017-04-13 ENCOUNTER — Encounter: Payer: Self-pay | Admitting: Physician Assistant

## 2017-04-13 ENCOUNTER — Ambulatory Visit (INDEPENDENT_AMBULATORY_CARE_PROVIDER_SITE_OTHER): Payer: Medicare Other | Admitting: Physician Assistant

## 2017-04-13 VITALS — BP 110/70 | HR 79 | Temp 97.6°F | Resp 16 | Wt 117.8 lb

## 2017-04-13 DIAGNOSIS — F22 Delusional disorders: Secondary | ICD-10-CM | POA: Diagnosis not present

## 2017-04-13 DIAGNOSIS — R413 Other amnesia: Secondary | ICD-10-CM | POA: Diagnosis not present
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
## 2017-04-13 NOTE — Telephone Encounter (Signed)
Any thoughts?  Dr. Marcelline Mates

## 2017-04-14 NOTE — Telephone Encounter (Signed)
Ok.  I will mention that to her.  This is one of several messages that I receive from her each week.

## 2017-04-17 ENCOUNTER — Other Ambulatory Visit (HOSPITAL_COMMUNITY): Payer: Self-pay | Admitting: Psychiatry

## 2017-04-19 ENCOUNTER — Encounter: Payer: Self-pay | Admitting: Physician Assistant

## 2017-05-07 ENCOUNTER — Encounter: Payer: Self-pay | Admitting: Obstetrics and Gynecology

## 2017-05-18 ENCOUNTER — Other Ambulatory Visit: Payer: Self-pay | Admitting: Physician Assistant

## 2017-05-18 DIAGNOSIS — L309 Dermatitis, unspecified: Secondary | ICD-10-CM

## 2017-05-18 MED ORDER — TRIAMCINOLONE ACETONIDE 0.1 % EX CREA: 1.0000 "application " | TOPICAL_CREAM | Freq: Two times a day (BID) | CUTANEOUS | 0 refills | Status: DC

## 2017-05-19 ENCOUNTER — Encounter: Payer: Self-pay | Admitting: Obstetrics and Gynecology

## 2017-05-28 ENCOUNTER — Encounter: Payer: Self-pay | Admitting: Obstetrics and Gynecology

## 2017-06-01 ENCOUNTER — Encounter: Payer: Self-pay | Admitting: Obstetrics and Gynecology

## 2017-06-02 ENCOUNTER — Encounter: Payer: Self-pay | Admitting: Physician Assistant

## 2017-06-05 DIAGNOSIS — H2513 Age-related nuclear cataract, bilateral: Secondary | ICD-10-CM | POA: Diagnosis not present

## 2017-06-06 DIAGNOSIS — F4323 Adjustment disorder with mixed anxiety and depressed mood: Secondary | ICD-10-CM | POA: Diagnosis not present

## 2017-06-08 ENCOUNTER — Encounter: Payer: Self-pay | Admitting: Physician Assistant

## 2017-06-14 ENCOUNTER — Ambulatory Visit (INDEPENDENT_AMBULATORY_CARE_PROVIDER_SITE_OTHER): Payer: Medicare Other | Admitting: Physician Assistant

## 2017-06-14 ENCOUNTER — Encounter: Payer: Self-pay | Admitting: Physician Assistant

## 2017-06-14 VITALS — BP 120/80 | HR 66 | Temp 97.7°F | Resp 16 | Ht 60.0 in | Wt 121.0 lb

## 2017-06-14 DIAGNOSIS — R079 Chest pain, unspecified: Secondary | ICD-10-CM

## 2017-06-14 NOTE — Progress Notes (Signed)
Patient: Diamond Lucas Female    DOB: 09/24/1960   57 y.o.   MRN: [id number] Visit Date: 06/14/2017  Today's Provider: Mar Daring, PA-C   Chief Complaint  Patient presents with  . Chest Pain   Subjective:    HPI Patient here today requesting an EKG, due chest pain only when leaning forward. Patient concerned that pain is due to "weak chest wall." Patient reports she has had two episiodes in the last four months. Patient denies any shortness of breath, head ache, cough or any other symptom with chest pain. Patient denies any injuries that would cause chest pain. Patient reports she did not have to take any medications for chest pain. She does mention she had a cough just prior to the first occurrence but denies any recent coughing.      Allergies  Allergen Reactions  . Diclofenac Sodium Palpitations  . Naproxen Itching, Rash and Hives  . Olanzapine     Other reaction(s): Other Cross-eyed and hyperventilation  . Dust Mite Mixed Allergen Ext [Mite (D. Farinae)] Itching    Respiratory illness, and itchy eyes, eye problems  . Gluten Meal Other (See Comments)    Abdominal bloating  . Nsaids     Other reaction(s): Other (See Comments)  . Other Itching    Respiratory illness, and itchy eyes, eye problems Respiratory illness, and itchy eyes, eye problems     Current Outpatient Medications:  .  Calcium Carbonate-Vit D-Min (CALCIUM 600+D PLUS MINERALS) 600-400 MG-UNIT TABS, Take by mouth., Disp: , Rfl:  .  ibuprofen (ADVIL,MOTRIN) 600 MG tablet, Take 1 tablet (600 mg total) by mouth every 8 (eight) hours as needed., Disp: 30 tablet, Rfl: 0 .  levothyroxine (SYNTHROID, LEVOTHROID) 75 MCG tablet, TAKE 1 TABLET BY MOUTH DAILY, Disp: , Rfl:  .  Multiple Vitamin (MULTI-VITAMINS) TABS, , Disp: , Rfl:  .  risperiDONE (RISPERDAL) 2 MG tablet, Take 1 tablet (2 mg total) by mouth at bedtime., Disp: 30 tablet, Rfl: 0 .  triamcinolone (NASACORT ALLERGY 24HR) 55 MCG/ACT AERO  nasal inhaler, Place 2 sprays into the nose daily., Disp: , Rfl:  .  triamcinolone cream (KENALOG) 0.1 %, Apply 1 application topically 2 (two) times daily., Disp: 80 g, Rfl: 0 .  meclizine (ANTIVERT) 12.5 MG tablet, Take 1 tablet (12.5 mg total) by mouth 3 (three) times daily as needed for dizziness. (Patient not taking: Reported on 04/13/2017), Disp: 30 tablet, Rfl: 3  Review of Systems  Constitutional: Negative.   Respiratory: Negative.   Cardiovascular: Positive for chest pain.  Gastrointestinal: Negative.   Genitourinary: Negative.   Neurological: Negative.     Social History   Tobacco Use  . Smoking status: Never Smoker  . Smokeless tobacco: Never Used  Substance Use Topics  . Alcohol use: No   Objective:   BP 120/80 (BP Location: Left Arm, Patient Position: Sitting, Cuff Size: Normal)   Pulse 66   Temp 97.7 F (36.5 C) (Oral)   Resp 16   Ht 5' (1.524 m)   Wt 121 lb (54.9 kg)   LMP 04/10/2015 Comment: Only spotting  SpO2 98%   BMI 23.63 kg/m  Vitals:   06/14/17 1127  BP: 120/80  Pulse: 66  Resp: 16  Temp: 97.7 F (36.5 C)  TempSrc: Oral  SpO2: 98%  Weight: 121 lb (54.9 kg)  Height: 5' (1.524 m)     Physical Exam  Constitutional: She appears well-developed and well-nourished. No distress.  Neck:  Normal range of motion. Neck supple. No tracheal deviation present. No thyromegaly present.  Cardiovascular: Normal rate, regular rhythm and normal heart sounds. Exam reveals no gallop and no friction rub.  No murmur heard. Pulmonary/Chest: Effort normal and breath sounds normal. No respiratory distress. She has no wheezes. She has no rales. She exhibits no tenderness.  Musculoskeletal: She exhibits no edema.  Lymphadenopathy:    She has no cervical adenopathy.  Skin: She is not diaphoretic.  Vitals reviewed.      Assessment & Plan:     1. Chest pain, unspecified type EKG today showed NSR rate of 66. Negative T waves in precordial leads, probably normal for  age. Patient has only had 2 episodes of the chest pain and only with leaning forward. Denies any SOB at baseline, chest pain with activity or rest, DOE, orthopnea, lower extremity edema. Low suspicion for angina. Suspect chest wall muscle strain or possibly a mild costochondritis secondary to cough that has since resolved. May use tylenol or IBU if pain recurs. Advised patient if it returns with any of the above symptoms she is to call the office.  - EKG 12-Lead       Mar Daring, PA-C  [facility]

## 2017-06-24 ENCOUNTER — Encounter: Payer: Self-pay | Admitting: Physician Assistant

## 2017-06-24 DIAGNOSIS — B852 Pediculosis, unspecified: Secondary | ICD-10-CM

## 2017-06-26 MED ORDER — PERMETHRIN 1 % EX LOTN: TOPICAL_LOTION | CUTANEOUS | 0 refills | Status: DC

## 2017-06-27 ENCOUNTER — Encounter: Payer: Self-pay | Admitting: Obstetrics and Gynecology

## 2017-07-07 ENCOUNTER — Encounter: Payer: Self-pay | Admitting: Physician Assistant

## 2017-07-07 DIAGNOSIS — B852 Pediculosis, unspecified: Secondary | ICD-10-CM

## 2017-07-07 MED ORDER — PERMETHRIN 5 % EX CREA: 1.0000 "application " | TOPICAL_CREAM | Freq: Once | CUTANEOUS | 0 refills | Status: AC

## 2017-07-12 ENCOUNTER — Ambulatory Visit (INDEPENDENT_AMBULATORY_CARE_PROVIDER_SITE_OTHER): Payer: Medicare Other

## 2017-07-12 ENCOUNTER — Encounter: Payer: Self-pay | Admitting: Physician Assistant

## 2017-07-12 ENCOUNTER — Ambulatory Visit (INDEPENDENT_AMBULATORY_CARE_PROVIDER_SITE_OTHER): Payer: Medicare Other | Admitting: Physician Assistant

## 2017-07-12 VITALS — BP 112/84 | HR 72 | Temp 97.8°F | Ht 60.0 in | Wt 120.2 lb

## 2017-07-12 DIAGNOSIS — Z1159 Encounter for screening for other viral diseases: Secondary | ICD-10-CM

## 2017-07-12 DIAGNOSIS — Z1239 Encounter for other screening for malignant neoplasm of breast: Secondary | ICD-10-CM

## 2017-07-12 DIAGNOSIS — Z0000 Encounter for general adult medical examination without abnormal findings: Secondary | ICD-10-CM

## 2017-07-12 DIAGNOSIS — Z1231 Encounter for screening mammogram for malignant neoplasm of breast: Secondary | ICD-10-CM

## 2017-07-12 DIAGNOSIS — I10 Essential (primary) hypertension: Secondary | ICD-10-CM

## 2017-07-12 DIAGNOSIS — E890 Postprocedural hypothyroidism: Secondary | ICD-10-CM

## 2017-07-12 DIAGNOSIS — Z114 Encounter for screening for human immunodeficiency virus [HIV]: Secondary | ICD-10-CM | POA: Diagnosis not present

## 2017-07-12 NOTE — Patient Instructions (Signed)
Health Maintenance for Postmenopausal Women Menopause is a normal process in which your reproductive ability comes to an end. This process happens gradually over a span of months to years, usually between the ages of 22 and 9. Menopause is complete when you have missed 12 consecutive menstrual periods. It is important to talk with your health care provider about some of the most common conditions that affect postmenopausal women, such as heart disease, cancer, and bone loss (osteoporosis). Adopting a healthy lifestyle and getting preventive care can help to promote your health and wellness. Those actions can also lower your chances of developing some of these common conditions. What should I know about menopause? During menopause, you may experience a number of symptoms, such as:  Moderate-to-severe hot flashes.  Night sweats.  Decrease in sex drive.  Mood swings.  Headaches.  Tiredness.  Irritability.  Memory problems.  Insomnia.  Choosing to treat or not to treat menopausal changes is an individual decision that you make with your health care provider. What should I know about hormone replacement therapy and supplements? Hormone therapy products are effective for treating symptoms that are associated with menopause, such as hot flashes and night sweats. Hormone replacement carries certain risks, especially as you become older. If you are thinking about using estrogen or estrogen with progestin treatments, discuss the benefits and risks with your health care provider. What should I know about heart disease and stroke? Heart disease, heart attack, and stroke become more likely as you age. This may be due, in part, to the hormonal changes that your body experiences during menopause. These can affect how your body processes dietary fats, triglycerides, and cholesterol. Heart attack and stroke are both medical emergencies. There are many things that you can do to help prevent heart disease  and stroke:  Have your blood pressure checked at least every 1-2 years. High blood pressure causes heart disease and increases the risk of stroke.  If you are 53-22 years old, ask your health care provider if you should take aspirin to prevent a heart attack or a stroke.  Do not use any tobacco products, including cigarettes, chewing tobacco, or electronic cigarettes. If you need help quitting, ask your health care provider.  It is important to eat a healthy diet and maintain a healthy weight. ? Be sure to include plenty of vegetables, fruits, low-fat dairy products, and lean protein. ? Avoid eating foods that are high in solid fats, added sugars, or salt (sodium).  Get regular exercise. This is one of the most important things that you can do for your health. ? Try to exercise for at least 150 minutes each week. The type of exercise that you do should increase your heart rate and make you sweat. This is known as moderate-intensity exercise. ? Try to do strengthening exercises at least twice each week. Do these in addition to the moderate-intensity exercise.  Know your numbers.Ask your health care provider to check your cholesterol and your blood glucose. Continue to have your blood tested as directed by your health care provider.  What should I know about cancer screening? There are several types of cancer. Take the following steps to reduce your risk and to catch any cancer development as early as possible. Breast Cancer  Practice breast self-awareness. ? This means understanding how your breasts normally appear and feel. ? It also means doing regular breast self-exams. Let your health care provider know about any changes, no matter how small.  If you are 40  or older, have a clinician do a breast exam (clinical breast exam or CBE) every year. Depending on your age, family history, and medical history, it may be recommended that you also have a yearly breast X-ray (mammogram).  If you  have a family history of breast cancer, talk with your health care provider about genetic screening.  If you are at high risk for breast cancer, talk with your health care provider about having an MRI and a mammogram every year.  Breast cancer (BRCA) gene test is recommended for women who have family members with BRCA-related cancers. Results of the assessment will determine the need for genetic counseling and BRCA1 and for BRCA2 testing. BRCA-related cancers include these types: ? Breast. This occurs in males or females. ? Ovarian. ? Tubal. This may also be called fallopian tube cancer. ? Cancer of the abdominal or pelvic lining (peritoneal cancer). ? Prostate. ? Pancreatic.  Cervical, Uterine, and Ovarian Cancer Your health care provider may recommend that you be screened regularly for cancer of the pelvic organs. These include your ovaries, uterus, and vagina. This screening involves a pelvic exam, which includes checking for microscopic changes to the surface of your cervix (Pap test).  For women ages 21-65, health care providers may recommend a pelvic exam and a Pap test every three years. For women ages 79-65, they may recommend the Pap test and pelvic exam, combined with testing for human papilloma virus (HPV), every five years. Some types of HPV increase your risk of cervical cancer. Testing for HPV may also be done on women of any age who have unclear Pap test results.  Other health care providers may not recommend any screening for nonpregnant women who are considered low risk for pelvic cancer and have no symptoms. Ask your health care provider if a screening pelvic exam is right for you.  If you have had past treatment for cervical cancer or a condition that could lead to cancer, you need Pap tests and screening for cancer for at least 20 years after your treatment. If Pap tests have been discontinued for you, your risk factors (such as having a new sexual partner) need to be  reassessed to determine if you should start having screenings again. Some women have medical problems that increase the chance of getting cervical cancer. In these cases, your health care provider may recommend that you have screening and Pap tests more often.  If you have a family history of uterine cancer or ovarian cancer, talk with your health care provider about genetic screening.  If you have vaginal bleeding after reaching menopause, tell your health care provider.  There are currently no reliable tests available to screen for ovarian cancer.  Lung Cancer Lung cancer screening is recommended for adults 69-62 years old who are at high risk for lung cancer because of a history of smoking. A yearly low-dose CT scan of the lungs is recommended if you:  Currently smoke.  Have a history of at least 30 pack-years of smoking and you currently smoke or have quit within the past 15 years. A pack-year is smoking an average of one pack of cigarettes per day for one year.  Yearly screening should:  Continue until it has been 15 years since you quit.  Stop if you develop a health problem that would prevent you from having lung cancer treatment.  Colorectal Cancer  This type of cancer can be detected and can often be prevented.  Routine colorectal cancer screening usually begins at  age 42 and continues through age 45.  If you have risk factors for colon cancer, your health care provider may recommend that you be screened at an earlier age.  If you have a family history of colorectal cancer, talk with your health care provider about genetic screening.  Your health care provider may also recommend using home test kits to check for hidden blood in your stool.  A small camera at the end of a tube can be used to examine your colon directly (sigmoidoscopy or colonoscopy). This is done to check for the earliest forms of colorectal cancer.  Direct examination of the colon should be repeated every  5-10 years until age 71. However, if early forms of precancerous polyps or small growths are found or if you have a family history or genetic risk for colorectal cancer, you may need to be screened more often.  Skin Cancer  Check your skin from head to toe regularly.  Monitor any moles. Be sure to tell your health care provider: ? About any new moles or changes in moles, especially if there is a change in a mole's shape or color. ? If you have a mole that is larger than the size of a pencil eraser.  If any of your family members has a history of skin cancer, especially at a young age, talk with your health care provider about genetic screening.  Always use sunscreen. Apply sunscreen liberally and repeatedly throughout the day.  Whenever you are outside, protect yourself by wearing long sleeves, pants, a wide-brimmed hat, and sunglasses.  What should I know about osteoporosis? Osteoporosis is a condition in which bone destruction happens more quickly than new bone creation. After menopause, you may be at an increased risk for osteoporosis. To help prevent osteoporosis or the bone fractures that can happen because of osteoporosis, the following is recommended:  If you are 46-71 years old, get at least 1,000 mg of calcium and at least 600 mg of vitamin D per day.  If you are older than age 55 but younger than age 65, get at least 1,200 mg of calcium and at least 600 mg of vitamin D per day.  If you are older than age 54, get at least 1,200 mg of calcium and at least 800 mg of vitamin D per day.  Smoking and excessive alcohol intake increase the risk of osteoporosis. Eat foods that are rich in calcium and vitamin D, and do weight-bearing exercises several times each week as directed by your health care provider. What should I know about how menopause affects my mental health? Depression may occur at any age, but it is more common as you become older. Common symptoms of depression  include:  Low or sad mood.  Changes in sleep patterns.  Changes in appetite or eating patterns.  Feeling an overall lack of motivation or enjoyment of activities that you previously enjoyed.  Frequent crying spells.  Talk with your health care provider if you think that you are experiencing depression. What should I know about immunizations? It is important that you get and maintain your immunizations. These include:  Tetanus, diphtheria, and pertussis (Tdap) booster vaccine.  Influenza every year before the flu season begins.  Pneumonia vaccine.  Shingles vaccine.  Your health care provider may also recommend other immunizations. This information is not intended to replace advice given to you by your health care provider. Make sure you discuss any questions you have with your health care provider. Document Released: 05/06/2005  Document Revised: 10/02/2015 Document Reviewed: 12/16/2014 [employer]vier Interactive Patient Education  2018 [employer]  

## 2017-07-12 NOTE — Progress Notes (Signed)
Subjective:   Diamond Lucas is a 57 y.o. female who presents for Medicare Annual (Subsequent) preventive examination.  Review of Systems:  N/A  Cardiac Risk Factors include: advanced age (>35men, >1 women);hypertension     Objective:     Vitals: BP 112/84 (BP Location: Left Arm)   Pulse 72   Temp 97.8 F (36.6 C) (Oral)   Ht 5' (1.524 m)   Wt 120 lb 3.2 oz (54.5 kg)   LMP 04/10/2015 Comment: Only spotting  BMI 23.47 kg/m   Body mass index is 23.47 kg/m.  Advanced Directives 07/12/2017 01/17/2017 09/08/2016 06/29/2016 02/11/2016 04/13/2015 12/23/2014  Does Patient Have a Medical Advance Directive? No Yes No Yes Yes Yes Yes  Type of Advance Directive - - - Living will - [place];Living will Living will  Would patient like information on creating a medical advance directive? No - Patient declined - No - Patient declined - - - -  Some encounter information is confidential and restricted. Go to Review Flowsheets activity to see all data.    Tobacco Social History   Tobacco Use  Smoking Status Never Smoker  Smokeless Tobacco Never Used     Counseling given: Not Answered   Clinical Intake:  Pre-visit preparation completed: Yes  Pain : No/denies pain Pain Score: 0-No pain     Nutritional Status: BMI of 19-24  Normal Nutritional Risks: Nausea/ vomitting/ diarrhea(pt has nausea every morning, unsure of cause) Diabetes: No  How often do you need to have someone help you when you read instructions, pamphlets, or other written materials from your doctor or pharmacy?: 1 - Never  Interpreter Needed?: No  Information entered by :: [facility], LPN  Past Medical History:  Diagnosis Date  . Allergy   . Arrhythmia   . History of chicken pox   . History of measles   . History of syncope   . Osteoarthritis   . Rosacea   . Schizophrenia ([place])   . Thyroid disease    Past Surgical History:  Procedure Laterality Date  . FOOT SURGERY Right   .  THYROIDECTOMY     Family History  Problem Relation Age of Onset  . Depression Mother   . Cataracts Mother   . Stroke Mother        mini  . Cancer Other   . Heart failure Other   . Heart disease Other   . Stroke Other   . Diabetes Other   . Kidney cancer Father   . Anxiety disorder Brother    Social History   Socioeconomic History  . Marital status: Single    Spouse name: Not on file  . Number of children: 0  . Years of education: Not on file  . Highest education level: Bachelor's degree (e.g., BA, AB, BS)  Occupational History  . 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 Financial resource strain: Not hard at all  . Food insecurity:    Worry: Never true    Inability: Never true  . Transportation needs:    Medical: No    Non-medical: No  Tobacco Use  . Smoking status: Never Smoker  . Smokeless tobacco: Never Used  Substance and Sexual Activity  . Alcohol use: No  . Drug use: No  . Sexual activity: Not Currently    Birth control/protection: Post-menopausal, Abstinence  Lifestyle  . Physical activity:    Days per week: Not  on file    Minutes per session: Not on file  . Stress: Only a little  Relationships  . Social connections:    Talks on phone: Not on file    Gets together: Not on file    Attends religious service: Not on file    Active member of club or organization: Not on file    Attends meetings of clubs or organizations: Not on file    Relationship status: Not on file  Other Topics Concern  . Not on file  Social History Narrative  . Not on file    Outpatient Encounter Medications as of 07/12/2017  Medication Sig  . Calcium Carbonate-Vit D-Min (CALCIUM 600+D PLUS MINERALS) 600-400 MG-UNIT TABS Take by mouth daily.   . Carboxymeth-Glyc-Polysorb PF (REFRESH OPTIVE MEGA-3) 0.5-1-0.5 % SOLN Apply 2 drops to eye 2 (two) times daily.  Marland Kitchen ibuprofen (ADVIL,MOTRIN) 600 MG tablet Take 1 tablet (600 mg  total) by mouth every 8 (eight) hours as needed.  Marland Kitchen levothyroxine (SYNTHROID, LEVOTHROID) 75 MCG tablet TAKE 1 TABLET BY MOUTH DAILY  . Multiple Vitamin (MULTI-VITAMINS) TABS daily.   . Omega-3 Fatty Acids (FISH OIL) 1000 MG CAPS Take by mouth daily.  . risperiDONE (RISPERDAL) 2 MG tablet Take 1 tablet (2 mg total) by mouth at bedtime.  . triamcinolone (NASACORT ALLERGY 24HR) 55 MCG/ACT AERO nasal inhaler Place 2 sprays into the nose daily.  Marland Kitchen triamcinolone cream (KENALOG) 0.1 % Apply 1 application topically 2 (two) times daily. (Patient taking differently: Apply 1 application topically 2 (two) times daily. )  . meclizine (ANTIVERT) 12.5 MG tablet Take 1 tablet (12.5 mg total) by mouth 3 (three) times daily as needed for dizziness. (Patient not taking: Reported on 04/13/2017)   No facility-administered encounter medications on file as of 07/12/2017.     Activities of Daily Living In your present state of health, do you have any difficulty performing the following activities: 07/12/2017  Hearing? N  Vision? N  Difficulty concentrating or making decisions? Y  Walking or climbing stairs? N  Dressing or bathing? N  Doing errands, shopping? N  Preparing Food and eating ? N  Using the Toilet? N  In the past six months, have you accidently leaked urine? N  Do you have problems with loss of bowel control? N  Managing your Medications? N  Managing your Finances? N  Housekeeping or managing your Housekeeping? N  Some recent data might be hidden    Patient Care Team: Rubye Beach as PCP - General (Physician Assistant) [name], MD as Referring Physician (Obstetrics and Gynecology) Birder Robson, MD as Referring Physician (Ophthalmology) Ward, Honor Loh, MD as Referring Physician (Obstetrics and Gynecology) Charlcie Cradle, MD as Consulting Physician (Psychiatry)    Assessment:   This is a routine wellness examination for [name].  Exercise Activities and Dietary  recommendations Current Exercise Habits: Structured exercise class(Stays on feet at job), Type of exercise: strength training/weights, Time (Minutes): 35, Frequency (Times/Week): 1, Weekly Exercise (Minutes/Week): 35, Intensity: Mild, Exercise limited by: None identified  Goals    . DIET - INCREASE WATER INTAKE     Recommend increasing water intake to 6 glasses a day.        Fall Risk Fall Risk  07/12/2017 06/29/2016  Falls in the past year? No Yes  Number falls in past yr: - 1  Injury with Fall? - Yes  Comment - stitches, fractured jaw  Risk for fall due to : - Medication side effect  Risk  for fall due to: Comment - due to medication given in hospital  Follow up - Falls prevention discussed   Is the patient's home free of loose throw rugs in walkways, pet beds, electrical cords, etc?   yes      Grab bars in the bathroom? no      Handrails on the stairs?   no      Adequate lighting?   yes  Timed Get Up and Go performed: N/A  Depression Screen PHQ 2/9 Scores 07/12/2017 06/29/2016  PHQ - 2 Score 1 1     Cognitive Function MMSE - Mini Mental State Exam 04/13/2017  Orientation to time 5  Orientation to Place 5  Registration 3  Attention/ Calculation 5  Recall 3  Language- name 2 objects 2  Language- repeat 1  Language- follow 3 step command 3  Language- read & follow direction 1  Write a sentence 1  Copy design 1  Total score 30     6CIT Screen 07/12/2017 04/13/2017 06/29/2016  What Year? 0 points 0 points 0 points  What month? 0 points 0 points 0 points  What time? 0 points 0 points 0 points  Count back from 20 0 points 0 points 0 points  Months in reverse 0 points 0 points 0 points  Repeat phrase 0 points 0 points 0 points  Total Score 0 0 0    Immunization History  Administered Date(s) Administered  . HPV Quadrivalent 10/02/2012  . Influenza,inj,Quad PF,6+ Mos 08/20/2012, 01/17/2017  . PPD Test 06/09/2014  . Tetanus 12/26/2005  . Varicella Zoster Immune Globulin  12/03/2014    Qualifies for Shingles Vaccine? N/A  Screening Tests Health Maintenance  Topic Date Due  . Hepatitis C Screening  May 14, 1960  . HIV Screening  01/06/1978  . MAMMOGRAM  01/06/2013  . TETANUS/TDAP  12/27/2015  . INFLUENZA VACCINE  10/26/2017  . PAP SMEAR  08/27/2018  . COLONOSCOPY  09/30/2019    Cancer Screenings: Lung: Low Dose CT Chest recommended if Age 77-80 years, 30 pack-year currently smoking OR have quit w/in 15years. Patient does not qualify. Breast:  Up to date on Mammogram? Yes   Up to date of Bone Density/Dexa? N/A Colorectal: Up to date  Additional Screenings: Hepatitis C Screening: Ordered and obtained today.     Plan:  I have personally reviewed and addressed the Medicare Annual Wellness questionnaire and have noted the following in the patient's chart:  A. Medical and social history B. Use of alcohol, tobacco or illicit drugs  C. Current medications and supplements D. Functional ability and status E.  Nutritional status F.  Physical activity G. Advance directives H. List of other physicians I.  Hospitalizations, surgeries, and ER visits in previous 12 months J.  [place] such as hearing and vision if needed, cognitive and depression L. Referrals and appointments - none  In addition, I have reviewed and discussed with patient certain preventive protocols, quality metrics, and best practice recommendations. A written personalized care plan for preventive services as well as general preventive health recommendations were provided to patient.  See attached scanned questionnaire for additional information.   Signed,  Fabio Neighbors, LPN Nurse Health Advisor   Nurse Recommendations: Ordered the Hep C and HIV labs today. Pt to check with insurance about coverage for a tetanus vaccine. Pt to set up a mammogram this year.

## 2017-07-12 NOTE — Progress Notes (Signed)
Patient: Diamond Lucas Female    DOB: Dec 04, 1960   57 y.o.   MRN: [id number] Visit Date: 07/12/2017  Today's Provider: Mar Daring, PA-C   No chief complaint on file.  Subjective:    HPI   Annual physical exam Diamond Lucas is a 57 y.o. female who presents today for health maintenance and complete physical. She feels fairly well. She reports exercising.Structured exercise class (Stays on feet at job), Type of exercise: strength training/weights, Time (Minutes): 35, Frequency (Times/Week): 1, Weekly Exercise (Minutes/Week): 35, Intensity: Mild.She reports she is sleeping fairly well.  Patient had AWV today with [facility]. She report that she is here to get her cholesterol check.     Allergies  Allergen Reactions  . Diclofenac Sodium Palpitations  . Naproxen Itching, Rash and Hives  . Olanzapine     Other reaction(s): Other Cross-eyed and hyperventilation  . Dust Mite Mixed Allergen Ext [Mite (D. Farinae)] Itching    Respiratory illness, and itchy eyes, eye problems  . Gluten Meal Other (See Comments)    Abdominal bloating  . Nsaids     Other reaction(s): Other (See Comments) Aleve causes hives  . Other Itching    Respiratory illness, and itchy eyes, eye problems Respiratory illness, and itchy eyes, eye problems     Current Outpatient Medications:  .  Calcium Carbonate-Vit D-Min (CALCIUM 600+D PLUS MINERALS) 600-400 MG-UNIT TABS, Take by mouth daily. , Disp: , Rfl:  .  Carboxymeth-Glyc-Polysorb PF (REFRESH OPTIVE MEGA-3) 0.5-1-0.5 % SOLN, Apply 2 drops to eye 2 (two) times daily., Disp: , Rfl:  .  ibuprofen (ADVIL,MOTRIN) 600 MG tablet, Take 1 tablet (600 mg total) by mouth every 8 (eight) hours as needed., Disp: 30 tablet, Rfl: 0 .  levothyroxine (SYNTHROID, LEVOTHROID) 75 MCG tablet, TAKE 1 TABLET BY MOUTH DAILY, Disp: , Rfl:  .  meclizine (ANTIVERT) 12.5 MG tablet, Take 1 tablet (12.5 mg total) by mouth 3 (three) times daily as needed for dizziness.  (Patient not taking: Reported on 04/13/2017), Disp: 30 tablet, Rfl: 3 .  Multiple Vitamin (MULTI-VITAMINS) TABS, daily. , Disp: , Rfl:  .  Omega-3 Fatty Acids (FISH OIL) 1000 MG CAPS, Take by mouth daily., Disp: , Rfl:  .  risperiDONE (RISPERDAL) 2 MG tablet, Take 1 tablet (2 mg total) by mouth at bedtime., Disp: 30 tablet, Rfl: 0 .  triamcinolone (NASACORT ALLERGY 24HR) 55 MCG/ACT AERO nasal inhaler, Place 2 sprays into the nose daily., Disp: , Rfl:  .  triamcinolone cream (KENALOG) 0.1 %, Apply 1 application topically 2 (two) times daily. (Patient taking differently: Apply 1 application topically 2 (two) times daily. ), Disp: 80 g, Rfl: 0  Review of Systems  Constitutional: Positive for chills and fatigue.  HENT: Positive for postnasal drip and rhinorrhea.   Eyes: Negative.   Respiratory: Negative.   Gastrointestinal: Positive for abdominal distention, constipation, nausea and vomiting.  Endocrine: Positive for cold intolerance.  Genitourinary: Negative.   Musculoskeletal: Positive for gait problem ("Foot is taped"), neck pain and neck stiffness.  Skin: Negative.   Allergic/Immunologic: Positive for environmental allergies.  Neurological: Positive for facial asymmetry and weakness.  Psychiatric/Behavioral: Positive for dysphoric mood.    Social History   Tobacco Use  . Smoking status: Never Smoker  . Smokeless tobacco: Never Used  Substance Use Topics  . Alcohol use: No   Fall Risk  07/12/2017 06/29/2016  Falls in the past year? No Yes  Number falls in past yr: -  1  Injury with Fall? - Yes  Comment - stitches, fractured jaw  Risk for fall due to : - Medication side effect  Risk for fall due to: Comment - due to medication given in hospital  Follow up - Falls prevention discussed   6CIT Screen 07/12/2017 04/13/2017 06/29/2016  What Year? 0 points 0 points 0 points  What month? 0 points 0 points 0 points  What time? 0 points 0 points 0 points  Count back from 20 0 points 0 points 0  points  Months in reverse 0 points 0 points 0 points  Repeat phrase 0 points 0 points 0 points  Total Score 0 0 0   Depression screen [facility] 2/9 07/12/2017 06/29/2016  Decreased Interest 0 0  Down, Depressed, Hopeless 1 1  PHQ - 2 Score 1 1        Objective:  Vitals: BP 112/84 (BP Location: Left Arm)   Pulse 72   Temp 97.8 F (36.6 C) (Oral)   Ht 5' (1.524 m)   Wt 120 lb 3.2 oz (54.5 kg)   LMP 04/10/2015 Comment: Only spotting  BMI 23.47 kg/m   Body mass index is 23.47 kg/m.   Physical Exam  Constitutional: She is oriented to person, place, and time. She appears well-developed and well-nourished. No distress.  HENT:  Head: Normocephalic and atraumatic.  Right Ear: Hearing, tympanic membrane, external ear and ear canal normal.  Left Ear: Hearing, tympanic membrane, external ear and ear canal normal.  Nose: Nose normal.  Mouth/Throat: Uvula is midline, oropharynx is clear and moist and mucous membranes are normal. No oropharyngeal exudate.  Eyes: Pupils are equal, round, and reactive to light. Conjunctivae and EOM are normal. Right eye exhibits no discharge. Left eye exhibits no discharge. No scleral icterus.  Neck: Normal range of motion. Neck supple. No JVD present. No tracheal deviation present. No thyromegaly present.  Cardiovascular: Normal rate, regular rhythm, normal heart sounds and intact distal pulses. Exam reveals no gallop and no friction rub.  No murmur heard. Pulmonary/Chest: Effort normal and breath sounds normal. No respiratory distress. She has no wheezes. She has no rales. She exhibits no tenderness.  Abdominal: Soft. Bowel sounds are normal. She exhibits no distension and no mass. There is no tenderness. There is no rebound and no guarding.  Musculoskeletal: Normal range of motion. She exhibits no edema or tenderness.  Lymphadenopathy:    She has no cervical adenopathy.  Neurological: She is alert and oriented to person, place, and time.  Skin: Skin is warm and  dry. No rash noted. She is not diaphoretic.  Psychiatric: She has a normal mood and affect. Her behavior is normal. Judgment and thought content normal.  Vitals reviewed.      Assessment & Plan:     1. Annual physical exam Normal physical exam today. Will check labs as below and f/u pending lab results. If labs are stable and WNL she will not need to have these rechecked for one year at her next annual physical exam. She is to call the office in the meantime if she has any acute issue, questions or concerns. - CBC with Differential/Platelet - Comprehensive metabolic panel - Lipid panel  2. Breast cancer screening There is no family history of breast cancer. She does perform regular self breast exams. Mammogram was ordered as below. Information for [facility] was given to patient so she may schedule her mammogram at her convenience. - MM Digital Screening; Future  3. Essential hypertension  Stable. Will check labs as below and f/u pending results. - CBC with Differential/Platelet - Comprehensive metabolic panel - Lipid panel  4. Postoperative hypothyroidism Will check labs as below and f/u pending results. Followed by Endocrine with [facility]. - TSH       Mar Daring, PA-C  [facility]

## 2017-07-12 NOTE — Patient Instructions (Signed)
Diamond Lucas , Thank you for taking time to come for your Medicare Wellness Visit. I appreciate your ongoing commitment to your health goals. Please review the following plan we discussed and let me know if I can assist you in the future.   Screening recommendations/referrals: Colonoscopy: Up to date Mammogram: Pt plans to set up apt this year. Bone Density: N/A Recommended yearly ophthalmology/optometry visit for glaucoma screening and checkup Recommended yearly dental visit for hygiene and checkup  Vaccinations: Influenza vaccine: Up to date Pneumococcal vaccine: N/A Tdap vaccine: Pt declines today.  Shingles vaccine: N/A    Advanced directives: Advance directive discussed with you today. Even though you declined this today please call our office should you change your mind and we can give you the proper paperwork for you to fill out.  Conditions/risks identified: Recommend increasing water intake to 6 glasses a day.   Next appointment: 3:20 PM today with Fenton Malling.  Preventive Care 40-64 Years, Female Preventive care refers to lifestyle choices and visits with your health care provider that can promote health and wellness. What does preventive care include?  A yearly physical exam. This is also called an annual well check.  Dental exams once or twice a year.  Routine eye exams. Ask your health care provider how often you should have your eyes checked.  Personal lifestyle choices, including:  Daily care of your teeth and gums.  Regular physical activity.  Eating a healthy diet.  Avoiding tobacco and drug use.  Limiting alcohol use.  Practicing safe sex.  Taking low-dose aspirin daily starting at age 58.  Taking vitamin and mineral supplements as recommended by your health care provider. What happens during an annual well check? The services and screenings done by your health care provider during your annual well check will depend on your age, overall  health, lifestyle risk factors, and family history of disease. Counseling  Your health care provider may ask you questions about your:  Alcohol use.  Tobacco use.  Drug use.  Emotional well-being.  Home and relationship well-being.  Sexual activity.  Eating habits.  Work and work Statistician.  Method of birth control.  Menstrual cycle.  Pregnancy history. Screening  You may have the following tests or measurements:  Height, weight, and BMI.  Blood pressure.  Lipid and cholesterol levels. These may be checked every 5 years, or more frequently if you are over 63 years old.  Skin check.  Lung cancer screening. You may have this screening every year starting at age 46 if you have a 30-pack-year history of smoking and currently smoke or have quit within the past 15 years.  Fecal occult blood test (FOBT) of the stool. You may have this test every year starting at age 72.  Flexible sigmoidoscopy or colonoscopy. You may have a sigmoidoscopy every 5 years or a colonoscopy every 10 years starting at age 42.  Hepatitis C blood test.  Hepatitis B blood test.  Sexually transmitted disease (STD) testing.  Diabetes screening. This is done by checking your blood sugar (glucose) after you have not eaten for a while (fasting). You may have this done every 1-3 years.  Mammogram. This may be done every 1-2 years. Talk to your health care provider about when you should start having regular mammograms. This may depend on whether you have a family history of breast cancer.  BRCA-related cancer screening. This may be done if you have a family history of breast, ovarian, tubal, or peritoneal cancers.  Pelvic exam  and Pap test. This may be done every 3 years starting at age 5. Starting at age 60, this may be done every 5 years if you have a Pap test in combination with an HPV test.  Bone density scan. This is done to screen for osteoporosis. You may have this scan if you are at high  risk for osteoporosis. Discuss your test results, treatment options, and if necessary, the need for more tests with your health care provider. Vaccines  Your health care provider may recommend certain vaccines, such as:  Influenza vaccine. This is recommended every year.  Tetanus, diphtheria, and acellular pertussis (Tdap, Td) vaccine. You may need a Td booster every 10 years.  Zoster vaccine. You may need this after age 81.  Pneumococcal 13-valent conjugate (PCV13) vaccine. You may need this if you have certain conditions and were not previously vaccinated.  Pneumococcal polysaccharide (PPSV23) vaccine. You may need one or two doses if you smoke cigarettes or if you have certain conditions. Talk to your health care provider about which screenings and vaccines you need and how often you need them. This information is not intended to replace advice given to you by your health care provider. Make sure you discuss any questions you have with your health care provider. Document Released: 04/10/2015 Document Revised: 12/02/2015 Document Reviewed: 01/13/2015 Elsevier Interactive Patient Education  2017 [place] Prevention in the Home Falls can cause injuries. They can happen to people of all ages. There are many things you can do to make your home safe and to help prevent falls. What can I do on the outside of my home?  Regularly fix the edges of walkways and driveways and fix any cracks.  Remove anything that might make you trip as you walk through a door, such as a raised step or threshold.  Trim any bushes or trees on the path to your home.  Use bright outdoor lighting.  Clear any walking paths of anything that might make someone trip, such as rocks or tools.  Regularly check to see if handrails are loose or broken. Make sure that both sides of any steps have handrails.  Any raised decks and porches should have guardrails on the edges.  Have any leaves, snow, or ice  cleared regularly.  Use sand or salt on walking paths during winter.  Clean up any spills in your garage right away. This includes oil or grease spills. What can I do in the bathroom?  Use night lights.  Install grab bars by the toilet and in the tub and shower. Do not use towel bars as grab bars.  Use non-skid mats or decals in the tub or shower.  If you need to sit down in the shower, use a plastic, non-slip stool.  Keep the floor dry. Clean up any water that spills on the floor as soon as it happens.  Remove soap buildup in the tub or shower regularly.  Attach bath mats securely with double-sided non-slip rug tape.  Do not have throw rugs and other things on the floor that can make you trip. What can I do in the bedroom?  Use night lights.  Make sure that you have a light by your bed that is easy to reach.  Do not use any sheets or blankets that are too big for your bed. They should not hang down onto the floor.  Have a firm chair that has side arms. You can use this for support while  you get dressed.  Do not have throw rugs and other things on the floor that can make you trip. What can I do in the kitchen?  Clean up any spills right away.  Avoid walking on wet floors.  Keep items that you use a lot in easy-to-reach places.  If you need to reach something above you, use a strong step stool that has a grab bar.  Keep electrical cords out of the way.  Do not use floor polish or wax that makes floors slippery. If you must use wax, use non-skid floor wax.  Do not have throw rugs and other things on the floor that can make you trip. What can I do with my stairs?  Do not leave any items on the stairs.  Make sure that there are handrails on both sides of the stairs and use them. Fix handrails that are broken or loose. Make sure that handrails are as long as the stairways.  Check any carpeting to make sure that it is firmly attached to the stairs. Fix any carpet that  is loose or worn.  Avoid having throw rugs at the top or bottom of the stairs. If you do have throw rugs, attach them to the floor with carpet tape.  Make sure that you have a light switch at the top of the stairs and the bottom of the stairs. If you do not have them, ask someone to add them for you. What else can I do to help prevent falls?  Wear shoes that:  Do not have high heels.  Have rubber bottoms.  Are comfortable and fit you well.  Are closed at the toe. Do not wear sandals.  If you use a stepladder:  Make sure that it is fully opened. Do not climb a closed stepladder.  Make sure that both sides of the stepladder are locked into place.  Ask someone to hold it for you, if possible.  Clearly mark and make sure that you can see:  Any grab bars or handrails.  First and last steps.  Where the edge of each step is.  Use tools that help you move around (mobility aids) if they are needed. These include:  Canes.  Walkers.  Scooters.  Crutches.  Turn on the lights when you go into a dark area. Replace any light bulbs as soon as they burn out.  Set up your furniture so you have a clear path. Avoid moving your furniture around.  If any of your floors are uneven, fix them.  If there are any pets around you, be aware of where they are.  Review your medicines with your doctor. Some medicines can make you feel dizzy. This can increase your chance of falling. Ask your doctor what other things that you can do to help prevent falls. This information is not intended to replace advice given to you by your health care provider. Make sure you discuss any questions you have with your health care provider. Document Released: 01/08/2009 Document Revised: 08/20/2015 Document Reviewed: 04/18/2014 Elsevier Interactive Patient Education  2017 [employer].

## 2017-07-13 ENCOUNTER — Encounter: Payer: Self-pay | Admitting: Physician Assistant

## 2017-07-13 LAB — HEPATITIS C ANTIBODY: Hep C Virus Ab: <0.1 {s_co_ratio} (ref 0.0–0.9)

## 2017-07-13 LAB — CBC WITH DIFFERENTIAL/PLATELET
Basophils Absolute: 0 10*3/uL (ref 0.0–0.2)
Basos: 0 %
EOS (ABSOLUTE): 0.1 10*3/uL (ref 0.0–0.4)
Eos: 1 %
Hematocrit: 42.2 % (ref 34.0–46.6)
Hemoglobin: 14 g/dL (ref 11.1–15.9)
Immature Grans (Abs): 0 10*3/uL (ref 0.0–0.1)
Immature Granulocytes: 0 %
Lymphocytes Absolute: 1.8 10*3/uL (ref 0.7–3.1)
Lymphs: 24 %
MCH: 31.2 pg (ref 26.6–33.0)
MCHC: 33.2 g/dL (ref 31.5–35.7)
MCV: 94 fL (ref 79–97)
Monocytes Absolute: 0.6 10*3/uL (ref 0.1–0.9)
Monocytes: 8 %
Neutrophils Absolute: 5.1 10*3/uL (ref 1.4–7.0)
Neutrophils: 67 %
Platelets: 231 10*3/uL (ref 150–379)
RBC: 4.49 x10E6/uL (ref 3.77–5.28)
RDW: 13.2 % (ref 12.3–15.4)
WBC: 7.6 10*3/uL (ref 3.4–10.8)

## 2017-07-13 LAB — COMPREHENSIVE METABOLIC PANEL
ALBUMIN: 4.4 g/dL (ref 3.5–5.5)
ALK PHOS: 81 IU/L (ref 39–117)
ALT: 13 IU/L (ref 0–32)
AST: 17 IU/L (ref 0–40)
Albumin/Globulin Ratio: 1.6 (ref 1.2–2.2)
BUN / CREAT RATIO: 14 (ref 9–23)
BUN: 13 mg/dL (ref 6–24)
Bilirubin Total: 0.4 mg/dL (ref 0.0–1.2)
CALCIUM: 9.2 mg/dL (ref 8.7–10.2)
CO2: 25 mmol/L (ref 20–29)
CREATININE: 0.96 mg/dL (ref 0.57–1.00)
Chloride: 100 mmol/L (ref 96–106)
GFR calc Af Amer: 78 mL/min/{1.73_m2} (ref >59)
GFR calc non Af Amer: 67 mL/min/{1.73_m2} (ref >59)
GLOBULIN, TOTAL: 2.7 g/dL (ref 1.5–4.5)
GLUCOSE: 84 mg/dL (ref 65–99)
Potassium: 4.1 mmol/L (ref 3.5–5.2)
SODIUM: 142 mmol/L (ref 134–144)
Total Protein: 7.1 g/dL (ref 6.0–8.5)

## 2017-07-13 LAB — LIPID PANEL
CHOLESTEROL TOTAL: 205 mg/dL — AB (ref 100–199)
Chol/HDL Ratio: 2.4 ratio (ref 0.0–4.4)
HDL: 85 mg/dL (ref >39)
LDL CALC: 107 mg/dL — AB (ref 0–99)
TRIGLYCERIDES: 64 mg/dL (ref 0–149)
VLDL CHOLESTEROL CAL: 13 mg/dL (ref 5–40)

## 2017-07-13 LAB — TSH: TSH: 0.63 u[IU]/mL (ref 0.450–4.500)

## 2017-07-13 LAB — HIV ANTIBODY (ROUTINE TESTING W REFLEX): HIV Screen 4th Generation wRfx: NONREACTIVE

## 2017-07-20 ENCOUNTER — Ambulatory Visit (INDEPENDENT_AMBULATORY_CARE_PROVIDER_SITE_OTHER): Payer: Medicare Other | Admitting: Physician Assistant

## 2017-07-20 DIAGNOSIS — Z23 Encounter for immunization: Secondary | ICD-10-CM

## 2017-07-20 NOTE — Progress Notes (Signed)
Patient reports that she checked with her insurance and states that TD will only be covered, not Tdap. Waiver was also signed.

## 2017-07-21 ENCOUNTER — Encounter: Payer: Self-pay | Admitting: Obstetrics and Gynecology

## 2017-07-28 DIAGNOSIS — Z79899 Other long term (current) drug therapy: Secondary | ICD-10-CM | POA: Diagnosis not present

## 2017-07-28 DIAGNOSIS — E890 Postprocedural hypothyroidism: Secondary | ICD-10-CM | POA: Diagnosis not present

## 2017-07-28 DIAGNOSIS — C73 Malignant neoplasm of thyroid gland: Secondary | ICD-10-CM | POA: Diagnosis not present

## 2017-08-01 ENCOUNTER — Encounter: Payer: Self-pay | Admitting: Obstetrics and Gynecology

## 2017-08-01 DIAGNOSIS — F4323 Adjustment disorder with mixed anxiety and depressed mood: Secondary | ICD-10-CM | POA: Diagnosis not present

## 2017-08-10 ENCOUNTER — Encounter: Payer: Self-pay | Admitting: Physician Assistant

## 2017-08-11 ENCOUNTER — Encounter: Payer: Self-pay | Admitting: Obstetrics and Gynecology

## 2017-08-15 DIAGNOSIS — F4323 Adjustment disorder with mixed anxiety and depressed mood: Secondary | ICD-10-CM | POA: Diagnosis not present

## 2017-08-20 ENCOUNTER — Encounter: Payer: Self-pay | Admitting: Physician Assistant

## 2017-08-27 ENCOUNTER — Encounter: Payer: Self-pay | Admitting: Obstetrics and Gynecology

## 2017-08-30 ENCOUNTER — Ambulatory Visit (INDEPENDENT_AMBULATORY_CARE_PROVIDER_SITE_OTHER): Payer: Medicare Other | Admitting: Obstetrics & Gynecology

## 2017-08-30 ENCOUNTER — Encounter: Payer: Self-pay | Admitting: Obstetrics & Gynecology

## 2017-08-30 ENCOUNTER — Encounter: Payer: Self-pay | Admitting: Obstetrics and Gynecology

## 2017-08-30 VITALS — BP 120/70 | Ht 61.0 in | Wt 117.0 lb

## 2017-08-30 DIAGNOSIS — R1084 Generalized abdominal pain: Secondary | ICD-10-CM

## 2017-08-30 LAB — POCT URINE PREGNANCY: Preg Test, Ur: NEGATIVE

## 2017-08-30 NOTE — Progress Notes (Signed)
Abdominal Pain Patient presents for evaluation of abdominal pain. The pain is described as lower abdomen and mild but feels like she is pregnant.  In fact, pt says she feels she is pregnant from bats that live in her apartment.  Pt has had these concerns in past and has seen doctors at [facility] and [facility] for similar concerns.  Known to have schizophrenia and may be part of delusional disorder.  Pain is 5/10 in intensity. Pain is located in the suprapubic area without radiation. Onset was gradual occurring a few months ago. Symptoms have been unchanged since. Aggravating factors: none. Alleviating factors: none. Associated symptoms: none. The patient denies constipation, diarrhea, fever, nausea and vomiting. Risk factors for pelvic/abdominal pain include none.  Pt has not had a period in 3 years.  Rare hot flash.  PMHx: She  has a past medical history of Allergy, Arrhythmia, History of chicken pox, History of measles, History of syncope, Osteoarthritis, Rosacea, Schizophrenia ([place]), and Thyroid disease. Also,  has a past surgical history that includes Thyroidectomy and Foot surgery (Right)., family history includes Anxiety disorder in her brother; Cancer in her other; Cataracts in her mother; Depression in her mother; Diabetes in her other; Heart disease in her other; Heart failure in her other; Kidney cancer in her father; Stroke in her mother and other.,  reports that she has never smoked. She has never used smokeless tobacco. She reports that she does not drink alcohol or use drugs.  She has a current medication list which includes the following prescription(s): calcium 600+d plus minerals, carboxymeth-glyc-polysorb pf, ibuprofen, levothyroxine, meclizine, multi-vitamins, fish oil, risperidone, triamcinolone, and triamcinolone cream. Also, is allergic to diclofenac sodium; naproxen; olanzapine; dust mite mixed allergen ext [mite (d. farinae)]; gluten meal; nsaids; and other.  Review of Systems  All other  systems reviewed and are negative.   Objective: BP 120/70   Ht 5\' 1"  (1.549 m)   Wt 117 lb (53.1 kg)   LMP 04/10/2015 Comment: Only spotting  BMI 22.11 kg/m  Physical Exam  Constitutional: She is oriented to person, place, and time. She appears well-developed and well-nourished. No distress.  Genitourinary: Vagina normal and uterus normal. Pelvic exam was performed with patient supine. There is no rash, tenderness or lesion on the right labia. There is no rash, tenderness or lesion on the left labia. No erythema or bleeding in the vagina. Right adnexum does not display mass and does not display tenderness. Left adnexum does not display mass and does not display tenderness. Cervix does not exhibit motion tenderness, discharge, polyp or nabothian cyst.   Uterus is mobile and midaxial. Uterus is not enlarged or exhibiting a mass.  HENT:  Head: Normocephalic and atraumatic.  Nose: Nose normal.  Mouth/Throat: Oropharynx is clear and moist.  Abdominal: Soft. She exhibits no distension and no mass. There is no tenderness. There is no rebound and no guarding.  Musculoskeletal: Normal range of motion.  Neurological: She is alert and oriented to person, place, and time. No cranial nerve deficit.  Skin: Skin is warm and dry.  Psychiatric: She has a normal mood and affect.   UPT NEG  ASSESSMENT/PLAN:      Generalized abdominal pain    -  Primary   Relevant Orders   POCT urine pregnancy (Completed)    No sign of mass, pregnancy, or other concern Counseled as such Concern over delusional disorder and need to see psychiatry and/or PCP.  Pt agrees to comply.  Pt is in no apparent harmful situation  at this time.  A total of 45 minutes were spent face-to-face with the patient during this encounter and over half of that time dealt with counseling and coordination of care.  Barnett Applebaum, MD, [facility], [facility] 08/30/2017  11:25 AM

## 2017-08-30 NOTE — Patient Instructions (Addendum)
Not pregnant today Plan on follow up with psychiatrist or Fenton Malling, PA at [facility]

## 2017-09-08 ENCOUNTER — Encounter: Payer: Self-pay | Admitting: Obstetrics and Gynecology

## 2017-09-08 ENCOUNTER — Encounter: Payer: Self-pay | Admitting: Physician Assistant

## 2017-09-08 ENCOUNTER — Ambulatory Visit (INDEPENDENT_AMBULATORY_CARE_PROVIDER_SITE_OTHER): Payer: Medicare Other | Admitting: Physician Assistant

## 2017-09-08 VITALS — BP 120/78 | HR 78 | Temp 97.9°F | Resp 16 | Ht 61.0 in | Wt 120.0 lb

## 2017-09-08 DIAGNOSIS — I872 Venous insufficiency (chronic) (peripheral): Secondary | ICD-10-CM

## 2017-09-08 NOTE — Patient Instructions (Signed)
[facility]  Chronic Venous Insufficiency Chronic venous insufficiency, also called venous stasis, is a condition that prevents blood from being pumped effectively through the veins in your legs. Blood may no longer be pumped effectively from the legs back to the heart. This condition can range from mild to severe. With proper treatment, you should be able to continue with an active life. What are the causes? Chronic venous insufficiency occurs when the vein walls become stretched, weakened, or damaged, or when valves within the vein are damaged. Some common causes of this include:  High blood pressure inside the veins (venous hypertension).  Increased blood pressure in the leg veins from long periods of sitting or standing.  A blood clot that blocks blood flow in a vein (deep vein thrombosis, DVT).  Inflammation of a vein (phlebitis) that causes a blood clot to form.  Tumors in the pelvis that cause blood to back up.  What increases the risk? The following factors may make you more likely to develop this condition:  Having a family history of this condition.  Obesity.  Pregnancy.  Living without enough physical activity or exercise (sedentary lifestyle).  Smoking.  Having a job that requires long periods of standing or sitting in one place.  Being a certain age. Women in their 57s and 54s and men in their 32s are more likely to develop this condition.  What are the signs or symptoms? Symptoms of this condition include:  Veins that are enlarged, bulging, or twisted (varicose veins).  Skin breakdown or ulcers.  Reddened or discolored skin on the front of the leg.  Brown, smooth, tight, and painful skin just above the ankle, usually on the inside of the leg (lipodermatosclerosis).  Swelling.  How is this diagnosed? This condition may be diagnosed based on:  Your medical history.  A physical exam.  Tests, such as: ? A procedure that creates an  image of a blood vessel and nearby organs and provides information about blood flow through the blood vessel (duplex ultrasound). ? A procedure that tests blood flow (plethysmography). ? A procedure to look at the veins using X-ray and dye (venogram).  How is this treated? The goals of treatment are to help you return to an active life and to minimize pain or disability. Treatment depends on the severity of your condition, and it may include:  Wearing compression stockings. These can help relieve symptoms and help prevent your condition from getting worse. However, they do not cure the condition.  Sclerotherapy. This is a procedure involving an injection of a material that "dissolves" damaged veins.  Surgery. This may involve: ? Removing a diseased vein (vein stripping). ? Cutting off blood flow through the vein (laser ablation surgery). ? Repairing a valve.  Follow these instructions at home:  Wear compression stockings as told by your health care provider. These stockings help to prevent blood clots and reduce swelling in your legs.  Take over-the-counter and prescription medicines only as told by your health care provider.  Stay active by exercising, walking, or doing different activities. Ask your health care provider what activities are safe for you and how much exercise you need.  Drink enough fluid to keep your urine clear or pale yellow.  Do not use any products that contain nicotine or tobacco, such as cigarettes and e-cigarettes. If you need help quitting, ask your health care provider.  Keep all follow-up visits as told by your health care provider. This is important. Contact a  health care provider if:  You have redness, swelling, or more pain in the affected area.  You see a red streak or line that extends up or down from the affected area.  You have skin breakdown or a loss of skin in the affected area, even if the breakdown is small.  You get an injury in the  affected area. Get help right away if:  You get an injury and an open wound in the affected area.  You have severe pain that does not get better with medicine.  You have sudden numbness or weakness in the foot or ankle below the affected area, or you have trouble moving your foot or ankle.  You have a fever and you have worse or persistent symptoms.  You have chest pain.  You have shortness of breath. Summary  Chronic venous insufficiency, also called venous stasis, is a condition that prevents blood from being pumped effectively through the veins in your legs.  Chronic venous insufficiency occurs when the vein walls become stretched, weakened, or damaged, or when valves within the vein are damaged.  Treatment for this condition depends on how severe your condition is, and it may involve wearing compression stockings or having a procedure.  Make sure you stay active by exercising, walking, or doing different activities. Ask your health care provider what activities are safe for you and how much exercise you need. This information is not intended to replace advice given to you by your health care provider. Make sure you discuss any questions you have with your health care provider. Document Released: 07/18/2006 Document Revised: 02/01/2016 Document Reviewed: 02/01/2016 Elsevier Interactive Patient Education  2017 [employer].

## 2017-09-08 NOTE — Progress Notes (Signed)
Patient: Diamond Lucas Female    DOB: June 16, 1960   57 y.o.   MRN: [id number] Visit Date: 09/08/2017  Today's Provider: Mar Daring, PA-C   Chief Complaint  Patient presents with  . Leg Problem   Subjective:    HPI Patient here today C/O varicose veins on both legs x's several years. Patient reports her mother had blood clots and patient reports she just wants to have her legs checked. Patient denies any pain, patient reports some muscle spasms. She reports seeing a vein clinic in the past and underwent sclerotherapy but reports not wanting to go through that again. She denies any swelling, pain or discoloration.    Allergies  Allergen Reactions  . Diclofenac Sodium Palpitations  . Naproxen Itching, Rash and Hives  . Olanzapine     Other reaction(s): Other Cross-eyed and hyperventilation  . Dust Mite Mixed Allergen Ext [Mite (D. Farinae)] Itching    Respiratory illness, and itchy eyes, eye problems  . Gluten Meal Other (See Comments)    Abdominal bloating  . Nsaids     Other reaction(s): Other (See Comments) Aleve causes hives  . Other Itching    Respiratory illness, and itchy eyes, eye problems Respiratory illness, and itchy eyes, eye problems     Current Outpatient Medications:  .  Calcium Carbonate-Vit D-Min (CALCIUM 600+D PLUS MINERALS) 600-400 MG-UNIT TABS, Take by mouth daily. , Disp: , Rfl:  .  Carboxymeth-Glyc-Polysorb PF (REFRESH OPTIVE MEGA-3) 0.5-1-0.5 % SOLN, Apply 2 drops to eye 2 (two) times daily., Disp: , Rfl:  .  ibuprofen (ADVIL,MOTRIN) 600 MG tablet, Take 1 tablet (600 mg total) by mouth every 8 (eight) hours as needed., Disp: 30 tablet, Rfl: 0 .  levothyroxine (SYNTHROID, LEVOTHROID) 75 MCG tablet, TAKE 1 TABLET BY MOUTH DAILY, Disp: , Rfl:  .  Multiple Vitamin (MULTI-VITAMINS) TABS, daily. , Disp: , Rfl:  .  Omega-3 Fatty Acids (FISH OIL) 1000 MG CAPS, Take by mouth daily., Disp: , Rfl:  .  risperiDONE (RISPERDAL) 2 MG tablet, Take 1  tablet (2 mg total) by mouth at bedtime., Disp: 30 tablet, Rfl: 0 .  triamcinolone (NASACORT ALLERGY 24HR) 55 MCG/ACT AERO nasal inhaler, Place 2 sprays into the nose daily., Disp: , Rfl:  .  triamcinolone cream (KENALOG) 0.1 %, Apply 1 application topically 2 (two) times daily., Disp: 80 g, Rfl: 0 .  meclizine (ANTIVERT) 12.5 MG tablet, Take 1 tablet (12.5 mg total) by mouth 3 (three) times daily as needed for dizziness. (Patient not taking: Reported on 09/08/2017), Disp: 30 tablet, Rfl: 3  Review of Systems  Constitutional: Negative.   Respiratory: Negative.   Cardiovascular: Negative.   Musculoskeletal: Positive for myalgias.  Skin: Negative.     Social History   Tobacco Use  . Smoking status: Never Smoker  . Smokeless tobacco: Never Used  Substance Use Topics  . Alcohol use: No   Objective:   BP 120/78 (BP Location: Left Arm, Patient Position: Sitting, Cuff Size: Normal)   Pulse 78   Temp 97.9 F (36.6 C) (Oral)   Resp 16   Ht 5\' 1"  (1.549 m)   Wt 120 lb (54.4 kg)   LMP 04/10/2015 Comment: Only spotting  SpO2 97%   BMI 22.67 kg/m  Vitals:   09/08/17 1319  BP: 120/78  Pulse: 78  Resp: 16  Temp: 97.9 F (36.6 C)  TempSrc: Oral  SpO2: 97%  Weight: 120 lb (54.4 kg)  Height: 5\' 1"  (1.549  m)     Physical Exam  Constitutional: She appears well-developed and well-nourished. No distress.  Neck: Normal range of motion. Neck supple.  Cardiovascular: Normal rate, regular rhythm, normal heart sounds and intact distal pulses. Exam reveals no gallop and no friction rub.  No murmur heard. Pulmonary/Chest: Effort normal and breath sounds normal. No respiratory distress. She has no wheezes. She has no rales.  Musculoskeletal: She exhibits no edema.  Skin: Skin is warm and dry. Capillary refill takes less than 2 seconds. She is not diaphoretic. No erythema.  There are multiple, small reticular veins and spider veins bilaterally. Right leg has a small, ropey varicosity running  over the patella and a larger, ropey varicosity that runs anterior just along tibia.   Vitals reviewed.      Assessment & Plan:     1. Venous insufficiency of both lower extremities Discussed conservative management with compression stockings and elevating her legs when at rest. She voices understanding. Also was shown pictures of what having a DVT will look like to ease concerns of a clot. Advised about [facility] if symptoms worsen. She states she may call them in the fall if no improvements.        Mar Daring, PA-C  [facility]

## 2017-09-08 NOTE — Addendum Note (Signed)
Addended by: Mar Daring on: 09/08/2017 01:52 PM   Modules accepted: Level of Service

## 2017-09-11 ENCOUNTER — Encounter: Payer: Self-pay | Admitting: Physician Assistant

## 2017-09-11 NOTE — Addendum Note (Signed)
Addended by: Mar Daring on: 09/11/2017 08:52 AM   Modules accepted: Orders

## 2017-09-19 DIAGNOSIS — F4323 Adjustment disorder with mixed anxiety and depressed mood: Secondary | ICD-10-CM | POA: Diagnosis not present

## 2017-10-02 ENCOUNTER — Encounter (INDEPENDENT_AMBULATORY_CARE_PROVIDER_SITE_OTHER): Payer: Self-pay | Admitting: Vascular Surgery

## 2017-10-02 ENCOUNTER — Ambulatory Visit (INDEPENDENT_AMBULATORY_CARE_PROVIDER_SITE_OTHER): Payer: Medicare Other | Admitting: Vascular Surgery

## 2017-10-02 VITALS — BP 118/76 | HR 73 | Resp 17 | Ht 61.0 in | Wt 119.0 lb

## 2017-10-02 DIAGNOSIS — E039 Hypothyroidism, unspecified: Secondary | ICD-10-CM | POA: Diagnosis not present

## 2017-10-02 DIAGNOSIS — I872 Venous insufficiency (chronic) (peripheral): Secondary | ICD-10-CM | POA: Diagnosis not present

## 2017-10-02 DIAGNOSIS — Z79899 Other long term (current) drug therapy: Secondary | ICD-10-CM | POA: Diagnosis not present

## 2017-10-02 DIAGNOSIS — I890 Lymphedema, not elsewhere classified: Secondary | ICD-10-CM

## 2017-10-02 DIAGNOSIS — I10 Essential (primary) hypertension: Secondary | ICD-10-CM

## 2017-10-02 DIAGNOSIS — F259 Schizoaffective disorder, unspecified: Secondary | ICD-10-CM | POA: Diagnosis not present

## 2017-10-07 ENCOUNTER — Encounter (INDEPENDENT_AMBULATORY_CARE_PROVIDER_SITE_OTHER): Payer: Self-pay | Admitting: Vascular Surgery

## 2017-10-07 DIAGNOSIS — I872 Venous insufficiency (chronic) (peripheral): Secondary | ICD-10-CM | POA: Insufficient documentation

## 2017-10-07 DIAGNOSIS — I890 Lymphedema, not elsewhere classified: Secondary | ICD-10-CM | POA: Insufficient documentation

## 2017-10-07 NOTE — Progress Notes (Signed)
MRN : [id number]  Diamond Lucas is a 57 y.o. (1960/07/22) female who presents with chief complaint of  Chief Complaint  Patient presents with  . New Patient (Initial Visit)    Venous insufficiency  .  History of Present Illness:   Patient is seen for evaluation of leg pain and leg swelling. The patient first noticed the swelling remotely. The swelling is associated with pain and discoloration. The pain and swelling worsens with prolonged dependency and improves with elevation. The pain is unrelated to activity.  The patient notes that in the morning the legs are significantly improved but they steadily worsened throughout the course of the day. The patient also notes a steady worsening of the discoloration in the ankle and shin area.   The patient denies claudication symptoms.  The patient denies symptoms consistent with rest pain.  The patient denies and extensive history of DJD and LS spine disease.  The patient has no had any past angiography, interventions or vascular surgery.  Elevation makes the leg symptoms better, dependency makes them much worse. There is no history of ulcerations. The patient denies any recent changes in medications.  The patient has not been wearing graduated compression.  The patient denies a history of DVT or PE. There is no prior history of phlebitis. There is no history of primary lymphedema.  No history of malignancies. No history of trauma or groin or pelvic surgery. There is no history of radiation treatment to the groin or pelvis  The patient denies amaurosis fugax or recent TIA symptoms. There are no recent neurological changes noted. The patient denies recent episodes of angina or shortness of breath  Current Meds  Medication Sig  . Calcium Carbonate-Vit D-Min (CALCIUM 600+D PLUS MINERALS) 600-400 MG-UNIT TABS Take by mouth daily.   . Carboxymeth-Glyc-Polysorb PF (REFRESH OPTIVE MEGA-3) 0.5-1-0.5 % SOLN Apply 2 drops to eye 2 (two) times  daily.  Marland Kitchen ibuprofen (ADVIL,MOTRIN) 600 MG tablet Take 1 tablet (600 mg total) by mouth every 8 (eight) hours as needed.  Marland Kitchen levothyroxine (SYNTHROID, LEVOTHROID) 75 MCG tablet TAKE 1 TABLET BY MOUTH DAILY  . Multiple Vitamin (MULTI-VITAMINS) TABS daily.   . Omega-3 Fatty Acids (FISH OIL) 1000 MG CAPS Take by mouth daily.  . risperiDONE (RISPERDAL) 2 MG tablet Take 1 tablet (2 mg total) by mouth at bedtime.  . triamcinolone (NASACORT ALLERGY 24HR) 55 MCG/ACT AERO nasal inhaler Place 2 sprays into the nose daily.  Marland Kitchen triamcinolone cream (KENALOG) 0.1 % Apply 1 application topically 2 (two) times daily.    Past Medical History:  Diagnosis Date  . Allergy   . Arrhythmia   . History of chicken pox   . History of measles   . History of syncope   . Osteoarthritis   . Rosacea   . Schizophrenia ([place])   . Thyroid disease     Past Surgical History:  Procedure Laterality Date  . FOOT SURGERY Right   . THYROIDECTOMY      Social History Social History   Tobacco Use  . Smoking status: Never Smoker  . Smokeless tobacco: Never Used  Substance Use Topics  . Alcohol use: No  . Drug use: No    Family History Family History  Problem Relation Age of Onset  . Depression Mother   . Cataracts Mother   . Stroke Mother        mini  . Cancer Other   . Heart failure Other   . Heart disease Other   .  Stroke Other   . Diabetes Other   . Kidney cancer Father   . Anxiety disorder Brother   No family history of bleeding/clotting disorders, porphyria or autoimmune disease   Allergies  Allergen Reactions  . Diclofenac Sodium Palpitations  . Naproxen Itching, Rash and Hives  . Olanzapine     Other reaction(s): Other Cross-eyed and hyperventilation  . Dust Mite Mixed Allergen Ext [Mite (D. Farinae)] Itching    Respiratory illness, and itchy eyes, eye problems  . Gluten Meal Other (See Comments)    Abdominal bloating  . Nsaids     Other reaction(s): Other (See Comments) Aleve causes hives   . Other Itching    Respiratory illness, and itchy eyes, eye problems Respiratory illness, and itchy eyes, eye problems     REVIEW OF SYSTEMS (Negative unless checked)  Constitutional: [] Weight loss  [] Fever  [] Chills Cardiac: [] Chest pain   [] Chest pressure   [] Palpitations   [] Shortness of breath when laying flat   [] Shortness of breath with exertion. Vascular:  [] Pain in legs with walking   [x] Pain in legs at rest  [] History of DVT   [] Phlebitis   [x] Swelling in legs   [] Varicose veins   [] Non-healing ulcers Pulmonary:   [] Uses home oxygen   [] Productive cough   [] Hemoptysis   [] Wheeze  [] COPD   [] Asthma Neurologic:  [] Dizziness   [] Seizures   [] History of stroke   [] History of TIA  [] Aphasia   [] Vissual changes   [] Weakness or numbness in arm   [] Weakness or numbness in leg Musculoskeletal:   [] Joint swelling   [] Joint pain   [] Low back pain Hematologic:  [] Easy bruising  [] Easy bleeding   [] Hypercoagulable state   [] Anemic Gastrointestinal:  [] Diarrhea   [] Vomiting  [] Gastroesophageal reflux/heartburn   [] Difficulty swallowing. Genitourinary:  [] Chronic kidney disease   [] Difficult urination  [] Frequent urination   [] Blood in urine Skin:  [] Rashes   [] Ulcers  Psychological:  [] History of anxiety   [x]  History of major depression.  Physical Examination  Vitals:   10/02/17 1138  BP: 118/76  Pulse: 73  Resp: 17  Weight: 119 lb (54 kg)  Height: 5\' 1"  (1.549 m)   Body mass index is 22.48 kg/m. Gen: WD/WN, NAD Head: [place]/AT, No temporalis wasting.  Ear/Nose/Throat: Hearing grossly intact, nares w/o erythema or drainage, poor dentition Eyes: PER, EOMI, sclera nonicteric.  Neck: Supple, no masses.  No bruit or JVD.  Pulmonary:  Good air movement, clear to auscultation bilaterally, no use of accessory muscles.  Cardiac: RRR, normal S1, S2, no Murmurs. Vascular: scattered varicosities present bilaterally.  Moderate to severe venous stasis changes to the legs bilaterally.  3-4+ soft  pitting edema Vessel Right Left  Radial Palpable Palpable  PT Palpable Palpable  DP Palpable Palpable   Gastrointestinal: soft, non-distended. No guarding/no peritoneal signs.  Musculoskeletal: M/S 5/5 throughout.  No deformity or atrophy.  Neurologic: CN 2-12 intact. Pain and light touch intact in extremities.  Symmetrical.  Speech is fluent. Motor exam as listed above. Psychiatric: Judgment intact, Mood & affect appropriate for pt's clinical situation. Dermatologic: Venous rashes no ulcers noted.  No changes consistent with cellulitis. Lymph : No Cervical lymphadenopathy, no lichenification or skin changes of chronic lymphedema.  CBC Lab Results  Component Value Date   WBC 7.6 07/12/2017   HGB 14.0 07/12/2017   HCT 42.2 07/12/2017   MCV 94 07/12/2017   PLT 231 07/12/2017    BMET    Component Value Date/Time   NA  142 07/12/2017 1607   K 4.1 07/12/2017 1607   CL 100 07/12/2017 1607   CO2 25 07/12/2017 1607   GLUCOSE 84 07/12/2017 1607   GLUCOSE 100 (H) 02/16/2016 1820   BUN 13 07/12/2017 1607   CREATININE 0.96 07/12/2017 1607   CALCIUM 9.2 07/12/2017 1607   GFRNONAA 67 07/12/2017 1607   GFRAA 78 07/12/2017 1607   CrCl cannot be calculated (Patient's most recent lab result is older than the maximum 21 days allowed.).  COAG No results found for: INR, PROTIME  Radiology No results found.   Assessment/Plan 1. Chronic venous insufficiency No surgery or intervention at this point in time.    I have had a long discussion with the patient regarding venous insufficiency and why it  causes symptoms. I have discussed with the patient the chronic skin changes that accompany venous insufficiency and the long term sequela such as infection and ulceration.  Patient will begin wearing graduated compression stockings class 1 (20-30 mmHg) or compression wraps on a daily basis a prescription was given. The patient will put the stockings on first thing in the morning and removing them  in the evening. The patient is instructed specifically not to sleep in the stockings.    In addition, behavioral modification including several periods of elevation of the lower extremities during the day will be continued. I have demonstrated that proper elevation is a position with the ankles at heart level.  The patient is instructed to begin routine exercise, especially walking on a daily basis  Patient should undergo duplex ultrasound of the venous system to ensure that DVT or reflux is not present.  Following the review of the ultrasound the patient will follow up in 2-3 months to reassess the degree of swelling and the control that graduated compression stockings or compression wraps  is offering.   The patient can be assessed for a Lymph Pump at that time  - VAS [place] LOWER EXTREMITY VENOUS REFLUX; Future  2. Lymphedema No surgery or intervention at this point in time.    I have had a long discussion with the patient regarding venous insufficiency and why it  causes symptoms. I have discussed with the patient the chronic skin changes that accompany venous insufficiency and the long term sequela such as infection and ulceration.  Patient will begin wearing graduated compression stockings class 1 (20-30 mmHg) or compression wraps on a daily basis a prescription was given. The patient will put the stockings on first thing in the morning and removing them in the evening. The patient is instructed specifically not to sleep in the stockings.    In addition, behavioral modification including several periods of elevation of the lower extremities during the day will be continued. I have demonstrated that proper elevation is a position with the ankles at heart level.  The patient is instructed to begin routine exercise, especially walking on a daily basis  Patient should undergo duplex ultrasound of the venous system to ensure that DVT or reflux is not present.  Following the review of the  ultrasound the patient will follow up in 2-3 months to reassess the degree of swelling and the control that graduated compression stockings or compression wraps  is offering.   The patient can be assessed for a Lymph Pump at that time  3. Essential hypertension Continue antihypertensive medications as already ordered, these medications have been reviewed and there are no changes at this time.   4. Hypothyroidism, unspecified type Continue Synthroid as already  ordered, these medications have been reviewed and there are no changes at this time.     Hortencia Pilar, MD  10/07/2017 12:48 PM

## 2017-10-08 ENCOUNTER — Encounter: Payer: Self-pay | Admitting: Physician Assistant

## 2017-10-17 DIAGNOSIS — F4323 Adjustment disorder with mixed anxiety and depressed mood: Secondary | ICD-10-CM | POA: Diagnosis not present

## 2017-10-27 DIAGNOSIS — M779 Enthesopathy, unspecified: Secondary | ICD-10-CM | POA: Diagnosis not present

## 2017-10-27 DIAGNOSIS — M79672 Pain in left foot: Secondary | ICD-10-CM | POA: Diagnosis not present

## 2017-10-31 ENCOUNTER — Encounter: Payer: Self-pay | Admitting: Obstetrics and Gynecology

## 2017-10-31 ENCOUNTER — Inpatient Hospital Stay
Admission: AD | Admit: 2017-10-31 | Discharge: 2017-11-02 | DRG: 885 | Disposition: A | Discharge status: Home / Self Care | Payer: Medicare Other | Source: Intra-hospital | Attending: Psychiatry | Admitting: Psychiatry

## 2017-10-31 ENCOUNTER — Telehealth: Payer: Self-pay

## 2017-10-31 ENCOUNTER — Emergency Department
Admission: EM | Admit: 2017-10-31 | Discharge: 2017-10-31 | Disposition: A | Discharge status: Other Acute Inpatient Hospital | Payer: Medicare Other | Attending: Emergency Medicine | Admitting: Emergency Medicine

## 2017-10-31 ENCOUNTER — Encounter: Payer: Self-pay | Admitting: Emergency Medicine

## 2017-10-31 DIAGNOSIS — Z79890 Hormone replacement therapy (postmenopausal): Secondary | ICD-10-CM | POA: Diagnosis not present

## 2017-10-31 DIAGNOSIS — Z823 Family history of stroke: Secondary | ICD-10-CM

## 2017-10-31 DIAGNOSIS — I10 Essential (primary) hypertension: Secondary | ICD-10-CM | POA: Insufficient documentation

## 2017-10-31 DIAGNOSIS — Z886 Allergy status to analgesic agent status: Secondary | ICD-10-CM | POA: Diagnosis not present

## 2017-10-31 DIAGNOSIS — E890 Postprocedural hypothyroidism: Secondary | ICD-10-CM | POA: Diagnosis present

## 2017-10-31 DIAGNOSIS — N643 Galactorrhea not associated with childbirth: Secondary | ICD-10-CM | POA: Diagnosis present

## 2017-10-31 DIAGNOSIS — Z8051 Family history of malignant neoplasm of kidney: Secondary | ICD-10-CM | POA: Diagnosis not present

## 2017-10-31 DIAGNOSIS — F22 Delusional disorders: Secondary | ICD-10-CM | POA: Diagnosis present

## 2017-10-31 DIAGNOSIS — E039 Hypothyroidism, unspecified: Secondary | ICD-10-CM | POA: Diagnosis present

## 2017-10-31 DIAGNOSIS — Z79899 Other long term (current) drug therapy: Secondary | ICD-10-CM | POA: Insufficient documentation

## 2017-10-31 DIAGNOSIS — Z818 Family history of other mental and behavioral disorders: Secondary | ICD-10-CM

## 2017-10-31 DIAGNOSIS — F209 Schizophrenia, unspecified: Secondary | ICD-10-CM | POA: Insufficient documentation

## 2017-10-31 DIAGNOSIS — R109 Unspecified abdominal pain: Secondary | ICD-10-CM | POA: Diagnosis not present

## 2017-10-31 DIAGNOSIS — Z3200 Encounter for pregnancy test, result unknown: Secondary | ICD-10-CM | POA: Insufficient documentation

## 2017-10-31 LAB — CBC WITH DIFFERENTIAL/PLATELET
BASOS PCT: 1 %
Basophils Absolute: 0 10*3/uL (ref 0–0.1)
EOS ABS: 0.1 10*3/uL (ref 0–0.7)
Eosinophils Relative: 1 %
HEMATOCRIT: 42.8 % (ref 35.0–47.0)
HEMOGLOBIN: 14.8 g/dL (ref 12.0–16.0)
Lymphocytes Relative: 26 %
Lymphs Abs: 2.1 10*3/uL (ref 1.0–3.6)
MCH: 31.8 pg (ref 26.0–34.0)
MCHC: 34.6 g/dL (ref 32.0–36.0)
MCV: 91.8 fL (ref 80.0–100.0)
MONO ABS: 0.6 10*3/uL (ref 0.2–0.9)
MONOS PCT: 8 %
Neutro Abs: 5.2 10*3/uL (ref 1.4–6.5)
Neutrophils Relative %: 64 %
PLATELETS: 236 10*3/uL (ref 150–440)
RBC: 4.66 MIL/uL (ref 3.80–5.20)
RDW: 13 % (ref 11.5–14.5)
WBC: 8.1 10*3/uL (ref 3.6–11.0)

## 2017-10-31 LAB — URINE DRUG SCREEN, QUALITATIVE (ARMC ONLY)
AMPHETAMINES, UR SCREEN: NOT DETECTED
Barbiturates, Ur Screen: NOT DETECTED
COCAINE METABOLITE, UR ~~LOC~~: NOT DETECTED
Cannabinoid 50 Ng, Ur ~~LOC~~: NOT DETECTED
MDMA (ECSTASY) UR SCREEN: NOT DETECTED
METHADONE SCREEN, URINE: NOT DETECTED
OPIATE, UR SCREEN: NOT DETECTED
PHENCYCLIDINE (PCP) UR S: NOT DETECTED
Tricyclic, Ur Screen: NOT DETECTED

## 2017-10-31 LAB — COMPREHENSIVE METABOLIC PANEL
ALBUMIN: 4.4 g/dL (ref 3.5–5.0)
ALK PHOS: 84 U/L (ref 38–126)
ALT: 18 U/L (ref 0–44)
ANION GAP: 8 (ref 5–15)
AST: 26 U/L (ref 15–41)
BILIRUBIN TOTAL: 0.4 mg/dL (ref 0.3–1.2)
BUN: 16 mg/dL (ref 6–20)
CALCIUM: 9 mg/dL (ref 8.9–10.3)
CO2: 30 mmol/L (ref 22–32)
Chloride: 102 mmol/L (ref 98–111)
Creatinine, Ser: 0.89 mg/dL (ref 0.44–1.00)
GFR calc Af Amer: >60 mL/min (ref >60)
GFR calc non Af Amer: >60 mL/min (ref >60)
GLUCOSE: 104 mg/dL — AB (ref 70–99)
Potassium: 3.8 mmol/L (ref 3.5–5.1)
SODIUM: 140 mmol/L (ref 135–145)
TOTAL PROTEIN: 7.3 g/dL (ref 6.5–8.1)

## 2017-10-31 LAB — ETHANOL: Alcohol, Ethyl (B): <10 mg/dL (ref <10)

## 2017-10-31 LAB — ACETAMINOPHEN LEVEL: Acetaminophen (Tylenol), Serum: <10 ug/mL — ABNORMAL LOW (ref 10–30)

## 2017-10-31 LAB — HCG, QUANTITATIVE, PREGNANCY: HCG, BETA CHAIN, QUANT, S: 5 m[IU]/mL — AB (ref <5)

## 2017-10-31 LAB — SALICYLATE LEVEL: Salicylate Lvl: <7 mg/dL (ref 2.8–30.0)

## 2017-10-31 NOTE — BH Assessment (Signed)
Assessment Note  Diamond Lucas is an 57 y.o. female who presents to the ER because she's pregnant and one of the fetuses told her something to bit her and she need to get the unborn child "checked out." Patient have a history of schizophrenia and currently believes she's pregnant.  Patient states because she is post-menopausal medical equipment is unable to detect the unborn children. She further reports, she's been pregnant for approximately a year and she's pregnant with multiple children.  During the interview, the patient was calm, cooperative and pleasant. She was able to provide appropriate answers to majority of the questions. When asked for contact information for her love one and support system, she stated she didn't want anyone contacted. It's unclear if patient is currently safe and able to care for herself. She states she lives alone and does everything on for herself. When asked about natural supports, she became guarded and provided limited information when answering. She denies the use of mind-altering substance and history of violence.   Diagnosis: Schizophrenia   Past Medical History:  Past Medical History:  Diagnosis Date  . Allergy   . Arrhythmia   . History of chicken pox   . History of measles   . History of syncope   . Osteoarthritis   . Rosacea   . Schizophrenia ([place])   . Thyroid disease     Past Surgical History:  Procedure Laterality Date  . FOOT SURGERY Right   . THYROIDECTOMY      Family History:  Family History  Problem Relation Age of Onset  . Depression Mother   . Cataracts Mother   . Stroke Mother        mini  . Cancer Other   . Heart failure Other   . Heart disease Other   . Stroke Other   . Diabetes Other   . Kidney cancer Father   . Anxiety disorder Brother     Social History:  reports that she has never smoked. She has never used smokeless tobacco. She reports that she does not drink alcohol or use drugs.  Additional Social History:   Alcohol / Drug Use Pain Medications: See PTA Prescriptions: See PTA Over the Counter: See PTA History of alcohol / drug use?: No history of alcohol / drug abuse Longest period of sobriety (when/how long): n/a Negative Consequences of Use: (n/a) Withdrawal Symptoms: (n/a)  CIWA: CIWA-Ar BP: (!) 145/79 Pulse Rate: 72 COWS:    Allergies:  Allergies  Allergen Reactions  . Diclofenac Sodium Palpitations  . Naproxen Itching, Rash and Hives  . Olanzapine     Other reaction(s): Other Cross-eyed and hyperventilation  . Dust Mite Mixed Allergen Ext [Mite (D. Farinae)] Itching    Respiratory illness, and itchy eyes, eye problems  . Gluten Meal Other (See Comments)    Abdominal bloating  . Nsaids     Other reaction(s): Other (See Comments) Aleve causes hives  . Other Itching    Respiratory illness, and itchy eyes, eye problems Respiratory illness, and itchy eyes, eye problems    Home Medications:  (Not in a hospital admission)  OB/GYN Status:  Patient's last menstrual period was 04/10/2015.  General Assessment Data [facility] TTS Assessment: In system Is this a Tele or Face-to-Face Assessment?: Face-to-Face Is this an Initial Assessment or a Re-assessment for this encounter?: Initial Assessment Marital status: Single Is patient pregnant?: No Pregnancy Status: No Living Arrangements: Alone Can pt return to current living arrangement?: Yes Admission  Status: Involuntary Is patient capable of signing voluntary admission?: No(Under IVC) Referral Source: Self/Family/Friend Insurance type: Medicare  Medical Screening Exam ([place]) Medical Exam completed: Yes  Crisis Care Plan Living Arrangements: Alone Legal Guardian: Other:(Self) Name of Psychiatrist: [place] [facility] Name of Therapist: [name]  Education Status Is patient currently in school?: No Is the patient employed, unemployed or receiving disability?: Unemployed  Risk  to self with the past 6 months Suicidal Ideation: No Has patient been a risk to self within the past 6 months prior to admission? : No Suicidal Intent: No Has patient had any suicidal intent within the past 6 months prior to admission? : No Is patient at risk for suicide?: No Suicidal Plan?: No Has patient had any suicidal plan within the past 6 months prior to admission? : No Access to Means: No What has been your use of drugs/alcohol within the last 12 months?: Reports of none Previous Attempts/Gestures: No How many times?: 0 Other Self Harm Risks: Reports of none Triggers for Past Attempts: None known Intentional Self Injurious Behavior: None Family Suicide History: No Recent stressful life event(s): Other (Comment) Persecutory voices/beliefs?: No Depression: No Substance abuse history and/or treatment for substance abuse?: No Suicide prevention information given to non-admitted patients: Not applicable  Risk to Others within the past 6 months Homicidal Ideation: No Does patient have any lifetime risk of violence toward others beyond the six months prior to admission? : No Thoughts of Harm to Others: No Current Homicidal Intent: No Current Homicidal Plan: No Access to Homicidal Means: No Identified Victim: Reports of none History of harm to others?: No Assessment of Violence: None Noted Violent Behavior Description: Reports of none Does patient have access to weapons?: No Criminal Charges Pending?: No Does patient have a court date: No Is patient on probation?: No  Psychosis Hallucinations: None noted Delusions: Somatic  Mental Status Report Appearance/Hygiene: Unremarkable, In scrubs Eye Contact: Fair Motor Activity: Freedom of movement, Unremarkable Speech: Logical/coherent Level of Consciousness: Alert Mood: Sad, Pleasant Affect: Appropriate to circumstance, Anxious Anxiety Level: Minimal Thought Processes: Coherent, Relevant Judgement: Impaired Orientation:  Person, Place, Time, Appropriate for developmental age Obsessive Compulsive Thoughts/Behaviors: Severe  Cognitive Functioning Concentration: Normal Memory: Recent Intact, Remote Intact Is patient IDD: No Is patient DD?: No Insight: Poor Impulse Control: Fair Appetite: Good Have you had any weight changes? : No Change Sleep: No Change Total Hours of Sleep: 8 Vegetative Symptoms: None  ADLScreening [facility] Assessment Services) Patient's cognitive ability adequate to safely complete daily activities?: Yes Patient able to express need for assistance with ADLs?: Yes Independently performs ADLs?: Yes (appropriate for developmental age)  Prior Inpatient Therapy Prior Inpatient Therapy: Yes Prior Therapy Dates: 01/2016, 09/2015 & 04/2014 Prior Therapy Facilty/Provider(s): [facility] Reason for Treatment: Schizophrenia   Prior Outpatient Therapy Prior Outpatient Therapy: Yes Prior Therapy Dates: Current Prior Therapy Facilty/Provider(s): [facility] Reason for Treatment: Schizophrenia  Does patient have an ACCT team?: Yes Does patient have Intensive In-House Services?  : No Does patient have [facility] services? : No Does patient have P4CC services?: No  ADL Screening (condition at time of admission) Patient's cognitive ability adequate to safely complete daily activities?: Yes Is the patient deaf or have difficulty hearing?: No Does the patient have difficulty seeing, even when wearing glasses/contacts?: No Does the patient have difficulty concentrating, remembering, or making decisions?: No Patient able to express need for assistance with ADLs?: Yes Does the patient have difficulty dressing or bathing?: No Independently performs  ADLs?: Yes (appropriate for developmental age) Does the patient have difficulty walking or climbing stairs?: No Weakness of Legs: None Weakness of Arms/Hands: None  Home Assistive Devices/Equipment Home Assistive Devices/Equipment: None  Therapy  Consults (therapy consults require a physician order) PT Evaluation Needed: No OT Evalulation Needed: No SLP Evaluation Needed: No Abuse/Neglect Assessment (Assessment to be complete while patient is alone) Abuse/Neglect Assessment Can Be Completed: Yes Physical Abuse: Denies Verbal Abuse: Denies Sexual Abuse: Denies Exploitation of patient/patient's resources: Denies Self-Neglect: Denies Values / Beliefs Cultural Requests During Hospitalization: None Spiritual Requests During Hospitalization: None Consults Spiritual Care Consult Needed: No Social Work Consult Needed: No         Child/Adolescent Assessment Running Away Risk: Denies(Patient is an adult)  Disposition:  Disposition Initial Assessment Completed for this Encounter: Yes  On Site Evaluation by:   Reviewed with Physician:    Gunnar Fusi MS, LCAS, [place], [place], CCSI Therapeutic Triage Specialist 10/31/2017 7:37 PM

## 2017-10-31 NOTE — ED Notes (Signed)
Hourly rounding reveals patient sleeping in room. No complaints, stable, in no acute distress. Q15 minute rounds and monitoring via Security Cameras to continue. 

## 2017-10-31 NOTE — ED Notes (Signed)
Hourly rounding reveals patient in room. No complaints, stable, in no acute distress. Q15 minute rounds and monitoring via Security Cameras to continue. 

## 2017-10-31 NOTE — ED Notes (Signed)

## 2017-10-31 NOTE — ED Notes (Signed)
[facility] PENDING  CONSULT ?

## 2017-10-31 NOTE — ED Provider Notes (Signed)
[facility] Emergency Department Provider Note  ____________________________________________   First MD Initiated Contact with Patient 10/31/17 1349     (approximate)  I have reviewed the triage vital signs and the nursing notes.   HISTORY  Chief Complaint Paranoid and Possible Pregnancy   HPI Diamond Lucas is a 57 y.o. female self presents to the emergency department requesting help with her pregnancy.  She says that she is postmenopausal and last had sexual intercourse 10 years ago however she knows that she is pregnant with twins.  She is worried because there are bats living in the crawlspace of her house and she believes 1 of the bats is gotten inside her wound and is actively biting 1 of her fetuses.  She does report a long-standing history of "bipolar disorder" and says that she takes Risperdal 1 mg at night.  She reports compliance with her medications.  She said her symptoms began suddenly are severe or constant nothing seems to make them better or worse.    Past Medical History:  Diagnosis Date  . Allergy   . Arrhythmia   . History of chicken pox   . History of measles   . History of syncope   . Osteoarthritis   . Rosacea   . Schizophrenia ([place])   . Thyroid disease     Patient Active Problem List   Diagnosis Date Noted  . Chronic venous insufficiency 10/07/2017  . Lymphedema 10/07/2017  . 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 Postoperative hypothyroidism 05/17/2016  . Bipolar I disorder, most recent episode manic, severe with psychotic features ([place]) 02/17/2016  . Delusional disorder, somatic type ([place]) 10/07/2015  . HTN (hypertension) 10/07/2015  . Noncompliance 10/06/2015  . Rosacea 02/09/2015  . Rhinitis, allergic 12/26/2014  . 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 Reflux 09/26/2012  . Hypothyroidism, unspecified 03/13/2012  . Hirsutism 04/11/2011    Past Surgical  History:  Procedure Laterality Date  . FOOT SURGERY Right   . THYROIDECTOMY      Prior to Admission medications   Medication Sig Start Date End Date Taking? Authorizing Provider  Calcium Carbonate-Vit D-Min (CALCIUM 600+D PLUS MINERALS) 600-400 MG-UNIT TABS Take by mouth daily.     [provider]  Carboxymeth-Glyc-Polysorb PF (REFRESH OPTIVE MEGA-3) 0.5-1-0.5 % SOLN Apply 2 drops to eye 2 (two) times daily.    [provider]  ibuprofen (ADVIL,MOTRIN) 600 MG tablet Take 1 tablet (600 mg total) by mouth every 8 (eight) hours as needed. 12/31/15   Mar Daring, PA-C  levothyroxine (SYNTHROID, LEVOTHROID) 75 MCG tablet TAKE 1 TABLET BY MOUTH DAILY 02/15/16   [provider]  meclizine (ANTIVERT) 12.5 MG tablet Take 1 tablet (12.5 mg total) by mouth 3 (three) times daily as needed for dizziness. Patient not taking: Reported on 09/08/2017 01/18/17   Mar Daring, PA-C  Multiple Vitamin (MULTI-VITAMINS) TABS daily.     [provider]  Omega-3 Fatty Acids (FISH OIL) 1000 MG CAPS Take by mouth daily.    [provider]  risperiDONE (RISPERDAL) 2 MG tablet Take 1 tablet (2 mg total) by mouth at bedtime. 04/13/17   Mar Daring, PA-C  triamcinolone (NASACORT ALLERGY 24HR) 55 MCG/ACT AERO nasal inhaler Place 2 sprays into the nose daily.    [provider]  triamcinolone cream (KENALOG) 0.1 % Apply 1 application topically 2 (two) times daily. 05/18/17   Mar Daring, PA-C    Allergies Diclofenac sodium; Naproxen;  Olanzapine; Dust mite mixed allergen ext [mite (d. farinae)]; Gluten meal; Nsaids; and Other  Family History  Problem Relation Age of Onset  . Depression Mother   . Cataracts Mother   . Stroke Mother        mini  . Cancer Other   . Heart failure Other   . Heart disease Other   . Stroke Other   . Diabetes Other   . Kidney cancer Father   . Anxiety disorder Brother     Social History Social History    Tobacco Use  . Smoking status: Never Smoker  . Smokeless tobacco: Never Used  Substance Use Topics  . Alcohol use: No  . Drug use: No    Review of Systems Constitutional: No fever/chills Eyes: No visual changes. ENT: No sore throat. Cardiovascular: Denies chest pain. Respiratory: Denies shortness of breath. Gastrointestinal: Positive for abdominal pain.  No nausea, no vomiting.  No diarrhea.  No constipation. Genitourinary: Negative for dysuria. Musculoskeletal: Negative for back pain. Skin: Negative for rash. Neurological: Negative for headaches, focal weakness or numbness.   ____________________________________________   PHYSICAL EXAM:  VITAL SIGNS: ED Triage Vitals  Enc Vitals Group     BP 10/31/17 1342 (!) 142/82     Pulse Rate 10/31/17 1342 89     Resp 10/31/17 1342 15     Temp 10/31/17 1342 98.1 F (36.7 C)     Temp Source 10/31/17 1342 Oral     SpO2 10/31/17 1342 96 %     Weight 10/31/17 1339 114 lb (51.7 kg)     Height 10/31/17 1339 5\' 1"  (1.549 m)     Head Circumference --      Peak Flow --      Pain Score 10/31/17 1339 0     Pain Loc --      Pain Edu? --      Excl. in [place]? --     Constitutional: Strange affect obviously delusional no diaphoresis speaks in full clear sentences Eyes: PERRL EOMI. Head: Atraumatic. Nose: No congestion/rhinnorhea. Mouth/Throat: No trismus Neck: No stridor.   Cardiovascular: Normal rate, regular rhythm. Grossly normal heart sounds.  Good peripheral circulation. Respiratory: Normal respiratory effort.  No retractions. Lungs CTAB and moving good air Gastrointestinal: Soft nondistended nontender no rebound or guarding no peritonitis no McBurney's tenderness negative Rovsing's no costovertebral tenderness ultrasound of her abdomen shows normal abdominal aorta and an empty uterus with no free fluid Musculoskeletal: No lower extremity edema   Neurologic:  Normal speech and language. No gross focal neurologic deficits are  appreciated. Skin:  Skin is warm, dry and intact. No rash noted. Psychiatric: Bizarre affect    ____________________________________________   DIFFERENTIAL includes but not limited to  Psychotic disorder, appendicitis, diverticulitis, drug overdose ____________________________________________   LABS (all labs ordered are listed, but only abnormal results are displayed)  Labs Reviewed  HCG, QUANTITATIVE, PREGNANCY - Abnormal; Notable for the following components:      Result Value   hCG, Beta Chain, Quant, S 5 (*)    All other components within normal limits  ACETAMINOPHEN LEVEL - Abnormal; Notable for the following components:   Acetaminophen (Tylenol), Serum <10 (*)    All other components within normal limits  COMPREHENSIVE METABOLIC PANEL - Abnormal; Notable for the following components:   Glucose, Bld 104 (*)    All other components within normal limits  URINE DRUG SCREEN, QUALITATIVE ([facility] ONLY) - Abnormal; Notable for the following components:   Benzodiazepine, Ur Scrn TEST  NOT PERFORMED, REAGENT NOT AVAILABLE (*)    All other components within normal limits  ETHANOL  CBC WITH DIFFERENTIAL/PLATELET  SALICYLATE LEVEL    Lab work reviewed by me with no acute disease __________________________________________  EKG   ____________________________________________  RADIOLOGY   ____________________________________________   PROCEDURES  Procedure(s) performed: no  Procedures  Critical Care performed: no  ____________________________________________   INITIAL IMPRESSION / ASSESSMENT AND PLAN / ED COURSE  Pertinent labs & imaging results that were available during my care of the patient were reviewed by me and considered in my medical decision making (see chart for details).   As part of my medical decision making, I reviewed the following data within the [place] History obtained from family if available, nursing notes, old chart and ekg,  as well as notes from prior ED visits.  The patient has delusions of pregnancy despite her being postmenopausal.  She reports compliance with her psychiatric medications and has not seen her psychiatrist in some time.  I am placing her under involuntary commitment.  No indication for medication at this point as the patient is calm and cooperative.  She consents to stay to speak with a psychiatrist today.      ____________________________________________   FINAL CLINICAL IMPRESSION(S) / ED DIAGNOSES  Final diagnoses:  Delusional disorder ([place])      NEW MEDICATIONS STARTED DURING THIS VISIT:  New Prescriptions   No medications on file     Note:  This document was prepared using Dragon voice recognition software and may include unintentional dictation errors.     Darel Hong, MD 10/31/17 1558

## 2017-10-31 NOTE — ED Notes (Addendum)
While dressing out pt stated "I am just trying to do what is right for my baby. I don't want anyone thinking I am a bad parent and call social services on me."

## 2017-10-31 NOTE — Consult Note (Signed)
Patient presents with delusional disorder. Admit to psychiatry. Admission orders in place.

## 2017-10-31 NOTE — ED Notes (Signed)
BEHAVIORAL HEALTH ROUNDING Patient sleeping: No. Patient alert and oriented: yes Behavior appropriate: Yes.  ; If no, describe:  Nutrition and fluids offered: yes Toileting and hygiene offered: Yes  Sitter present: q15 minute observations and security monitoring Law enforcement present: Yes    

## 2017-10-31 NOTE — ED Notes (Signed)
Report to include Situation, Background, Assessment, and Recommendations received from Jadeka RN. Patient alert and oriented, warm and dry, in no acute distress. Patient denies SI, HI, AVH and pain. Patient made aware of Q15 minute rounds and security cameras for their safety. Patient instructed to come to me with needs or concerns. 

## 2017-10-31 NOTE — BH Assessment (Signed)
Patient is to be admitted to [facility] by Dr. Weber Cooks.  Attending Physician will be Dr. Bary Leriche.   Patient has been assigned to room 325, by [place] Nurse T'Ywan.   ER staff is aware of the admission:  Lattie Haw, ER Dian Situ   Dr. Kerman Passey, ER MD   Donneta Romberg, Patient's Nurse   Meredith Mody, Patient Access.

## 2017-10-31 NOTE — ED Triage Notes (Signed)
Pt reports is post menopausal pregnant and her MD sent her here because they did not have a doppler machine to hear fully however, pt states "They think they can hear the baby saying that his arm is being bit".

## 2017-10-31 NOTE — ED Notes (Addendum)
Pt dressed out into appropriate behavioral health clothing with this tech and Tracey,EDT in the rm. Pt belongings consist of blue sandals, tan pants, blue/green shirt, a white shirt, a blue suitcase with toiletries, a red purse that was placed in the suitcase, white bra and orange panties. Pt calm and cooperative while dressing out.

## 2017-10-31 NOTE — Telephone Encounter (Signed)
Patient walk in with c/o fetus dying. She reports that baby said " there is a bat trying to chew my arm" she reports that she is concern for the baby and wants to make sure the baby is doing ok. Explained to patient we do not have the proper equipment to check on fetal sound and next available appointment with provider was at 2:40 patient states she prefers to go to the ER.  Thanks,  -[name]

## 2017-10-31 NOTE — Telephone Encounter (Signed)
Yes agreed

## 2017-11-01 ENCOUNTER — Other Ambulatory Visit: Payer: Self-pay

## 2017-11-01 DIAGNOSIS — F22 Delusional disorders: Principal | ICD-10-CM

## 2017-11-01 LAB — HEMOGLOBIN A1C
HEMOGLOBIN A1C: 5.1 % (ref 4.8–5.6)
MEAN PLASMA GLUCOSE: 99.67 mg/dL

## 2017-11-01 LAB — LIPID PANEL
Cholesterol: 214 mg/dL — ABNORMAL HIGH (ref 0–200)
HDL: 63 mg/dL (ref >40)
LDL CALC: 105 mg/dL — AB (ref 0–99)
Total CHOL/HDL Ratio: 3.4 RATIO
Triglycerides: 229 mg/dL — ABNORMAL HIGH (ref <150)
VLDL: 46 mg/dL — ABNORMAL HIGH (ref 0–40)

## 2017-11-01 LAB — TSH: TSH: 0.063 u[IU]/mL — AB (ref 0.350–4.500)

## 2017-11-01 MED ORDER — RISPERIDONE 1 MG PO TABS: 1.0000 mg | ORAL_TABLET | Freq: Every day | ORAL | Status: DC

## 2017-11-01 MED ORDER — TRAZODONE HCL 100 MG PO TABS: 100.0000 mg | ORAL_TABLET | Freq: Every evening | ORAL | Status: DC | PRN

## 2017-11-01 MED ORDER — ACETAMINOPHEN 325 MG PO TABS: 650.0000 mg | ORAL_TABLET | Freq: Four times a day (QID) | ORAL | Status: DC | PRN

## 2017-11-01 MED ORDER — RISPERIDONE 1 MG PO TABS: 2.0000 mg | ORAL_TABLET | Freq: Every day | ORAL | Status: DC

## 2017-11-01 MED ORDER — RISPERIDONE 1 MG PO TABS
2.0000 mg | ORAL_TABLET | Freq: Two times a day (BID) | ORAL | Status: DC
  Administered 2017-11-01 (×2): 2 mg via ORAL
  Filled 2017-11-01 (×2): qty 2

## 2017-11-01 MED ORDER — MAGNESIUM HYDROXIDE 400 MG/5ML PO SUSP: 30.0000 mL | Freq: Every day | ORAL | Status: DC | PRN

## 2017-11-01 MED ORDER — SIMETHICONE 80 MG PO CHEW
80.0000 mg | CHEWABLE_TABLET | Freq: Four times a day (QID) | ORAL | Status: DC
  Administered 2017-11-01 – 2017-11-02 (×3): 80 mg via ORAL
  Filled 2017-11-01 (×3): qty 1

## 2017-11-01 MED ORDER — HYDROXYZINE HCL 25 MG PO TABS: 25.0000 mg | ORAL_TABLET | Freq: Three times a day (TID) | ORAL | Status: DC | PRN

## 2017-11-01 MED ORDER — ALUM & MAG HYDROXIDE-SIMETH 200-200-20 MG/5ML PO SUSP
15.0000 mL | Freq: Four times a day (QID) | ORAL | Status: DC | PRN
Start: 2017-11-01 — End: 2017-11-02

## 2017-11-01 MED ORDER — LEVOTHYROXINE SODIUM 75 MCG PO TABS
75.0000 ug | ORAL_TABLET | Freq: Every day | ORAL | Status: DC
  Administered 2017-11-01 – 2017-11-02 (×2): 75 ug via ORAL
  Filled 2017-11-01 (×2): qty 1

## 2017-11-01 NOTE — BHH Group Notes (Signed)
[place] Group Notes:  (Nursing/MHT/Case Management/Adjunct)  Date:  11/01/2017  Time:  9:42 PM  Type of Therapy:  Group Therapy  Participation Level:  Active  Participation Quality:  Appropriate  Affect:  Appropriate  Cognitive:  Appropriate  Insight:  Good  Engagement in Group:  Engaged  Modes of Intervention:  Support  Summary of Progress/Problems:  Diamond Lucas 11/01/2017, 9:42 PM

## 2017-11-01 NOTE — BHH Suicide Risk Assessment (Signed)
[place] INPATIENT:  Family/Significant Other Suicide Prevention Education  Suicide Prevention Education:  Patient Refusal for Family/Significant Other Suicide Prevention Education: The patient Diamond Lucas has refused to provide written consent for family/significant other to be provided Family/Significant Other Suicide Prevention Education during admission and/or prior to discharge.  Physician notified.  Devona Konig, LCSW 11/01/2017, 2:13 PM

## 2017-11-01 NOTE — Tx Team (Signed)
Interdisciplinary Treatment and Diagnostic Plan Update  11/01/2017 Time of Session: 10:30 AM Diamond Lucas MRN: [id number]  Principal Diagnosis: Delusional disorder [facility])  Secondary Diagnoses: Principal Problem:   Delusional disorder ([place]) Active Problems:   Hypothyroidism, unspecified   Current Medications:  Current Facility-Administered Medications  Medication Dose Route Frequency Provider Last Rate Last Dose  . acetaminophen (TYLENOL) tablet 650 mg  650 mg Oral Q6H PRN Pucilowska, Jolanta B, MD      . hydrOXYzine (ATARAX/VISTARIL) tablet 25 mg  25 mg Oral TID PRN Pucilowska, Jolanta B, MD      . levothyroxine (SYNTHROID, LEVOTHROID) tablet 75 mcg  75 mcg Oral QAC breakfast Pucilowska, Jolanta B, MD   75 mcg at 11/01/17 0826  . magnesium hydroxide (MILK OF MAGNESIA) suspension 30 mL  30 mL Oral Daily PRN Pucilowska, Jolanta B, MD      . risperiDONE (RISPERDAL) tablet 2 mg  2 mg Oral BID Pucilowska, Jolanta B, MD   2 mg at 11/01/17 0826  . traZODone (DESYREL) tablet 100 mg  100 mg Oral QHS PRN Pucilowska, Jolanta B, MD       PTA Medications: Medications Prior to Admission  Medication Sig Dispense Refill Last Dose  . Calcium Carbonate-Vit D-Min (CALCIUM 600+D PLUS MINERALS) 600-400 MG-UNIT TABS Take by mouth daily.    Taking  . Carboxymeth-Glyc-Polysorb PF (REFRESH OPTIVE MEGA-3) 0.5-1-0.5 % SOLN Apply 2 drops to eye 2 (two) times daily.   Taking  . ibuprofen (ADVIL,MOTRIN) 600 MG tablet Take 1 tablet (600 mg total) by mouth every 8 (eight) hours as needed. 30 tablet 0 Taking  . levothyroxine (SYNTHROID, LEVOTHROID) 75 MCG tablet TAKE 1 TABLET BY MOUTH DAILY   Taking  . meclizine (ANTIVERT) 12.5 MG tablet Take 1 tablet (12.5 mg total) by mouth 3 (three) times daily as needed for dizziness. (Patient not taking: Reported on 09/08/2017) 30 tablet 3 Not Taking  . Multiple Vitamin (MULTI-VITAMINS) TABS daily.    Taking  . Omega-3 Fatty Acids (FISH OIL) 1000 MG CAPS Take by mouth daily.    Taking  . risperiDONE (RISPERDAL) 2 MG tablet Take 1 tablet (2 mg total) by mouth at bedtime. 30 tablet 0 Taking  . triamcinolone (NASACORT ALLERGY 24HR) 55 MCG/ACT AERO nasal inhaler Place 2 sprays into the nose daily.   Taking  . triamcinolone cream (KENALOG) 0.1 % Apply 1 application topically 2 (two) times daily. 80 g 0 Taking    Patient Stressors: Financial difficulties Medication change or noncompliance  Patient Strengths: Network engineer for treatment/growth  Treatment Modalities: Medication Management, Group therapy, Case management,  1 to 1 session with clinician, Psychoeducation, Recreational therapy.   Physician Treatment Plan for Primary Diagnosis: Delusional disorder ([place]) Long Term Goal(s):     Short Term Goals:    Medication Management: Evaluate patient's response, side effects, and tolerance of medication regimen.  Therapeutic Interventions: 1 to 1 sessions, Unit Group sessions and Medication administration.  Evaluation of Outcomes: Progressing  Physician Treatment Plan for Secondary Diagnosis: Principal Problem:   Delusional disorder ([place]) Active Problems:   Hypothyroidism, unspecified  Long Term Goal(s):     Short Term Goals:       Medication Management: Evaluate patient's response, side effects, and tolerance of medication regimen.  Therapeutic Interventions: 1 to 1 sessions, Unit Group sessions and Medication administration.  Evaluation of Outcomes: Progressing   RN Treatment Plan for Primary Diagnosis: Delusional disorder ([place]) Long Term Goal(s): Knowledge of disease and therapeutic regimen to  maintain health will improve  Short Term Goals: Ability to remain free from injury will improve, Ability to identify and develop effective coping behaviors will improve and Compliance with prescribed medications will improve  Medication Management: RN will administer medications as ordered by provider, will assess  and evaluate patient's response and provide education to patient for prescribed medication. RN will report any adverse and/or side effects to prescribing provider.  Therapeutic Interventions: 1 on 1 counseling sessions, Psychoeducation, Medication administration, Evaluate responses to treatment, Monitor vital signs and CBGs as ordered, Perform/monitor CIWA, COWS, AIMS and Fall Risk screenings as ordered, Perform wound care treatments as ordered.  Evaluation of Outcomes: Progressing   LCSW Treatment Plan for Primary Diagnosis: Delusional disorder [facility]) Long Term Goal(s): Safe transition to appropriate next level of care at discharge, Engage patient in therapeutic group addressing interpersonal concerns.  Short Term Goals: Engage patient in aftercare planning with referrals and resources and Increase skills for wellness and recovery  Therapeutic Interventions: Assess for all discharge needs, 1 to 1 time with Social worker, Explore available resources and support systems, Assess for adequacy in community support network, Educate family and significant other(s) on suicide prevention, Complete Psychosocial Assessment, Interpersonal group therapy.  Evaluation of Outcomes: Progressing   Progress in Treatment: Attending groups: Yes. Participating in groups: Yes. Taking medication as prescribed: Yes. Toleration medication: Yes. Family/Significant other contact made: No, will contact:  Pt refuses to have a support person involved in her treatment at this time. Patient understands diagnosis: Yes. Discussing patient identified problems/goals with staff: Yes. Medical problems stabilized or resolved: Yes. Denies suicidal/homicidal ideation: Yes. Issues/concerns per patient self-inventory: No. Other: n/a  New problem(s) identified: No, Describe:  No new problems identified.  New Short Term/Long Term Goal(s):  Patient Goals:  "I just want to go home so I can go swimming in the pool that I pay for  each month"  Discharge Plan or Barriers: Discharge plan is for pt to return to her home and resume medication management and OP therapy.  Reason for Continuation of Hospitalization: Delusions  Medication stabilization  Estimated Length of Stay: 3-5 days  Attendees: Patient: Diamond Lucas 11/01/2017 2:40 PM  Physician: Orson Slick, MD 11/01/2017 2:40 PM  Nursing: Polly Cobia, RN 11/01/2017 2:40 PM  RN Care Manager: 11/01/2017 2:40 PM  Social Worker: Derrek Gu, LCSW 11/01/2017 2:40 PM  Recreational Therapist: Roanna Epley, LRT 11/01/2017 2:40 PM  Other: Darin Engels, Ken Caryl 11/01/2017 2:40 PM  Other: Alden Hipp, LCSW 11/01/2017 2:40 PM  Other: 11/01/2017 2:40 PM    Scribe for Treatment Team: Devona Konig, LCSW 11/01/2017 2:40 PM

## 2017-11-01 NOTE — BHH Group Notes (Signed)
LCSW Group Therapy Note  11/01/2017 1:00 pm  Type of Therapy/Topic:  Group Therapy:  Emotion Regulation  Participation Level:  Active   Description of Group:    The purpose of this group is to assist patients in learning to regulate negative emotions and experience positive emotions. Patients will be guided to discuss ways in which they have been vulnerable to their negative emotions. These vulnerabilities will be juxtaposed with experiences of positive emotions or situations, and patients will be challenged to use positive emotions to combat negative ones. Special emphasis will be placed on coping with negative emotions in conflict situations, and patients will process healthy conflict resolution skills.  Therapeutic Goals: 1. Patient will identify two positive emotions or experiences to reflect on in order to balance out negative emotions 2. Patient will label two or more emotions that they find the most difficult to experience 3. Patient will demonstrate positive conflict resolution skills through discussion and/or role plays  Summary of Patient Progress:  Patient was able to actively participate in today's group discussion on emotion regulation.  Pt shared that she doesn't really experience one emotion for any long length of time, but the one emotion that she is currently experiencing is sadness.  Pt shared that she is having this feeling because she doesn't feel like she is being taken serious by the medical staff at the hospital in regards to her being pregnant.  Pt shared that one thing that she does to help her to having more emotion regulation is to watch movies and "get her head out of the negative emotions that she feels."     Therapeutic Modalities:   Cognitive Behavioral Therapy Feelings Identification Dialectical Behavioral Therapy

## 2017-11-01 NOTE — Plan of Care (Signed)
Patient states "I am resting well."Patient pleasant and cooperative on approach.Patient continues to have the delusion about the pregnancy.Patient stated that this "happened with the bat sperm." Patient verbalized that she is upset because her pregnancy is not taken care by the medical staff.Denies SI,HI and AVH.Compliant with medications.Patient stated that she likes to take risperdal at bedtime.Appetite fair.Energy level good.Support and encouragement given.

## 2017-11-01 NOTE — BHH Suicide Risk Assessment (Signed)
[facility] Discharge Suicide Risk Assessment   Principal Problem: Delusional disorder [facility]) Discharge Diagnoses:  Patient Active Problem List   Diagnosis Date Noted  . Delusional disorder ([place]) [F22] 11/01/2017    Priority: High  . Delusional disorder, somatic type ([place]) [F22] 10/07/2015    Priority: High  . Chronic venous insufficiency [I87.2] 10/07/2017  . Lymphedema [I89.0] 10/07/2017  . 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 Bipolar I disorder, most recent episode manic, severe with psychotic features ([place]) [F31.2] 02/17/2016  . HTN (hypertension) [I10] 10/07/2015  . Noncompliance [Z91.19] 10/06/2015  . Rosacea [L71.9] 02/09/2015  . Rhinitis, allergic [J30.9] 12/26/2014  . 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 Trichorrhexis [L67.0] 05/28/2013  . Reflux [K21.9] 09/26/2012  . Hypothyroidism, unspecified [E03.9] 03/13/2012  . Hirsutism [L68.0] 04/11/2011    Total Time spent with patient: 20 minutes  Musculoskeletal: Strength & Muscle Tone: within normal limits Gait & Station: normal Patient leans: N/A  Psychiatric Specialty Exam: Review of Systems  Neurological: Negative.   Psychiatric/Behavioral: Positive for hallucinations.  All other systems reviewed and are negative.   Blood pressure 91/60, pulse (!) 112, temperature 98 F (36.7 C), temperature source Oral, resp. rate (!) 9, height 5\' 1"  (1.549 m), weight 51.3 kg, last menstrual period 04/10/2015, SpO2 100 %.Body mass index is 21.35 kg/m.  General Appearance: Casual  Eye Contact::  Good  Speech:  Clear and Coherent409  Volume:  Normal  Mood:  Euthymic  Affect:  Flat  Thought Process:  Goal Directed and Descriptions of Associations: Intact  Orientation:  Full (Time, Place, and Person)  Thought Content:  Delusions and Paranoid Ideation  Suicidal Thoughts:  No  Homicidal Thoughts:  No  Memory:  Immediate;   Fair Recent;    Fair Remote;   Fair  Judgement:  Poor  Insight:  Lacking  Psychomotor Activity:  Normal  Concentration:  Fair  Recall:  [employer] of Knowledge:Fair  Language: Fair  Akathisia:  No  Handed:  Right  AIMS (if indicated):     Assets:  Communication Skills Desire for Improvement Financial Resources/Insurance Housing Physical Health Resilience Transportation Vocational/Educational  Sleep:  Number of Hours: 7  Cognition: WNL  ADL's:  Intact   Mental Status Per Nursing Assessment::   On Admission:  NA  Demographic Factors:  Divorced or widowed, Caucasian and Living alone  Loss Factors: NA  Historical Factors: Impulsivity  Risk Reduction Factors:   Positive therapeutic relationship  Continued Clinical Symptoms:  Schizophrenia:   Paranoid or undifferentiated type  Cognitive Features That Contribute To Risk:  None    Suicide Risk:  Minimal: No identifiable suicidal ideation.  Patients presenting with no risk factors but with morbid ruminations; may be classified as minimal risk based on the severity of the depressive symptoms    Plan Of Care/Follow-up recommendations:  Activity:  as tolerated Diet:  low sodium heart healty Other:  keep keep follow up appointments  Orson Slick, MD 11/02/2017, 8:10 AM

## 2017-11-01 NOTE — Progress Notes (Signed)
.  Admission Note:  64yr female who presents IVC in no acute distress for the treatment of Psychosis. Patient's thoughts are disorganized, she appears flat and depressed. Pt was calm and cooperative with admission process. Patient denies SI/HI/AVH but noted responding to internal stimuli. Patient appears delusional stating " I am pregnant" after several test indicate otherwise, besides she's menopausal. Skin was assessed and found to be clear of any abnormal marks in presence of [employer] M[employer], also she was searched for contraband none  found, POC and unit policies explained and understanding verbalized. Consents obtained. Food and fluids offered, and fluids accepted. 15 minutes safety checks maintained will continue to monitor.

## 2017-11-01 NOTE — Progress Notes (Signed)
Recreation Therapy Notes  Date: 11/01/2017  Time: 9:30 am  Location: Craft Room  Behavioral response: Appropriate  Intervention Topic: Goals  Discussion/Intervention:  Group content on today was focused on goals. Patients described what goals are and how they define goals. Individuals expressed how they go about setting goals and reaching them. The group identified how important goals are and if they make short term goals to reach long term goals. Patients described how many goals they work on at a time and what affects them not reaching their goal. Individuals described how much time they put into planning and obtaining their goals. The group participated in the intervention "My Goal Board" and made personal goal boards to help them achieve their goal. Clinical Observations/Feedback:  Patient came to group and stated her goal is retirement at this point. She explained that she normally memorizes her goals so she does not have to write them down.  Individual expressed that some people may be content with life and do not make any goals. Patient was social with peers and staff while participating in group.  Kamal Jurgens LRT/CTRS         [name] 11/01/2017 1:18 PM
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
## 2017-11-01 NOTE — Progress Notes (Signed)
Patient was visible in the milieu, pleasant and cooperative. Continues to endorse delusions, reporting that she is pregnant. Patient  Went to bed early and medication was taken to her in room. Currently in bed resting. No sign of distress.

## 2017-11-01 NOTE — H&P (Signed)
Psychiatric Admission Assessment Adult  Patient Identification: Diamond Lucas MRN:  [id number] Date of Evaluation:  11/01/2017 Chief Complaint:  schizophrenia Principal Diagnosis: Delusional disorder [facility]) Diagnosis:   Patient Active Problem List   Diagnosis Date Noted  . Delusional disorder ([place]) [F22] 11/01/2017    Priority: High  . Delusional disorder, somatic type ([place]) [F22] 10/07/2015    Priority: High  . Chronic venous insufficiency [I87.2] 10/07/2017  . Lymphedema [I89.0] 10/07/2017  . 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 Bipolar I disorder, most recent episode manic, severe with psychotic features ([place]) [F31.2] 02/17/2016  . HTN (hypertension) [I10] 10/07/2015  . Noncompliance [Z91.19] 10/06/2015  . Rosacea [L71.9] 02/09/2015  . Rhinitis, allergic [J30.9] 12/26/2014  . 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 Trichorrhexis [L67.0] 05/28/2013  . Reflux [K21.9] 09/26/2012  . Hypothyroidism, unspecified [E03.9] 03/13/2012  . Hirsutism [L68.0] 04/11/2011   History of Present Illness:   Identifying data. Diamond Lucas is a 57 year old female with a history of delusional disorder.  Chief complaint. "I am pregnant."  History fo present illness. Information was obtained from the patient, the chart and her psychiatrist. THe patient came to the ER from her PCP office. She initially went to see her OBGYN to have and [place] as she again felt pregnant with "magical or bat sperm". This is her fixed delusion and her OBGYN knows it but she is out of town. The patient was send to Urgent Care. From there to PCP and then ER. The patient was disorganized and adamant about checking her babies as she felt on of them was bitten in the arm. The patient denies any symptoms of depression, anxiety or psychosis. Diamond Lucas denies having mental illness and is unable to explain why a healthy person would have ACT  team. She denies substance abuse.  Spoke with her ACT psychiatrist, Dr, Ilean Skill. The patient has fixed delusions and is very resistant to any medication change. She accepts Risperdal but due to galactorrhea her Risperdal dose was lowered to 1 mg nightly. She has been compliant.   Past psychiatric history. Long history of fixed delusions. She was seen in our ER for delusion of infestation two years ago.  Family psychiatric history. Denies any.  Social history. She lives alone and works part time at [employer] She used to work for [employer]. Apparently had children of her own. She has some social network as she promises to bring a friend to confirm her story next time.  Total Time spent with patient: 1 hour  Is the patient at risk to self? No.  Has the patient been a risk to self in the past 6 months? No.  Has the patient been a risk to self within the distant past? No.  Is the patient a risk to others? No.  Has the patient been a risk to others in the past 6 months? No.  Has the patient been a risk to others within the distant past? No.   Prior Inpatient Therapy:   Prior Outpatient Therapy:    Alcohol Screening: 1. How often do you have a drink containing alcohol?: Never 2. How many drinks containing alcohol do you have on a typical day when you are drinking?: 1 or 2 3. How often do you have six or more drinks on one occasion?: Never AUDIT-C Score: 0 4. How often during the last year have you found that you were not able to stop drinking once you had started?: Never 5. How  often during the last year have you failed to do what was normally expected from you becasue of drinking?: Never 6. How often during the last year have you needed a first drink in the morning to get yourself going after a heavy drinking session?: Never 7. How often during the last year have you had a feeling of guilt of remorse after drinking?: Never 8. How often during the last year have you been unable to remember what  happened the night before because you had been drinking?: Never 9. Have you or someone else been injured as a result of your drinking?: No 10. Has a relative or friend or a doctor or another health worker been concerned about your drinking or suggested you cut down?: No Alcohol Use Disorder Identification Test Final Score (AUDIT): 0 Intervention/Follow-up: AUDIT Score <7 follow-up not indicated Substance Abuse History in the last 12 months:  No. Consequences of Substance Abuse: NA Previous Psychotropic Medications: Yes  Psychological Evaluations: No  Past Medical History:  Past Medical History:  Diagnosis Date  . Allergy   . Arrhythmia   . History of chicken pox   . History of measles   . History of syncope   . Osteoarthritis   . Rosacea   . Schizophrenia ([place])   . Thyroid disease     Past Surgical History:  Procedure Laterality Date  . FOOT SURGERY Right   . THYROIDECTOMY     Family History:  Family History  Problem Relation Age of Onset  . Depression Mother   . Cataracts Mother   . Stroke Mother        mini  . Cancer Other   . Heart failure Other   . Heart disease Other   . Stroke Other   . Diabetes Other   . Kidney cancer Father   . Anxiety disorder Brother    Tobacco Screening: Have you used any form of tobacco in the last 30 days? (Cigarettes, Smokeless Tobacco, Cigars, and/or Pipes): No Social History:  Social History   Substance and Sexual Activity  Alcohol Use No     Social History   Substance and Sexual Activity  Drug Use No    Additional Social History: Marital status: Divorced Divorced, when?: 1998 What types of issues is patient dealing with in the relationship?: n/a Additional relationship information: None noted Are you sexually active?: No What is your sexual orientation?: Heterosexual Has your sexual activity been affected by drugs, alcohol, medication, or emotional stress?: No Does patient have children?: No(Pt shared that she had  children, but "they" were adopted)                         Allergies:   Allergies  Allergen Reactions  . Diclofenac Sodium Palpitations  . Naproxen Itching, Rash and Hives  . Olanzapine     Other reaction(s): Other Cross-eyed and hyperventilation  . Dust Mite Mixed Allergen Ext [Mite (D. Farinae)] Itching    Respiratory illness, and itchy eyes, eye problems  . Gluten Meal Other (See Comments)    Abdominal bloating  . Nsaids     Other reaction(s): Other (See Comments) Aleve causes hives  . Other Itching    Respiratory illness, and itchy eyes, eye problems Respiratory illness, and itchy eyes, eye problems   Lab Results:  Results for orders placed or performed during the hospital encounter of 10/31/17 (from the past 48 hour(s))  Hemoglobin A1c     Status: None  Collection Time: 10/31/17  1:52 PM  Result Value Ref Range   Hgb A1c MFr Bld 5.1 4.8 - 5.6 %    Comment: (NOTE) Pre diabetes:          5.7%-6.4% Diabetes:              >6.4% Glycemic control for   <7.0% adults with diabetes    Mean Plasma Glucose 99.67 mg/dL    Comment: Performed at [facility] [address], [place], [place] [address]  Lipid panel     Status: Abnormal   Collection Time: 10/31/17  1:52 PM  Result Value Ref Range   Cholesterol 214 (H) 0 - 200 mg/dL   Triglycerides 229 (H) <150 mg/dL   HDL 63 >40 mg/dL   Total CHOL/HDL Ratio 3.4 RATIO   VLDL 46 (H) 0 - 40 mg/dL   LDL Cholesterol 105 (H) 0 - 99 mg/dL    Comment:        Total Cholesterol/HDL:CHD Risk Coronary Heart Disease Risk Table                     Men   Women  1/2 Average Risk   3.4   3.3  Average Risk       5.0   4.4  2 X Average Risk   9.6   7.1  3 X Average Risk  23.4   11.0        Use the calculated Patient Ratio above and the CHD Risk Table to determine the patient's CHD Risk.        ATP III CLASSIFICATION (LDL):  <100     mg/dL   Optimal  100-129  mg/dL   Near or Above                    Optimal  130-159   mg/dL   Borderline  160-189  mg/dL   High  >190     mg/dL   Very High Performed at [facility], [address] [place], [place] [address]   TSH     Status: Abnormal   Collection Time: 10/31/17  1:52 PM  Result Value Ref Range   TSH 0.063 (L) 0.350 - 4.500 uIU/mL    Comment: Performed by a 3rd Generation assay with a functional sensitivity of <=0.01 uIU/mL. Performed at [facility], [address] [place],  [address]     Blood Alcohol level:  Lab Results  Component Value Date   [facility] <10 10/31/2017   ETH <5 23/30/0762    Metabolic Disorder Labs:  Lab Results  Component Value Date   HGBA1C 5.1 10/31/2017   MPG 99.67 10/31/2017   Lab Results  Component Value Date   PROLACTIN 131.2 (H) 02/21/2016   PROLACTIN 10.7 10/07/2015   Lab Results  Component Value Date   CHOL 214 (H) 10/31/2017   TRIG 229 (H) 10/31/2017   HDL 63 10/31/2017   CHOLHDL 3.4 10/31/2017   VLDL 46 (H) 10/31/2017   LDLCALC 105 (H) 10/31/2017   LDLCALC 107 (H) 07/12/2017    Current Medications: Current Facility-Administered Medications  Medication Dose Route Frequency Provider Last Rate Last Dose  . acetaminophen (TYLENOL) tablet 650 mg  650 mg Oral Q6H PRN Yu Cragun B, MD      . alum & mag hydroxide-simeth (MAALOX/MYLANTA) 200-200-20 MG/5ML suspension 15 mL  15 mL Oral Q6H PRN McNew, Tyson Babinski, MD      . hydrOXYzine (ATARAX/VISTARIL)  tablet 25 mg  25 mg Oral TID PRN Jacobb Alen B, MD      . levothyroxine (SYNTHROID, LEVOTHROID) tablet 75 mcg  75 mcg Oral QAC breakfast Nasira Janusz B, MD   75 mcg at 11/01/17 0826  . magnesium hydroxide (MILK OF MAGNESIA) suspension 30 mL  30 mL Oral Daily PRN Shanya Ferriss B, MD      . Derrill Memo ON 11/02/2017] risperiDONE (RISPERDAL) tablet 1 mg  1 mg Oral QHS Callan Yontz B, MD      . simethicone (MYLICON) chewable tablet 80 mg  80 mg Oral QID Johnwilliam Shepperson B, MD   80 mg at 11/01/17 1800  . traZODone  (DESYREL) tablet 100 mg  100 mg Oral QHS PRN Shirline Kendle B, MD       PTA Medications: Medications Prior to Admission  Medication Sig Dispense Refill Last Dose  . Calcium Carbonate-Vit D-Min (CALCIUM 600+D PLUS MINERALS) 600-400 MG-UNIT TABS Take by mouth daily.    Taking  . Carboxymeth-Glyc-Polysorb PF (REFRESH OPTIVE MEGA-3) 0.5-1-0.5 % SOLN Apply 2 drops to eye 2 (two) times daily.   Taking  . ibuprofen (ADVIL,MOTRIN) 600 MG tablet Take 1 tablet (600 mg total) by mouth every 8 (eight) hours as needed. 30 tablet 0 Taking  . levothyroxine (SYNTHROID, LEVOTHROID) 75 MCG tablet TAKE 1 TABLET BY MOUTH DAILY   Taking  . meclizine (ANTIVERT) 12.5 MG tablet Take 1 tablet (12.5 mg total) by mouth 3 (three) times daily as needed for dizziness. (Patient not taking: Reported on 09/08/2017) 30 tablet 3 Not Taking  . Multiple Vitamin (MULTI-VITAMINS) TABS daily.    Taking  . Omega-3 Fatty Acids (FISH OIL) 1000 MG CAPS Take by mouth daily.   Taking  . risperiDONE (RISPERDAL) 2 MG tablet Take 1 tablet (2 mg total) by mouth at bedtime. 30 tablet 0 Taking  . triamcinolone (NASACORT ALLERGY 24HR) 55 MCG/ACT AERO nasal inhaler Place 2 sprays into the nose daily.   Taking  . triamcinolone cream (KENALOG) 0.1 % Apply 1 application topically 2 (two) times daily. 80 g 0 Taking    Musculoskeletal: Strength & Muscle Tone: within normal limits Gait & Station: normal Patient leans: N/A  Psychiatric Specialty Exam: I reviewed physical exam performed in the ER and agree with the findings. Physical Exam  Nursing note and vitals reviewed. Psychiatric: Her speech is normal and behavior is normal. Her affect is blunt. Thought content is paranoid and delusional. Cognition and memory are normal. She expresses impulsivity.    Review of Systems  Neurological: Negative.   Psychiatric/Behavioral: Positive for hallucinations.  All other systems reviewed and are negative.   Blood pressure (!) 151/94, pulse 76,  temperature 98 F (36.7 C), temperature source Oral, resp. rate 16, height 5\' 1"  (1.549 m), weight 51.3 kg (113 lb), last menstrual period 04/10/2015, SpO2 99 %.Body mass index is 21.35 kg/m.  See SRA                                                  Sleep:  Number of Hours: 4.45    Treatment Plan Summary: Daily contact with patient to assess and evaluate symptoms and progress in treatment and Medication management   Diamond Lucas is a 57 year old female with a history of paranoid delusions admitted for worsening of her symptoms.  #Psychosis -continue Risperdal 1 mg nightly -patient  resists dose increase or injectable antipsychotic use -Trazodone 100 mg nightly PRN  #Hypothyroidism -Synthroid 75 um daily  #Constipation -bowel regimen  #Labs -lipid profile, TSH, A1C -EKG -pregnancy test negative  #Disposition -discharge to home -follow up with [name]   Observation Level/Precautions:  15 minute checks  Laboratory:  CBC Chemistry Profile UDS UA  Psychotherapy:    Medications:    Consultations:    Discharge Concerns:    Estimated LOS:  Other:     Physician Treatment Plan for Primary Diagnosis: Delusional disorder ([place]) Long Term Goal(s): Improvement in symptoms so as ready for discharge  Short Term Goals: Ability to identify changes in lifestyle to reduce recurrence of condition will improve, Ability to verbalize feelings will improve, Ability to disclose and discuss suicidal ideas, Ability to demonstrate self-control will improve, Ability to identify and develop effective coping behaviors will improve, Ability to maintain clinical measurements within normal limits will improve and Ability to identify triggers associated with substance abuse/mental health issues will improve  Physician Treatment Plan for Secondary Diagnosis: Principal Problem:   Delusional disorder ([place]) Active Problems:   Hypothyroidism, unspecified  Long Term  Goal(s): NA  Short Term Goals: NA  I certify that inpatient services furnished can reasonably be expected to improve the patient's condition.    Orson Slick, MD 8/7/20197:59 PM

## 2017-11-01 NOTE — Plan of Care (Signed)
Newly admitted. Adjusting  well to the unit

## 2017-11-01 NOTE — Tx Team (Signed)
Initial Treatment Plan 11/01/2017 12:25 AM Diamond Lucas BWG:[id number]    PATIENT STRESSORS: Financial difficulties Medication change or noncompliance   PATIENT STRENGTHS: Curator fund of knowledge Motivation for treatment/growth   PATIENT IDENTIFIED PROBLEMS: Psychosis     Depression                  DISCHARGE CRITERIA:  Improved stabilization in mood, thinking, and/or behavior Motivation to continue treatment in a less acute level of care  PRELIMINARY DISCHARGE PLAN: Outpatient therapy  PATIENT/FAMILY INVOLVEMENT: This treatment plan has been presented to and reviewed with the patient, Diamond Lucas, The patient and family have been given the opportunity to ask questions and make suggestions.  Harl Bowie, RN 11/01/2017, 12:25 AM

## 2017-11-01 NOTE — Progress Notes (Signed)
Patient ID: Diamond Lucas, female   DOB: 1960-09-05, 57 y.o.   MRN: [id number] PER STATE REGULATIONS 482.30  THIS CHART WAS REVIEWED FOR MEDICAL NECESSITY WITH RESPECT TO THE PATIENT'S ADMISSION/DURATION OF STAY.  NEXT REVIEW DATE:11/04/17  Roma Schanz, RN, BSN CASE MANAGER

## 2017-11-01 NOTE — [facility] Suicide Risk Assessment (Signed)
[facility] Admission Suicide Risk Assessment   Nursing information obtained from:  Patient Demographic factors:  Caucasian, Low socioeconomic status, Living alone Current Mental Status:  NA Loss Factors:  Financial problems / change in socioeconomic status Historical Factors:  NA Risk Reduction Factors:  Religious beliefs about death, Employed  Total Time spent with patient: 1 hour Principal Problem: Delusional disorder ([place]) Diagnosis:   Patient Active Problem List   Diagnosis Date Noted  . Delusional disorder ([place]) [F22] 11/01/2017    Priority: High  . Delusional disorder, somatic type ([place]) [F22] 10/07/2015    Priority: High  . Chronic venous insufficiency [I87.2] 10/07/2017  . Lymphedema [I89.0] 10/07/2017  . Delusion of infestation ([place]) [F22] 09/15/2016  . 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 Bipolar I disorder, most recent episode manic, severe with psychotic features ([place]) [F31.2] 02/17/2016  . HTN (hypertension) [I10] 10/07/2015  . Noncompliance [Z91.19] 10/06/2015  . Rosacea [L71.9] 02/09/2015  . Rhinitis, allergic [J30.9] 12/26/2014  . 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 Trichorrhexis [L67.0] 05/28/2013  . Reflux [K21.9] 09/26/2012  . Hypothyroidism, unspecified [E03.9] 03/13/2012  . Hirsutism [L68.0] 04/11/2011   Subjective Data: paranoid delusions  Continued Clinical Symptoms:  Alcohol Use Disorder Identification Test Final Score (AUDIT): 0 The "Alcohol Use Disorders Identification Test", Guidelines for Use in Primary Care, Second Edition.  World Pharmacologist [facility]). Score between 0-7:  no or low risk or alcohol related problems. Score between 8-15:  moderate risk of alcohol related problems. Score between 16-19:  high risk of alcohol related problems. Score 20 or above:  warrants further diagnostic evaluation for alcohol dependence and treatment.   CLINICAL FACTORS:   Schizophrenia:   Paranoid or  undifferentiated type   Musculoskeletal: Strength & Muscle Tone: within normal limits Gait & Station: normal Patient leans: N/A  Psychiatric Specialty Exam: Physical Exam  Nursing note and vitals reviewed. Psychiatric: Her speech is normal and behavior is normal. Her affect is blunt. Thought content is paranoid and delusional. Cognition and memory are normal. She expresses impulsivity.    Review of Systems  Neurological: Negative.   Psychiatric/Behavioral: Positive for hallucinations.  All other systems reviewed and are negative.   Blood pressure (!) 151/94, pulse 76, temperature 98 F (36.7 C), temperature source Oral, resp. rate 16, height 5\' 1"  (1.549 m), weight 51.3 kg (113 lb), last menstrual period 04/10/2015, SpO2 99 %.Body mass index is 21.35 kg/m.  General Appearance: Casual  Eye Contact:  Good  Speech:  Clear and Coherent  Volume:  Normal  Mood:  Euthymic  Affect:  Flat  Thought Process:  Goal Directed and Descriptions of Associations: Intact  Orientation:  Full (Time, Place, and Person)  Thought Content:  Delusions and Paranoid Ideation  Suicidal Thoughts:  No  Homicidal Thoughts:  No  Memory:  Immediate;   Fair Recent;   Fair Remote;   Fair  Judgement:  Poor  Insight:  Lacking  Psychomotor Activity:  Normal  Concentration:  Concentration: Fair and Attention Span: Fair  Recall:  [employer] of Knowledge:  Fair  Language:  Fair  Akathisia:  No  Handed:  Right  AIMS (if indicated):     Assets:  Communication Skills Desire for Improvement Financial Resources/Insurance Housing Physical Health Resilience Social Support Transportation Vocational/Educational  ADL's:  Intact  Cognition:  WNL  Sleep:  Number of Hours: 4.45      COGNITIVE FEATURES THAT CONTRIBUTE TO RISK:  None    SUICIDE RISK:   Minimal: No identifiable suicidal ideation.  Patients presenting with no risk factors but with morbid ruminations; may be classified as minimal risk based on  the severity of the depressive symptoms  PLAN OF CARE: hospital admission, medication management, discharge planning.  Diamond Lucas is a 57 year old female with a history of paranoid delusions admitted for worsening of her symptoms.  #Psychosis -continue Risperdal 1 mg nightly -patient resists dose increase or injectable antipsychotic use -Trazodone 100 mg nightly PRN  #Hypothyroidism -Synthroid 75 um daily  #Constipation -bowel regimen  #Labs -lipid profile, TSH, A1C -EKG -pregnancy test negative  #Disposition -discharge to home -follow up with [name]  #  I certify that inpatient services furnished can reasonably be expected to improve the patient's condition.   Orson Slick, MD 11/01/2017, 7:51 PM

## 2017-11-01 NOTE — Discharge Summary (Addendum)
Physician Discharge Summary Note  Patient:  Diamond Lucas is an 57 y.o., female MRN:  [id number] DOB:  30-Apr-1960 Patient phone:  [phone] (home)  Patient address:   [place] [address],  Total Time spent with patient: 20 minutes plus 15 min on care coordination and documentation.  Date of Admission:  10/31/2017 Date of Discharge: 11/02/2017  Reason for Admission:  Paranoid delusions.  History of Present Illness:   Identifying data. Diamond Lucas is a 57 year old female with a history of delusional disorder.  Chief complaint. "I am pregnant."  History fo present illness. Information was obtained from the patient, the chart and her psychiatrist. THe patient came to the ER from her PCP office. She initially went to see her OBGYN to have and [place] as she again felt pregnant with "magical or bat sperm". This is her fixed delusion and her OBGYN knows it but she is out of town. The patient was send to Urgent Care. From there to PCP and then ER. The patient was disorganized and adamant about checking her babies as she felt on of them was bitten in the arm. The patient denies any symptoms of depression, anxiety or psychosis. Diamond Lucas denies having mental illness and is unable to explain why a healthy person would have ACT team. She denies substance abuse.  Spoke with her ACT psychiatrist, Diamond Lucas. The patient has fixed delusions and is very resistant to any medication change. She accepts Risperdal but due to galactorrhea her Risperdal dose was lowered to 1 mg nightly. Of note, her ACT team was unaware that the patient came to the ER. She was seem by ACT team on the same day.  Past psychiatric history. Long history of fixed delusions. She was seen in our ER for delusion of infestation two years ago.  Family psychiatric history. Denies any.  Social history. She lives alone and works part time at [employer] She used to work for [employer]. Apparently had children of her own. She  has some social network as she promises to bring a friend to confirm her story next time.  Principal Problem: Delusional disorder [facility]) Discharge Diagnoses: Patient Active Problem List   Diagnosis Date Noted  . Delusional disorder ([place]) [F22] 11/01/2017    Priority: High  . Delusional disorder, somatic type ([place]) [F22] 10/07/2015    Priority: High  . Chronic venous insufficiency [I87.2] 10/07/2017  . Lymphedema [I89.0] 10/07/2017  . Delusion of infestation ([place]) [F22] 09/15/2016  . 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 Bipolar I disorder, most recent episode manic, severe with psychotic features ([place]) [F31.2] 02/17/2016  . HTN (hypertension) [I10] 10/07/2015  . Noncompliance [Z91.19] 10/06/2015  . Rosacea [L71.9] 02/09/2015  . Rhinitis, allergic [J30.9] 12/26/2014  . 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 Trichorrhexis [L67.0] 05/28/2013  . Reflux [K21.9] 09/26/2012  . Hypothyroidism, unspecified [E03.9] 03/13/2012  . Hirsutism [L68.0] 04/11/2011    Past Medical History:  Past Medical History:  Diagnosis Date  . Allergy   . Arrhythmia   . History of chicken pox   . History of measles   . History of syncope   . Osteoarthritis   . Rosacea   . Schizophrenia ([place])   . Thyroid disease     Past Surgical History:  Procedure Laterality Date  . FOOT SURGERY Right   . THYROIDECTOMY     Family History:  Family History  Problem Relation Age of Onset  . Depression Mother   . Cataracts Mother   .  Stroke Mother        mini  . Cancer Other   . Heart failure Other   . Heart disease Other   . Stroke Other   . Diabetes Other   . Kidney cancer Father   . Anxiety disorder Brother    Social History:  Social History   Substance and Sexual Activity  Alcohol Use No     Social History   Substance and Sexual Activity  Drug Use No    Social History   Socioeconomic History  . Marital status: Single    Spouse name: Not on  file  . Number of children: 0  . Years of education: Not on file  . Highest education level: Bachelor's degree (e.g., BA, AB, BS)  Occupational History  . 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 Financial resource strain: Not hard at all  . Food insecurity:    Worry: Never true    Inability: Never true  . Transportation needs:    Medical: No    Non-medical: No  Tobacco Use  . Smoking status: Never Smoker  . Smokeless tobacco: Never Used  Substance and Sexual Activity  . Alcohol use: No  . Drug use: No  . Sexual activity: Not Currently    Birth control/protection: Post-menopausal, Abstinence  Lifestyle  . Physical activity:    Days per week: Not on file    Minutes per session: Not on file  . Stress: Only a little  Relationships  . Social connections:    Talks on phone: Not on file    Gets together: Not on file    Attends religious service: Not on file    Active member of club or organization: Not on file    Attends meetings of clubs or organizations: Not on file    Relationship status: Not on file  Other Topics Concern  . Not on file  Social History Narrative  . Not on file    Hospital Course:    Diamond Lucas is a 57 year old female with a history of paranoid delusions admitted for worsening of her symptoms. The patient is willing to take 1 mg of Risperdal only, refuses medication change or dose adjustment. Refuses injectable long lasting antipsychotics.  #Psychosis, no change -continue Risperdal 1 mg nightly  #Hypothyroidism -continue Synthroid 75 um daily  #Labs -lipid profile shows elevated Chol and TG, TSH is low with normal T4, A1C is normal -EKG -pregnancy test negative  #Disposition -discharge to home -follow up with Diamond Lucas ACT team    Physical Findings: AIMS: Facial and Oral Movements Muscles of Facial Expression: None, normal Lips and Perioral Area: None, normal Jaw: None,  normal Tongue: None, normal,Extremity Movements Upper (arms, wrists, hands, fingers): None, normal Lower (legs, knees, ankles, toes): None, normal, Trunk Movements Neck, shoulders, hips: None, normal, Overall Severity Severity of abnormal movements (highest score from questions above): None, normal Incapacitation due to abnormal movements: None, normal Patient's awareness of abnormal movements (rate only patient's report): No Awareness, Dental Status Current problems with teeth and/or dentures?: No Does patient usually wear dentures?: No  CIWA:  CIWA-Ar Total: 4 COWS:  COWS Total Score: 0  Musculoskeletal: Strength & Muscle Tone: within normal limits Gait & Station: normal Patient leans: N/A  Psychiatric Specialty Exam: Physical Exam  Nursing note and vitals reviewed. Psychiatric: Her speech is normal and behavior is normal. Her affect is blunt. Thought content is delusional. Cognition  and memory are normal. She expresses impulsivity.    Review of Systems  Neurological: Negative.   Psychiatric/Behavioral: Negative.   All other systems reviewed and are negative.   Blood pressure 91/60, pulse (!) 112, temperature 98 F (36.7 C), temperature source Oral, resp. rate (!) 9, height 5\' 1"  (1.549 m), weight 51.3 kg, last menstrual period 04/10/2015, SpO2 100 %.Body mass index is 21.35 kg/m.  General Appearance: Casual  Eye Contact:  Good  Speech:  Clear and Coherent  Volume:  Normal  Mood:  Euthymic  Affect:  Blunt  Thought Process:  Goal Directed and Descriptions of Associations: Intact  Orientation:  Full (Time, Place, and Person)  Thought Content:  Delusions  Suicidal Thoughts:  No  Homicidal Thoughts:  No  Memory:  Immediate;   Fair Recent;   Fair Remote;   Fair  Judgement:  Poor  Insight:  Lacking  Psychomotor Activity:  Normal  Concentration:  Concentration: Fair and Attention Span: Fair  Recall:  [employer] of Knowledge:  Fair  Language:  Fair  Akathisia:  No   Handed:  Right  AIMS (if indicated):     Assets:  Communication Skills Desire for Improvement Financial Resources/Insurance Housing Physical Health Resilience Social Support Transportation Vocational/Educational  ADL's:  Intact  Cognition:  WNL  Sleep:  Number of Hours: 7     Have you used any form of tobacco in the last 30 days? (Cigarettes, Smokeless Tobacco, Cigars, and/or Pipes): No  Has this patient used any form of tobacco in the last 30 days? (Cigarettes, Smokeless Tobacco, Cigars, and/or Pipes) Yes, No  Blood Alcohol level:  Lab Results  Component Value Date   ETH <10 10/31/2017   ETH <5 09/47/0962    Metabolic Disorder Labs:  Lab Results  Component Value Date   HGBA1C 5.1 10/31/2017   MPG 99.67 10/31/2017   Lab Results  Component Value Date   PROLACTIN 131.2 (H) 02/21/2016   PROLACTIN 10.7 10/07/2015   Lab Results  Component Value Date   CHOL 214 (H) 10/31/2017   TRIG 229 (H) 10/31/2017   HDL 63 10/31/2017   CHOLHDL 3.4 10/31/2017   VLDL 46 (H) 10/31/2017   LDLCALC 105 (H) 10/31/2017   LDLCALC 107 (H) 07/12/2017    See Psychiatric Specialty Exam and Suicide Risk Assessment completed by Attending Physician prior to discharge.  Discharge destination:  Home  Is patient on multiple antipsychotic therapies at discharge:  No   Has Patient had three or more failed trials of antipsychotic monotherapy by history:  No  Recommended Plan for Multiple Antipsychotic Therapies: NA  Discharge Instructions    Diet - low sodium heart healthy   Complete by:  As directed    Increase activity slowly   Complete by:  As directed      Allergies as of 11/02/2017      Reactions   Diclofenac Sodium Palpitations   Naproxen Itching, Rash, Hives   Olanzapine    Other reaction(s): Other Cross-eyed and hyperventilation   Dust Mite Mixed Allergen Ext [mite (d. Farinae)] Itching   Respiratory illness, and itchy eyes, eye problems   Gluten Meal Other (See Comments)    Abdominal bloating   Nsaids    Other reaction(s): Other (See Comments) Aleve causes hives   Other Itching   Respiratory illness, and itchy eyes, eye problems Respiratory illness, and itchy eyes, eye problems      Medication List    STOP taking these medications   ibuprofen 600 MG  tablet Commonly known as:  ADVIL,MOTRIN   meclizine 12.5 MG tablet Commonly known as:  ANTIVERT   triamcinolone cream 0.1 % Commonly known as:  KENALOG     TAKE these medications     Indication  CALCIUM 600+D PLUS MINERALS 600-400 MG-UNIT Tabs Take by mouth daily.  Indication:  general health   Fish Oil 1000 MG Caps Take by mouth daily.  Indication:  unsure of dose   levothyroxine 75 MCG tablet Commonly known as:  SYNTHROID, LEVOTHROID TAKE 1 TABLET BY MOUTH DAILY  Indication:  Underactive Thyroid   MULTI-VITAMINS Tabs daily.  Indication:  general health   NASACORT ALLERGY 24HR 55 MCG/ACT Aero nasal inhaler Generic drug:  triamcinolone Place 2 sprays into the nose daily.  Indication:  Hayfever   REFRESH OPTIVE MEGA-3 0.5-1-0.5 % Soln Generic drug:  Carboxymeth-Glyc-Polysorb PF Apply 2 drops to eye 2 (two) times daily.  Indication:  dry eye   RISPERDAL 2 MG tablet Generic drug:  risperiDONE Take 1 tablet (2 mg total) by mouth at bedtime.  Indication:  Delusions of Parasitosis        Follow-up recommendations:  Activity:  as tolerated Diet:  low sodium heart healthy Other:  keep follow up appointments  Comments:    Signed: Orson Slick, MD 11/02/2017, 8:12 AM

## 2017-11-01 NOTE — Plan of Care (Signed)
  Problem: Activity: Goal: Will verbalize the importance of balancing activity with adequate rest periods Outcome: Progressing  Patient endorses importance of sleep and rest.

## 2017-11-02 LAB — T4, FREE: FREE T4: 1.31 ng/dL (ref 0.82–1.77)

## 2017-11-02 NOTE — Progress Notes (Signed)
Recreation Therapy Notes  Date: 11/02/2017  Time: 9:30 am  Location: Craft Room  Behavioral response: Appropriate  Intervention Topic: Time Management  Discussion/Intervention:  Group content today was focused on time management. The group defined time management and identified healthy ways to manage time. Individuals expressed how much of the 24 hours they use in a day. Patients expressed how much time they use just for themselves personally. The group expressed how they have managed their time in the past. Individuals participated in the intervention "Managing Life" where they had a chance to see how much of the 24 hours they use and where it goes. Clinical Observations/Feedback:  Patient came to group and explained she normally uses at least 16 hours of her day and 3 hours is just for her.  She explained that time management is important in any relationship. Individual was social with peers and staff while participating in group. Angelyna Henderson LRT/CTRS           Azarius Lambson 11/02/2017 12:32 PM

## 2017-11-02 NOTE — Progress Notes (Signed)
Received Diamond Lucas this AM, she was compliant with her medications. She received her discharge order,the AVS was reviewed and her questions answered. Her personal belongings were returned. She denied all of the psychiatric symptoms and feels safe to be discharge home. She was escorted in the direction of the ED where her car is park.

## 2017-11-02 NOTE — Progress Notes (Signed)
  [facility] Adult Case Management Discharge Plan :  Will you be returning to the same living situation after discharge:  Yes,  Pt will be returning to her home. At discharge, do you have transportation home?: Yes,  Pt has her own transporation. Do you have the ability to pay for your medications: Yes,  Pt has financial means to pay for her medications.  Release of information consent forms completed and in the chart;  Patient's signature needed at discharge.  Patient to Follow up at: Follow-up [facility]. Follow up.   Why:  TBD Contact information: [place] [address] [phone]           Next level of care provider has access to [place] and Suicide Prevention discussed: Yes,  No safety concerns noted.  Have you used any form of tobacco in the last 30 days? (Cigarettes, Smokeless Tobacco, Cigars, and/or Pipes): No  Has patient been referred to the Quitline?: N/A patient is not a smoker  Patient has been referred for addiction treatment: [place], LCSW 11/02/2017, 10:51 AM

## 2017-11-03 LAB — T3: T3, Total: 112 ng/dL (ref 71–180)

## 2017-11-14 ENCOUNTER — Other Ambulatory Visit: Payer: Self-pay | Admitting: Obstetrics and Gynecology

## 2017-11-14 ENCOUNTER — Ambulatory Visit (INDEPENDENT_AMBULATORY_CARE_PROVIDER_SITE_OTHER): Payer: Medicare Other | Admitting: Obstetrics and Gynecology

## 2017-11-14 ENCOUNTER — Other Ambulatory Visit (INDEPENDENT_AMBULATORY_CARE_PROVIDER_SITE_OTHER): Payer: Medicare Other

## 2017-11-14 ENCOUNTER — Encounter: Payer: Self-pay | Admitting: Obstetrics and Gynecology

## 2017-11-14 VITALS — BP 123/74 | HR 77 | Ht 61.0 in | Wt 118.6 lb

## 2017-11-14 DIAGNOSIS — F209 Schizophrenia, unspecified: Secondary | ICD-10-CM | POA: Diagnosis not present

## 2017-11-14 DIAGNOSIS — R102 Pelvic and perineal pain: Secondary | ICD-10-CM

## 2017-11-14 DIAGNOSIS — Z01419 Encounter for gynecological examination (general) (routine) without abnormal findings: Secondary | ICD-10-CM | POA: Diagnosis not present

## 2017-11-14 DIAGNOSIS — F458 Other somatoform disorders: Secondary | ICD-10-CM | POA: Diagnosis not present

## 2017-11-14 DIAGNOSIS — F4323 Adjustment disorder with mixed anxiety and depressed mood: Secondary | ICD-10-CM | POA: Diagnosis not present

## 2017-11-14 NOTE — Progress Notes (Signed)
Pt stated that she went to the ED and they stated that she had bats in her uterus and needed to have abd surgery. Pt stated that she present today to speak with the doctor about having a hysterotomy to remove the bats.

## 2017-11-14 NOTE — Progress Notes (Signed)
    GYNECOLOGY PROGRESS NOTE  Subjective:    Patient ID: Diamond Lucas, female    DOB: [birth year], 57 y.o.   MRN: [id number]  HPI  Patient is a 57 y.o. G0P0000 postmenopausal female who presents for follow up after emergency room visit 2 months ago.  Patient has a h/o schizophrenia, hypothyroidism,  delusions of pregnancy, bipolar disorder.  Today she notes that she is pregnant with twins, as well as bats (due to an infestation in her home) and she believes that one of the bats bit one of the babies on the arm. She requests surgical intervention (C-section/hysterotomy) in order to get the bats out and to deliver the babies so that they have a chance to live.Notes that the Emergency Room told her that this would be the best thing for her.  Notes that it is causing her pelvic discomfort and sometimes has intermittent sharp pains. She denies postmenopausal bleeding.   On review of patient's chart, she was seen by Psychiatry during Emergency Room visit (had involuntary commitment), with no new recommendations, patient to continue her Risperdal 1 mg nightly.   The following portions of the patient's history were reviewed and updated as appropriate: allergies, current medications, past family history, past medical history, past social history, past surgical history and problem list.  Review of Systems Pertinent items noted in HPI and remainder of comprehensive ROS otherwise negative.   Objective:   Blood pressure 123/74, pulse 77, height 5\' 1"  (1.549 m), weight 118 lb 9.6 oz (53.8 kg), last menstrual period 04/10/2015. General appearance: alert and no distress Abdomen: soft, non-tender; bowel sounds normal; no masses,  no organomegaly Pelvic: deferred    US Transvaginal Non-OB ULTRASOUND REPORT  Location: [facility] Date of Service:  11/14/2017  Indications:  Findings:  The uterus measures 5.4 x 3.2 x 2.1 cm. Echo texture is grossly homogeneous without evidence of focal  masses.  The Endometrium is unable to be delineated.  Right Ovary measures 1.6 x 1.4 x 1.0 cm. It is normal in appearance. Left Ovary measures 1.6 x 1.4 x 1.1 cm. It is normal appearance. Survey of the adnexa demonstrates no adnexal masses. There is no free fluid in the cul de sac.  Impression: 1. Postmenopausal anteverted uterus appears of normal size and contour. 2. Bilateral ovaries appear WNL.  Recommendations: 1.Clinical correlation with the patient's History and Physical Exam.  Dario Ave, RDMS  I have reviewed this study and agree with documented findings.   [name], MD [facility]     Assessment:   Pelvic pain Delusions of pregnancy Schizophrenia  Plan:   Unclear if patient's pelvic pain is real, or if this is part of her delusion associated with the pregnancy. Ultrasound performed to r/o any potential causes of pelvic pain, was normal today. Informed patient of normal ultrasound (including no evidence of bats or babies) however patient notes that the bats and babies "must have shrunk and are too tiny to see right now on ultrasound". She continues to desire a hysterotomy/C-section.  I will discuss her care further with her psychiatrist as she continues to request surgical interventions for her delusion.     [name], MD [facility]

## 2017-11-16 ENCOUNTER — Encounter: Payer: Self-pay | Admitting: Physician Assistant

## 2017-11-22 ENCOUNTER — Encounter: Payer: Self-pay | Admitting: Physician Assistant

## 2017-12-14 ENCOUNTER — Telehealth (INDEPENDENT_AMBULATORY_CARE_PROVIDER_SITE_OTHER): Payer: Self-pay

## 2017-12-14 NOTE — Telephone Encounter (Signed)
Patient called and asked about the appointment that is scheduled for her tomorrow. She wants to know if she has to fast for the Ultrasound?  She stated that if she has to fast that this appointment will have to changed to another day. Please call the patient to answer her question.

## 2017-12-15 ENCOUNTER — Ambulatory Visit (INDEPENDENT_AMBULATORY_CARE_PROVIDER_SITE_OTHER): Payer: Medicare Other | Admitting: Nurse Practitioner

## 2017-12-15 ENCOUNTER — Encounter (INDEPENDENT_AMBULATORY_CARE_PROVIDER_SITE_OTHER): Payer: Medicare Other

## 2017-12-15 ENCOUNTER — Encounter

## 2017-12-19 ENCOUNTER — Encounter: Payer: Self-pay | Admitting: Physician Assistant

## 2017-12-20 DIAGNOSIS — F4323 Adjustment disorder with mixed anxiety and depressed mood: Secondary | ICD-10-CM | POA: Diagnosis not present

## 2017-12-26 ENCOUNTER — Encounter: Payer: Self-pay | Admitting: Physician Assistant

## 2017-12-26 ENCOUNTER — Ambulatory Visit (INDEPENDENT_AMBULATORY_CARE_PROVIDER_SITE_OTHER): Payer: Medicare Other | Admitting: Physician Assistant

## 2017-12-26 VITALS — BP 122/80 | HR 66 | Temp 98.1°F | Wt 118.0 lb

## 2017-12-26 DIAGNOSIS — Z1211 Encounter for screening for malignant neoplasm of colon: Secondary | ICD-10-CM | POA: Diagnosis not present

## 2017-12-26 DIAGNOSIS — F22 Delusional disorders: Secondary | ICD-10-CM | POA: Diagnosis not present

## 2017-12-26 DIAGNOSIS — R112 Nausea with vomiting, unspecified: Secondary | ICD-10-CM

## 2017-12-26 DIAGNOSIS — K649 Unspecified hemorrhoids: Secondary | ICD-10-CM

## 2017-12-26 MED ORDER — ONDANSETRON HCL 4 MG PO TABS: 4.0000 mg | ORAL_TABLET | Freq: Three times a day (TID) | ORAL | 0 refills | Status: DC | PRN

## 2017-12-26 MED ORDER — HYDROCORTISONE ACETATE 25 MG RE SUPP: 25.0000 mg | Freq: Two times a day (BID) | RECTAL | 0 refills | Status: DC

## 2017-12-26 NOTE — Patient Instructions (Signed)
Hemorrhoids Hemorrhoids are swollen veins in and around the rectum or anus. There are two types of hemorrhoids:  Internal hemorrhoids. These occur in the veins that are just inside the rectum. They may poke through to the outside and become irritated and painful.  External hemorrhoids. These occur in the veins that are outside of the anus and can be felt as a painful swelling or hard lump near the anus.  Most hemorrhoids do not cause serious problems, and they can be managed with home treatments such as diet and lifestyle changes. If home treatments do not help your symptoms, procedures can be done to shrink or remove the hemorrhoids. What are the causes? This condition is caused by increased pressure in the anal area. This pressure may result from various things, including:  Constipation.  Straining to have a bowel movement.  Diarrhea.  Pregnancy.  Obesity.  Sitting for long periods of time.  Heavy lifting or other activity that causes you to strain.  Anal sex.  What are the signs or symptoms? Symptoms of this condition include:  Pain.  Anal itching or irritation.  Rectal bleeding.  Leakage of stool (feces).  Anal swelling.  One or more lumps around the anus.  How is this diagnosed? This condition can often be diagnosed through a visual exam. Other exams or tests may also be done, such as:  Examination of the rectal area with a gloved hand (digital rectal exam).  Examination of the anal canal using a small tube (anoscope).  A blood test, if you have lost a significant amount of blood.  A test to look inside the colon (sigmoidoscopy or colonoscopy).  How is this treated? This condition can usually be treated at home. However, various procedures may be done if dietary changes, lifestyle changes, and other home treatments do not help your symptoms. These procedures can help make the hemorrhoids smaller or remove them completely. Some of these procedures involve  surgery, and others do not. Common procedures include:  Rubber band ligation. Rubber bands are placed at the base of the hemorrhoids to cut off the blood supply to them.  Sclerotherapy. Medicine is injected into the hemorrhoids to shrink them.  Infrared coagulation. A type of light energy is used to get rid of the hemorrhoids.  Hemorrhoidectomy surgery. The hemorrhoids are surgically removed, and the veins that supply them are tied off.  Stapled hemorrhoidopexy surgery. A circular stapling device is used to remove the hemorrhoids and use staples to cut off the blood supply to them.  Follow these instructions at home: Eating and drinking  Eat foods that have a lot of fiber in them, such as whole grains, beans, nuts, fruits, and vegetables. Ask your health care provider about taking products that have added fiber (fiber supplements).  Drink enough fluid to keep your urine clear or pale yellow. Managing pain and swelling  Take warm sitz baths for 20 minutes, 3-4 times a day to ease pain and discomfort.  If directed, apply ice to the affected area. Using ice packs between sitz baths may be helpful. ? Put ice in a plastic bag. ? Place a towel between your skin and the bag. ? Leave the ice on for 20 minutes, 2-3 times a day. General instructions  Take over-the-counter and prescription medicines only as told by your health care provider.  Use medicated creams or suppositories as told.  Exercise regularly.  Go to the bathroom when you have the urge to have a bowel movement. Do not wait.    Avoid straining to have bowel movements.  Keep the anal area dry and clean. Use wet toilet paper or moist towelettes after a bowel movement.  Do not sit on the toilet for long periods of time. This increases blood pooling and pain. Contact a health care provider if:  You have increasing pain and swelling that are not controlled by treatment or medicine.  You have uncontrolled bleeding.  You  have difficulty having a bowel movement, or you are unable to have a bowel movement.  You have pain or inflammation outside the area of the hemorrhoids. This information is not intended to replace advice given to you by your health care provider. Make sure you discuss any questions you have with your health care provider. Document Released: 03/11/2000 Document Revised: 08/12/2015 Document Reviewed: 11/26/2014 Elsevier Interactive Patient Education  2018 [employer]  

## 2017-12-26 NOTE — Progress Notes (Signed)
Patient: Diamond Lucas Female    DOB: Dec 14, 1960   57 y.o.   MRN: [id number] Visit Date: 12/26/2017  Today's Provider: Mar Daring, PA-C   Chief Complaint  Patient presents with  . Hemorrhoids   Subjective:    HPI Patient here today with c/o possible hemorrhoids. Reports that she notice them last week. Reports that at times they are painful. She reports some rectal bleeding, last time she notice some blood was Sunday morning. She reports that she has done some prunes and oranges and it helped. She reports that she is nauseous and vomiting in the mornings.    Allergies  Allergen Reactions  . Diclofenac Sodium Palpitations  . Naproxen Itching, Rash and Hives  . Olanzapine     Other reaction(s): Other Cross-eyed and hyperventilation  . Dust Mite Mixed Allergen Ext [Mite (D. Farinae)] Itching    Respiratory illness, and itchy eyes, eye problems  . Gluten Meal Other (See Comments)    Abdominal bloating  . Nsaids     Other reaction(s): Other (See Comments) Aleve causes hives  . Other Itching    Respiratory illness, and itchy eyes, eye problems Respiratory illness, and itchy eyes, eye problems     Current Outpatient Medications:  .  Calcium Carbonate-Vit D-Min (CALCIUM 600+D PLUS MINERALS) 600-400 MG-UNIT TABS, Take by mouth daily. , Disp: , Rfl:  .  Carboxymeth-Glyc-Polysorb PF (REFRESH OPTIVE MEGA-3) 0.5-1-0.5 % SOLN, Apply 2 drops to eye 2 (two) times daily., Disp: , Rfl:  .  levothyroxine (SYNTHROID, LEVOTHROID) 75 MCG tablet, TAKE 1 TABLET BY MOUTH DAILY, Disp: , Rfl:  .  Multiple Vitamin (MULTI-VITAMINS) TABS, daily. , Disp: , Rfl:  .  Omega-3 Fatty Acids (FISH OIL) 1000 MG CAPS, Take by mouth daily., Disp: , Rfl:  .  risperiDONE (RISPERDAL) 1 MG tablet, Take 1 mg by mouth at bedtime., Disp: , Rfl:  .  triamcinolone (NASACORT ALLERGY 24HR) 55 MCG/ACT AERO nasal inhaler, Place 2 sprays into the nose daily., Disp: , Rfl:   Review of Systems    Constitutional: Negative.   Respiratory: Negative.   Cardiovascular: Negative for chest pain, palpitations and leg swelling.  Gastrointestinal: Positive for abdominal distention, abdominal pain, anal bleeding, blood in stool, nausea, rectal pain and vomiting.  Genitourinary: Negative.   Neurological: Negative.     Social History   Tobacco Use  . Smoking status: Never Smoker  . Smokeless tobacco: Never Used  Substance Use Topics  . Alcohol use: No   Objective:   BP 122/80 (BP Location: Left Arm, Patient Position: Sitting, Cuff Size: Normal)   Pulse 66   Temp 98.1 F (36.7 C) (Oral)   Wt 118 lb (53.5 kg)   LMP 04/10/2015 Comment: Only spotting  HC 16" (40.6 cm)   BMI 22.30 kg/m  Vitals:   12/26/17 1431  BP: 122/80  Pulse: 66  Temp: 98.1 F (36.7 C)  TempSrc: Oral  Weight: 118 lb (53.5 kg)  HC: 16" (40.6 cm)     Physical Exam  Constitutional: She appears well-developed and well-nourished. No distress.  Neck: Normal range of motion. Neck supple.  Cardiovascular: Normal rate, regular rhythm and normal heart sounds. Exam reveals no gallop and no friction rub.  No murmur heard. Pulmonary/Chest: Effort normal and breath sounds normal. No respiratory distress. She has no wheezes. She has no rales.  Abdominal: Soft. Bowel sounds are normal. She exhibits distension. She exhibits no mass. There is generalized tenderness. There is no rebound  and no guarding.  Skin: She is not diaphoretic.  Vitals reviewed.      Assessment & Plan:     1. Hemorrhoids, unspecified hemorrhoid type Patient reports symptoms have resolved with preparation H and prunes. Advised to call if symptoms recur. Anusol suppository sent in for her for future flares. Referral also made to GI for colon cancer screening as there is no colon cancer screening documented. She does report having a colonoscopy in 2011 with Dr. Donnajean Lopes with [facility] for rectal bleeding and was found to have internal bleeding  hemorrhoids. She is to call if symptoms worsen.  - hydrocortisone (ANUSOL-HC) 25 MG suppository; Place 1 suppository (25 mg total) rectally 2 (two) times daily.  Dispense: 12 suppository; Refill: 0  2. Nausea and vomiting, intractability of vomiting not specified, unspecified vomiting type Zofran for nausea prn. Patient declines any imaging as she "does not want the radiation." GI referral made for Colon cancer screen.  - ondansetron (ZOFRAN) 4 MG tablet; Take 1 tablet (4 mg total) by mouth every 8 (eight) hours as needed.  Dispense: 20 tablet; Refill: 0  3. Colon cancer screening No family history. No polyps known to patient. Last colonoscopy was in 2011 due to rectal bleeding. Internal hemorrhoids were cause.  - Ambulatory referral to Gastroenterology  4. 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 Had rational thought. Did not discuss any delusional source as cause of her pain/hemorrhoids.        Mar Daring, PA-C  [facility]

## 2017-12-29 ENCOUNTER — Telehealth: Payer: Self-pay | Admitting: Gastroenterology

## 2017-12-29 NOTE — Telephone Encounter (Signed)
Patient called and would like to schedule her colonoscopy with a female physician.

## 2018-01-02 ENCOUNTER — Telehealth: Payer: Self-pay

## 2018-01-02 NOTE — Telephone Encounter (Signed)
LVM for pt returning call to schedule colonoscopy.  Thanks Macel Yearsley 

## 2018-01-08 ENCOUNTER — Other Ambulatory Visit: Payer: Self-pay

## 2018-01-08 DIAGNOSIS — Z1211 Encounter for screening for malignant neoplasm of colon: Secondary | ICD-10-CM

## 2018-01-08 MED ORDER — NA SULFATE-K SULFATE-MG SULF 17.5-3.13-1.6 GM/177ML PO SOLN: 1.0000 | Freq: Once | ORAL | 0 refills | Status: AC

## 2018-01-09 ENCOUNTER — Other Ambulatory Visit: Payer: Self-pay

## 2018-01-09 ENCOUNTER — Encounter: Payer: Self-pay | Admitting: Gastroenterology

## 2018-01-09 ENCOUNTER — Ambulatory Visit (INDEPENDENT_AMBULATORY_CARE_PROVIDER_SITE_OTHER): Payer: Medicare Other | Admitting: Gastroenterology

## 2018-01-09 VITALS — BP 117/74 | HR 66 | Ht 61.0 in | Wt 115.4 lb

## 2018-01-09 DIAGNOSIS — K625 Hemorrhage of anus and rectum: Secondary | ICD-10-CM | POA: Diagnosis not present

## 2018-01-09 NOTE — Progress Notes (Signed)
Diamond Lucas [address]  [place], [place] [address]  Main: [phone]  Fax: [phone]   Gastroenterology Consultation  Referring Provider:     Florian Buff* Primary Care Physician:  Mar Daring, PA-C Primary Gastroenterologist:  Dr. Vonda Lucas Reason for Consultation:     Screening colonoscopy        HPI:    Chief Complaint  Patient presents with  . New Patient (Initial Visit)    referral by Dr. Lenna Sciara. [place]-screening colonoscopy? feels full, wants to only do lower portion of colonoscopy if necessary    Diamond Lucas is a 57 y.o. y/o female referred for consultation & management  by Dr. Marlyn Corporal, Clearnce Sorrel, PA-C.  Patient was referred for screening colonoscopy.  She reports history of a colonoscopy 9 years ago and states it was done at the time as she was having problems with bleeding and she underwent sigmoidoscopy at the time and was diagnosed with hemorrhoids.  She uses Preparation H cream as needed, and Metamucil daily and has soft stools daily.  Has never had a full colonoscopy.  Does report intermittent bright red blood per rectum and states she feels her hemorrhoids are back and would like this evaluated as well.  No prior hemorrhoidectomy.  No family history of colon cancer.  No weight loss, no melena, no abdominal pain, no heartburn, no dysphagia.  She was given Anusol suppositories by her primary care provider and she states she has not started taking this.  Past Medical History:  Diagnosis Date  . Allergy   . Arrhythmia   . History of chicken pox   . History of measles   . History of syncope   . Osteoarthritis   . Rosacea   . Schizophrenia ([place])   . Thyroid disease     Past Surgical History:  Procedure Laterality Date  . FOOT SURGERY Right   . THYROIDECTOMY      Prior to Admission medications   Medication Sig Start Date End Date Taking? Authorizing Provider  Calcium Carbonate-Vit D-Min  (CALCIUM 600+D PLUS MINERALS) 600-400 MG-UNIT TABS Take by mouth daily.    Yes [provider]  Carboxymeth-Glyc-Polysorb PF (REFRESH OPTIVE MEGA-3) 0.5-1-0.5 % SOLN Apply 2 drops to eye 2 (two) times daily.   Yes [provider]  hydrocortisone (ANUSOL-HC) 25 MG suppository Place 1 suppository (25 mg total) rectally 2 (two) times daily. 12/26/17  Yes Mar Daring, PA-C  levothyroxine (SYNTHROID, LEVOTHROID) 75 MCG tablet TAKE 1 TABLET BY MOUTH DAILY 02/15/16  Yes [provider]  Multiple Vitamin (MULTI-VITAMINS) TABS daily.    Yes [provider]  Omega-3 Fatty Acids (FISH OIL) 1000 MG CAPS Take by mouth daily.   Yes [provider]  risperiDONE (RISPERDAL) 1 MG tablet Take 1 mg by mouth at bedtime.   Yes [provider]  triamcinolone (NASACORT ALLERGY 24HR) 55 MCG/ACT AERO nasal inhaler Place 2 sprays into the nose daily.   Yes [provider]  ondansetron (ZOFRAN) 4 MG tablet Take 1 tablet (4 mg total) by mouth every 8 (eight) hours as needed. Patient not taking: Reported on 01/09/2018 12/26/17   Mar Daring, PA-C    Family History  Problem Relation Age of Onset  . Depression Mother   . Cataracts Mother   . Stroke Mother        mini  . Cancer Other   . Heart failure Other   . Heart disease Other   .  Stroke Other   . Diabetes Other   . Kidney cancer Father   . Anxiety disorder Brother      Social History   Tobacco Use  . Smoking status: Never Smoker  . Smokeless tobacco: Never Used  Substance Use Topics  . Alcohol use: No  . Drug use: No    Allergies as of 01/09/2018 - Review Complete 01/09/2018  Allergen Reaction Noted  . Diclofenac sodium Palpitations 11/14/2014  . Naproxen Itching, Rash, and Hives 11/14/2014  . Olanzapine  07/21/2016  . Dust mite mixed allergen ext [mite (d. farinae)] Itching 12/03/2014  . Gluten meal Other (See Comments) 04/05/2017  . Nsaids  11/14/2014  . Other Itching  12/03/2014    Review of Systems:    All systems reviewed and negative except where noted in HPI.   Physical Exam:  BP 117/74   Pulse 66   Ht 5\' 1"  (1.549 m)   Wt 115 lb 6.4 oz (52.3 kg)   LMP 04/10/2015 Comment: Only spotting  BMI 21.80 kg/m  Patient's last menstrual period was 04/10/2015. Psych:  Alert and cooperative. Normal mood and affect. General:   Alert,  Well-developed, well-nourished, pleasant and cooperative in NAD Head:  Normocephalic and atraumatic. Eyes:  Sclera clear, no icterus.   Conjunctiva pink. Ears:  Normal auditory acuity. Nose:  No deformity, discharge, or lesions. Mouth:  No deformity or lesions,oropharynx pink & moist. Neck:  Supple; no masses or thyromegaly. Abdomen:  Normal bowel sounds.  No bruits.  Soft, non-tender and non-distended without masses, hepatosplenomegaly or hernias noted.  No guarding or rebound tenderness.    Msk:  Symmetrical without gross deformities. Good, equal movement & strength bilaterally. Pulses:  Normal pulses noted. Extremities:  No clubbing or edema.  No cyanosis. Neurologic:  Alert and oriented x3;  grossly normal neurologically. Skin:  Intact without significant lesions or rashes. No jaundice. Lymph Nodes:  No significant cervical adenopathy. Psych:  Alert and cooperative. Normal mood and affect.   Labs: CBC    Component Value Date/Time   WBC 8.1 10/31/2017 1355   RBC 4.66 10/31/2017 1355   HGB 14.8 10/31/2017 1355   HGB 14.0 07/12/2017 1607   HCT 42.8 10/31/2017 1355   HCT 42.2 07/12/2017 1607   PLT 236 10/31/2017 1355   PLT 231 07/12/2017 1607   MCV 91.8 10/31/2017 1355   MCV 94 07/12/2017 1607   MCH 31.8 10/31/2017 1355   MCHC 34.6 10/31/2017 1355   RDW 13.0 10/31/2017 1355   RDW 13.2 07/12/2017 1607   LYMPHSABS 2.1 10/31/2017 1355   LYMPHSABS 1.8 07/12/2017 1607   MONOABS 0.6 10/31/2017 1355   EOSABS 0.1 10/31/2017 1355   EOSABS 0.1 07/12/2017 1607   BASOSABS 0.0 10/31/2017 1355   BASOSABS 0.0  07/12/2017 1607   CMP     Component Value Date/Time   NA 140 10/31/2017 1355   NA 142 07/12/2017 1607   K 3.8 10/31/2017 1355   CL 102 10/31/2017 1355   CO2 30 10/31/2017 1355   GLUCOSE 104 (H) 10/31/2017 1355   BUN 16 10/31/2017 1355   BUN 13 07/12/2017 1607   CREATININE 0.89 10/31/2017 1355   CALCIUM 9.0 10/31/2017 1355   PROT 7.3 10/31/2017 1355   PROT 7.1 07/12/2017 1607   ALBUMIN 4.4 10/31/2017 1355   ALBUMIN 4.4 07/12/2017 1607   AST 26 10/31/2017 1355   ALT 18 10/31/2017 1355   ALKPHOS 84 10/31/2017 1355   BILITOT 0.4 10/31/2017 1355   BILITOT 0.4 07/12/2017  [place] 10/31/2017 1355   GFRAA >60 10/31/2017 1355    Imaging Studies: No results found.  Assessment and Plan:   Diamond Lucas is a 57 y.o. y/o female has been referred for screening colonoscopy  Risks and benefits of screening colonoscopy discussed in detail with the patient She is asking if we can just do a sigmoidoscopy and set up a screening colonoscopy as she would like her hemorrhoids evaluated  I have discussed that a sigmoidoscopy is not allowing for evaluation of the rest of her colon, and will not help prevent colon cancer that may occur from the right side of her colon.  Therefore sigmoidoscopy is not the best next step for a screening exam.  Despite her intermittent bright red blood per rectum and her hemoglobin is reassuring, therefore intermittent bright red blood per rectum and previous history of hemorrhoids, all consistent with her intermittent bright blood per rectum being from underlying hemorrhoids.  High-fiber diet encouraged, patient asked to titrate Metamucil or MiraLAX with goal of 1-2 soft problems daily and she verbalized understanding.  However, I have also discussed that colon malignancy can also cause intermittent red blood per rectum in the way to evaluate for this and rule it out as a colonoscopy.  She understands this and would not like to schedule colonoscopy  at this time and understands the risk of missing underlying malignancy if colonoscopy is not done.  I have discussed that if she would not like a colonoscopy for screening, she can discuss alternative screening methods such as Cologuard testing with her primary care provider and she states she will think about doing colonoscopy or following up with her primary care about a Cologuard test.  I have also discussed that if Cologuard testing or any other testing is positive for that matter, the colonoscopy will then become a diagnostic study instead of a preventative screening test.  As far as her hemorrhoids are concerned, we also discussed that doing a colonoscopy to see for internal hemorrhoids are large and if they need banding or if there is small and suppositories can suffice.  Rectal exam does not allow for evaluation of internal hemorrhoids. Alternatively, she would not like a colonoscopy we have also discussed that we can refer her to Dr. Marius Ditch as she is able to do an office procedures for evaluation of internal hemorrhoids and band them if they are large, but this will not take away the need for screening colonoscopies.  She would like to think about the above and let us know what she would like to do  She understands the risks of developing colon malignancy if screening colonoscopies are not time and will let us know when she is ready.  Follow-up with primary care provider as scheduled  Follow-up in our clinic in 1 to 2 months as well  Dr Diamond Lucas  Speech recognition software was used to dictate the above note.

## 2018-01-15 DIAGNOSIS — F209 Schizophrenia, unspecified: Secondary | ICD-10-CM | POA: Diagnosis not present

## 2018-01-16 ENCOUNTER — Encounter: Payer: Self-pay | Admitting: Physician Assistant

## 2018-01-18 ENCOUNTER — Ambulatory Visit: Admit: 2018-01-18 | Payer: Medicare Other | Admitting: Gastroenterology

## 2018-01-18 SURGERY — COLONOSCOPY WITH PROPOFOL
Anesthesia: General

## 2018-01-22 ENCOUNTER — Other Ambulatory Visit: Payer: Self-pay

## 2018-01-22 DIAGNOSIS — Z1211 Encounter for screening for malignant neoplasm of colon: Secondary | ICD-10-CM

## 2018-01-29 DIAGNOSIS — F209 Schizophrenia, unspecified: Secondary | ICD-10-CM | POA: Diagnosis not present

## 2018-01-30 ENCOUNTER — Encounter: Payer: Self-pay | Admitting: Physician Assistant

## 2018-01-31 ENCOUNTER — Ambulatory Visit: Payer: Self-pay

## 2018-02-07 ENCOUNTER — Encounter: Payer: Self-pay | Admitting: Anesthesiology

## 2018-02-08 ENCOUNTER — Ambulatory Visit: Payer: Medicare Other | Admitting: Anesthesiology

## 2018-02-08 ENCOUNTER — Encounter: Payer: Self-pay | Admitting: *Deleted

## 2018-02-08 ENCOUNTER — Encounter
Admission: RE | Disposition: A | Discharge status: Home / Self Care | Payer: Self-pay | Source: Ambulatory Visit | Attending: Gastroenterology

## 2018-02-08 ENCOUNTER — Ambulatory Visit
Admission: RE | Admit: 2018-02-08 | Discharge: 2018-02-08 | Disposition: A | Discharge status: Home / Self Care | Payer: Medicare Other | Source: Ambulatory Visit | Attending: Gastroenterology | Admitting: Gastroenterology

## 2018-02-08 DIAGNOSIS — K635 Polyp of colon: Secondary | ICD-10-CM | POA: Diagnosis not present

## 2018-02-08 DIAGNOSIS — I10 Essential (primary) hypertension: Secondary | ICD-10-CM | POA: Insufficient documentation

## 2018-02-08 DIAGNOSIS — D120 Benign neoplasm of cecum: Secondary | ICD-10-CM

## 2018-02-08 DIAGNOSIS — D126 Benign neoplasm of colon, unspecified: Secondary | ICD-10-CM | POA: Diagnosis not present

## 2018-02-08 DIAGNOSIS — Z791 Long term (current) use of non-steroidal anti-inflammatories (NSAID): Secondary | ICD-10-CM | POA: Diagnosis not present

## 2018-02-08 DIAGNOSIS — D125 Benign neoplasm of sigmoid colon: Secondary | ICD-10-CM

## 2018-02-08 DIAGNOSIS — E039 Hypothyroidism, unspecified: Secondary | ICD-10-CM | POA: Diagnosis not present

## 2018-02-08 DIAGNOSIS — Z79899 Other long term (current) drug therapy: Secondary | ICD-10-CM | POA: Diagnosis not present

## 2018-02-08 DIAGNOSIS — K6289 Other specified diseases of anus and rectum: Secondary | ICD-10-CM | POA: Insufficient documentation

## 2018-02-08 DIAGNOSIS — Z1211 Encounter for screening for malignant neoplasm of colon: Secondary | ICD-10-CM

## 2018-02-08 DIAGNOSIS — F209 Schizophrenia, unspecified: Secondary | ICD-10-CM | POA: Insufficient documentation

## 2018-02-08 HISTORY — PX: COLONOSCOPY WITH PROPOFOL: SHX5780

## 2018-02-08 LAB — POCT PREGNANCY, URINE: Preg Test, Ur: NEGATIVE

## 2018-02-08 SURGERY — COLONOSCOPY WITH PROPOFOL
Anesthesia: General

## 2018-02-08 MED ORDER — LIDOCAINE HCL (CARDIAC) PF 100 MG/5ML IV SOSY
PREFILLED_SYRINGE | INTRAVENOUS | Status: DC | PRN
  Administered 2018-02-08: 60 mg via INTRAVENOUS

## 2018-02-08 MED ORDER — MIDAZOLAM HCL 2 MG/2ML IJ SOLN
INTRAMUSCULAR | Status: DC | PRN
  Administered 2018-02-08: 2 mg via INTRAVENOUS

## 2018-02-08 MED ORDER — PROPOFOL 500 MG/50ML IV EMUL
INTRAVENOUS | Status: AC
  Filled 2018-02-08: qty 50

## 2018-02-08 MED ORDER — LIDOCAINE HCL (PF) 2 % IJ SOLN
INTRAMUSCULAR | Status: AC
  Filled 2018-02-08: qty 10

## 2018-02-08 MED ORDER — PROPOFOL 500 MG/50ML IV EMUL
INTRAVENOUS | Status: DC | PRN
  Administered 2018-02-08: 130 ug/kg/min via INTRAVENOUS

## 2018-02-08 MED ORDER — PROPOFOL 10 MG/ML IV BOLUS
INTRAVENOUS | Status: DC | PRN
  Administered 2018-02-08: 70 mg via INTRAVENOUS
  Administered 2018-02-08: 20 mg via INTRAVENOUS

## 2018-02-08 MED ORDER — MIDAZOLAM HCL 2 MG/2ML IJ SOLN
INTRAMUSCULAR | Status: AC
  Filled 2018-02-08: qty 2

## 2018-02-08 MED ORDER — SODIUM CHLORIDE 0.9 % IV SOLN
INTRAVENOUS | Status: DC
  Administered 2018-02-08: 1000 mL via INTRAVENOUS

## 2018-02-08 NOTE — H&P (Signed)
Diamond Antigua, MD [address], [place], [place], [place], [address] [address], [place], [place], [place], [address] Phone: [phone]  Fax: [phone]  Primary Care Physician:  Mar Daring, PA-C   Pre-Procedure History & Physical: HPI:  Diamond Lucas is a 57 y.o. female is here for a colonoscopy.   Past Medical History:  Diagnosis Date  . Allergy   . Arrhythmia   . History of chicken pox   . History of measles   . History of syncope   . Osteoarthritis   . Rosacea   . Schizophrenia ([place])   . Thyroid disease     Past Surgical History:  Procedure Laterality Date  . FOOT SURGERY Right   . THYROIDECTOMY      Prior to Admission medications   Medication Sig Start Date End Date Taking? Authorizing Provider  Calcium Carbonate-Vit D-Min (CALCIUM 600+D PLUS MINERALS) 600-400 MG-UNIT TABS Take by mouth daily.    Yes [provider]  Carboxymeth-Glyc-Polysorb PF (REFRESH OPTIVE MEGA-3) 0.5-1-0.5 % SOLN Apply 2 drops to eye 2 (two) times daily.   Yes [provider]  hydrocortisone (ANUSOL-HC) 25 MG suppository Place 1 suppository (25 mg total) rectally 2 (two) times daily. 12/26/17  Yes Mar Daring, PA-C  levothyroxine (SYNTHROID, LEVOTHROID) 75 MCG tablet TAKE 1 TABLET BY MOUTH DAILY 02/15/16  Yes [provider]  Multiple Vitamin (MULTI-VITAMINS) TABS daily.    Yes [provider]  Omega-3 Fatty Acids (FISH OIL) 1000 MG CAPS Take by mouth daily.   Yes [provider]  risperiDONE (RISPERDAL) 1 MG tablet Take 1 mg by mouth at bedtime.   Yes [provider]  triamcinolone (NASACORT ALLERGY 24HR) 55 MCG/ACT AERO nasal inhaler Place 2 sprays into the nose daily.   Yes [provider]  ondansetron (ZOFRAN) 4 MG tablet Take 1 tablet (4 mg total) by mouth every 8 (eight) hours as needed. Patient not taking: Reported on 01/09/2018 12/26/17   Mar Daring, PA-C    Allergies as of  01/22/2018 - Review Complete 01/09/2018  Allergen Reaction Noted  . Diclofenac sodium Palpitations 11/14/2014  . Naproxen Itching, Rash, and Hives 11/14/2014  . Olanzapine  07/21/2016  . Dust mite mixed allergen ext [mite (d. farinae)] Itching 12/03/2014  . Gluten meal Other (See Comments) 04/05/2017  . Nsaids  11/14/2014  . Other Itching 12/03/2014    Family History  Problem Relation Age of Onset  . Depression Mother   . Cataracts Mother   . Stroke Mother        mini  . Cancer Other   . Heart failure Other   . Heart disease Other   . Stroke Other   . Diabetes Other   . Kidney cancer Father   . Anxiety disorder Brother     Social History   Socioeconomic History  . Marital status: Single    Spouse name: Not on file  . Number of children: 0  . Years of education: Not on file  . Highest education level: Bachelor's degree (e.g., BA, AB, BS)  Occupational History  . 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 Financial resource strain: Not hard at all  . Food insecurity:    Worry: Never true    Inability: Never true  . Transportation needs:    Medical: No    Non-medical: No  Tobacco Use  . Smoking status: Never Smoker  .  Smokeless tobacco: Never Used  Substance and Sexual Activity  . Alcohol use: No  . Drug use: No  . Sexual activity: Not Currently    Birth control/protection: Post-menopausal, Abstinence  Lifestyle  . Physical activity:    Days per week: Not on file    Minutes per session: Not on file  . Stress: Only a little  Relationships  . Social connections:    Talks on phone: Not on file    Gets together: Not on file    Attends religious service: Not on file    Active member of club or organization: Not on file    Attends meetings of clubs or organizations: Not on file    Relationship status: Not on file  . Intimate partner violence:    Fear of current or ex partner: Not on file    Emotionally  abused: Not on file    Physically abused: Not on file    Forced sexual activity: Not on file  Other Topics Concern  . Not on file  Social History Narrative  . Not on file    Review of Systems: See HPI, otherwise negative ROS  Physical Exam: BP 139/84   Pulse 74   Temp (!) 97.2 F (36.2 C) (Tympanic)   Resp 16   Ht 5' 0.5" (1.537 m)   Wt 52.6 kg   LMP 04/10/2015 Comment: Only spotting  SpO2 99%   BMI 22.28 kg/m  General:   Alert,  pleasant and cooperative in NAD Head:  Normocephalic and atraumatic. Neck:  Supple; no masses or thyromegaly. Lungs:  Clear throughout to auscultation, normal respiratory effort.    Heart:  +S1, +S2, Regular rate and rhythm, No edema. Abdomen:  Soft, nontender and nondistended. Normal bowel sounds, without guarding, and without rebound.   Neurologic:  Alert and  oriented x4;  grossly normal neurologically.  Impression/Plan: Diamond Lucas is here for a colonoscopy to be performed for average risk screening.  Risks, benefits, limitations, and alternatives regarding  colonoscopy have been reviewed with the patient.  Questions have been answered.  All parties agreeable.   Virgel Manifold, MD  02/08/2018, 8:34 AM

## 2018-02-08 NOTE — Anesthesia Preprocedure Evaluation (Signed)
Anesthesia Evaluation  Patient identified by MRN, date of birth, ID band Patient awake    Reviewed: Allergy & Precautions, NPO status , Patient's Chart, lab work & pertinent test results, reviewed documented beta blocker date and time   Airway Mallampati: II  TM Distance: >3 FB     Dental  (+) Chipped   Pulmonary           Cardiovascular hypertension,      Neuro/Psych PSYCHIATRIC DISORDERS Bipolar Disorder Schizophrenia    GI/Hepatic   Endo/Other  Hypothyroidism   Renal/GU      Musculoskeletal  (+) Arthritis ,   Abdominal   Peds  Hematology   Anesthesia Other Findings   Reproductive/Obstetrics                             Anesthesia Physical Anesthesia Plan  ASA: II  Anesthesia Plan: General   Post-op Pain Management:    Induction: Intravenous  PONV Risk Score and Plan:   Airway Management Planned:   Additional Equipment:   Intra-op Plan:   Post-operative Plan:   Informed Consent: I have reviewed the patients History and Physical, chart, labs and discussed the procedure including the risks, benefits and alternatives for the proposed anesthesia with the patient or authorized representative who has indicated his/her understanding and acceptance.     Plan Discussed with: CRNA  Anesthesia Plan Comments:         Anesthesia Quick Evaluation

## 2018-02-08 NOTE — Anesthesia Post-op Follow-up Note (Signed)
Anesthesia QCDR form completed.        

## 2018-02-08 NOTE — Anesthesia Postprocedure Evaluation (Signed)
Anesthesia Post Note  Patient: Diamond Lucas  Procedure(s) Performed: COLONOSCOPY WITH PROPOFOL (N/A )  Patient location during evaluation: Endoscopy Anesthesia Type: General Level of consciousness: awake and alert Pain management: pain level controlled Vital Signs Assessment: post-procedure vital signs reviewed and stable Respiratory status: spontaneous breathing, nonlabored ventilation, respiratory function stable and patient connected to nasal cannula oxygen Cardiovascular status: blood pressure returned to baseline and stable Postop Assessment: no apparent nausea or vomiting Anesthetic complications: no     Last Vitals:  Vitals:   02/08/18 0815 02/08/18 0912  BP: 139/84 116/78  Pulse: 74 79  Resp: 16 16  Temp: (!) 36.2 C (!) 36.1 C  SpO2: 99% 100%    Last Pain:  Vitals:   02/08/18 0932  TempSrc:   PainSc: 0-No pain                 Baran Kuhrt S

## 2018-02-08 NOTE — Transfer of Care (Signed)
Immediate Anesthesia Transfer of Care Note  Patient: Diamond Lucas  Procedure(s) Performed: COLONOSCOPY WITH PROPOFOL (N/A )  Patient Location: PACU and Endoscopy Unit  Anesthesia Type:General  Level of Consciousness: awake, alert , oriented and patient cooperative  Airway & Oxygen Therapy: Patient Spontanous Breathing  Post-op Assessment: Report given to RN and Post -op Vital signs reviewed and stable  Post vital signs: Reviewed and stable  Last Vitals:  Vitals Value Taken Time  BP 116/78 02/08/2018  9:11 AM  Temp    Pulse 76 02/08/2018  9:11 AM  Resp 18 02/08/2018  9:11 AM  SpO2 100 % 02/08/2018  9:11 AM  Vitals shown include unvalidated device data.  Last Pain:  Vitals:   02/08/18 0815  TempSrc: Tympanic         Complications: No apparent anesthesia complications

## 2018-02-08 NOTE — Op Note (Signed)
[facility] Gastroenterology Patient Name: Diamond Lucas Procedure Date: 02/08/2018 8:16 AM MRN: [id number] Account #: [id number] Date of Birth: 1961-03-18 Admit Type: Outpatient Age: 57 Room: [facility] ENDO ROOM 3 Gender: Female Note Status: Finalized Procedure:            Colonoscopy Indications:          Screening for colorectal malignant neoplasm Providers:            Faylene Allerton B. Bonna Gains MD, MD Referring MD:         Mar Daring (Referring MD) Medicines:            Monitored Anesthesia Care Complications:        No immediate complications. Procedure:            Pre-Anesthesia Assessment:                       - ASA Grade Assessment: II - A patient with mild                        systemic disease.                       - Prior to the procedure, a History and Physical was                        performed, and patient medications, allergies and                        sensitivities were reviewed. The patient's tolerance of                        previous anesthesia was reviewed.                       - The risks and benefits of the procedure and the                        sedation options and risks were discussed with the                        patient. All questions were answered and informed                        consent was obtained.                       - Patient identification and proposed procedure were                        verified prior to the procedure by the physician, the                        nurse, the anesthesiologist, the anesthetist and the                        technician. The procedure was verified in the procedure                        room.  After obtaining informed consent, the colonoscope was                        passed under direct vision. Throughout the procedure,                        the patient's blood pressure, pulse, and oxygen                        saturations were monitored continuously. The                         Colonoscope was introduced through the anus and                        advanced to the the cecum, identified by appendiceal                        orifice and ileocecal valve. The colonoscopy was                        performed with ease. The patient tolerated the                        procedure well. The quality of the bowel preparation                        was good. Findings:      The perianal and digital rectal examinations were normal.      Two sessile polyps were found in the sigmoid colon and cecum. The polyps       were 3 to 4 mm in size. These polyps were removed with a cold biopsy       forceps. Resection and retrieval were complete.      The exam was otherwise without abnormality.      The rectum, sigmoid colon, descending colon, transverse colon, ascending       colon and cecum appeared normal.      Anal papilla(e) were hypertrophied.      No additional abnormalities were found on retroflexion. Impression:           - Two 3 to 4 mm polyps in the sigmoid colon and in the                        cecum, removed with a cold biopsy forceps. Resected and                        retrieved.                       - The examination was otherwise normal.                       - The rectum, sigmoid colon, descending colon,                        transverse colon, ascending colon and cecum are normal.                       - Anal papilla(e) were hypertrophied. Recommendation:       - Discharge  patient to home (with escort).                       - Advance diet as tolerated.                       - Continue present medications.                       - Await pathology results.                       - The findings and recommendations were discussed with                        the patient.                       - The findings and recommendations were discussed with                        the patient's family.                       - Return to primary care physician  as previously                        scheduled.                       - If the pathology report reveals adenomatous tissue,                        then repeat the colonoscopy for surveillance in 5 years.                       - If the pathology report indicates hyperplastic polyp,                        then repeat colonoscopy for screening purposes in 10                        years.                       - In the future, if patient develops new symptoms such                        as blood per rectum, abdominal pain, weight loss,                        altered bowel habits or any other reason for concern,                        patient should discuss this with her PCP as she may                        need a GI referral at that time or evaluation for need                        for colonoscopy earlier than her recommended screening  colonoscopy.                       In addition, if patient's family history of colon                        cancer changes (no family history at this time) in the                        future, earlier screening may be indicated and patient                        should discuss this with PCP as well. Procedure Code(s):    --- Professional ---                       [phone], Colonoscopy, flexible; with biopsy, single or                        multiple Diagnosis Code(s):    --- Professional ---                       Z12.11, Encounter for screening for malignant neoplasm                        of colon                       D12.5, Benign neoplasm of sigmoid colon                       D12.0, Benign neoplasm of cecum                       K62.89, Other specified diseases of anus and rectum CPT copyright 2018 American Medical Association. All rights reserved. The codes documented in this report are preliminary and upon coder review may  be revised to meet current compliance requirements.  Vonda Antigua, MD Margretta Sidle B. Bonna Gains MD,  MD 02/08/2018 9:17:55 AM This report has been signed electronically. Number of Addenda: 0 Note Initiated On: 02/08/2018 8:16 AM Scope Withdrawal Time: 0 hours 10 minutes 22 seconds  Total Procedure Duration: 0 hours 20 minutes 21 seconds  Estimated Blood Loss: Estimated blood loss: none.      [facility]

## 2018-02-09 ENCOUNTER — Encounter: Payer: Self-pay | Admitting: Gastroenterology

## 2018-02-09 LAB — SURGICAL PATHOLOGY

## 2018-02-13 ENCOUNTER — Telehealth: Payer: Self-pay | Admitting: Physician Assistant

## 2018-02-13 ENCOUNTER — Encounter: Payer: Self-pay | Admitting: Gastroenterology

## 2018-02-13 NOTE — Telephone Encounter (Signed)
Pt calling to check on the status of the travel insurance paperwork that she dropped off a couple of weeks ago.  Please advise.  Thanks, [employer]

## 2018-02-14 NOTE — Telephone Encounter (Signed)
LM that her paper work is ready and placed up front for pick up.

## 2018-02-14 NOTE — Telephone Encounter (Signed)
Pt advised that paperwork was ready and pt advised that she came on 02/14/18 to pick up.  dbs

## 2018-02-15 DIAGNOSIS — F209 Schizophrenia, unspecified: Secondary | ICD-10-CM | POA: Diagnosis not present

## 2018-02-24 ENCOUNTER — Encounter (INDEPENDENT_AMBULATORY_CARE_PROVIDER_SITE_OTHER): Payer: Self-pay

## 2018-02-26 NOTE — Telephone Encounter (Signed)
Currently the ultrasound that was scheduled on 03/05/2018 was cancelled.  However, you were scheduled for a venous reflux studies of the legs.  It does not require fasting and you would be able to drive yourself to and from the appointment.  Please let us know if you would like to have the test re-scheduled and we will have one of our schedulers contact you.

## 2018-03-05 ENCOUNTER — Encounter

## 2018-03-05 ENCOUNTER — Ambulatory Visit (INDEPENDENT_AMBULATORY_CARE_PROVIDER_SITE_OTHER): Payer: Medicare Other | Admitting: Vascular Surgery

## 2018-03-05 ENCOUNTER — Encounter (INDEPENDENT_AMBULATORY_CARE_PROVIDER_SITE_OTHER): Payer: Medicare Other

## 2018-03-06 DIAGNOSIS — F209 Schizophrenia, unspecified: Secondary | ICD-10-CM | POA: Diagnosis not present

## 2018-03-09 ENCOUNTER — Encounter: Payer: Self-pay | Admitting: Physician Assistant

## 2018-03-16 DIAGNOSIS — H9202 Otalgia, left ear: Secondary | ICD-10-CM | POA: Diagnosis not present

## 2018-03-29 DIAGNOSIS — F209 Schizophrenia, unspecified: Secondary | ICD-10-CM | POA: Diagnosis not present

## 2018-03-30 ENCOUNTER — Encounter: Payer: Self-pay | Admitting: Physician Assistant

## 2018-04-03 ENCOUNTER — Ambulatory Visit (INDEPENDENT_AMBULATORY_CARE_PROVIDER_SITE_OTHER): Payer: Medicare Other | Admitting: Physician Assistant

## 2018-04-03 ENCOUNTER — Encounter: Payer: Self-pay | Admitting: Physician Assistant

## 2018-04-03 VITALS — BP 114/77 | HR 64 | Temp 98.0°F | Resp 16 | Wt 119.0 lb

## 2018-04-03 DIAGNOSIS — M7662 Achilles tendinitis, left leg: Secondary | ICD-10-CM | POA: Diagnosis not present

## 2018-04-03 MED ORDER — METHYLPREDNISOLONE 4 MG PO TBPK: ORAL_TABLET | ORAL | 0 refills | Status: DC

## 2018-04-03 NOTE — Patient Instructions (Signed)
Methylprednisolone tablets What is this medicine? METHYLPREDNISOLONE (meth ill pred NISS oh lone) is a corticosteroid. It is commonly used to treat inflammation of the skin, joints, lungs, and other organs. Common conditions treated include asthma, allergies, and arthritis. It is also used for other conditions, such as blood disorders and diseases of the adrenal glands. This medicine may be used for other purposes; ask your health care provider or pharmacist if you have questions. COMMON BRAND NAME(S): Medrol, Medrol Dosepak What should I tell my health care provider before I take this medicine? They need to know if you have any of these conditions: -Cushing's syndrome -eye disease, vision problems -diabetes -glaucoma -heart disease -high blood pressure -infection (especially a virus infection such as chickenpox, cold sores, or herpes) -liver disease -mental illness -myasthenia gravis -osteoporosis -recently received or scheduled to receive a vaccine -seizures -stomach or intestine problems -thyroid disease -an unusual or allergic reaction to lactose, methylprednisolone, other medicines, foods, dyes, or preservatives -pregnant or trying to get pregnant -breast-feeding How should I use this medicine? Take this medicine by mouth with a glass of water. Follow the directions on the prescription label. Take this medicine with food. If you are taking this medicine once a day, take it in the morning. Do not take it more often than directed. Do not suddenly stop taking your medicine because you may develop a severe reaction. Your doctor will tell you how much medicine to take. If your doctor wants you to stop the medicine, the dose may be slowly lowered over time to avoid any side effects. Talk to your pediatrician regarding the use of this medicine in children. Special care may be needed. Overdosage: If you think you have taken too much of this medicine contact a poison control center or  emergency room at once. NOTE: This medicine is only for you. Do not share this medicine with others. What if I miss a dose? If you miss a dose, take it as soon as you can. If it is almost time for your next dose, talk to your doctor or health care professional. You may need to miss a dose or take an extra dose. Do not take double or extra doses without advice. What may interact with this medicine? Do not take this medicine with any of the following medications: -alefacept -echinacea -live virus vaccines -metyrapone -mifepristone This medicine may also interact with the following medications: -amphotericin B -aspirin and aspirin-like medicines -certain antibiotics like erythromycin, clarithromycin, troleandomycin -certain medicines for diabetes -certain medicines for fungal infections like ketoconazole -certain medicines for seizures like carbamazepine, phenobarbital, phenytoin -certain medicines that treat or prevent blood clots like warfarin -cholestyramine -cyclosporine -digoxin -diuretics -female hormones, like estrogens and birth control pills -isoniazid -NSAIDs, medicines for pain inflammation, like ibuprofen or naproxen -other medicines for myasthenia gravis -rifampin -vaccines This list may not describe all possible interactions. Give your health care provider a list of all the medicines, herbs, non-prescription drugs, or dietary supplements you use. Also tell them if you smoke, drink alcohol, or use illegal drugs. Some items may interact with your medicine. What should I watch for while using this medicine? Tell your doctor or healthcare professional if your symptoms do not start to get better or if they get worse. Do not stop taking except on your doctor's advice. You may develop a severe reaction. Your doctor will tell you how much medicine to take. This medicine may increase your risk of getting an infection. Tell your doctor or health care professional if  you are around  anyone with measles or chickenpox, or if you develop sores or blisters that do not heal properly. This medicine may affect blood sugar levels. If you have diabetes, check with your doctor or health care professional before you change your diet or the dose of your diabetic medicine. Tell your doctor or health care professional right away if you have any change in your eyesight. Using this medicine for a long time may increase your risk of low bone mass. Talk to your doctor about bone health. What side effects may I notice from receiving this medicine? Side effects that you should report to your doctor or health care professional as soon as possible: -allergic reactions like skin rash, itching or hives, swelling of the face, lips, or tongue -bloody or tarry stools -changes in vision -hallucination, loss of contact with reality -muscle cramps -muscle pain -palpitations -signs and symptoms of high blood sugar such as dizziness; dry mouth; dry skin; fruity breath; nausea; stomach pain; increased hunger or thirst; increased urination -signs and symptoms of infection like fever or chills; cough; sore throat; pain or trouble passing urine -trouble passing urine or change in the amount of urine Side effects that usually do not require medical attention (report to your doctor or health care professional if they continue or are bothersome): -changes in emotions or mood -constipation -diarrhea -excessive hair growth on the face or body -headache -nausea, vomiting -trouble sleeping -weight gain This list may not describe all possible side effects. Call your doctor for medical advice about side effects. You may report side effects to FDA at 1-800-FDA-1088. Where should I keep my medicine? Keep out of the reach of children. Store at room temperature between 20 and 25 degrees C (68 and 77 degrees F). Throw away any unused medicine after the expiration date. NOTE: This sheet is a summary. It may not  cover all possible information. If you have questions about this medicine, talk to your doctor, pharmacist, or health care provider.  2019 Elsevier/Gold Standard (2015-05-21 15:53:30)

## 2018-04-03 NOTE — Progress Notes (Signed)
Patient: Diamond Lucas Female    DOB: 27-Jun-1960   58 y.o.   MRN: [id number] Visit Date: 04/03/2018  Today's Provider: Mar Daring, PA-C   Chief Complaint  Patient presents with  . Foot Pain   Subjective:     Foot Injury   The incident occurred more than 1 week ago. The incident occurred at work. The pain is present in the left heel. The quality of the pain is described as aching and burning. The pain is at a severity of 8/10. The pain is moderate. The pain has been intermittent since onset. She has tried ice and acetaminophen for the symptoms. The treatment provided mild relief.   Patient states she caught her left heel on a ate at work one week ago. When the gate caught her foot it cut her skin, which has mostly healed now. Patient states she heel is a little swollen and very painful. Patient has been using ice and taking Tylenol with mild relief.   Allergies  Allergen Reactions  . Diclofenac Sodium Palpitations  . Naproxen Itching, Rash and Hives  . Olanzapine     Other reaction(s): Other Cross-eyed and hyperventilation  . Dust Mite Mixed Allergen Ext [Mite (D. Farinae)] Itching    Respiratory illness, and itchy eyes, eye problems  . Gluten Meal Other (See Comments)    Abdominal bloating  . Nsaids     Other reaction(s): Other (See Comments) Aleve causes hives 10/15 pt states she occ. Takes Motrin.  . Other Itching    Respiratory illness, and itchy eyes, eye problems Respiratory illness, and itchy eyes, eye problems     Current Outpatient Medications:  .  Calcium Carbonate-Vit D-Min (CALCIUM 600+D PLUS MINERALS) 600-400 MG-UNIT TABS, Take by mouth daily. , Disp: , Rfl:  .  Carboxymeth-Glyc-Polysorb PF (REFRESH OPTIVE MEGA-3) 0.5-1-0.5 % SOLN, Apply 2 drops to eye 2 (two) times daily., Disp: , Rfl:  .  levothyroxine (SYNTHROID, LEVOTHROID) 75 MCG tablet, TAKE 1 TABLET BY MOUTH DAILY, Disp: , Rfl:  .  Multiple Vitamin (MULTI-VITAMINS) TABS, daily. ,  Disp: , Rfl:  .  Omega-3 Fatty Acids (FISH OIL) 1000 MG CAPS, Take by mouth daily., Disp: , Rfl:  .  risperiDONE (RISPERDAL) 1 MG tablet, Take 1 mg by mouth at bedtime., Disp: , Rfl:  .  triamcinolone (NASACORT ALLERGY 24HR) 55 MCG/ACT AERO nasal inhaler, Place 2 sprays into the nose daily., Disp: , Rfl:  .  hydrocortisone (ANUSOL-HC) 25 MG suppository, Place 1 suppository (25 mg total) rectally 2 (two) times daily. (Patient not taking: Reported on 04/03/2018), Disp: 12 suppository, Rfl: 0 .  ondansetron (ZOFRAN) 4 MG tablet, Take 1 tablet (4 mg total) by mouth every 8 (eight) hours as needed. (Patient not taking: Reported on 01/09/2018), Disp: 20 tablet, Rfl: 0  Review of Systems  Constitutional: Negative for appetite change, chills, fatigue and fever.  Respiratory: Negative for chest tightness and shortness of breath.   Cardiovascular: Negative for chest pain and palpitations.  Gastrointestinal: Negative for abdominal pain, nausea and vomiting.  Musculoskeletal: Positive for arthralgias and joint swelling.  Skin: Negative for wound.  Neurological: Negative for dizziness and weakness.    Social History   Tobacco Use  . Smoking status: Never Smoker  . Smokeless tobacco: Never Used  Substance Use Topics  . Alcohol use: No      Objective:   BP 114/77 (BP Location: Right Arm, Patient Position: Sitting, Cuff Size: Normal)   Pulse 64  Temp 98 F (36.7 C) (Oral)   Resp 16   Wt 119 lb (54 kg)   LMP 04/10/2015 Comment: Only spotting  SpO2 98%   BMI 22.86 kg/m  Vitals:   04/03/18 1224  BP: 114/77  Pulse: 64  Resp: 16  Temp: 98 F (36.7 C)  TempSrc: Oral  SpO2: 98%  Weight: 119 lb (54 kg)     Physical Exam Vitals signs reviewed.  Constitutional:      Appearance: She is well-developed.  HENT:     Head: Normocephalic and atraumatic.  Neck:     Musculoskeletal: Normal range of motion and neck supple.  Pulmonary:     Effort: Pulmonary effort is normal. No respiratory  distress.  Musculoskeletal:     Left foot: Normal range of motion and normal capillary refill. Tenderness and swelling present. No bony tenderness.       Feet:  Psychiatric:        Behavior: Behavior normal.        Thought Content: Thought content normal.        Judgment: Judgment normal.         Assessment & Plan    1. Achilles bursitis of left lower extremity Will treat with medrol as noted below. May apply heat and ice. Call if worsening.  - methylPREDNISolone (MEDROL) 4 MG TBPK tablet; 6 day taper; take as directed on package instructions  Dispense: 21 tablet; Refill: 0     Mar Daring, PA-C  [facility]

## 2018-04-05 ENCOUNTER — Other Ambulatory Visit (HOSPITAL_COMMUNITY): Payer: Self-pay | Admitting: Psychiatry

## 2018-04-05 DIAGNOSIS — F209 Schizophrenia, unspecified: Secondary | ICD-10-CM

## 2018-04-12 DIAGNOSIS — F209 Schizophrenia, unspecified: Secondary | ICD-10-CM | POA: Diagnosis not present

## 2018-04-17 ENCOUNTER — Encounter: Payer: Self-pay | Admitting: Physician Assistant

## 2018-04-18 NOTE — Telephone Encounter (Signed)
Called patient an offered her an appointment. Per patient she has another appointment to go to today and she is not really feeling it to come to doctors offices but if she continues to feel bad that she would ask someone to "wheel her in" reminded patient to call for an appointment before coming in and if she feels like she is getting worse or think she might be having a stroke to go to the ED.  Thanks,  -[name]

## 2018-04-23 ENCOUNTER — Ambulatory Visit (INDEPENDENT_AMBULATORY_CARE_PROVIDER_SITE_OTHER): Payer: Medicare Other | Admitting: Vascular Surgery

## 2018-04-23 ENCOUNTER — Ambulatory Visit (INDEPENDENT_AMBULATORY_CARE_PROVIDER_SITE_OTHER): Payer: Medicare Other

## 2018-04-23 ENCOUNTER — Encounter (INDEPENDENT_AMBULATORY_CARE_PROVIDER_SITE_OTHER): Payer: Self-pay | Admitting: Vascular Surgery

## 2018-04-23 VITALS — BP 123/81 | HR 77 | Resp 16 | Ht 60.0 in | Wt 118.6 lb

## 2018-04-23 DIAGNOSIS — I872 Venous insufficiency (chronic) (peripheral): Secondary | ICD-10-CM

## 2018-04-23 DIAGNOSIS — I83811 Varicose veins of right lower extremities with pain: Secondary | ICD-10-CM | POA: Diagnosis not present

## 2018-04-23 DIAGNOSIS — I83819 Varicose veins of unspecified lower extremities with pain: Secondary | ICD-10-CM | POA: Insufficient documentation

## 2018-04-23 NOTE — Progress Notes (Signed)
MRN : [id number]  Diamond Lucas is a 58 y.o. ([birth year]) female who presents with chief complaint of No chief complaint on file. Marland Kitchen  History of Present Illness:   The patient returns for followup evaluation more than 3 months after the initial visit. The patient continues to have pain in the lower extremities with dependency. The pain is lessened with elevation. Graduated compression stockings, Class I (20-30 mmHg), have been worn but the stockings do not eliminate the leg pain. Over-the-counter analgesics do not improve the symptoms. The degree of discomfort continues to interfere with daily activities. The patient notes the pain in the legs is causing problems with daily exercise, at the workplace and even with household activities and maintenance such as standing in the kitchen preparing meals and doing dishes.   Venous ultrasound shows normal deep venous system, no evidence of acute or chronic DVT.  Superficial reflux is present in the right great saphenous vein  No outpatient medications have been marked as taking for the 04/23/18 encounter (Appointment) with Delana Meyer, Dolores Lory, MD.    Past Medical History:  Diagnosis Date  . Allergy   . Arrhythmia   . History of chicken pox   . History of measles   . History of syncope   . Osteoarthritis   . Rosacea   . Schizophrenia ([place])   . Thyroid disease     Past Surgical History:  Procedure Laterality Date  . COLONOSCOPY WITH PROPOFOL N/A 02/08/2018   Procedure: COLONOSCOPY WITH PROPOFOL;  Surgeon: Virgel Manifold, MD;  Location: [facility] ENDOSCOPY;  Service: Endoscopy;  Laterality: N/A;  . FOOT SURGERY Right   . THYROIDECTOMY      Social History Social History   Tobacco Use  . Smoking status: Never Smoker  . Smokeless tobacco: Never Used  Substance Use Topics  . Alcohol use: No  . Drug use: No    Family History Family History  Problem Relation Age of Onset  . Depression Mother   . Cataracts Mother   .  Stroke Mother        mini  . Cancer Other   . Heart failure Other   . Heart disease Other   . Stroke Other   . Diabetes Other   . Kidney cancer Father   . Anxiety disorder Brother     Allergies  Allergen Reactions  . Diclofenac Sodium Palpitations  . Naproxen Itching, Rash and Hives  . Olanzapine     Other reaction(s): Other Cross-eyed and hyperventilation  . Dust Mite Mixed Allergen Ext [Mite (D. Farinae)] Itching    Respiratory illness, and itchy eyes, eye problems  . Gluten Meal Other (See Comments)    Abdominal bloating  . Nsaids     Other reaction(s): Other (See Comments) Aleve causes hives 10/15 pt states she occ. Takes Motrin.  . Other Itching    Respiratory illness, and itchy eyes, eye problems Respiratory illness, and itchy eyes, eye problems     REVIEW OF SYSTEMS (Negative unless checked)  Constitutional: [] Weight loss  [] Fever  [] Chills Cardiac: [] Chest pain   [] Chest pressure   [] Palpitations   [] Shortness of breath when laying flat   [] Shortness of breath with exertion. Vascular:  [] Pain in legs with walking   [x] Pain in legs with dependency  [] History of DVT   [] Phlebitis   [x] Swelling in legs   [x] Varicose veins   [] Non-healing ulcers Pulmonary:   [] Uses home oxygen   [] Productive cough   [] Hemoptysis   [] Wheeze  []   COPD   [] Asthma Neurologic:  [] Dizziness   [] Seizures   [] History of stroke   [] History of TIA  [] Aphasia   [] Vissual changes   [] Weakness or numbness in arm   [] Weakness or numbness in leg Musculoskeletal:   [] Joint swelling   [] Joint pain   [] Low back pain Hematologic:  [] Easy bruising  [] Easy bleeding   [] Hypercoagulable state   [] Anemic Gastrointestinal:  [] Diarrhea   [] Vomiting  [] Gastroesophageal reflux/heartburn   [] Difficulty swallowing. Genitourinary:  [] Chronic kidney disease   [] Difficult urination  [] Frequent urination   [] Blood in urine Skin:  [] Rashes   [] Ulcers  Psychological:  [] History of anxiety   []  History of major  depression.  Physical Examination  There were no vitals filed for this visit. There is no height or weight on file to calculate BMI. Gen: WD/WN, NAD Head: [place]/AT, No temporalis wasting.  Ear/Nose/Throat: Hearing grossly intact, nares w/o erythema or drainage Eyes: PER, EOMI, sclera nonicteric.  Neck: Supple, no large masses.   Pulmonary:  Good air movement, no audible wheezing bilaterally, no use of accessory muscles.  Cardiac: RRR, no JVD Vascular: Large varicosities present extensively greater than 6 mm right.  Mild venous stasis changes to the legs bilaterally.  2+ soft pitting edema Vessel Right Left  PT Palpable Palpable  DP Palpable Palpable  Gastrointestinal: Non-distended. No guarding/no peritoneal signs.  Musculoskeletal: M/S 5/5 throughout.  No deformity or atrophy.  Neurologic: CN 2-12 intact. Symmetrical.  Speech is fluent. Motor exam as listed above. Psychiatric: Judgment intact, Mood & affect appropriate for pt's clinical situation. Dermatologic: mild venous rashes no ulcers noted.  No changes consistent with cellulitis. Lymph : No lichenification or skin changes of chronic lymphedema.  CBC Lab Results  Component Value Date   WBC 8.1 10/31/2017   HGB 14.8 10/31/2017   HCT 42.8 10/31/2017   MCV 91.8 10/31/2017   PLT 236 10/31/2017    BMET    Component Value Date/Time   NA 140 10/31/2017 1355   NA 142 07/12/2017 1607   K 3.8 10/31/2017 1355   CL 102 10/31/2017 1355   CO2 30 10/31/2017 1355   GLUCOSE 104 (H) 10/31/2017 1355   BUN 16 10/31/2017 1355   BUN 13 07/12/2017 1607   CREATININE 0.89 10/31/2017 1355   CALCIUM 9.0 10/31/2017 1355   GFRNONAA >60 10/31/2017 1355   GFRAA >60 10/31/2017 1355   CrCl cannot be calculated (Patient's most recent lab result is older than the maximum 21 days allowed.).  COAG No results found for: INR, PROTIME  Radiology No results found.   Assessment/Plan 1. Chronic venous insufficiency No surgery or intervention at  this point in time.    I have had a long discussion with the patient regarding venous insufficiency and why it  causes symptoms. I have discussed with the patient the chronic skin changes that accompany venous insufficiency and the long term sequela such as infection and ulceration.  Patient will begin wearing graduated compression stockings class 1 (20-30 mmHg) or compression wraps on a daily basis a prescription was given. The patient will put the stockings on first thing in the morning and removing them in the evening. The patient is instructed specifically not to sleep in the stockings.    In addition, behavioral modification including several periods of elevation of the lower extremities during the day will be continued. I have demonstrated that proper elevation is a position with the ankles at heart level.  The patient is instructed to begin routine exercise,  especially walking on a daily basis  2. Varicose veins of right lower extremity with pain. Recommend:  The patient is complaining of varicose veins associated with some pain and swelling of the right leg.    I have had a long discussion with the patient regarding  varicose veins and why they cause symptoms.  Patient will begin wearing graduated compression stockings on a daily basis, beginning first thing in the morning and removing them in the evening. The patient is instructed specifically not to sleep in the stockings.    The patient  will continue using over-the-counter analgesics such as Motrin 600 mg po TID to help control the symptoms as needed.    In addition, behavioral modification including elevation during the day will be initiated, utilizing a recliner was recommended.  The patient is also instructed to continue exercising such as walking 4-5 times per week.  At this time the patient wishes to continue conservative therapy and is not interested in more invasive treatments such as laser ablation and sclerotherapy.  The  Patient will follow up PRN if the symptoms worsen.   Hortencia Pilar, MD  04/23/2018 8:49 AM

## 2018-04-28 ENCOUNTER — Encounter: Payer: Self-pay | Admitting: Physician Assistant

## 2018-05-02 ENCOUNTER — Encounter: Payer: Self-pay | Admitting: Physician Assistant

## 2018-05-04 ENCOUNTER — Encounter: Payer: Self-pay | Admitting: Physician Assistant

## 2018-05-04 ENCOUNTER — Ambulatory Visit (INDEPENDENT_AMBULATORY_CARE_PROVIDER_SITE_OTHER): Payer: Medicare Other | Admitting: Physician Assistant

## 2018-05-04 VITALS — BP 125/83 | HR 61 | Temp 98.2°F | Resp 16 | Wt 121.4 lb

## 2018-05-04 DIAGNOSIS — F22 Delusional disorders: Secondary | ICD-10-CM | POA: Diagnosis not present

## 2018-05-04 DIAGNOSIS — L299 Pruritus, unspecified: Secondary | ICD-10-CM | POA: Diagnosis not present

## 2018-05-04 NOTE — Progress Notes (Signed)
Patient: Diamond Lucas Female    DOB: Mar 23, 1961   58 y.o.   MRN: [id number] Visit Date: 05/04/2018  Today's Provider: Mar Daring, PA-C   Chief Complaint  Patient presents with  . itchy scalp   Subjective:     HPI  Patient here today with c/o Itchy scalp. Patient reports that feels like she has 2 varieties of insects living on her scalp. She has washed her pillow cases frequently with bleach and borax. She has combed out with Nix Ultra and have not found anything. Also tried using otc shampoo with 4% peroxyl butoxide and 0.33% pyrethrins. Reports that after washing her hair her scalp feels good no bug sensations while it is wet but after the hair dries I feel the insect sensations again.    Allergies  Allergen Reactions  . Diclofenac Sodium Palpitations  . Naproxen Itching, Rash and Hives  . Olanzapine     Other reaction(s): Other Cross-eyed and hyperventilation  . Gluten Meal Other (See Comments)    Abdominal bloating  . Mite (D. Farinae) Itching    Respiratory illness, and itchy eyes, eye problems Respiratory illness, and itchy eyes, eye problems  . Nsaids Other (See Comments)    Other reaction(s): Other (See Comments) Aleve causes hives 10/15 pt states she occ. Takes Motrin. Other reaction(s): Other (See Comments) Aleve causes hives  . Other Itching    Respiratory illness, and itchy eyes, eye problems Respiratory illness, and itchy eyes, eye problems     Current Outpatient Medications:  .  Calcium Carbonate-Vit D-Min (CALCIUM 600+D PLUS MINERALS) 600-400 MG-UNIT TABS, Take by mouth daily. , Disp: , Rfl:  .  Carboxymeth-Glyc-Polysorb PF (REFRESH OPTIVE MEGA-3) 0.5-1-0.5 % SOLN, Apply 2 drops to eye 2 (two) times daily., Disp: , Rfl:  .  levothyroxine (SYNTHROID, LEVOTHROID) 75 MCG tablet, TAKE 1 TABLET BY MOUTH DAILY, Disp: , Rfl:  .  Multiple Vitamin (MULTI-VITAMINS) TABS, daily. , Disp: , Rfl:  .  risperiDONE (RISPERDAL) 1 MG tablet, Take 1  mg by mouth at bedtime., Disp: , Rfl:  .  triamcinolone (NASACORT ALLERGY 24HR) 55 MCG/ACT AERO nasal inhaler, Place 2 sprays into the nose daily., Disp: , Rfl:  .  hydrocortisone (ANUSOL-HC) 25 MG suppository, Place 1 suppository (25 mg total) rectally 2 (two) times daily. (Patient not taking: Reported on 04/03/2018), Disp: 12 suppository, Rfl: 0 .  Omega-3 Fatty Acids (FISH OIL) 1000 MG CAPS, Take by mouth daily., Disp: , Rfl:  .  ondansetron (ZOFRAN) 4 MG tablet, Take 1 tablet (4 mg total) by mouth every 8 (eight) hours as needed. (Patient not taking: Reported on 01/09/2018), Disp: 20 tablet, Rfl: 0  Review of Systems  Constitutional: Negative.   Respiratory: Negative.   Cardiovascular: Negative for chest pain, palpitations and leg swelling.  Skin:       Itching scalp  Psychiatric/Behavioral: Positive for hallucinations.    Social History   Tobacco Use  . Smoking status: Never Smoker  . Smokeless tobacco: Never Used  Substance Use Topics  . Alcohol use: No      Objective:   BP 125/83 (BP Location: Left Arm, Patient Position: Sitting, Cuff Size: Normal)   Pulse 61   Temp 98.2 F (36.8 C) (Oral)   Resp 16   Wt 121 lb 6.4 oz (55.1 kg)   LMP 04/10/2015 Comment: Only spotting  BMI 23.71 kg/m  Vitals:   05/04/18 1153  BP: 125/83  Pulse: 61  Resp: 16  Temp: 98.2 F (36.8 C)  TempSrc: Oral  Weight: 121 lb 6.4 oz (55.1 kg)     Physical Exam Vitals signs reviewed.  Constitutional:      General: She is not in acute distress.    Appearance: She is normal weight.  HENT:     Head: Normocephalic and atraumatic.     Comments: Scalp examined thoroughly and no nits, eggs or bugs noted. Neurological:     Mental Status: She is alert.  Psychiatric:        Attention and Perception: Attention normal.        Mood and Affect: Mood and affect normal.        Speech: Speech normal.        Behavior: Behavior normal. Behavior is cooperative.        Thought Content: Thought content is  delusional.         Assessment & Plan    1. Scalp itch Scalp normal today. No seborrheic dermatitis noted or nits. Advised patient to not use other chemicals at this time as they may worsen the issue. She states "if they come back I will scrape my scalp and put in a bag for you to look at."   2. Delusional disorder, somatic type [facility] Patient reports medication compliance at this time. However, delusions of bugs (her most common delusion) has started to return. Discussed making sure she follows up with psych and she states she does.      Mar Daring, PA-C  [facility]

## 2018-05-15 ENCOUNTER — Telehealth: Payer: Self-pay

## 2018-05-15 ENCOUNTER — Encounter: Payer: Self-pay | Admitting: Physician Assistant

## 2018-05-15 DIAGNOSIS — J019 Acute sinusitis, unspecified: Secondary | ICD-10-CM | POA: Diagnosis not present

## 2018-05-15 NOTE — Telephone Encounter (Signed)
Noted. Patient has known delusional disorder.

## 2018-05-15 NOTE — Telephone Encounter (Signed)
Patient was seen at the urgent care today for sinus infection. Patient stated to the [facility] [phone] today that she was pregnant with a lot of fetus. He wanted to inform you.

## 2018-05-17 DIAGNOSIS — F209 Schizophrenia, unspecified: Secondary | ICD-10-CM | POA: Diagnosis not present

## 2018-05-28 DIAGNOSIS — M436 Torticollis: Secondary | ICD-10-CM | POA: Diagnosis not present

## 2018-05-28 DIAGNOSIS — M542 Cervicalgia: Secondary | ICD-10-CM | POA: Diagnosis not present

## 2018-05-28 DIAGNOSIS — R51 Headache: Secondary | ICD-10-CM | POA: Diagnosis not present

## 2018-05-28 DIAGNOSIS — M9901 Segmental and somatic dysfunction of cervical region: Secondary | ICD-10-CM | POA: Diagnosis not present

## 2018-05-29 ENCOUNTER — Encounter: Payer: Self-pay | Admitting: Physician Assistant

## 2018-05-30 DIAGNOSIS — M542 Cervicalgia: Secondary | ICD-10-CM | POA: Diagnosis not present

## 2018-05-30 DIAGNOSIS — R51 Headache: Secondary | ICD-10-CM | POA: Diagnosis not present

## 2018-05-30 DIAGNOSIS — M9901 Segmental and somatic dysfunction of cervical region: Secondary | ICD-10-CM | POA: Diagnosis not present

## 2018-05-30 DIAGNOSIS — M436 Torticollis: Secondary | ICD-10-CM | POA: Diagnosis not present

## 2018-06-01 ENCOUNTER — Ambulatory Visit (INDEPENDENT_AMBULATORY_CARE_PROVIDER_SITE_OTHER): Payer: Medicare Other | Admitting: Physician Assistant

## 2018-06-01 ENCOUNTER — Encounter: Payer: Self-pay | Admitting: Physician Assistant

## 2018-06-01 VITALS — BP 129/85 | HR 62 | Temp 97.7°F | Resp 16 | Wt 121.0 lb

## 2018-06-01 DIAGNOSIS — Z2089 Contact with and (suspected) exposure to other communicable diseases: Secondary | ICD-10-CM

## 2018-06-01 DIAGNOSIS — Z207 Contact with and (suspected) exposure to pediculosis, acariasis and other infestations: Secondary | ICD-10-CM

## 2018-06-01 DIAGNOSIS — G4452 New daily persistent headache (NDPH): Secondary | ICD-10-CM | POA: Diagnosis not present

## 2018-06-01 MED ORDER — PERMETHRIN 5 % EX CREA: 1.0000 "application " | TOPICAL_CREAM | Freq: Once | CUTANEOUS | 0 refills | Status: AC

## 2018-06-01 NOTE — Patient Instructions (Signed)

## 2018-06-01 NOTE — Progress Notes (Signed)
Patient: Diamond Lucas Female    DOB: [birth year]   58 y.o.   MRN: [id number] Visit Date: 06/01/2018  Today's Provider: Mar Daring, PA-C   Chief Complaint  Patient presents with  . Headache   Subjective:    I, Sulibeya S. Dimas, CMA, am acting as a Education administrator for [employer], PA-C.  HPI Patient here today c/o recurrent head aches x's one month. Patient reports head aches are occurring just about every day. Patient reports pain last a few minutes. Headaches are most consistently throbbing pain over the left eye and radiates to the posterior head. Patient reports taking OTC Tylenol and reports good symptom control. Patient reports she has been under some stress due to job loss. Patient was employed by [employer] which has closed.  Patient also concerned she has been exposed to scabies. Patient does have known delusional disorder most often with delusions of insects. She states she knows she was exposed at the chiropractor. No active bugs or bites noted.  Allergies  Allergen Reactions  . Diclofenac Sodium Palpitations  . Naproxen Itching, Rash and Hives  . Olanzapine     Other reaction(s): Other Cross-eyed and hyperventilation  . Gluten Meal Other (See Comments)    Abdominal bloating  . Mite (D. Farinae) Itching    Respiratory illness, and itchy eyes, eye problems Respiratory illness, and itchy eyes, eye problems  . Nsaids Other (See Comments)    Other reaction(s): Other (See Comments) Aleve causes hives 10/15 pt states she occ. Takes Motrin. Other reaction(s): Other (See Comments) Aleve causes hives  . Other Itching    Respiratory illness, and itchy eyes, eye problems Respiratory illness, and itchy eyes, eye problems     Current Outpatient Medications:  .  Calcium Carbonate-Vit D-Min (CALCIUM 600+D PLUS MINERALS) 600-400 MG-UNIT TABS, Take by mouth daily. , Disp: , Rfl:  .  Carboxymeth-Glyc-Polysorb PF (REFRESH OPTIVE MEGA-3) 0.5-1-0.5 % SOLN, Apply 2  drops to eye 2 (two) times daily., Disp: , Rfl:  .  levothyroxine (SYNTHROID, LEVOTHROID) 75 MCG tablet, TAKE 1 TABLET BY MOUTH DAILY, Disp: , Rfl:  .  Multiple Vitamin (MULTI-VITAMINS) TABS, daily. , Disp: , Rfl:  .  Omega-3 Fatty Acids (FISH OIL) 1000 MG CAPS, Take by mouth daily., Disp: , Rfl:  .  risperiDONE (RISPERDAL) 1 MG tablet, Take 1 mg by mouth at bedtime., Disp: , Rfl:  .  triamcinolone (NASACORT ALLERGY 24HR) 55 MCG/ACT AERO nasal inhaler, Place 2 sprays into the nose daily., Disp: , Rfl:   Review of Systems  Constitutional: Negative.   Eyes: Positive for photophobia.  Respiratory: Negative.   Cardiovascular: Negative.   Gastrointestinal: Positive for nausea.  Skin: Positive for rash.  Neurological: Positive for headaches.    Social History   Tobacco Use  . Smoking status: Never Smoker  . Smokeless tobacco: Never Used  Substance Use Topics  . Alcohol use: No      Objective:   BP 129/85 (BP Location: Left Arm, Patient Position: Sitting, Cuff Size: Normal)   Pulse 62   Temp 97.7 F (36.5 C) (Oral)   Resp 16   Wt 121 lb (54.9 kg)   LMP 04/10/2015 Comment: Only spotting  SpO2 97%   BMI 23.63 kg/m  Vitals:   06/01/18 1455  BP: 129/85  Pulse: 62  Resp: 16  Temp: 97.7 F (36.5 C)  TempSrc: Oral  SpO2: 97%  Weight: 121 lb (54.9 kg)     Physical Exam Vitals  signs reviewed.  Constitutional:      General: She is not in acute distress.    Appearance: She is well-developed and normal weight. She is not ill-appearing or diaphoretic.  HENT:     Head: Normocephalic and atraumatic.     Mouth/Throat:     Mouth: Mucous membranes are moist.  Eyes:     Extraocular Movements: Extraocular movements intact.     Right eye: Normal extraocular motion and no nystagmus.     Left eye: Normal extraocular motion and no nystagmus.     Pupils: Pupils are equal, round, and reactive to light.  Neck:     Musculoskeletal: Normal range of motion and neck supple.     Thyroid:  No thyromegaly.     Vascular: No JVD.     Trachea: No tracheal deviation.  Cardiovascular:     Rate and Rhythm: Normal rate and regular rhythm.     Heart sounds: Normal heart sounds. No murmur. No friction rub. No gallop.   Pulmonary:     Effort: Pulmonary effort is normal. No respiratory distress.     Breath sounds: Normal breath sounds. No wheezing or rales.  Lymphadenopathy:     Cervical: No cervical adenopathy.  Neurological:     Mental Status: She is alert and oriented to person, place, and time. Mental status is at baseline.     Cranial Nerves: No cranial nerve deficit.     Sensory: No sensory deficit.     Coordination: Romberg sign negative. Coordination normal.     Gait: Gait normal.     Deep Tendon Reflexes: Reflexes normal.  Psychiatric:        Mood and Affect: Mood normal.        Speech: Speech normal.        Behavior: Behavior normal.        Thought Content: Thought content is delusional.         Assessment & Plan    1. New daily persistent headache Patient with new persistent headache. DDx: new onset migraine, tension headache, chronic sinus headache, mass. Will get CT as below to r/o brain lesion as source. Once CT reviewed will discuss treatment options.  - CT Head Wo Contrast; Future  2. Scabies exposure Will treat with permethrin as below.  - permethrin (ELIMITE) 5 % cream; Apply 1 application topically once for 1 dose.  Dispense: 60 g; Refill: 0     Mar Daring, PA-C  [facility]

## 2018-06-05 ENCOUNTER — Encounter: Payer: Self-pay | Admitting: Physician Assistant

## 2018-06-05 DIAGNOSIS — L2084 Intrinsic (allergic) eczema: Secondary | ICD-10-CM

## 2018-06-08 ENCOUNTER — Ambulatory Visit
Admission: RE | Admit: 2018-06-08 | Discharge: 2018-06-08 | Disposition: A | Discharge status: Home / Self Care | Payer: Medicare Other | Source: Ambulatory Visit | Attending: Physician Assistant | Admitting: Physician Assistant

## 2018-06-08 ENCOUNTER — Other Ambulatory Visit: Payer: Self-pay

## 2018-06-08 ENCOUNTER — Telehealth: Payer: Self-pay

## 2018-06-08 DIAGNOSIS — R51 Headache: Secondary | ICD-10-CM | POA: Diagnosis not present

## 2018-06-08 DIAGNOSIS — G4452 New daily persistent headache (NDPH): Secondary | ICD-10-CM | POA: Insufficient documentation

## 2018-06-08 IMAGING — CT CT HEAD WITHOUT CONTRAST
2 of 4 series · 12 of 47 positions shown, 15 images · non-contrast
Comparison: none

CLINICAL DATA: Severe headache for approximately 3 months

EXAM:
CT HEAD WITHOUT CONTRAST
TECHNIQUE: Contiguous axial images were obtained from the base of the skull
through the vertex without intravenous contrast.
[DATE]

[Series 2: head · axial · 0.29mm/px · z∈[-600,-482]mm · 9 of 28 slices shown, 12 images (1 of 2)]
[im 2/28  brain]
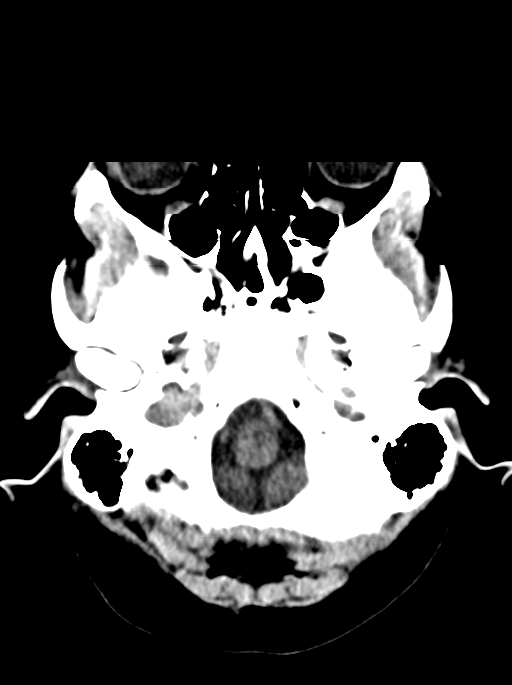
[im 2/28  bone]
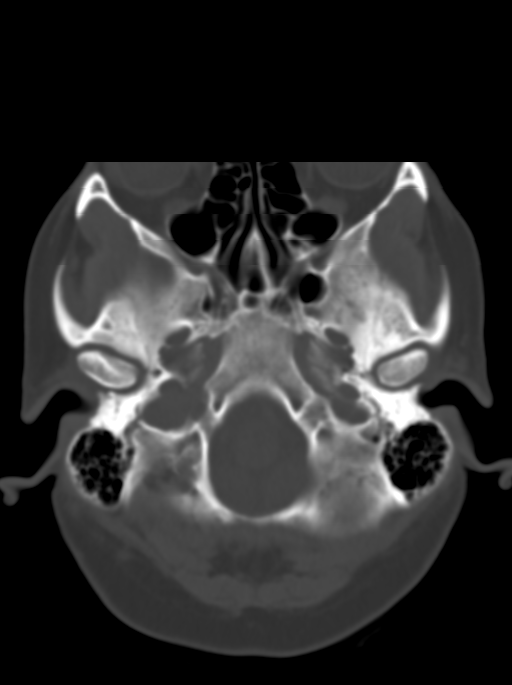
[im 6/28  brain]
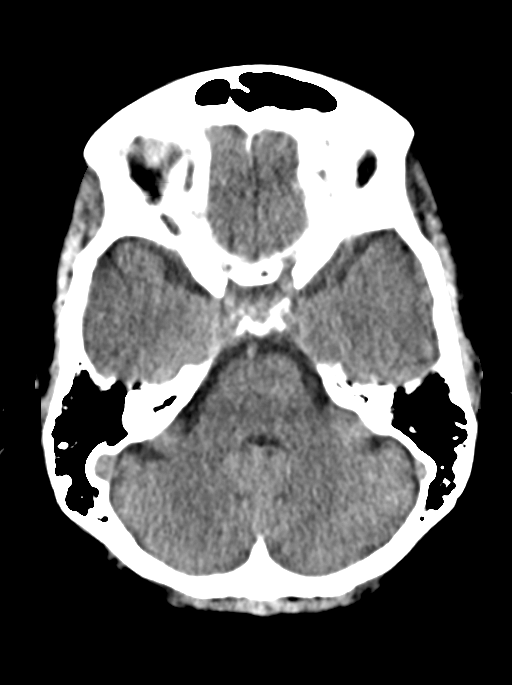
[im 8/28  brain]
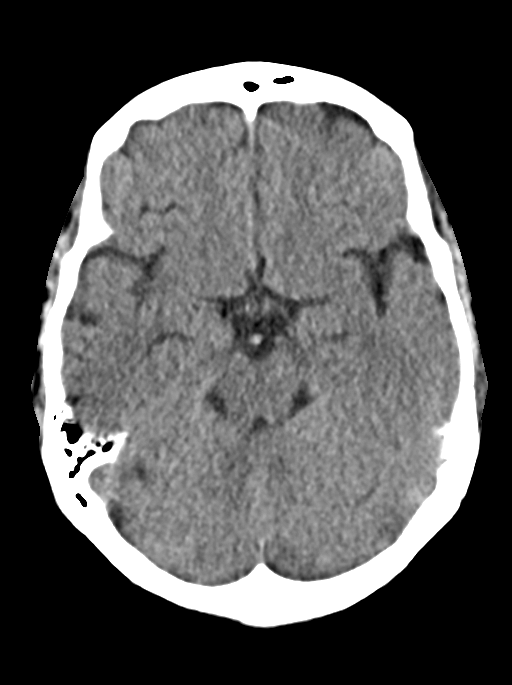
[im 12/28  brain]
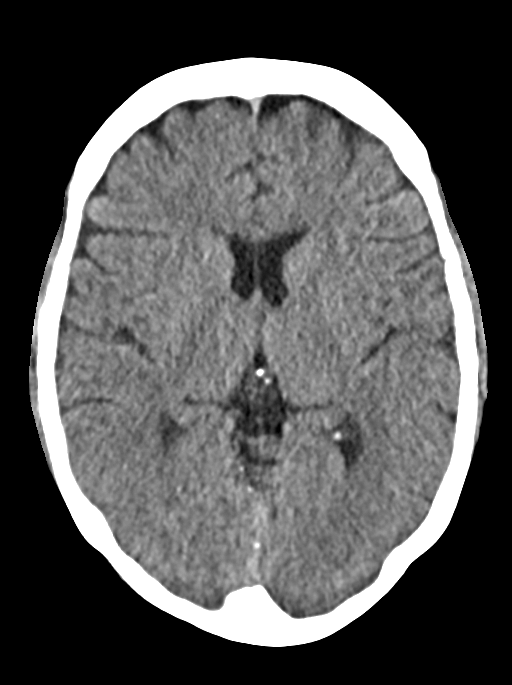
[im 14/28  brain]
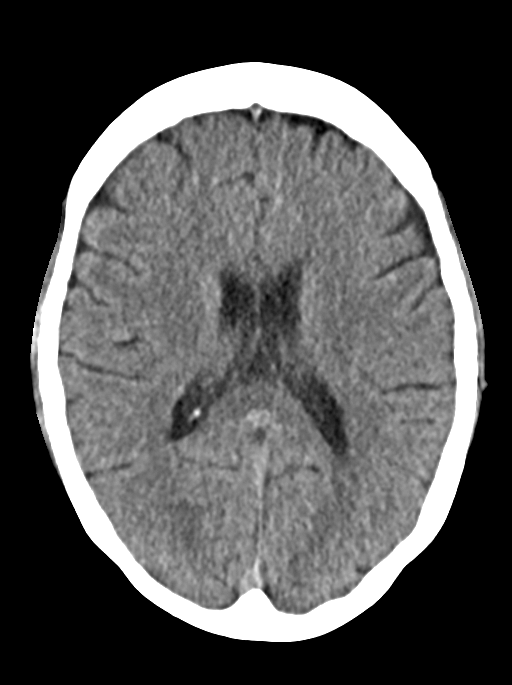
[im 14/28  bone]
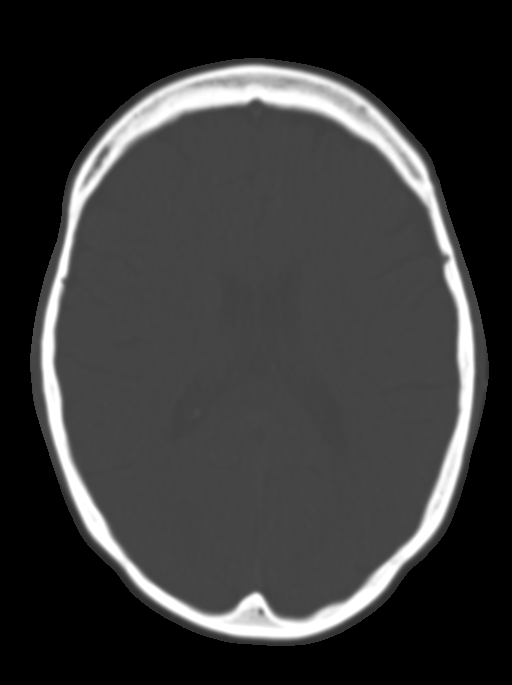
[im 16/28  brain]
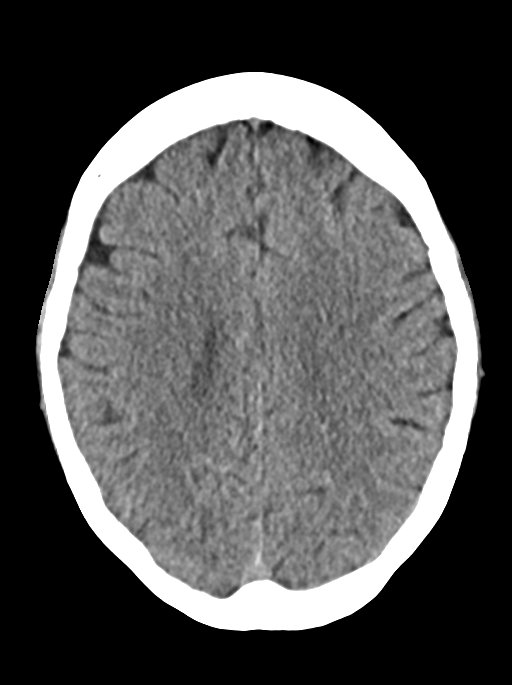
[im 20/28  brain]
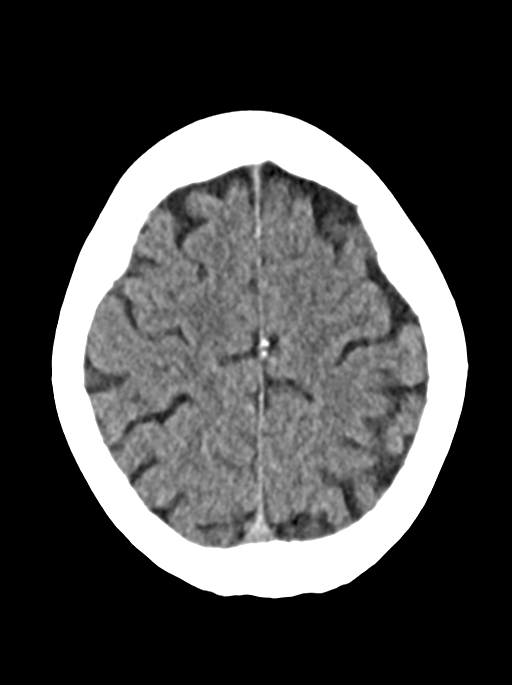
[im 22/28  brain]
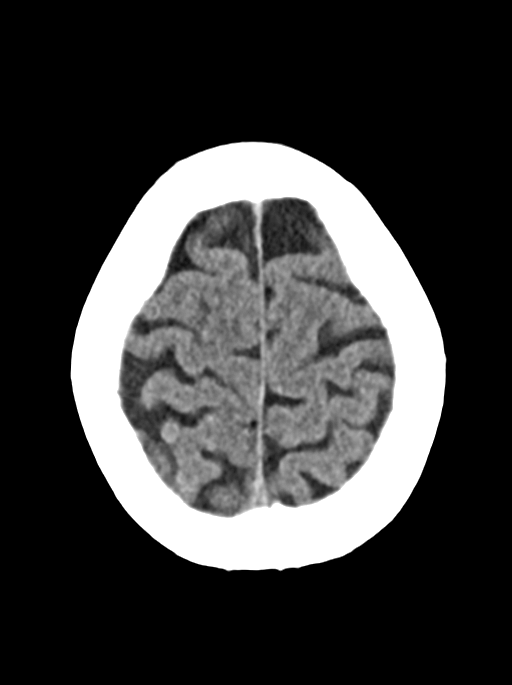
[im 26/28  brain]
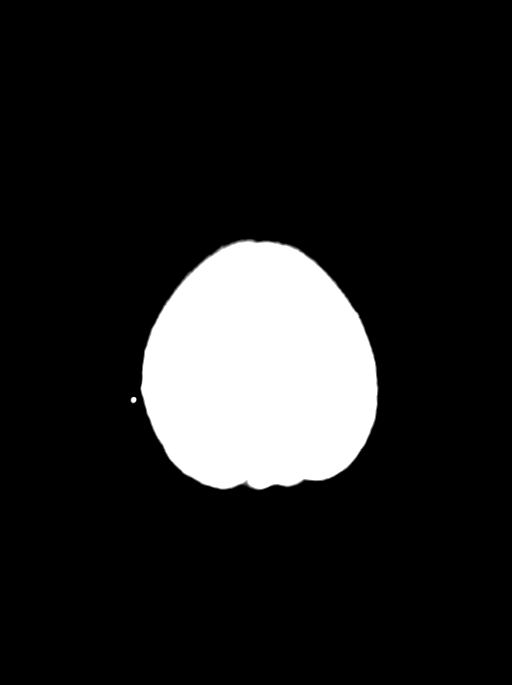
[im 26/28  bone]
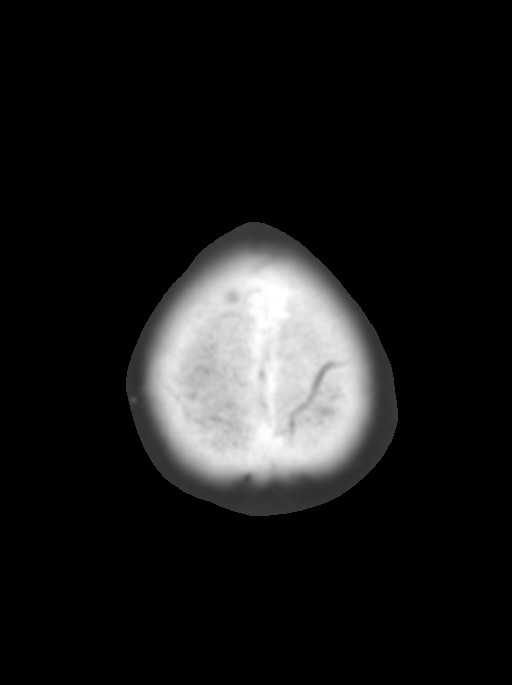

[Series 6: head · coronal · 0.26mm/px · 3 of 61 slices shown (2 of 2)]
[im 21/61  brain]
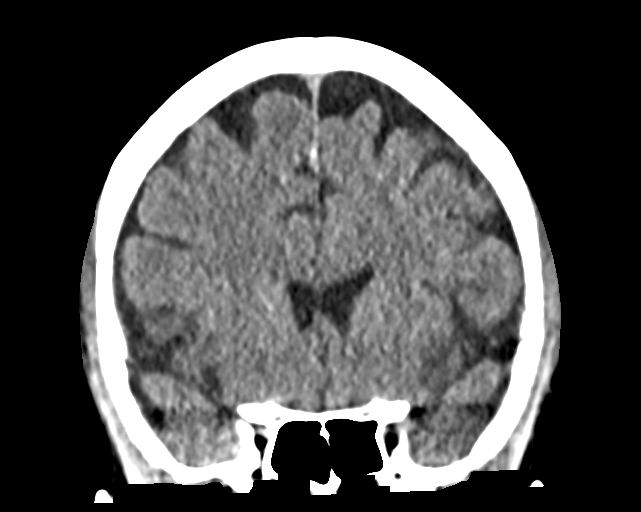
[im 27/61  brain]
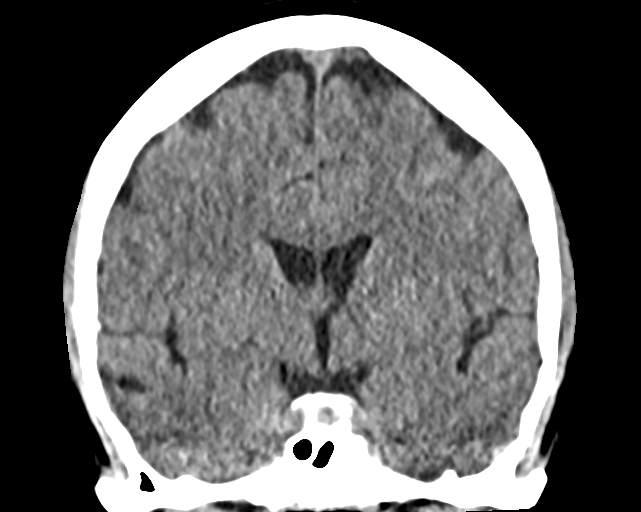
[im 34/61  brain]
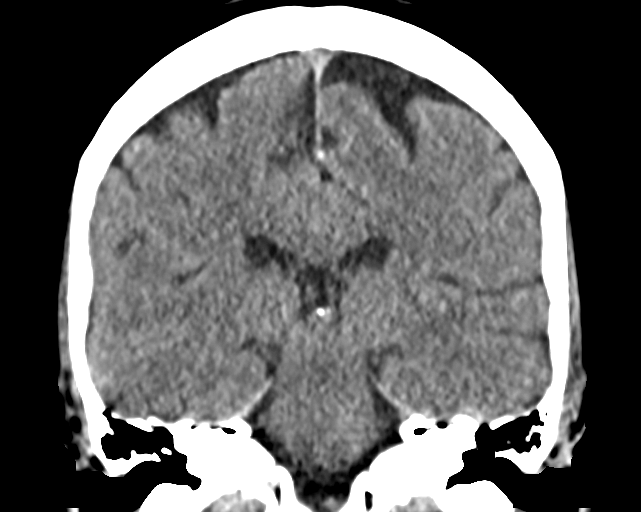

[12 of 47 positions shown; findings below may reference images not displayed]

FINDINGS: Brain: The ventricles are normal in size and configuration. There is
no intracranial mass, hemorrhage, extra-axial fluid collection, or
midline shift. The brain parenchyma appears normal. No acute infarct
evident.

Vascular: No hyperdense vessel. There is no appreciable vascular
calcification.

Skull: The bony calvarium appears intact. There is a right parietal
region sebaceous cyst containing calcification measuring 8 x 8 mm.

Sinuses/Orbits: Visualized paranasal sinuses are clear. Visualized
orbits appear symmetric bilaterally.

Other: Visualized mastoid air cells are clear.
IMPRESSION: Small right parietal region sebaceous cyst. Study otherwise
unremarkable.

## 2018-06-08 MED ORDER — MOMETASONE FUROATE 0.1 % EX CREA: 1.0000 "application " | TOPICAL_CREAM | Freq: Every day | CUTANEOUS | 0 refills | Status: DC

## 2018-06-08 NOTE — Telephone Encounter (Signed)
-----   Message from Mar Daring, [place] sent at 06/08/2018  2:14 PM EDT ----- CT head unremarkable. No mass or abnormality to cause a headache. Small sebaceous cyst noted on the right scalp. These normally do not cause issue.

## 2018-06-08 NOTE — Telephone Encounter (Signed)
Patient advised as below.  

## 2018-06-08 NOTE — Addendum Note (Signed)
Addended by: Mar Daring on: 06/08/2018 06:57 PM   Modules accepted: Orders

## 2018-06-11 ENCOUNTER — Other Ambulatory Visit: Payer: Self-pay

## 2018-06-11 ENCOUNTER — Encounter: Payer: Self-pay | Admitting: Physician Assistant

## 2018-06-11 ENCOUNTER — Ambulatory Visit (INDEPENDENT_AMBULATORY_CARE_PROVIDER_SITE_OTHER): Payer: Medicare Other | Admitting: Physician Assistant

## 2018-06-11 VITALS — BP 110/76 | HR 66 | Temp 98.0°F | Ht 61.0 in | Wt 122.2 lb

## 2018-06-11 DIAGNOSIS — L299 Pruritus, unspecified: Secondary | ICD-10-CM

## 2018-06-11 MED ORDER — LINDANE 1 % EX LOTN: 1.0000 "application " | TOPICAL_LOTION | Freq: Once | CUTANEOUS | 0 refills | Status: AC

## 2018-06-11 NOTE — Progress Notes (Signed)
Patient: Diamond Lucas Female    DOB: [birth year]   58 y.o.   MRN: [id number] Visit Date: 06/11/2018  Today's Provider: Mar Daring, PA-C   Chief Complaint  Patient presents with  . skin itching    all over back   Subjective:     HPI  Pt reports she is having itching on her back.  She went to chiropractor and used a water table and she feels she attracted some type of micro-organism. She is still having itching on her back, upper arms and chest.   Allergies  Allergen Reactions  . Diclofenac Sodium Palpitations  . Naproxen Itching, Rash and Hives  . Olanzapine     Other reaction(s): Other Cross-eyed and hyperventilation  . Gluten Meal Other (See Comments)    Abdominal bloating  . Mite (D. Farinae) Itching    Respiratory illness, and itchy eyes, eye problems Respiratory illness, and itchy eyes, eye problems  . Nsaids Other (See Comments)    Other reaction(s): Other (See Comments) Aleve causes hives 10/15 pt states she occ. Takes Motrin. Other reaction(s): Other (See Comments) Aleve causes hives  . Other Itching    Respiratory illness, and itchy eyes, eye problems Respiratory illness, and itchy eyes, eye problems     Current Outpatient Medications:  .  Calcium Carbonate-Vit D-Min (CALCIUM 600+D PLUS MINERALS) 600-400 MG-UNIT TABS, Take by mouth daily. , Disp: , Rfl:  .  Carboxymeth-Glyc-Polysorb PF (REFRESH OPTIVE MEGA-3) 0.5-1-0.5 % SOLN, Apply 2 drops to eye 2 (two) times daily., Disp: , Rfl:  .  levothyroxine (SYNTHROID, LEVOTHROID) 75 MCG tablet, TAKE 1 TABLET BY MOUTH DAILY, Disp: , Rfl:  .  mometasone (ELOCON) 0.1 % cream, Apply 1 application topically daily., Disp: 45 g, Rfl: 0 .  Multiple Vitamin (MULTI-VITAMINS) TABS, daily. , Disp: , Rfl:  .  Omega-3 Fatty Acids (FISH OIL) 1000 MG CAPS, Take by mouth daily., Disp: , Rfl:  .  risperiDONE (RISPERDAL) 1 MG tablet, Take 1 mg by mouth at bedtime., Disp: , Rfl:  .  triamcinolone (NASACORT  ALLERGY 24HR) 55 MCG/ACT AERO nasal inhaler, Place 2 sprays into the nose daily., Disp: , Rfl:   Review of Systems  Constitutional: Negative.   HENT: Negative.   Eyes: Negative.   Respiratory: Negative.   Cardiovascular: Negative.   Gastrointestinal: Negative.   Endocrine: Negative.   Genitourinary: Negative.   Skin: Negative.   Allergic/Immunologic: Negative.   Neurological: Negative.   Hematological: Negative.   Psychiatric/Behavioral: Negative.     Social History   Tobacco Use  . Smoking status: Never Smoker  . Smokeless tobacco: Never Used  Substance Use Topics  . Alcohol use: No      Objective:   BP 110/76 (BP Location: Left Arm, Patient Position: Sitting, Cuff Size: Normal)   Pulse 66   Temp 98 F (36.7 C) (Oral)   Ht 5\' 1"  (1.549 m)   Wt 122 lb 3.2 oz (55.4 kg)   LMP 04/10/2015 Comment: Only spotting  SpO2 98%   BMI 23.09 kg/m  Vitals:   06/11/18 1629  BP: 110/76  Pulse: 66  Temp: 98 F (36.7 C)  TempSrc: Oral  SpO2: 98%  Weight: 122 lb 3.2 oz (55.4 kg)  Height: 5\' 1"  (1.549 m)     Physical Exam Vitals signs reviewed.  Constitutional:      General: She is not in acute distress.    Appearance: Normal appearance. She is well-developed and normal weight. She  is not ill-appearing or diaphoretic.  Neck:     Musculoskeletal: Normal range of motion and neck supple.  Cardiovascular:     Rate and Rhythm: Normal rate and regular rhythm.     Heart sounds: Normal heart sounds. No murmur. No friction rub. No gallop.   Pulmonary:     Effort: Pulmonary effort is normal. No respiratory distress.     Breath sounds: Normal breath sounds. No wheezing or rales.  Skin:      Neurological:     Mental Status: She is alert.         Assessment & Plan    1. Itching One bite mark noted but not sure if it is scabies, not consistent with scabies. Will go ahead and give lindane lotion as below for patient. However patient does have delusions of insects and this  is possible this is the case again.  - lindane lotion (KWELL) 1 %; Apply 1 application topically once for 1 dose.  Dispense: 30 mL; Refill: 0     Mar Daring, PA-C  [facility]

## 2018-06-11 NOTE — Patient Instructions (Signed)
Lindane skin lotion What is this medicine? LINDANE (LIN dane) is a treatment for scabies. This medicine kills the parasites and their eggs, but it does not prevent infestations. This medicine is a poison if it is not used properly. Ask your doctor, health care professional or pharmacist to explain any information you do not understand. This medicine may be used for other purposes; ask your health care provider or pharmacist if you have questions. COMMON BRAND NAME(S): G B H, Lindane What should I tell my health care provider before I take this medicine? They need to know if you have any of these conditions: -brain or spinal tumor -burns, wounds or other damaged skin -eczema or atopic dermatitis -frequently drink alcoholic beverages -head trauma -HIV infection -liver disease -psoriasis -seizures -skin rash -an unusual or allergic reaction to lindane, other medicines, foods, dyes, or preservatives -pregnant or trying to get pregnant -breast-feeding How should I use this medicine? This medicine is for external use only. It is poisonous if taken by mouth. Follow the directions on the prescription label. Make sure you know how to properly use the lotion. Do not get this medicine in your eyes. If you do, rinse out with plenty of cool tap water. Seek medical help if the eyes are hurting. Do not use more medicine than prescribed. A special MedGuide will be given to you by the pharmacist with each prescription and refill. Be sure to read this information carefully each time. Talk to your pediatrician regarding the use of this medicine in children. While this drug may be prescribed for selected conditions, precautions do apply. Overdosage: If you think you have taken too much of this medicine contact a poison control center or emergency room at once. NOTE: This medicine is only for you. Do not share this medicine with others. What if I miss a dose? This does not apply as this medicine is applied as  a single dose. Do not repeat the treatment. What may interact with this medicine? -chloroquine, pyrimethamine -cyclosporine, mycophenolate mofetil, tacrolimus capsules -isoniazid -medicines for depression, anxiety, or psychotic disturbances -medicines to treat alzheimer's disease or myasthenia gravis -meperidine -methocarbamol -radiopaque contrast agents -some antibiotics like penicillins, imipenem and quinolones such as ciprofloxacin, levofloxacin, gatifloxacin -theophylline This list may not describe all possible interactions. Give your health care provider a list of all the medicines, herbs, non-prescription drugs, or dietary supplements you use. Also tell them if you smoke, drink alcohol, or use illegal drugs. Some items may interact with your medicine. What should I watch for while using this medicine? Let your doctor or health care professional know if scabies is still present after 1 week. Do not use this medicine more than 1 time to treat lice. Using more medicine than directed may increase your risk of serious side effects including seizures. If you or a family member are using this medicine and experience a seizure, get emergency help right away. Do not use this medicine on damaged skin. It can penetrate through skin that is burned, cut, scraped, or covered in a rash, and cause serious side effects. If you are applying this medicine to another person, wear plastic or disposable gloves to protect yourself from scabies and exposure to this medicine. All recently worn clothing, underwear, pajamas, used sheets, pillowcases, and towels should be washed in very hot water or dry-cleaned. Close personal contact can also spread the infestation. Family members and sexual contacts may require treatment for scabies. What side effects may I notice from receiving this medicine? Side  effects that you should report to your doctor or health care professional as soon as possible: -dizziness -heart  palpitations -nausea, vomiting -restlessness, nervousness, irritability -seizures -skin rash or itching that was not there before treatment -unusual sleepiness Side effects that usually do not require medical attention (report to your doctor or health care professional if they continue or are bothersome): -dry skin or scalp -itching (this can continue for 1 or more weeks after use) This list may not describe all possible side effects. Call your doctor for medical advice about side effects. You may report side effects to FDA at 1-800-FDA-1088. Where should I keep my medicine? Keep out of the reach of children. Store at room temperature between 15 and 30 degrees C (59 and 86 degrees F). Do not freeze. Keep container tightly closed. After applying the treatment, close the bottle with the leftover lotion and throw it away in a trashcan out of the reach of children. Do not keep this medicine after using it. NOTE: This sheet is a summary. It may not cover all possible information. If you have questions about this medicine, talk to your doctor, pharmacist, or health care provider.  2019 Elsevier/Gold Standard (2007-09-17 17:29:14)

## 2018-06-12 DIAGNOSIS — F209 Schizophrenia, unspecified: Secondary | ICD-10-CM | POA: Diagnosis not present

## 2018-06-13 ENCOUNTER — Encounter: Payer: Self-pay | Admitting: Physician Assistant

## 2018-06-18 ENCOUNTER — Encounter: Payer: Self-pay | Admitting: Physician Assistant

## 2018-06-18 DIAGNOSIS — F209 Schizophrenia, unspecified: Secondary | ICD-10-CM | POA: Diagnosis not present

## 2018-06-19 DIAGNOSIS — H01003 Unspecified blepharitis right eye, unspecified eyelid: Secondary | ICD-10-CM | POA: Diagnosis not present

## 2018-06-26 DIAGNOSIS — F209 Schizophrenia, unspecified: Secondary | ICD-10-CM | POA: Diagnosis not present

## 2018-06-29 ENCOUNTER — Encounter: Payer: Self-pay | Admitting: Physician Assistant

## 2018-07-10 DIAGNOSIS — F209 Schizophrenia, unspecified: Secondary | ICD-10-CM | POA: Diagnosis not present

## 2018-07-11 DIAGNOSIS — Z85850 Personal history of malignant neoplasm of thyroid: Secondary | ICD-10-CM | POA: Diagnosis not present

## 2018-07-11 DIAGNOSIS — E890 Postprocedural hypothyroidism: Secondary | ICD-10-CM | POA: Diagnosis not present

## 2018-07-16 ENCOUNTER — Encounter: Payer: Self-pay | Admitting: Physician Assistant

## 2018-07-16 DIAGNOSIS — M62838 Other muscle spasm: Secondary | ICD-10-CM

## 2018-07-18 MED ORDER — CYCLOBENZAPRINE HCL 5 MG PO TABS: 5.0000 mg | ORAL_TABLET | Freq: Every evening | ORAL | 1 refills | Status: DC | PRN

## 2018-07-24 DIAGNOSIS — F209 Schizophrenia, unspecified: Secondary | ICD-10-CM | POA: Diagnosis not present

## 2018-08-07 DIAGNOSIS — F209 Schizophrenia, unspecified: Secondary | ICD-10-CM | POA: Diagnosis not present

## 2018-08-07 DIAGNOSIS — F324 Major depressive disorder, single episode, in partial remission: Secondary | ICD-10-CM | POA: Diagnosis not present

## 2018-08-08 ENCOUNTER — Encounter: Payer: Self-pay | Admitting: Physician Assistant

## 2018-08-22 DIAGNOSIS — F209 Schizophrenia, unspecified: Secondary | ICD-10-CM | POA: Diagnosis not present

## 2018-08-22 DIAGNOSIS — F324 Major depressive disorder, single episode, in partial remission: Secondary | ICD-10-CM | POA: Diagnosis not present

## 2018-08-28 ENCOUNTER — Encounter: Payer: Self-pay | Admitting: Physician Assistant

## 2018-08-28 DIAGNOSIS — F209 Schizophrenia, unspecified: Secondary | ICD-10-CM | POA: Diagnosis not present

## 2018-08-28 DIAGNOSIS — F324 Major depressive disorder, single episode, in partial remission: Secondary | ICD-10-CM | POA: Diagnosis not present

## 2018-08-28 DIAGNOSIS — G8929 Other chronic pain: Secondary | ICD-10-CM

## 2018-08-28 DIAGNOSIS — M545 Low back pain, unspecified: Secondary | ICD-10-CM

## 2018-09-04 DIAGNOSIS — F209 Schizophrenia, unspecified: Secondary | ICD-10-CM | POA: Diagnosis not present

## 2018-09-04 DIAGNOSIS — F324 Major depressive disorder, single episode, in partial remission: Secondary | ICD-10-CM | POA: Diagnosis not present

## 2018-09-05 DIAGNOSIS — E890 Postprocedural hypothyroidism: Secondary | ICD-10-CM | POA: Diagnosis not present

## 2018-09-13 DIAGNOSIS — F209 Schizophrenia, unspecified: Secondary | ICD-10-CM | POA: Diagnosis not present

## 2018-09-13 DIAGNOSIS — F324 Major depressive disorder, single episode, in partial remission: Secondary | ICD-10-CM | POA: Diagnosis not present

## 2018-09-18 DIAGNOSIS — F209 Schizophrenia, unspecified: Secondary | ICD-10-CM | POA: Diagnosis not present

## 2018-09-18 DIAGNOSIS — F324 Major depressive disorder, single episode, in partial remission: Secondary | ICD-10-CM | POA: Diagnosis not present

## 2018-09-26 ENCOUNTER — Telehealth: Payer: Self-pay | Admitting: Physician Assistant

## 2018-09-26 NOTE — Chronic Care Management (AMB) (Signed)
°  Chronic Care Management   Outreach Note  09/26/2018 Name: Diamond Lucas MRN: [id number] DOB: 1960/06/29  Referred by: Mar Daring, PA-C Reason for referral : Chronic Care Management (Initial CCM outreach was unsuccessful. )   An unsuccessful telephone outreach was attempted today. The patient was referred to the case management team by for assistance with chronic care management and care coordination.   Follow Up Plan: A HIPPA compliant phone message was left for the patient providing contact information and requesting a return call.  The care management team will reach out to the patient again over the next 7 days.  If patient returns call to provider office, please advise to call [employer] at [place]  [email or web address]   ??[phone]

## 2018-09-27 NOTE — Chronic Care Management (AMB) (Signed)
Chronic Care Management   Note  09/27/2018 Name: Diamond Lucas MRN: [id number] DOB: 1961/01/17  Diamond Lucas is a 58 y.o. year old female who is a primary care patient of Mar Daring, [place]. I reached out to [name] by phone today in response to a referral sent by [name]'s health plan.    [name] was given information about Chronic Care Management services today including:  1. CCM service includes personalized support from designated clinical staff supervised by her physician, including individualized plan of care and coordination with other care providers 2. 24/7 contact phone numbers for assistance for urgent and routine care needs. 3. Service will only be billed when office clinical staff spend 20 minutes or more in a month to coordinate care. 4. Only one practitioner may furnish and bill the service in a calendar month. 5. The patient may stop CCM services at any time (effective at the end of the month) by phone call to the office staff. 6. The patient will be responsible for cost sharing (co-pay) of up to 20% of the service fee (after annual deductible is met).  Patient did not agree to enrollment in care management services and does not wish to consider at this time.  Follow up plan: The patient has been provided with contact information for the chronic care management team and has been advised to call with any health related questions or concerns.   [place]  [email or web address]   ??[phone]

## 2018-09-27 NOTE — Chronic Care Management (AMB) (Signed)
°  Chronic Care Management   Outreach Note  09/27/2018 Name: Diamond Lucas MRN: [id number] DOB: 1960-08-03  Referred by: Mar Daring, PA-C Reason for referral : Chronic Care Management (Initial CCM outreach was unsuccessful.) and Chronic Care Management (Second CCm outreach was unsuccessful. )   A second unsuccessful telephone outreach was attempted today. The patient was referred to the case management team for assistance with chronic care management and care coordination.   Follow Up Plan: A HIPPA compliant phone message was left for the patient providing contact information and requesting a return call.  The care management team will reach out to the patient again over the next 7 days.  If patient returns call to provider office, please advise to call [facility] at [place]  [email or web address]   ??[phone]

## 2018-10-03 DIAGNOSIS — F324 Major depressive disorder, single episode, in partial remission: Secondary | ICD-10-CM | POA: Diagnosis not present

## 2018-10-03 DIAGNOSIS — F209 Schizophrenia, unspecified: Secondary | ICD-10-CM | POA: Diagnosis not present

## 2018-10-25 DIAGNOSIS — F324 Major depressive disorder, single episode, in partial remission: Secondary | ICD-10-CM | POA: Diagnosis not present

## 2018-10-25 DIAGNOSIS — F209 Schizophrenia, unspecified: Secondary | ICD-10-CM | POA: Diagnosis not present

## 2018-10-26 DIAGNOSIS — F209 Schizophrenia, unspecified: Secondary | ICD-10-CM | POA: Diagnosis not present

## 2018-10-26 DIAGNOSIS — F324 Major depressive disorder, single episode, in partial remission: Secondary | ICD-10-CM | POA: Diagnosis not present

## 2018-10-29 DIAGNOSIS — F209 Schizophrenia, unspecified: Secondary | ICD-10-CM | POA: Diagnosis not present

## 2018-10-29 DIAGNOSIS — F324 Major depressive disorder, single episode, in partial remission: Secondary | ICD-10-CM | POA: Diagnosis not present

## 2018-11-01 ENCOUNTER — Encounter: Payer: Self-pay | Admitting: Physician Assistant

## 2018-11-08 DIAGNOSIS — F209 Schizophrenia, unspecified: Secondary | ICD-10-CM | POA: Diagnosis not present

## 2018-11-08 DIAGNOSIS — F324 Major depressive disorder, single episode, in partial remission: Secondary | ICD-10-CM | POA: Diagnosis not present

## 2018-11-22 DIAGNOSIS — F324 Major depressive disorder, single episode, in partial remission: Secondary | ICD-10-CM | POA: Diagnosis not present

## 2018-11-22 DIAGNOSIS — F209 Schizophrenia, unspecified: Secondary | ICD-10-CM | POA: Diagnosis not present

## 2018-11-29 ENCOUNTER — Encounter: Payer: Self-pay | Admitting: Physician Assistant

## 2018-12-06 DIAGNOSIS — F209 Schizophrenia, unspecified: Secondary | ICD-10-CM | POA: Diagnosis not present

## 2018-12-06 DIAGNOSIS — F324 Major depressive disorder, single episode, in partial remission: Secondary | ICD-10-CM | POA: Diagnosis not present

## 2018-12-16 IMAGING — CR DG KNEE COMPLETE 4+V*R*
5 series · 5 of 5 positions shown · non-contrast
Comparison: None.

CLINICAL DATA: Right knee pain for several months.

EXAM:
RIGHT KNEE - COMPLETE 4+ VIEW

[knee ap]
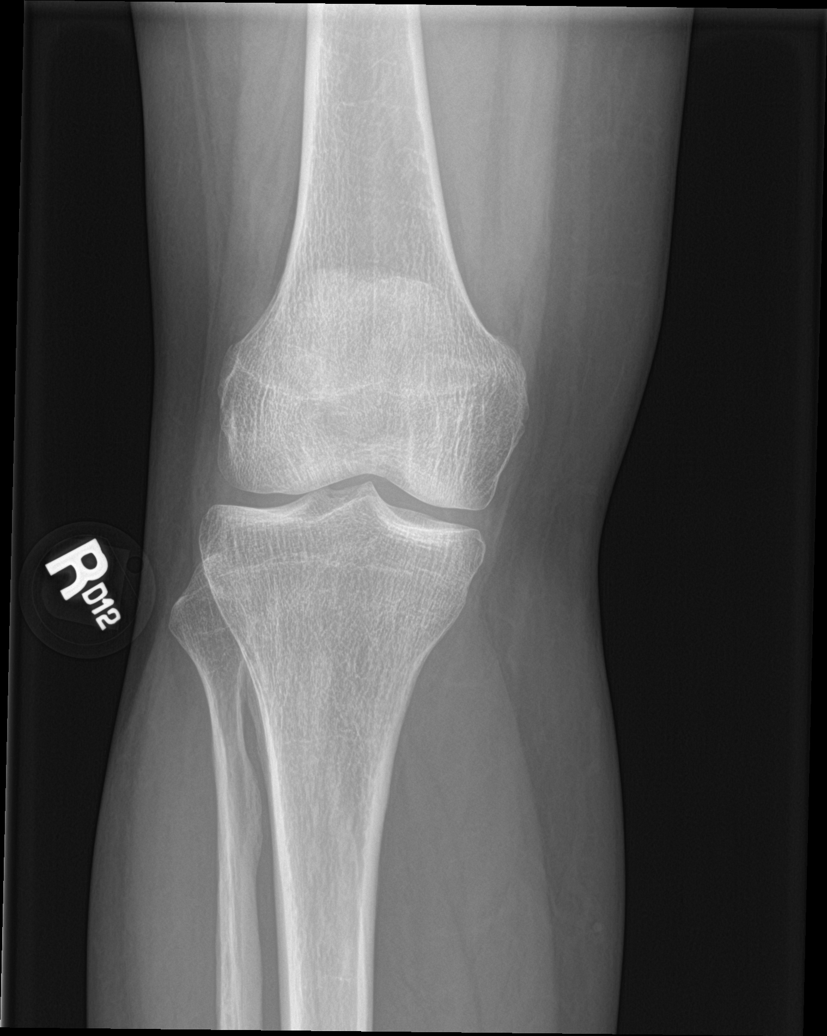

[knee obl (1 of 2)]
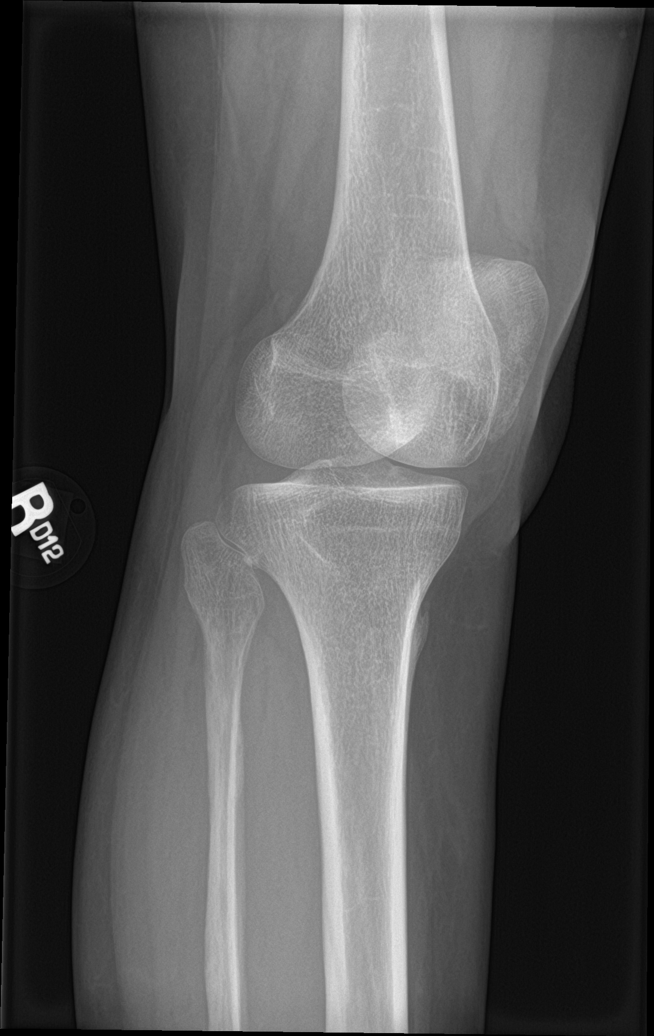

[knee obl (2 of 2)]
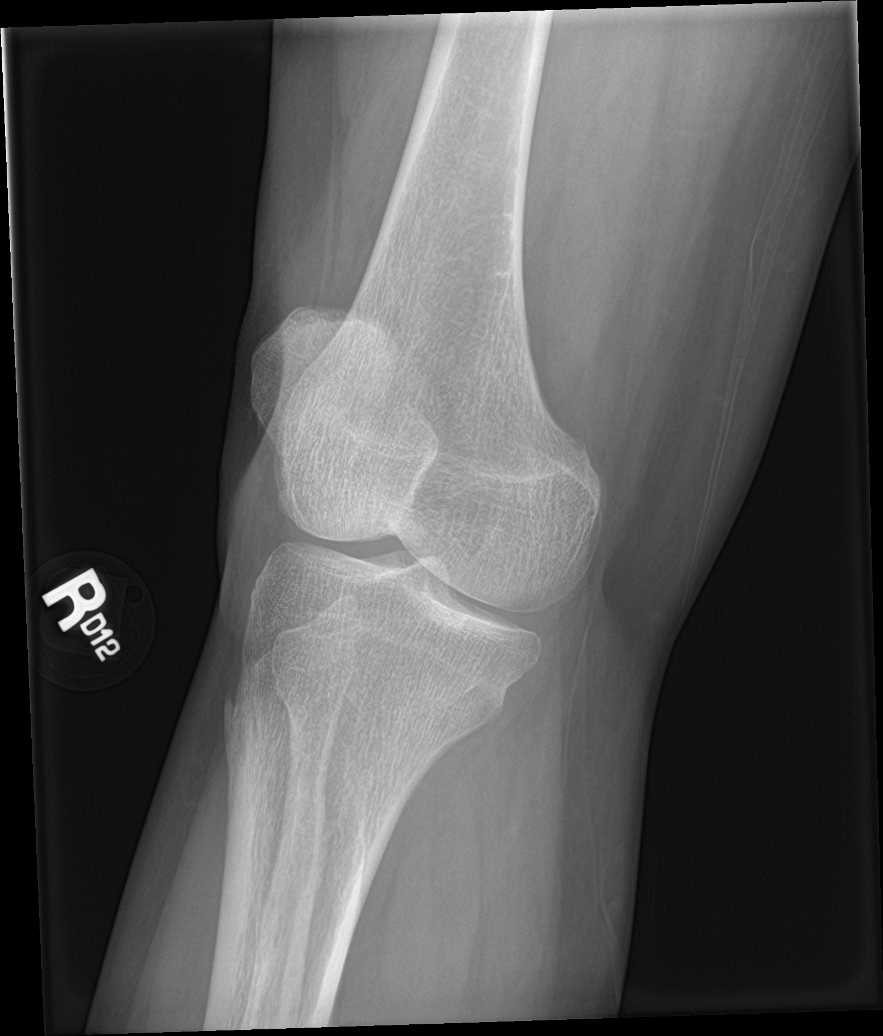

[knee lat]
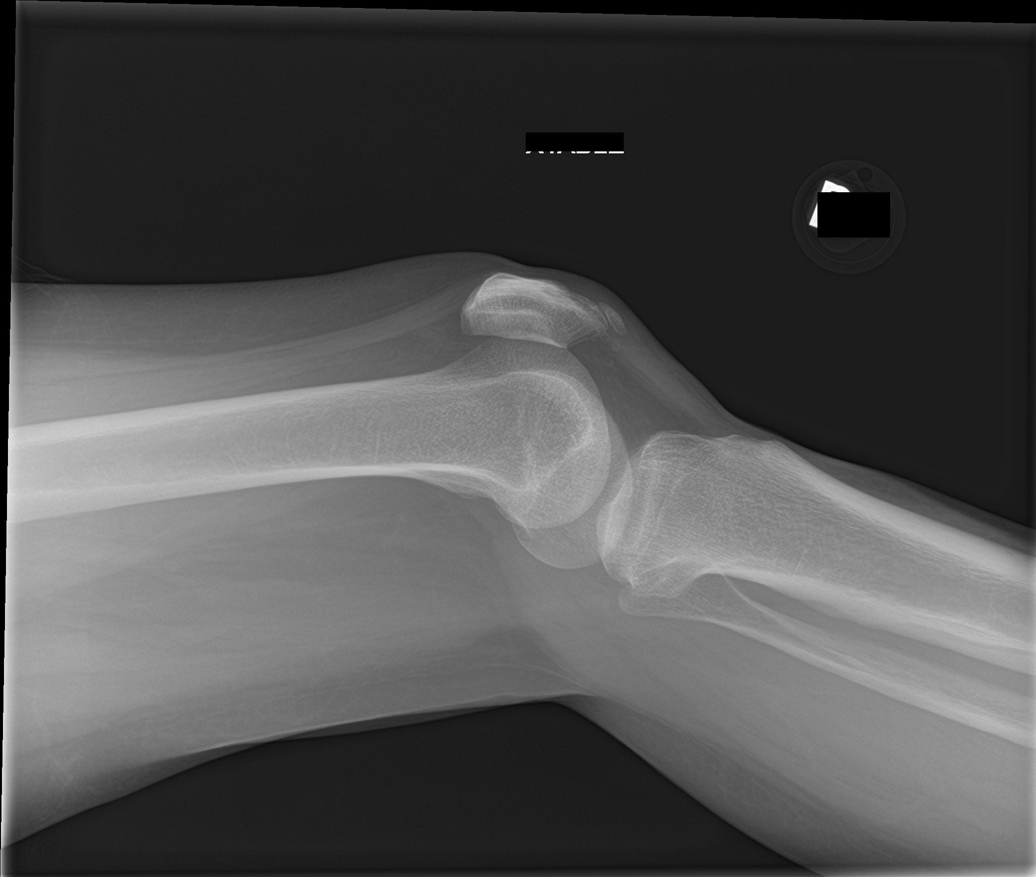

[sunrise]
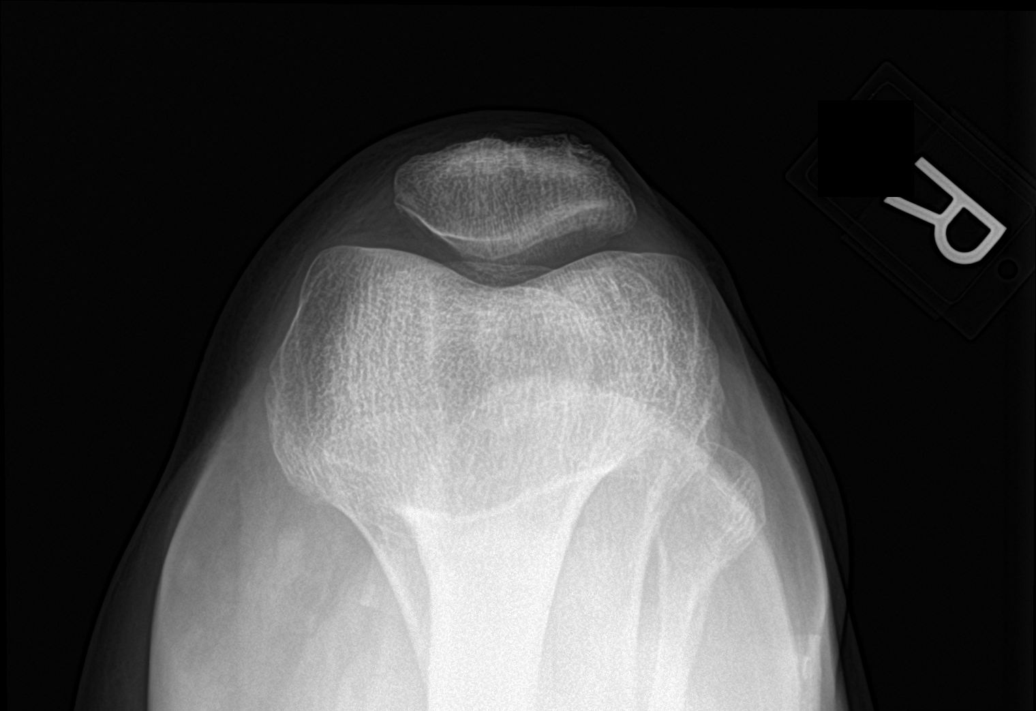

[5 of 5 positions shown; findings below may reference images not displayed]

FINDINGS: No evidence of fracture, dislocation, or joint effusion. No evidence
of arthropathy or other focal bone abnormality. Soft tissues are
unremarkable.
IMPRESSION: Negative.

## 2018-12-21 DIAGNOSIS — F324 Major depressive disorder, single episode, in partial remission: Secondary | ICD-10-CM | POA: Diagnosis not present

## 2018-12-21 DIAGNOSIS — F209 Schizophrenia, unspecified: Secondary | ICD-10-CM | POA: Diagnosis not present

## 2018-12-25 ENCOUNTER — Encounter: Payer: Self-pay | Admitting: Physician Assistant

## 2019-01-04 ENCOUNTER — Ambulatory Visit: Payer: Medicare Other | Admitting: Physician Assistant

## 2019-01-07 DIAGNOSIS — F324 Major depressive disorder, single episode, in partial remission: Secondary | ICD-10-CM | POA: Diagnosis not present

## 2019-01-07 DIAGNOSIS — F209 Schizophrenia, unspecified: Secondary | ICD-10-CM | POA: Diagnosis not present

## 2019-01-14 ENCOUNTER — Encounter: Payer: Self-pay | Admitting: Physician Assistant

## 2019-01-21 ENCOUNTER — Ambulatory Visit (INDEPENDENT_AMBULATORY_CARE_PROVIDER_SITE_OTHER): Payer: Medicare Other | Admitting: Physician Assistant

## 2019-01-21 ENCOUNTER — Other Ambulatory Visit: Payer: Self-pay

## 2019-01-21 ENCOUNTER — Encounter: Payer: Self-pay | Admitting: Physician Assistant

## 2019-01-21 VITALS — BP 128/82 | HR 66 | Temp 97.3°F | Resp 16 | Wt 124.0 lb

## 2019-01-21 DIAGNOSIS — Z1239 Encounter for other screening for malignant neoplasm of breast: Secondary | ICD-10-CM

## 2019-01-21 DIAGNOSIS — Z8701 Personal history of pneumonia (recurrent): Secondary | ICD-10-CM

## 2019-01-21 DIAGNOSIS — Z23 Encounter for immunization: Secondary | ICD-10-CM | POA: Diagnosis not present

## 2019-01-21 DIAGNOSIS — M542 Cervicalgia: Secondary | ICD-10-CM | POA: Diagnosis not present

## 2019-01-21 DIAGNOSIS — F312 Bipolar disorder, current episode manic severe with psychotic features: Secondary | ICD-10-CM

## 2019-01-21 DIAGNOSIS — N393 Stress incontinence (female) (male): Secondary | ICD-10-CM | POA: Diagnosis not present

## 2019-01-21 DIAGNOSIS — F22 Delusional disorders: Secondary | ICD-10-CM

## 2019-01-21 LAB — POCT URINALYSIS DIPSTICK
Appearance: NORMAL
Bilirubin, UA: NEGATIVE
Blood, UA: NEGATIVE
Glucose, UA: NEGATIVE
Ketones, UA: NEGATIVE
Leukocytes, UA: NEGATIVE
Nitrite, UA: NEGATIVE
Protein, UA: NEGATIVE
Spec Grav, UA: <=1.005 — AB (ref 1.010–1.025)
Urobilinogen, UA: 0.2 E.U./dL
pH, UA: 6 (ref 5.0–8.0)

## 2019-01-21 MED ORDER — BACLOFEN 10 MG PO TABS: 10.0000 mg | ORAL_TABLET | Freq: Three times a day (TID) | ORAL | 0 refills | Status: DC

## 2019-01-21 NOTE — Patient Instructions (Signed)
Kegel Exercises  Kegel exercises can help strengthen your pelvic floor muscles. The pelvic floor is a group of muscles that support your rectum, small intestine, and bladder. In females, pelvic floor muscles also help support the womb (uterus). These muscles help you control the flow of urine and stool. Kegel exercises are painless and simple, and they do not require any equipment. Your provider may suggest Kegel exercises to:  Improve bladder and bowel control.  Improve sexual response.  Improve weak pelvic floor muscles after surgery to remove the uterus (hysterectomy) or pregnancy (females).  Improve weak pelvic floor muscles after prostate gland removal or surgery (males). Kegel exercises involve squeezing your pelvic floor muscles, which are the same muscles you squeeze when you try to stop the flow of urine or keep from passing gas. The exercises can be done while sitting, standing, or lying down, but it is best to vary your position. Exercises How to do Kegel exercises: 1. Squeeze your pelvic floor muscles tight. You should feel a tight lift in your rectal area. If you are a female, you should also feel a tightness in your vaginal area. Keep your stomach, buttocks, and legs relaxed. 2. Hold the muscles tight for up to 10 seconds. 3. Breathe normally. 4. Relax your muscles. 5. Repeat as told by your health care provider. Repeat this exercise daily as told by your health care provider. Continue to do this exercise for at least 4-6 weeks, or for as long as told by your health care provider. You may be referred to a physical therapist who can help you learn more about how to do Kegel exercises. Depending on your condition, your health care provider may recommend:  Varying how long you squeeze your muscles.  Doing several sets of exercises every day.  Doing exercises for several weeks.  Making Kegel exercises a part of your regular exercise routine. This information is not intended  to replace advice given to you by your health care provider. Make sure you discuss any questions you have with your health care provider. Document Released: 02/29/2012 Document Revised: 11/01/2017 Document Reviewed: 11/01/2017 Elsevier Patient Education  [place].   Baclofen tablets What is this medicine? BACLOFEN (BAK loe fen) helps relieve spasms and cramping of muscles. It may be used to treat symptoms of multiple sclerosis or spinal cord injury. This medicine may be used for other purposes; ask your health care provider or pharmacist if you have questions. COMMON BRAND NAME(S): ED Baclofen, Lioresal What should I tell my health care provider before I take this medicine? They need to know if you have any of these conditions:  kidney disease  seizures  stroke  an unusual or allergic reaction to baclofen, other medicines, foods, dyes, or preservatives  pregnant or trying to get pregnant  breast-feeding How should I use this medicine? Take this medicine by mouth. Swallow it with a drink of water. Follow the directions on the prescription label. Do not take more medicine than you are told to take. Talk to your pediatrician regarding the use of this medicine in children. Special care may be needed. Overdosage: If you think you have taken too much of this medicine contact a poison control center or emergency room at once. NOTE: This medicine is only for you. Do not share this medicine with others. What if I miss a dose? If you miss a dose, take it as soon as you can. If it is almost time for your next dose, take only that  dose. Do not take double or extra doses. What may interact with this medicine? Do not take this medication with any of the following medicines:  narcotic medicines for cough This medicine may also interact with the following medications:  alcohol  antihistamines for allergy, cough and cold  certain medicines for anxiety or sleep  certain medicines  for depression like amitriptyline, fluoxetine, sertraline  certain medicines for seizures like phenobarbital, primidone  general anesthetics like halothane, isoflurane, methoxyflurane, propofol  local anesthetics like lidocaine, pramoxine, tetracaine  medicines that relax muscles for surgery  narcotic medicines for pain  phenothiazines like chlorpromazine, mesoridazine, prochlorperazine, thioridazine This list may not describe all possible interactions. Give your health care provider a list of all the medicines, herbs, non-prescription drugs, or dietary supplements you use. Also tell them if you smoke, drink alcohol, or use illegal drugs. Some items may interact with your medicine. What should I watch for while using this medicine? Tell your doctor or health care professional if your symptoms do not start to get better or if they get worse. Do not suddenly stop taking your medicine. If you do, you may develop a severe reaction. If your doctor wants you to stop the medicine, the dose will be slowly lowered over time to avoid any side effects. Follow the advice of your doctor. You may get drowsy or dizzy. Do not drive, use machinery, or do anything that needs mental alertness until you know how this medicine affects you. Do not stand or sit up quickly, especially if you are an older patient. This reduces the risk of dizzy or fainting spells. Alcohol may interfere with the effect of this medicine. Avoid alcoholic drinks. If you are taking another medicine that also causes drowsiness, you may have more side effects. Give your health care provider a list of all medicines you use. Your doctor will tell you how much medicine to take. Do not take more medicine than directed. Call emergency for help if you have problems breathing or unusual sleepiness. What side effects may I notice from receiving this medicine? Side effects that you should report to your doctor or health care professional as soon as  possible:  allergic reactions like skin rash, itching or hives, swelling of the face, lips, or tongue  breathing problems  changes in emotions or moods  changes in vision  chest pain  fast, irregular heartbeat  feeling faint or lightheaded, falls  hallucinations  loss of balance or coordination  ringing of the ears  seizures  trouble passing urine or change in the amount of urine  trouble walking  unusually weak or tired Side effects that usually do not require medical attention (report to your doctor or health care professional if they continue or are bothersome):  changes in taste  confusion  constipation  diarrhea  dry mouth  headache  muscle weakness  nausea, vomiting  trouble sleeping This list may not describe all possible side effects. Call your doctor for medical advice about side effects. You may report side effects to FDA at 1-800-FDA-1088. Where should I keep my medicine? Keep out of the reach of children. Store at room temperature between 15 and 30 degrees C (59 and 86 degrees F). Keep container tightly closed. Throw away any unused medicine after the expiration date. NOTE: This sheet is a summary. It may not cover all possible information. If you have questions about this medicine, talk to your doctor, pharmacist, or health care provider.  2020 Elsevier/Gold Standard (2016-12-24 09:56:42)

## 2019-01-21 NOTE — Progress Notes (Signed)
Patient: Diamond Lucas Female    DOB: 1960/06/05   58 y.o.   MRN: [id number] Visit Date: 02/06/2019  Today's Provider: Mar Daring, PA-C   Chief Complaint  Patient presents with  . Neck Pain   Subjective:     HPI  Patient here today c/o neck pain on right side on and off x's several years. Patient reports she has been taking Flexeril. Patient reports flexeril makes her very drowsy. Patient reports pain radiates to her right arm. Patient reports that cold weather makes her pain worse. Flexeril does help her symptoms and make it better, however, the drowsiness limits when she can take it.   Patient c/o incontinence for months. Patient reports that she wears a panty liner daily.   Allergies  Allergen Reactions  . Diclofenac Sodium Palpitations  . Naproxen Itching, Rash and Hives  . Olanzapine     Other reaction(s): Other Cross-eyed and hyperventilation  . Gluten Meal Other (See Comments)    Abdominal bloating  . Mite (D. Farinae) Itching    Respiratory illness, and itchy eyes, eye problems Respiratory illness, and itchy eyes, eye problems  . Nsaids Other (See Comments)    Other reaction(s): Other (See Comments) Aleve causes hives 10/15 pt states she occ. Takes Motrin. Other reaction(s): Other (See Comments) Aleve causes hives  . Other Itching    Respiratory illness, and itchy eyes, eye problems Respiratory illness, and itchy eyes, eye problems     Current Outpatient Medications:  .  Calcium Carbonate-Vit D-Min (CALCIUM 600+D PLUS MINERALS) 600-400 MG-UNIT TABS, Take by mouth daily. , Disp: , Rfl:  .  Carboxymeth-Glyc-Polysorb PF (REFRESH OPTIVE MEGA-3) 0.5-1-0.5 % SOLN, Apply 2 drops to eye 2 (two) times daily., Disp: , Rfl:  .  levothyroxine (SYNTHROID, LEVOTHROID) 75 MCG tablet, TAKE 1 TABLET BY MOUTH DAILY, Disp: , Rfl:  .  Multiple Vitamin (MULTI-VITAMINS) TABS, daily. , Disp: , Rfl:  .  Omega-3 Fatty Acids (FISH OIL) 1000 MG CAPS, Take by mouth  daily., Disp: , Rfl:  .  risperiDONE (RISPERDAL) 1 MG tablet, Take 1 mg by mouth at bedtime., Disp: , Rfl:  .  triamcinolone (NASACORT ALLERGY 24HR) 55 MCG/ACT AERO nasal inhaler, Place 2 sprays into the nose daily., Disp: , Rfl:  .  baclofen (LIORESAL) 10 MG tablet, Take 1 tablet (10 mg total) by mouth 3 (three) times daily., Disp: 30 each, Rfl: 0  Review of Systems  Constitutional: Negative.   Respiratory: Negative.   Cardiovascular: Negative.   Genitourinary: Positive for frequency.  Musculoskeletal: Positive for arthralgias, myalgias, neck pain and neck stiffness.  Neurological: Negative.     Social History   Tobacco Use  . Smoking status: Never Smoker  . Smokeless tobacco: Never Used  Substance Use Topics  . Alcohol use: No      Objective:   BP 128/82 (BP Location: Left Arm, Patient Position: Sitting, Cuff Size: Normal)   Pulse 66   Temp (!) 97.3 F (36.3 C) (Temporal)   Resp 16   Wt 124 lb (56.2 kg)   LMP 04/10/2015 Comment: Only spotting  BMI 23.43 kg/m  Vitals:   01/21/19 1420  BP: 128/82  Pulse: 66  Resp: 16  Temp: (!) 97.3 F (36.3 C)  TempSrc: Temporal  Weight: 124 lb (56.2 kg)  Body mass index is 23.43 kg/m.   Physical Exam Vitals signs reviewed.  Constitutional:      General: She is not in acute distress.  Appearance: Normal appearance. She is well-developed and normal weight. She is not ill-appearing or diaphoretic.  Neck:     Musculoskeletal: Normal range of motion and neck supple. Normal range of motion. Muscular tenderness present. No neck rigidity, torticollis or spinous process tenderness.     Thyroid: No thyromegaly.     Vascular: No JVD.     Trachea: No tracheal deviation.   Cardiovascular:     Rate and Rhythm: Normal rate and regular rhythm.     Heart sounds: Normal heart sounds. No murmur. No friction rub. No gallop.   Pulmonary:     Effort: Pulmonary effort is normal. No respiratory distress.     Breath sounds: Normal breath  sounds. No wheezing or rales.  Lymphadenopathy:     Cervical: No cervical adenopathy.  Neurological:     Mental Status: She is alert.      Results for orders placed or performed in visit on 01/21/19  POCT Urinalysis Dipstick  Result Value Ref Range   Color, UA pale yellow    Clarity, UA clear    Glucose, UA Negative Negative   Bilirubin, UA negative    Ketones, UA negative    Spec Grav, UA <=1.005 (A) 1.010 - 1.025   Blood, UA negative    pH, UA 6.0 5.0 - 8.0   Protein, UA Negative Negative   Urobilinogen, UA 0.2 0.2 or 1.0 E.U./dL   Nitrite, UA negative    Leukocytes, UA Negative Negative   Appearance normal    Odor none        Assessment & Plan    1. Neck pain on right side Will change therapy from flexeril to baclofen as below. Continue heat when aggravated. Discussed massage therapy to help lessen recurrence. - baclofen (LIORESAL) 10 MG tablet; Take 1 tablet (10 mg total) by mouth 3 (three) times daily.  Dispense: 30 each; Refill: 0  2. Stress incontinence UA normal. Discussed options from doing nothing, medication options, pelvic floor therapy, pessary and more invasive interventions. Discussed avoiding caffeine and alcohol. At this time she prefers to wait and do nothing. Will call if worsening.  - POCT Urinalysis Dipstick  3. Delusional disorder, somatic type ([place]) Stable. Continue medications as directed by [facility].   4. Bipolar I disorder, most recent episode manic, severe with psychotic features ([employer]) See above medical treatment plan.  5. Encounter for breast cancer screening using non-mammogram modality Breast exam today was normal. There is no family history of breast cancer. She does perform regular self breast exams. Mammogram was ordered as below. Information for [facility] clinic was given to patient so she may schedule her mammogram at her convenience. - MM 3D SCREEN BREAST BILATERAL; Future  6. History of pneumonia Pneumococcal 23 Vaccine given to  patient without complications. Patient sat for 15 minutes after administration and was tolerated well without adverse effects. - Pneumococcal polysaccharide vaccine 23-valent greater than or equal to 2yo subcutaneous/IM  7. Need for pneumococcal vaccination See above medical treatment plan. - Pneumococcal polysaccharide vaccine 23-valent greater than or equal to 2yo subcutaneous/IM     Mar Daring, PA-C  [facility]

## 2019-01-24 DIAGNOSIS — F324 Major depressive disorder, single episode, in partial remission: Secondary | ICD-10-CM | POA: Diagnosis not present

## 2019-01-24 DIAGNOSIS — F209 Schizophrenia, unspecified: Secondary | ICD-10-CM | POA: Diagnosis not present

## 2019-02-01 ENCOUNTER — Encounter: Payer: Self-pay | Admitting: Physician Assistant

## 2019-02-15 DIAGNOSIS — F324 Major depressive disorder, single episode, in partial remission: Secondary | ICD-10-CM | POA: Diagnosis not present

## 2019-02-15 DIAGNOSIS — F209 Schizophrenia, unspecified: Secondary | ICD-10-CM | POA: Diagnosis not present

## 2019-03-08 DIAGNOSIS — F324 Major depressive disorder, single episode, in partial remission: Secondary | ICD-10-CM | POA: Diagnosis not present

## 2019-03-08 DIAGNOSIS — F209 Schizophrenia, unspecified: Secondary | ICD-10-CM | POA: Diagnosis not present

## 2019-03-22 ENCOUNTER — Encounter: Payer: Self-pay | Admitting: Physician Assistant

## 2019-04-01 ENCOUNTER — Encounter: Payer: Self-pay | Admitting: Physician Assistant

## 2019-04-09 ENCOUNTER — Encounter: Payer: Self-pay | Admitting: Obstetrics and Gynecology

## 2019-04-09 ENCOUNTER — Ambulatory Visit (INDEPENDENT_AMBULATORY_CARE_PROVIDER_SITE_OTHER): Payer: Medicare Other | Admitting: Obstetrics and Gynecology

## 2019-04-09 ENCOUNTER — Other Ambulatory Visit (HOSPITAL_COMMUNITY)
Admission: RE | Admit: 2019-04-09 | Discharge: 2019-04-09 | Disposition: A | Discharge status: Home / Self Care | Payer: Medicare Other | Source: Ambulatory Visit | Attending: Obstetrics and Gynecology | Admitting: Obstetrics and Gynecology

## 2019-04-09 ENCOUNTER — Other Ambulatory Visit: Payer: Self-pay

## 2019-04-09 ENCOUNTER — Encounter: Payer: Self-pay | Admitting: Physician Assistant

## 2019-04-09 VITALS — BP 141/88 | HR 75 | Ht 61.0 in | Wt 126.2 lb

## 2019-04-09 DIAGNOSIS — Z01411 Encounter for gynecological examination (general) (routine) with abnormal findings: Secondary | ICD-10-CM | POA: Insufficient documentation

## 2019-04-09 DIAGNOSIS — R87810 Cervical high risk human papillomavirus (HPV) DNA test positive: Secondary | ICD-10-CM | POA: Insufficient documentation

## 2019-04-09 DIAGNOSIS — F312 Bipolar disorder, current episode manic severe with psychotic features: Secondary | ICD-10-CM

## 2019-04-09 DIAGNOSIS — E890 Postprocedural hypothyroidism: Secondary | ICD-10-CM

## 2019-04-09 DIAGNOSIS — E785 Hyperlipidemia, unspecified: Secondary | ICD-10-CM

## 2019-04-09 DIAGNOSIS — Z1151 Encounter for screening for human papillomavirus (HPV): Secondary | ICD-10-CM | POA: Diagnosis not present

## 2019-04-09 DIAGNOSIS — Z124 Encounter for screening for malignant neoplasm of cervix: Secondary | ICD-10-CM | POA: Diagnosis not present

## 2019-04-09 DIAGNOSIS — F458 Other somatoform disorders: Secondary | ICD-10-CM

## 2019-04-09 DIAGNOSIS — Z01419 Encounter for gynecological examination (general) (routine) without abnormal findings: Secondary | ICD-10-CM | POA: Diagnosis present

## 2019-04-09 DIAGNOSIS — Z5320 Procedure and treatment not carried out because of patient's decision for unspecified reasons: Secondary | ICD-10-CM

## 2019-04-09 NOTE — Progress Notes (Signed)
Pt present for annual exam. 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 Pt had flu vaccine 12/2018.

## 2019-04-09 NOTE — Progress Notes (Signed)
ANNUAL PREVENTATIVE CARE GYNECOLOGY  ENCOUNTER NOTE  Subjective:       Diamond Lucas is a 60 y.o. G0P0000 female here for a routine annual gynecologic exam. She has a history of delusional disorder, bipolar I disorder, and hypothyroidism. The patient is not sexually active. She is not taking hormone replacement therapy. Patient denies post-menopausal vaginal bleeding. The patient wears seatbelts: yes. The patient participates in regular exercise: no. ` Current complaints: 1.  Has migraine headaches 2-3 times per month, sees "gold floaters". Tylenol and lying down in the dark help some.  She notes her PCP tried to put her on a medication but she did not take it, can't remember the name.  Also notes that she did not take take the Baclofen that was prescribed as she was afraid to "because it was an opiod and she did not want to become addicted".  2. Also notes that she thinks that the "kids put cat sperm inside of her apartment, and she heard a "meow", and then the following day she was pregnant with a   a saber-tooth tiger inside of her".  She reports that the bat babies are also still inside of her uterus, waiting on when they can be delivered.  She is concerned about the saber-tooth tiger being inside with the bat babies as it may hurt them.      Gynecologic History Patient's last menstrual period was 04/10/2015. Contraception: post menopausal status Last Pap: patient cannot recall (maybe 2-3 years ago). Results were: normal per pt Last mammogram: Declines.  Notes that she does not desire to get a mammogram as she is fearful of the radiation.  Last Colonoscopy: 02/08/2018. Several benign (hyperplastic) polyps noted. Can repeat in 10 years.  Last Dexa Scan: Never had one.    Obstetric History OB History  Gravida Para Term Preterm AB Living  0 0 0 0 0 0  SAB TAB Ectopic Multiple Live Births  0 0 0 0      Past Medical History:  Diagnosis Date  . Allergy   . Arrhythmia   .  History of chicken pox   . History of measles   . History of syncope   . Osteoarthritis   . Rosacea   . Schizophrenia ([place])   . Thyroid disease     Family History  Problem Relation Age of Onset  . Depression Mother   . Cataracts Mother   . Stroke Mother        mini  . Cancer Other   . Heart failure Other   . Heart disease Other   . Stroke Other   . Diabetes Other   . Kidney cancer Father   . Anxiety disorder Brother     Past Surgical History:  Procedure Laterality Date  . COLONOSCOPY WITH PROPOFOL N/A 02/08/2018   Procedure: COLONOSCOPY WITH PROPOFOL;  Surgeon: Virgel Manifold, MD;  Location: [facility] ENDOSCOPY;  Service: Endoscopy;  Laterality: N/A;  . FOOT SURGERY Right   . THYROIDECTOMY      Social History   Socioeconomic History  . Marital status: Single    Spouse name: Not on file  . Number of children: 0  . Years of education: Not on file  . Highest education level: Bachelor's degree (e.g., BA, AB, BS)  Occupational History  . 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 Smoking status: Never Smoker  . Smokeless tobacco: Never Used  Substance and Sexual Activity  . Alcohol use: No  . Drug use: No  . Sexual activity: Not Currently    Birth control/protection: Post-menopausal, Abstinence  Other Topics Concern  . Not on file  Social History Narrative  . Not on file   Social Determinants of Health   Financial Resource Strain:   . Difficulty of Paying Living Expenses: Not on file  Food Insecurity:   . Worried About Charity fundraiser in the Last Year: Not on file  . Ran Out of Food in the Last Year: Not on file  Transportation Needs:   . Lack of Transportation (Medical): Not on file  . Lack of Transportation (Non-Medical): Not on file  Physical Activity:   . Days of Exercise per Week: Not on file  . Minutes of Exercise per Session: Not on file  Stress:   . Feeling of Stress : Not on file   Social Connections:   . Frequency of Communication with Friends and Family: Not on file  . Frequency of Social Gatherings with Friends and Family: Not on file  . Attends Religious Services: Not on file  . Active Member of Clubs or Organizations: Not on file  . Attends Archivist Meetings: Not on file  . Marital Status: Not on file  Intimate Partner Violence:   . Fear of Current or Ex-Partner: Not on file  . Emotionally Abused: Not on file  . Physically Abused: Not on file  . Sexually Abused: Not on file    Current Outpatient Medications on File Prior to Visit  Medication Sig Dispense Refill  . Calcium Carbonate-Vit D-Min (CALCIUM 600+D PLUS MINERALS) 600-400 MG-UNIT TABS Take by mouth daily.     . Carboxymeth-Glyc-Polysorb PF (REFRESH OPTIVE MEGA-3) 0.5-1-0.5 % SOLN Apply 2 drops to eye 2 (two) times daily.    Marland Kitchen levothyroxine (SYNTHROID, LEVOTHROID) 75 MCG tablet TAKE 1 TABLET BY MOUTH DAILY    . Multiple Vitamin (MULTI-VITAMINS) TABS daily.     . Omega-3 Fatty Acids (FISH OIL) 1000 MG CAPS Take by mouth daily.    . risperiDONE (RISPERDAL) 1 MG tablet Take 1 mg by mouth at bedtime.    . triamcinolone (NASACORT ALLERGY 24HR) 55 MCG/ACT AERO nasal inhaler Place 2 sprays into the nose as needed. Once daily as needed.    . baclofen (LIORESAL) 10 MG tablet Take 1 tablet (10 mg total) by mouth 3 (three) times daily. 30 each 0   No current facility-administered medications on file prior to visit.    Allergies  Allergen Reactions  . Diclofenac Sodium Palpitations  . Naproxen Itching, Rash and Hives  . Olanzapine     Other reaction(s): Other Cross-eyed and hyperventilation  . Gluten Meal Other (See Comments)    Abdominal bloating  . Mite (D. Farinae) Itching    Respiratory illness, and itchy eyes, eye problems Respiratory illness, and itchy eyes, eye problems  . Nsaids Other (See Comments)    Other reaction(s): Other (See Comments) Aleve causes hives 10/15 pt states she  occ. Takes Motrin. Other reaction(s): Other (See Comments) Aleve causes hives  . Other Itching    Respiratory illness, and itchy eyes, eye problems Respiratory illness, and itchy eyes, eye problems      Review of Systems ROS Review of Systems - General ROS: negative for - chills, fatigue, fever, hot flashes, night sweats, weight gain or weight loss Psychological ROS: negative for - anxiety, decreased libido, depression, mood swings, physical abuse or sexual abuse Ophthalmic ROS:  negative for - blurry vision, eye pain or loss of vision ENT ROS: negative for - headaches, hearing change, visual changes or vocal changes Allergy and Immunology ROS: negative for - hives, itchy/watery eyes or seasonal allergies Hematological and Lymphatic ROS: negative for - bleeding problems, bruising, swollen lymph nodes or weight loss Endocrine ROS: negative for - galactorrhea, hair pattern changes, hot flashes, malaise/lethargy, mood swings, palpitations, polydipsia/polyuria, skin changes, temperature intolerance or unexpected weight changes Breast ROS: negative for - new or changing breast lumps or nipple discharge Respiratory ROS: negative for - cough or shortness of breath Cardiovascular ROS: negative for - chest pain, irregular heartbeat, palpitations or shortness of breath Gastrointestinal ROS: no abdominal pain, change in bowel habits, or black or bloody stools Genito-Urinary ROS: no dysuria, trouble voiding, or hematuria Musculoskeletal ROS: negative for - joint pain or joint stiffness Neurological ROS: negative for - bowel and bladder control changes Dermatological ROS: negative for rash and skin lesion changes   Objective:   BP (!) 141/88   Pulse 75   Ht 5\' 1"  (1.549 m)   Wt 126 lb 3.2 oz (57.2 kg)   LMP 04/10/2015 Comment: Only spotting  BMI 23.85 kg/m  CONSTITUTIONAL: Well-developed, well-nourished female in no acute distress.  PSYCHIATRIC: Normal mood and affect. Normal behavior. Normal  judgment and thought content. [place]: Alert and oriented to person, place, and time. Normal muscle tone coordination. No cranial nerve deficit noted. HENT:  Normocephalic, atraumatic, External right and left ear normal. Oropharynx is clear and moist EYES: Conjunctivae and EOM are normal. Pupils are equal, round, and reactive to light. No scleral icterus.  NECK: Normal range of motion, supple, no masses.  Normal thyroid.  SKIN: Skin is warm and dry. No rash noted. Not diaphoretic. No erythema. No pallor. CARDIOVASCULAR: Normal heart rate noted, regular rhythm, no murmur. RESPIRATORY: Clear to auscultation bilaterally. Effort and breath sounds normal, no problems with respiration noted. BREASTS: Symmetric in size. No masses, skin changes, nipple drainage, or lymphadenopathy. ABDOMEN: Soft, normal bowel sounds, no distention noted.  No tenderness, rebound or guarding.  BLADDER: Normal PELVIC:  Bladder no bladder distension noted  Urethra: normal appearing urethra with no masses, tenderness or lesions  Vulva: normal appearing vulva with no masses, tenderness or lesions  Vagina: mildly atrophic vagina with no discharge, no lesions  Cervix: normal appearing cervix without discharge or lesions  Uterus: uterus is normal size, shape, consistency and nontender  Adnexa: normal adnexa in size, nontender and no masses  RV: External Exam NormaI, No Rectal Masses and Normal Sphincter tone  MUSCULOSKELETAL: Normal range of motion. No tenderness.  No cyanosis, clubbing, or edema.  2+ distal pulses. LYMPHATIC: No Axillary, Supraclavicular, or Inguinal Adenopathy.   Labs: Lab Results  Component Value Date   WBC 8.1 10/31/2017   HGB 14.8 10/31/2017   HCT 42.8 10/31/2017   MCV 91.8 10/31/2017   PLT 236 10/31/2017    Lab Results  Component Value Date   CREATININE 0.89 10/31/2017   BUN 16 10/31/2017   NA 140 10/31/2017   K 3.8 10/31/2017   CL 102 10/31/2017   CO2 30 10/31/2017    Lab Results   Component Value Date   ALT 18 10/31/2017   AST 26 10/31/2017   ALKPHOS 84 10/31/2017   BILITOT 0.4 10/31/2017    Lab Results  Component Value Date   CHOL 214 (H) 10/31/2017   HDL 63 10/31/2017   LDLCALC 105 (H) 10/31/2017   TRIG 229 (H) 10/31/2017  CHOLHDL 3.4 10/31/2017    Lab Results  Component Value Date   TSH 0.063 (L) 10/31/2017    Lab Results  Component Value Date   HGBA1C 5.1 10/31/2017     Assessment:   Encounter for well woman exam with routine gynecological exam  Bipolar I disorder, most recent episode manic, severe with psychotic features Postoperative hypothyroidism Delusion of pregnancy Dyslipidemia Breast screening declined  Plan:  Pap: Pap Co Test performed today.  Mammogram: Patient declines mammogram due to fear of radiation. Discussed importance of breast cancer screening. Discussed alternative methods of screening, including possibly performing breast ultrasound (although less sensitive).  Patient notes that she may consider this.  Stool Guaiac Testing:  Not needed.  Colonoscopy up to date.  Labs: Paitent notes having her thyroid levels checked recently, reviewed in Care Everywhere. Will get other labs drawn by PCP. Routine preventative health maintenance measures emphasized: Exercise/Diet/Weight control, Tobacco Warnings, Alcohol/Substance use risks, Stress Management, Peer Pressure Issues and Safe Sex Encouraged use of Vitamin D and Calcium for bone support.  Patient continues with delusions of pregnancy.  Is seen by Psychiatry, notes taking most of her medications, but feels that some of which she does not need to take, or is more concerned regarding side effects.  Dyslipidemia mild, no need for medications at this time.  Return to [place], MD  [facility]

## 2019-04-09 NOTE — Patient Instructions (Signed)
Preventive Care 40-57 Years Old, Female Preventive care refers to visits with your health care provider and lifestyle choices that can promote health and wellness. This includes:  A yearly physical exam. This may also be called an annual well check.  Regular dental visits and eye exams.  Immunizations.  Screening for certain conditions.  Healthy lifestyle choices, such as eating a healthy diet, getting regular exercise, not using drugs or products that contain nicotine and tobacco, and limiting alcohol use. What can I expect for my preventive care visit? Physical exam Your health care provider will check your:  Height and weight. This may be used to calculate body mass index (BMI), which tells if you are at a healthy weight.  Heart rate and blood pressure.  Skin for abnormal spots. Counseling Your health care provider may ask you questions about your:  Alcohol, tobacco, and drug use.  Emotional well-being.  Home and relationship well-being.  Sexual activity.  Eating habits.  Work and work environment.  Method of birth control.  Menstrual cycle.  Pregnancy history. What immunizations do I need?  Influenza (flu) vaccine  This is recommended every year. Tetanus, diphtheria, and pertussis (Tdap) vaccine  You may need a Td booster every 10 years. Varicella (chickenpox) vaccine  You may need this if you have not been vaccinated. Zoster (shingles) vaccine  You may need this after age 60. Measles, mumps, and rubella (MMR) vaccine  You may need at least one dose of MMR if you were born in 1957 or later. You may also need a second dose. Pneumococcal conjugate (PCV13) vaccine  You may need this if you have certain conditions and were not previously vaccinated. Pneumococcal polysaccharide (PPSV23) vaccine  You may need one or two doses if you smoke cigarettes or if you have certain conditions. Meningococcal conjugate (MenACWY) vaccine  You may need this if you  have certain conditions. Hepatitis A vaccine  You may need this if you have certain conditions or if you travel or work in places where you may be exposed to hepatitis A. Hepatitis B vaccine  You may need this if you have certain conditions or if you travel or work in places where you may be exposed to hepatitis B. Haemophilus influenzae type b (Hib) vaccine  You may need this if you have certain conditions. Human papillomavirus (HPV) vaccine  If recommended by your health care provider, you may need three doses over 6 months. You may receive vaccines as individual doses or as more than one vaccine together in one shot (combination vaccines). Talk with your health care provider about the risks and benefits of combination vaccines. What tests do I need? Blood tests  Lipid and cholesterol levels. These may be checked every 5 years, or more frequently if you are over 50 years old.  Hepatitis C test.  Hepatitis B test. Screening  Lung cancer screening. You may have this screening every year starting at age 55 if you have a 30-pack-year history of smoking and currently smoke or have quit within the past 15 years.  Colorectal cancer screening. All adults should have this screening starting at age 50 and continuing until age 75. Your health care provider may recommend screening at age 45 if you are at increased risk. You will have tests every 1-10 years, depending on your results and the type of screening test.  Diabetes screening. This is done by checking your blood sugar (glucose) after you have not eaten for a while (fasting). You may have this   done every 1-3 years.  Mammogram. This may be done every 1-2 years. Talk with your health care provider about when you should start having regular mammograms. This may depend on whether you have a family history of breast cancer.  BRCA-related cancer screening. This may be done if you have a family history of breast, ovarian, tubal, or peritoneal  cancers.  Pelvic exam and Pap test. This may be done every 3 years starting at age 60. Starting at age 7, this may be done every 5 years if you have a Pap test in combination with an HPV test. Other tests  Sexually transmitted disease (STD) testing.  Bone density scan. This is done to screen for osteoporosis. You may have this scan if you are at high risk for osteoporosis. Follow these instructions at home: Eating and drinking  Eat a diet that includes fresh fruits and vegetables, whole grains, lean protein, and low-fat dairy.  Take vitamin and mineral supplements as recommended by your health care provider.  Do not drink alcohol if: ? Your health care provider tells you not to drink. ? You are pregnant, may be pregnant, or are planning to become pregnant.  If you drink alcohol: ? Limit how much you have to 0-1 drink a day. ? Be aware of how much alcohol is in your drink. In the [place] one drink equals one 12 oz bottle of beer (355 mL), one 5 oz glass of wine (148 mL), or one 1 oz glass of hard liquor (44 mL). Lifestyle  Take daily care of your teeth and gums.  Stay active. Exercise for at least 30 minutes on 5 or more days each week.  Do not use any products that contain nicotine or tobacco, such as cigarettes, e-cigarettes, and chewing tobacco. If you need help quitting, ask your health care provider.  If you are sexually active, practice safe sex. Use a condom or other form of birth control (contraception) in order to prevent pregnancy and STIs (sexually transmitted infections).  If told by your health care provider, take low-dose aspirin daily starting at age 48. What's next?  Visit your health care provider once a year for a well check visit.  Ask your health care provider how often you should have your eyes and teeth checked.  Stay up to date on all vaccines. This information is not intended to replace advice given to you by your health care provider. Make sure you  discuss any questions you have with your health care provider. Document Revised: 11/23/2017 Document Reviewed: 11/23/2017 Elsevier Patient Education  2020 [place] Breast self-awareness is knowing how your breasts look and feel. Doing breast self-awareness is important. It allows you to catch a breast problem early while it is still small and can be treated. All women should do breast self-awareness, including women who have had breast implants. Tell your doctor if you notice a change in your breasts. What you need:  A mirror.  A well-lit room. How to do a breast self-exam A breast self-exam is one way to learn what is normal for your breasts and to check for changes. To do a breast self-exam: Look for changes  1. Take off all the clothes above your waist. 2. Stand in front of a mirror in a room with good lighting. 3. Put your hands on your hips. 4. Push your hands down. 5. Look at your breasts and nipples in the mirror to see if one breast or nipple looks different from the  other. Check to see if: ? The shape of one breast is different. ? The size of one breast is different. ? There are wrinkles, dips, and bumps in one breast and not the other. 6. Look at each breast for changes in the skin, such as: ? Redness. ? Scaly areas. 7. Look for changes in your nipples, such as: ? Liquid around the nipples. ? Bleeding. ? Dimpling. ? Redness. ? A change in where the nipples are. Feel for changes  1. Lie on your back on the floor. 2. Feel each breast. To do this, follow these steps: ? Pick a breast to feel. ? Put the arm closest to that breast above your head. ? Use your other arm to feel the nipple area of your breast. Feel the area with the pads of your three middle fingers by making small circles with your fingers. For the first circle, press lightly. For the second circle, press harder. For the third circle, press even harder. ? Keep making circles with  your fingers at the different pressures as you move down your breast. Stop when you feel your ribs. ? Move your fingers a little toward the center of your body. ? Start making circles with your fingers again, this time going up until you reach your collarbone. ? Keep making up-and-down circles until you reach your armpit. Remember to keep using the three pressures. ? Feel the other breast in the same way. 3. Sit or stand in the tub or shower. 4. With soapy water on your skin, feel each breast the same way you did in step 2 when you were lying on the floor. Write down what you find Writing down what you find can help you remember what to tell your doctor. Write down:  What is normal for each breast.  Any changes you find in each breast, including: ? The kind of changes you find. ? Whether you have pain. ? Size and location of any lumps.  When you last had your menstrual period. General tips  Check your breasts every month.  If you are breastfeeding, the best time to check your breasts is after you feed your baby or after you use a breast pump.  If you get menstrual periods, the best time to check your breasts is 5-7 days after your menstrual period is over.  With time, you will become comfortable with the self-exam, and you will begin to know if there are changes in your breasts. Contact a doctor if you:  See a change in the shape or size of your breasts or nipples.  See a change in the skin of your breast or nipples, such as red or scaly skin.  Have fluid coming from your nipples that is not normal.  Find a lump or thick area that was not there before.  Have pain in your breasts.  Have any concerns about your breast health. Summary  Breast self-awareness includes looking for changes in your breasts, as well as feeling for changes within your breasts.  Breast self-awareness should be done in front of a mirror in a well-lit room.  You should check your breasts every month.  If you get menstrual periods, the best time to check your breasts is 5-7 days after your menstrual period is over.  Let your doctor know of any changes you see in your breasts, including changes in size, changes on the skin, pain or tenderness, or fluid from your nipples that is not normal. This information is not  intended to replace advice given to you by your health care provider. Make sure you discuss any questions you have with your health care provider. Document Revised: 10/31/2017 Document Reviewed: 10/31/2017 Elsevier Patient Education  [place].

## 2019-04-10 ENCOUNTER — Encounter: Payer: Medicare Other | Admitting: Obstetrics and Gynecology

## 2019-04-11 ENCOUNTER — Encounter: Payer: Self-pay | Admitting: Obstetrics and Gynecology

## 2019-04-12 DIAGNOSIS — F209 Schizophrenia, unspecified: Secondary | ICD-10-CM | POA: Diagnosis not present

## 2019-04-12 DIAGNOSIS — F324 Major depressive disorder, single episode, in partial remission: Secondary | ICD-10-CM | POA: Diagnosis not present

## 2019-04-14 ENCOUNTER — Encounter: Payer: Self-pay | Admitting: Physician Assistant

## 2019-04-15 LAB — CYTOLOGY - PAP
Comment: NEGATIVE
Comment: NEGATIVE
Diagnosis: NEGATIVE
HPV 16: NEGATIVE
HPV 18 / 45: NEGATIVE
High risk HPV: POSITIVE — AB

## 2019-04-26 DIAGNOSIS — F324 Major depressive disorder, single episode, in partial remission: Secondary | ICD-10-CM | POA: Diagnosis not present

## 2019-04-26 DIAGNOSIS — F209 Schizophrenia, unspecified: Secondary | ICD-10-CM | POA: Diagnosis not present

## 2019-04-29 ENCOUNTER — Encounter: Payer: Self-pay | Admitting: Physician Assistant

## 2019-05-09 DIAGNOSIS — F324 Major depressive disorder, single episode, in partial remission: Secondary | ICD-10-CM | POA: Diagnosis not present

## 2019-05-09 DIAGNOSIS — F209 Schizophrenia, unspecified: Secondary | ICD-10-CM | POA: Diagnosis not present

## 2019-05-24 ENCOUNTER — Encounter: Payer: Self-pay | Admitting: Physician Assistant

## 2019-05-24 ENCOUNTER — Other Ambulatory Visit: Payer: Self-pay

## 2019-05-24 ENCOUNTER — Ambulatory Visit (INDEPENDENT_AMBULATORY_CARE_PROVIDER_SITE_OTHER): Payer: Medicare Other | Admitting: Physician Assistant

## 2019-05-24 VITALS — BP 131/84 | HR 66 | Temp 96.8°F | Resp 16 | Ht 61.0 in | Wt 127.8 lb

## 2019-05-24 DIAGNOSIS — F22 Delusional disorders: Secondary | ICD-10-CM | POA: Diagnosis not present

## 2019-05-24 DIAGNOSIS — R102 Pelvic and perineal pain unspecified side: Secondary | ICD-10-CM

## 2019-05-24 DIAGNOSIS — M542 Cervicalgia: Secondary | ICD-10-CM | POA: Diagnosis not present

## 2019-05-24 DIAGNOSIS — R413 Other amnesia: Secondary | ICD-10-CM | POA: Diagnosis not present

## 2019-05-24 LAB — POCT URINALYSIS DIPSTICK
Appearance: NORMAL
Bilirubin, UA: NEGATIVE
Blood, UA: NEGATIVE
Glucose, UA: NEGATIVE
Ketones, UA: NEGATIVE
Leukocytes, UA: NEGATIVE
Nitrite, UA: NEGATIVE
Protein, UA: NEGATIVE
Spec Grav, UA: <=1.005 — AB (ref 1.010–1.025)
Urobilinogen, UA: 0.2 E.U./dL
pH, UA: 6.5 (ref 5.0–8.0)

## 2019-05-24 NOTE — Progress Notes (Signed)
Patient: Diamond Lucas Female    DOB: Dec 28, 1960   59 y.o.   MRN: [id number] Visit Date: 05/29/2019  Today's Provider: Mar Daring, PA-C   Chief Complaint  Patient presents with  . Memory Loss  . Neck Pain   Subjective:    I, Sulibeya S. Dimas, CMA, am acting as a Education administrator for [employer], PA-C.  HPI Patient here today C/O a having episodes of memory loss. Patient reports that symptoms started in 2017 and are getting worse. Patient reports being disoriented in places like her own apartment. Patient reports she is taking vitamin B daily. 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   Neck Pain:  Patient C/O recurrent neck pain on and off since 2014. Patient reports that she felt her neck "crack and vibrated." Reports the muscle relaxer helps.   Allergies  Allergen Reactions  . Diclofenac Sodium Palpitations  . Naproxen Itching, Rash and Hives  . Olanzapine     Other reaction(s): Other Cross-eyed and hyperventilation  . Gluten Meal Other (See Comments)    Abdominal bloating  . Mite (D. Farinae) Itching    Respiratory illness, and itchy eyes, eye problems Respiratory illness, and itchy eyes, eye problems  . Nsaids Other (See Comments)    Other reaction(s): Other (See Comments) Aleve causes hives 10/15 pt states she occ. Takes Motrin. Other reaction(s): Other (See Comments) Aleve causes hives  . Other Itching    Respiratory illness, and itchy eyes, eye problems Respiratory illness, and itchy eyes, eye problems     Current Outpatient Medications:  .  Calcium Carbonate-Vit D-Min (CALCIUM 600+D PLUS MINERALS) 600-400 MG-UNIT TABS, Take by mouth daily. , Disp: , Rfl:  .  Carboxymeth-Glyc-Polysorb PF (REFRESH OPTIVE MEGA-3) 0.5-1-0.5 % SOLN, Apply  2 drops to eye 2 (two) times daily., Disp: , Rfl:  .  levothyroxine (SYNTHROID, LEVOTHROID) 75 MCG tablet, TAKE 1 TABLET BY MOUTH DAILY, Disp: , Rfl:  .  Multiple Vitamin (MULTI-VITAMINS) TABS, daily. , Disp: , Rfl:  .  Omega-3 Fatty Acids (FISH OIL) 1000 MG CAPS, Take by mouth daily., Disp: , Rfl:  .  risperiDONE (RISPERDAL) 1 MG tablet, Take 1 mg by mouth at bedtime., Disp: , Rfl:  .  triamcinolone (NASACORT ALLERGY 24HR) 55 MCG/ACT AERO nasal inhaler, Place 2 sprays into the nose as needed. Once daily as needed., Disp: , Rfl:   Review of Systems  Constitutional: Negative.   HENT: Negative.   Respiratory: Negative.   Cardiovascular: Negative.   Genitourinary: Negative.   Musculoskeletal: Positive for neck pain.  Neurological: Negative.   Psychiatric/Behavioral: Positive for hallucinations.    Social History   Tobacco Use  . Smoking status: Never Smoker  . Smokeless tobacco: Never Used  Substance Use Topics  . Alcohol use: No      Objective:   BP 131/84 (BP Location: Left Arm, Patient Position: Sitting, Cuff Size: Normal)   Pulse 66   Temp (!) 96.8 F (36 C) (Temporal)   Resp 16   Ht 5\' 1"  (1.549 m)   Wt 127 lb 12.8 oz (58 kg)   LMP 04/10/2015 Comment: Only spotting  BMI 24.15 kg/m  Vitals:   05/24/19 0829  BP: 131/84  Pulse: 66  Resp: 16  Temp: (!) 96.8 F (  36 C)  TempSrc: Temporal  Weight: 127 lb 12.8 oz (58 kg)  Height: 5\' 1"  (1.549 m)  Body mass index is 24.15 kg/m.   Physical Exam Vitals reviewed.  Constitutional:      General: She is not in acute distress.    Appearance: Normal appearance. She is well-developed and normal weight. She is not ill-appearing or diaphoretic.  Neck:     Thyroid: No thyromegaly.     Vascular: No JVD.     Trachea: No tracheal deviation.  Cardiovascular:     Rate and Rhythm: Normal rate and regular rhythm.     Pulses: Normal pulses.     Heart sounds: Normal heart sounds. No murmur. No friction rub. No gallop.     Pulmonary:     Effort: Pulmonary effort is normal. No respiratory distress.     Breath sounds: Normal breath sounds. No wheezing or rales.  Musculoskeletal:     Cervical back: Normal range of motion and neck supple. No rigidity or tenderness. No spinous process tenderness or muscular tenderness. Normal range of motion.  Lymphadenopathy:     Cervical: No cervical adenopathy.  Neurological:     Mental Status: She is alert.  Psychiatric:        Attention and Perception: Attention normal.        Mood and Affect: Mood and affect normal.        Speech: Speech normal.        Behavior: Behavior normal. Behavior is cooperative.        Thought Content: Thought content is delusional.        Cognition and Memory: Memory normal. She does not exhibit impaired recent memory or impaired remote memory.        Judgment: Judgment normal.      Results for orders placed or performed in visit on 05/24/19  POCT Urinalysis Dipstick  Result Value Ref Range   Color, UA pale yellow    Clarity, UA clear    Glucose, UA Negative Negative   Bilirubin, UA negative    Ketones, UA negative    Spec Grav, UA <=1.005 (A) 1.010 - 1.025   Blood, UA negative    pH, UA 6.5 5.0 - 8.0   Protein, UA Negative Negative   Urobilinogen, UA 0.2 0.2 or 1.0 E.U./dL   Nitrite, UA negative    Leukocytes, UA Negative Negative   Appearance normal    Odor none        Assessment & Plan    1. Pelvic pressure in female UA normal. Obtained urine specimen to make sure not a UTI causing the symptoms patient equates to being "bats in her uterus".   2. 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 Reports compliance with medications and with appointments with her therapist.   3. 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  4. Memory change MMSE today was normal. Patient scored 30/30.  5. Neck pain Discussed obtaining new xrays and referral to PT but patient declines at this time.  Will let me know if worsening.      Mar Daring, PA-C  [facility]

## 2019-05-24 NOTE — Patient Instructions (Signed)
Voltaren gel 

## 2019-06-06 ENCOUNTER — Encounter: Payer: Self-pay | Admitting: Physician Assistant

## 2019-06-06 DIAGNOSIS — L678 Other hair color and hair shaft abnormalities: Secondary | ICD-10-CM

## 2019-06-06 MED ORDER — EFLORNITHINE HCL 13.9 % EX CREA: TOPICAL_CREAM | CUTANEOUS | 1 refills | Status: DC

## 2019-06-08 ENCOUNTER — Encounter: Payer: Self-pay | Admitting: Physician Assistant

## 2019-06-11 DIAGNOSIS — F324 Major depressive disorder, single episode, in partial remission: Secondary | ICD-10-CM | POA: Diagnosis not present

## 2019-06-11 DIAGNOSIS — F209 Schizophrenia, unspecified: Secondary | ICD-10-CM | POA: Diagnosis not present

## 2019-06-12 ENCOUNTER — Encounter: Payer: Self-pay | Admitting: Physician Assistant

## 2019-06-30 ENCOUNTER — Encounter: Payer: Self-pay | Admitting: Physician Assistant

## 2019-07-01 ENCOUNTER — Ambulatory Visit (INDEPENDENT_AMBULATORY_CARE_PROVIDER_SITE_OTHER): Payer: Medicare Other | Admitting: Physician Assistant

## 2019-07-01 ENCOUNTER — Encounter: Payer: Self-pay | Admitting: Physician Assistant

## 2019-07-01 ENCOUNTER — Other Ambulatory Visit: Payer: Self-pay

## 2019-07-01 VITALS — BP 114/76 | HR 67 | Temp 97.3°F | Wt 130.0 lb

## 2019-07-01 DIAGNOSIS — F324 Major depressive disorder, single episode, in partial remission: Secondary | ICD-10-CM | POA: Diagnosis not present

## 2019-07-01 DIAGNOSIS — E890 Postprocedural hypothyroidism: Secondary | ICD-10-CM

## 2019-07-01 DIAGNOSIS — F209 Schizophrenia, unspecified: Secondary | ICD-10-CM | POA: Diagnosis not present

## 2019-07-01 DIAGNOSIS — R002 Palpitations: Secondary | ICD-10-CM | POA: Diagnosis not present

## 2019-07-01 DIAGNOSIS — F22 Delusional disorders: Secondary | ICD-10-CM

## 2019-07-01 NOTE — Patient Instructions (Signed)

## 2019-07-01 NOTE — Progress Notes (Signed)
Established patient visit   Patient: Diamond Lucas   DOB: [birth year]   59 y.o. Female  MRN: [id number] Visit Date: 07/01/2019  Today's healthcare provider: Mar Daring, PA-C  Subjective:    Chief Complaint  Patient presents with  . Palpitations   HPI  Patient presents today for heart palpitations. Reports she has been having a weird sensation in her heart intermittently since 06/13/19. She reports it started at her dentist appointment and it felt "like the blood was pumping backwards". She denies any chest pain associated with this. She did have some SOB and dizziness with the initial episode. None since. She reports these episodes of feeling her heart flutter and feel like it is pumping weird happens maybe every 2-3 days. Reports she has a cousin having similar symptoms.   Medications: Outpatient Medications Prior to Visit  Medication Sig  . Calcium Carbonate-Vit D-Min (CALCIUM 600+D PLUS MINERALS) 600-400 MG-UNIT TABS Take by mouth daily.   . Carboxymeth-Glyc-Polysorb PF (REFRESH OPTIVE MEGA-3) 0.5-1-0.5 % SOLN Apply 2 drops to eye 2 (two) times daily.  . Eflornithine HCl 13.9 % cream Use twice daily 8 hours apart  . levothyroxine (SYNTHROID, LEVOTHROID) 75 MCG tablet TAKE 1 TABLET BY MOUTH DAILY  . Multiple Vitamin (MULTI-VITAMINS) TABS daily.   . Omega-3 Fatty Acids (FISH OIL) 1000 MG CAPS Take by mouth daily.  . risperiDONE (RISPERDAL) 1 MG tablet Take 1 mg by mouth at bedtime.  . triamcinolone (NASACORT ALLERGY 24HR) 55 MCG/ACT AERO nasal inhaler Place 2 sprays into the nose as needed. Once daily as needed.   No facility-administered medications prior to visit.    Review of Systems  Constitutional: Negative.   Respiratory: Negative.  Negative for chest tightness and shortness of breath.   Cardiovascular: Positive for palpitations. Negative for chest pain and leg swelling.  Gastrointestinal: Negative for abdominal pain and nausea.  Neurological: Negative  for dizziness and headaches.    Last CBC Lab Results  Component Value Date   WBC 8.1 10/31/2017   HGB 14.8 10/31/2017   HCT 42.8 10/31/2017   MCV 91.8 10/31/2017   MCH 31.8 10/31/2017   RDW 13.0 10/31/2017   PLT 236 [id number]   Last metabolic panel Lab Results  Component Value Date   GLUCOSE 104 (H) 10/31/2017   NA 140 10/31/2017   K 3.8 10/31/2017   CL 102 10/31/2017   CO2 30 10/31/2017   BUN 16 10/31/2017   CREATININE 0.89 10/31/2017   GFRNONAA >60 10/31/2017   GFRAA >60 10/31/2017   CALCIUM 9.0 10/31/2017   PROT 7.3 10/31/2017   ALBUMIN 4.4 10/31/2017   LABGLOB 2.7 07/12/2017   AGRATIO 1.6 07/12/2017   BILITOT 0.4 10/31/2017   ALKPHOS 84 10/31/2017   AST 26 10/31/2017   ALT 18 10/31/2017   ANIONGAP 8 10/31/2017   Last thyroid functions Lab Results  Component Value Date   TSH 0.063 (L) 10/31/2017   T3TOTAL 112 11/02/2017   T4TOTAL 12.0 (H) 02/10/2017        Objective:    LMP 04/10/2015 Comment: Only spotting BP Readings from Last 3 Encounters:  07/01/19 114/76  05/24/19 131/84  04/09/19 (!) 141/88      Physical Exam Vitals reviewed.  Constitutional:      General: She is not in acute distress.    Appearance: Normal appearance. She is well-developed and normal weight. She is not ill-appearing or diaphoretic.  HENT:     Head: Normocephalic and atraumatic.  Eyes:  General: No scleral icterus. Cardiovascular:     Rate and Rhythm: Normal rate and regular rhythm.     Pulses: Normal pulses.     Heart sounds: Normal heart sounds. No murmur. No friction rub. No gallop.   Pulmonary:     Effort: Pulmonary effort is normal. No respiratory distress.     Breath sounds: Normal breath sounds. No wheezing or rales.  Musculoskeletal:     Cervical back: Normal range of motion and neck supple.  Neurological:     Mental Status: She is alert.     No results found for any visits on 07/01/19.    Assessment & Plan:    1. Heart palpitations EKG today  showed NSR rate of 66 without ST segment changes. Will refer to Cardiology as below for consideration of Holter monitor to make sure no arrhythmias.  - EKG 12-Lead - Ambulatory referral to Cardiology  2. Postoperative hypothyroidism Has been hyperthyroid from over-correction but most recent TSH from 02/2019 was normal (in [place]).  3. 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 Most often delusions of infestation, but none mentioned today.         Rubye Beach  [facility] [phone] (phone) [phone] (fax)  [place]

## 2019-07-08 ENCOUNTER — Ambulatory Visit (INDEPENDENT_AMBULATORY_CARE_PROVIDER_SITE_OTHER): Payer: Medicare Other | Admitting: Cardiology

## 2019-07-08 ENCOUNTER — Ambulatory Visit (INDEPENDENT_AMBULATORY_CARE_PROVIDER_SITE_OTHER): Payer: Medicare Other

## 2019-07-08 ENCOUNTER — Encounter: Payer: Self-pay | Admitting: Cardiology

## 2019-07-08 ENCOUNTER — Other Ambulatory Visit: Payer: Self-pay

## 2019-07-08 VITALS — BP 128/70 | HR 67 | Ht 61.0 in | Wt 130.1 lb

## 2019-07-08 DIAGNOSIS — E7800 Pure hypercholesterolemia, unspecified: Secondary | ICD-10-CM | POA: Diagnosis not present

## 2019-07-08 DIAGNOSIS — R002 Palpitations: Secondary | ICD-10-CM

## 2019-07-08 NOTE — Patient Instructions (Signed)
Medication Instructions:  Your physician recommends that you continue on your current medications as directed. Please refer to the Current Medication list given to you today.  *If you need a refill on your cardiac medications before your next appointment, please call your pharmacy*   Lab Work: None ordered If you have labs (blood work) drawn today and your tests are completely normal, you will receive your results only by: Marland Kitchen MyChart Message (if you have MyChart) OR . A paper copy in the mail If you have any lab test that is abnormal or we need to change your treatment, we will call you to review the results.   Testing/Procedures: Your physician has recommended that you wear a Zio monitor. This monitor is a medical device that records the heart's electrical activity. Doctors most often use these monitors to diagnose arrhythmias. Arrhythmias are problems with the speed or rhythm of the heartbeat. The monitor is a small device applied to your chest. You can wear one while you do your normal daily activities. While wearing this monitor if you have any symptoms to push the button and record what you felt. Once you have worn this monitor for the period of time provider prescribed (Usually 14 days), you will return the monitor device in the postage paid box. Once it is returned they will download the data collected and provide [place] with a report which the provider will then review and we will call you with those results. Important tips:  1. Avoid showering during the first 24 hours of wearing the monitor. 2. Avoid excessive sweating to help maximize wear time. 3. Do not submerge the device, no hot tubs, and no swimming pools. 4. Keep any lotions or oils away from the patch. 5. After 24 hours you may shower with the patch on. Take brief showers with your back facing the shower head.  6. Do not remove patch once it has been placed because that will interrupt data and decrease adhesive wear time. 7. Push  the button when you have any symptoms and write down what you were feeling. 8. Once you have completed wearing your monitor, remove and place into box which has postage paid and place in your outgoing mailbox.  9. If for some reason you have misplaced your box then call our office and we can provide another box and/or mail it off for you.         Follow-Up: At [facility], you and your health needs are our priority.  As part of our continuing mission to provide you with exceptional heart care, we have created designated Provider Care Teams.  These Care Teams include your primary Cardiologist (physician) and Advanced Practice Providers (APPs -  Physician Assistants and Nurse Practitioners) who all work together to provide you with the care you need, when you need it.  We recommend signing up for the patient portal called "MyChart".  Sign up information is provided on this After Visit Summary.  MyChart is used to connect with patients for Virtual Visits (Telemedicine).  Patients are able to view lab/test results, encounter notes, upcoming appointments, etc.  Non-urgent messages can be sent to your provider as well.   To learn more about what you can do with MyChart, go to [email or web address].    Your next appointment:   4-5 week(s)  The format for your next appointment:   In Person  Provider:    You may see Kate Sable, MD or one of the following Advanced Practice Providers on your  designated Care Team:    Murray Hodgkins, NP  Christell Faith, PA-C  Marrianne Mood, PA-C    Other Instructions N/a

## 2019-07-08 NOTE — Progress Notes (Signed)
Cardiology Office Note:    Date:  07/08/2019   ID:  Diamond Lucas, DOB February 02, 1961, MRN [id number]  PCP:  Mar Daring, PA-C  Cardiologist:  Kate Sable, MD  Electrophysiologist:  None   Referring MD: Florian Buff*   Chief Complaint  Patient presents with  . New Patient (Initial Visit)    referred by PCP for palpitations. Meds reviewed verbally with patient.     History of Present Illness:    Diamond Lucas is a 59 y.o. female with a hx of hypothyroidism, schizophrenia who presents due to palpitations.  She states having palpitations/increased heart rates for the past month.  Symptoms started after she had novocaine for dental procedure on 06/13/2019.  She describes symptoms of fast heart rates/feeling of skipped heartbeats occurring about 4 times weekly, lasting 30 seconds.  Symptoms are not related with exertion.  Nothing seems to make symptoms worse, or better.  Patient denies chest pain, shortness of breath, dizziness.  Denies smoking.  She wanted to check her heart to make sure everything is okay.  Past Medical History:  Diagnosis Date  . Allergy   . Arrhythmia   . History of chicken pox   . History of measles   . History of syncope   . Osteoarthritis   . Rosacea   . Schizophrenia ([place])   . Thyroid disease     Past Surgical History:  Procedure Laterality Date  . COLONOSCOPY WITH PROPOFOL N/A 02/08/2018   Procedure: COLONOSCOPY WITH PROPOFOL;  Surgeon: Virgel Manifold, MD;  Location: [facility] ENDOSCOPY;  Service: Endoscopy;  Laterality: N/A;  . FOOT SURGERY Right   . THYROIDECTOMY      Current Medications: Current Meds  Medication Sig  . Calcium Carbonate-Vit D-Min (CALCIUM 600+D PLUS MINERALS) 600-400 MG-UNIT TABS Take by mouth daily.   . Carboxymeth-Glyc-Polysorb PF (REFRESH OPTIVE MEGA-3) 0.5-1-0.5 % SOLN Apply 2 drops to eye 2 (two) times daily.  . Eflornithine HCl 13.9 % cream Use twice daily 8 hours apart  .  levothyroxine (SYNTHROID, LEVOTHROID) 75 MCG tablet TAKE 1 TABLET BY MOUTH DAILY  . Multiple Vitamin (MULTI-VITAMINS) TABS daily.   . Omega-3 Fatty Acids (FISH OIL) 1000 MG CAPS Take by mouth daily.  . risperiDONE (RISPERDAL) 1 MG tablet Take 1 mg by mouth at bedtime.  . triamcinolone (NASACORT ALLERGY 24HR) 55 MCG/ACT AERO nasal inhaler Place 2 sprays into the nose as needed. Once daily as needed.     Allergies:   Diclofenac sodium, Naproxen, Olanzapine, Gluten meal, Mite (d. farinae), Nsaids, and Other   Social History   Socioeconomic History  . Marital status: Single    Spouse name: Not on file  . Number of children: 0  . Years of education: Not on file  . Highest education level: Bachelor's degree (e.g., BA, AB, BS)  Occupational History  . 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 Smoking status: Never Smoker  . Smokeless tobacco: Never Used  Substance and Sexual Activity  . Alcohol use: No  . Drug use: No  . Sexual activity: Not Currently    Birth control/protection: Post-menopausal, Abstinence  Other Topics Concern  . Not on file  Social History Narrative  . Not on file   Social Determinants of Health   Financial Resource Strain:   . Difficulty of Paying Living Expenses:   Food Insecurity:   . Worried About Charity fundraiser in the  Last Year:   . [place] in the Last Year:   Transportation Needs:   . Film/video editor (Medical):   Marland Kitchen Lack of Transportation (Non-Medical):   Physical Activity:   . Days of Exercise per Week:   . Minutes of Exercise per Session:   Stress:   . Feeling of Stress :   Social Connections:   . Frequency of Communication with Friends and Family:   . Frequency of Social Gatherings with Friends and Family:   . Attends Religious Services:   . Active Member of Clubs or Organizations:   . Attends Archivist Meetings:   Marland Kitchen Marital Status:      Family  History: The patient's family history includes Anxiety disorder in her brother; Cancer in an other family member; Cataracts in her mother; Depression in her mother; Diabetes in an other family member; Heart disease in an other family member; Heart failure in an other family member; Kidney cancer in her father; Stroke in her mother and another family member.  ROS:   Please see the history of present illness.     All other systems reviewed and are negative.  EKGs/Labs/Other Studies Reviewed:    The following studies were reviewed today:   EKG:  EKG is  ordered today.  The ekg ordered today demonstrates normal sinus rhythm, normal ECG.  Recent Labs: No results found for requested labs within last 8760 hours.  Recent Lipid Panel    Component Value Date/Time   CHOL 214 (H) 10/31/2017 1352   CHOL 205 (H) 07/12/2017 1607   TRIG 229 (H) 10/31/2017 1352   HDL 63 10/31/2017 1352   HDL 85 07/12/2017 1607   CHOLHDL 3.4 10/31/2017 1352   VLDL 46 (H) 10/31/2017 1352   LDLCALC 105 (H) 10/31/2017 1352   LDLCALC 107 (H) 07/12/2017 1607    Physical Exam:    VS:  BP 128/70 (BP Location: Right Arm, Patient Position: Sitting, Cuff Size: Normal)   Pulse 67   Ht 5\' 1"  (1.549 m)   Wt 130 lb 2 oz (59 kg)   LMP 04/10/2015 Comment: Only spotting  SpO2 98%   BMI 24.59 kg/m     Wt Readings from Last 3 Encounters:  07/08/19 130 lb 2 oz (59 kg)  07/01/19 130 lb (59 kg)  05/24/19 127 lb 12.8 oz (58 kg)     GEN:  Well nourished, well developed in no acute distress HEENT: Normal NECK: No JVD; No carotid bruits LYMPHATICS: No lymphadenopathy CARDIAC: RRR, no murmurs, rubs, gallops RESPIRATORY:  Clear to auscultation without rales, wheezing or rhonchi  ABDOMEN: Soft, non-tender, non-distended MUSCULOSKELETAL:  No edema; No deformity  SKIN: Warm and dry NEUROLOGIC:  Alert and oriented x 3 PSYCHIATRIC:  Normal affect   ASSESSMENT:    1. Palpitations   2. Pure hypercholesterolemia    PLAN:     In order of problems listed above:  1. Patient with a month history of palpitations.  Symptoms not related with exertion.  Symptoms occur 4 times weekly.  Will please see 2-week cardiac monitor to evaluate for any significant arrhythmias. 2. Elevated total cholesterol and LDL levels.  10-year ASCVD risk score of 2.1%.  Patient not in aspirin or statin benefit category.  Recommend low cholesterol diet.  Follow-up after cardiac monitor in a month  This note was generated in part or whole with voice recognition software. Voice recognition is usually quite accurate but there are transcription errors that can and very often  do occur. I apologize for any typographical errors that were not detected and corrected.  Medication Adjustments/Labs and Tests Ordered: Current medicines are reviewed at length with the patient today.  Concerns regarding medicines are outlined above.  Orders Placed This Encounter  Procedures  . LONG TERM MONITOR (3-14 DAYS)  . EKG 12-Lead   No orders of the defined types were placed in this encounter.   Patient Instructions  Medication Instructions:  Your physician recommends that you continue on your current medications as directed. Please refer to the Current Medication list given to you today.  *If you need a refill on your cardiac medications before your next appointment, please call your pharmacy*   Lab Work: None ordered If you have labs (blood work) drawn today and your tests are completely normal, you will receive your results only by: Marland Kitchen MyChart Message (if you have MyChart) OR . A paper copy in the mail If you have any lab test that is abnormal or we need to change your treatment, we will call you to review the results.   Testing/Procedures: Your physician has recommended that you wear a Zio monitor. This monitor is a medical device that records the heart's electrical activity. Doctors most often use these monitors to diagnose arrhythmias. Arrhythmias  are problems with the speed or rhythm of the heartbeat. The monitor is a small device applied to your chest. You can wear one while you do your normal daily activities. While wearing this monitor if you have any symptoms to push the button and record what you felt. Once you have worn this monitor for the period of time provider prescribed (Usually 14 days), you will return the monitor device in the postage paid box. Once it is returned they will download the data collected and provide [place] with a report which the provider will then review and we will call you with those results. Important tips:  1. Avoid showering during the first 24 hours of wearing the monitor. 2. Avoid excessive sweating to help maximize wear time. 3. Do not submerge the device, no hot tubs, and no swimming pools. 4. Keep any lotions or oils away from the patch. 5. After 24 hours you may shower with the patch on. Take brief showers with your back facing the shower head.  6. Do not remove patch once it has been placed because that will interrupt data and decrease adhesive wear time. 7. Push the button when you have any symptoms and write down what you were feeling. 8. Once you have completed wearing your monitor, remove and place into box which has postage paid and place in your outgoing mailbox.  9. If for some reason you have misplaced your box then call our office and we can provide another box and/or mail it off for you.         Follow-Up: At [facility], you and your health needs are our priority.  As part of our continuing mission to provide you with exceptional heart care, we have created designated Provider Care Teams.  These Care Teams include your primary Cardiologist (physician) and Advanced Practice Providers (APPs -  Physician Assistants and Nurse Practitioners) who all work together to provide you with the care you need, when you need it.  We recommend signing up for the patient portal called "MyChart".  Sign  up information is provided on this After Visit Summary.  MyChart is used to connect with patients for Virtual Visits (Telemedicine).  Patients are able to view lab/test  results, encounter notes, upcoming appointments, etc.  Non-urgent messages can be sent to your provider as well.   To learn more about what you can do with MyChart, go to [email or web address].    Your next appointment:   4-5 week(s)  The format for your next appointment:   In Person  Provider:    You may see Kate Sable, MD or one of the following Advanced Practice Providers on your designated Care Team:    Murray Hodgkins, NP  Christell Faith, PA-C  Marrianne Mood, PA-C    Other Instructions N/a     Signed, Kate Sable, MD  07/08/2019 4:30 PM    [place]

## 2019-07-15 ENCOUNTER — Encounter: Payer: Self-pay | Admitting: Physician Assistant

## 2019-07-22 DIAGNOSIS — F209 Schizophrenia, unspecified: Secondary | ICD-10-CM | POA: Diagnosis not present

## 2019-07-22 DIAGNOSIS — F324 Major depressive disorder, single episode, in partial remission: Secondary | ICD-10-CM | POA: Diagnosis not present

## 2019-07-24 DIAGNOSIS — E039 Hypothyroidism, unspecified: Secondary | ICD-10-CM | POA: Diagnosis not present

## 2019-07-29 DIAGNOSIS — Z85850 Personal history of malignant neoplasm of thyroid: Secondary | ICD-10-CM | POA: Diagnosis not present

## 2019-07-29 DIAGNOSIS — E890 Postprocedural hypothyroidism: Secondary | ICD-10-CM | POA: Diagnosis not present

## 2019-08-01 ENCOUNTER — Encounter: Payer: Self-pay | Admitting: Physician Assistant

## 2019-08-01 DIAGNOSIS — K219 Gastro-esophageal reflux disease without esophagitis: Secondary | ICD-10-CM

## 2019-08-02 DIAGNOSIS — R002 Palpitations: Secondary | ICD-10-CM | POA: Diagnosis not present

## 2019-08-02 MED ORDER — OMEPRAZOLE 40 MG PO CPDR: 40.0000 mg | DELAYED_RELEASE_CAPSULE | Freq: Every day | ORAL | 3 refills | Status: DC

## 2019-08-05 ENCOUNTER — Telehealth: Payer: Self-pay | Admitting: Physician Assistant

## 2019-08-05 DIAGNOSIS — F209 Schizophrenia, unspecified: Secondary | ICD-10-CM | POA: Diagnosis not present

## 2019-08-05 DIAGNOSIS — F324 Major depressive disorder, single episode, in partial remission: Secondary | ICD-10-CM | POA: Diagnosis not present

## 2019-08-05 NOTE — Telephone Encounter (Signed)
Left message for patient to schedule Annual Wellness Visit. .Please schedule with Nurse Health Advisor McKenzie Markoski, RN. ° °

## 2019-08-08 NOTE — Progress Notes (Signed)
Subjective:   Diamond Lucas is a 59 y.o. female who presents for Medicare Annual (Subsequent) preventive examination.  I connected with Diamond Lucas today by telephone and verified that I am speaking with the correct person using two identifiers. Location patient: home Location provider: work Persons participating in the virtual visit: patient, provider.   I discussed the limitations, risks, security and privacy concerns of performing an evaluation and management service by telephone and the availability of in person appointments. I also discussed with the patient that there may be a patient responsible charge related to this service. The patient expressed understanding and verbally consented to this telephonic visit.    Interactive audio and video telecommunications were attempted between this provider and patient, however failed, due to patient having technical difficulties OR patient did not have access to video capability.  We continued and completed visit with audio only.  Review of Systems:  N/A  Cardiac Risk Factors include: advanced age (>74men, >66 women);hypertension     Objective:     Vitals: LMP 04/10/2015 Comment: Only spotting  There is no height or weight on file to calculate BMI.  Advanced Directives 08/12/2019 02/08/2018 07/12/2017 01/17/2017 09/08/2016 06/29/2016 02/11/2016  Does Patient Have a Medical Advance Directive? Yes No No Yes No Yes Yes  Type of Paramedic of [place];Living will - - - - Living will -  Copy of [place] in Chart? Yes - validated most recent copy scanned in chart (See row information) - - - - - -  Would patient like information on creating a medical advance directive? - - No - Patient declined - No - Patient declined - -  Some encounter information is confidential and restricted. Go to Review Flowsheets activity to see all data.    Tobacco Social History   Tobacco Use  Smoking Status  Never Smoker  Smokeless Tobacco Never Used     Counseling given: Not Answered   Clinical Intake:  Pre-visit preparation completed: Yes  Pain : No/denies pain     Nutritional Risks: Nausea/ vomitting/ diarrhea(Nausea that is ongoing "due to post-menopause pregancy") Diabetes: No  How often do you need to have someone help you when you read instructions, pamphlets, or other written materials from your doctor or pharmacy?: 1 - Never  Interpreter Needed?: No  Information entered by :: [facility], LPN  Past Medical History:  Diagnosis Date  . Allergy   . Arrhythmia   . History of chicken pox   . History of measles   . History of syncope   . Hypertension   . Osteoarthritis   . Palpitations   . Pure hypercholesterolemia   . Rosacea   . Schizophrenia ([place])   . Thyroid disease    Past Surgical History:  Procedure Laterality Date  . COLONOSCOPY WITH PROPOFOL N/A 02/08/2018   Procedure: COLONOSCOPY WITH PROPOFOL;  Surgeon: Virgel Manifold, MD;  Location: [facility] ENDOSCOPY;  Service: Endoscopy;  Laterality: N/A;  . FOOT SURGERY Right   . THYROIDECTOMY     Family History  Problem Relation Age of Onset  . Depression Mother   . Cataracts Mother   . Stroke Mother        mini  . Cancer Other   . Heart failure Other   . Heart disease Other   . Stroke Other   . Diabetes Other   . Kidney cancer Father   . Anxiety disorder Brother    Social History   Socioeconomic History  .  Marital status: Single    Spouse name: Not on file  . Number of children: 0  . Years of education: Not on file  . Highest education level: Bachelor's degree (e.g., BA, AB, BS)  Occupational History  . Occupation: retired  Tobacco Use  . Smoking status: Never Smoker  . Smokeless tobacco: Never Used  Substance and Sexual Activity  . Alcohol use: No  . Drug use: No  . Sexual activity: Not Currently    Birth control/protection: Post-menopausal, Abstinence  Other Topics Concern  . Not on file    Social History Narrative  . Not on file   Social Determinants of Health   Financial Resource Strain: Medium Risk  . Difficulty of Paying Living Expenses: Somewhat hard  Food Insecurity: No Food Insecurity  . Worried About Charity fundraiser in the Last Year: Never true  . Ran Out of Food in the Last Year: Never true  Transportation Needs: No Transportation Needs  . Lack of Transportation (Medical): No  . Lack of Transportation (Non-Medical): No  Physical Activity: Insufficiently Active  . Days of Exercise per Week: 2 days  . Minutes of Exercise per Session: 20 min  Stress: Stress Concern Present  . Feeling of Stress : Very much  Social Connections: Somewhat Isolated  . Frequency of Communication with Friends and Family: Twice a week  . Frequency of Social Gatherings with Friends and Family: More than three times a week  . Attends Religious Services: More than 4 times per year  . Active Member of Clubs or Organizations: No  . Attends Archivist Meetings: Never  . Marital Status: Never married    Outpatient Encounter Medications as of 08/12/2019  Medication Sig  . Calcium Carbonate-Vit D-Min (CALCIUM 600+D PLUS MINERALS) 600-400 MG-UNIT TABS Take by mouth daily.   . Carboxymeth-Glyc-Polysorb PF (REFRESH OPTIVE MEGA-3) 0.5-1-0.5 % SOLN Apply 2 drops to eye 2 (two) times daily.  . cyclobenzaprine (FLEXERIL) 5 MG tablet Take 5 mg by mouth at bedtime as needed. Occasionally takes during the day  . Ibuprofen (MOTRIN PO) Take by mouth as needed (liquid).  Marland Kitchen levothyroxine (SYNTHROID, LEVOTHROID) 75 MCG tablet TAKE 1 TABLET BY MOUTH DAILY  . Multiple Vitamin (MULTI-VITAMINS) TABS daily.   . Omega-3 Fatty Acids (FISH OIL) 1000 MG CAPS Take by mouth. Takes occasionally due to causing acid reflux  . risperiDONE (RISPERDAL) 1 MG tablet Take 1 mg by mouth at bedtime.  . triamcinolone (NASACORT ALLERGY 24HR) 55 MCG/ACT AERO nasal inhaler Place 2 sprays into the nose as needed. Once  daily as needed.  . Eflornithine HCl 13.9 % cream Use twice daily 8 hours apart (Patient not taking: Reported on 08/12/2019)  . omeprazole (PRILOSEC) 40 MG capsule Take 1 capsule (40 mg total) by mouth daily.   No facility-administered encounter medications on file as of 08/12/2019.    Activities of Daily Living In your present state of health, do you have any difficulty performing the following activities: 08/12/2019 05/24/2019  Hearing? N N  Vision? Tempie Donning  Comment Plans to go to [facility] in July for an eye exam. -  Difficulty concentrating or making decisions? N Y  Walking or climbing stairs? Y N  Comment Due to stiffness. -  Dressing or bathing? N N  Doing errands, shopping? N N  Preparing Food and eating ? N -  Using the Toilet? N -  In the past six months, have you accidently leaked urine? N -  Do you  have problems with loss of bowel control? Y -  Comment Occasionally when sneezing, -  Managing your Medications? N -  Managing your Finances? N -  Housekeeping or managing your Housekeeping? N -  Some recent data might be hidden    Patient Care Team: Rubye Beach as PCP - General (Physician Assistant) Kate Sable, MD as PCP - Cardiology (Cardiology) [name], MD as Referring Physician (Obstetrics and Gynecology) Birder Robson, MD as Referring Physician (Ophthalmology) Ward, Honor Loh, MD as Referring Physician (Obstetrics and Gynecology) Charlcie Cradle, MD as Consulting Physician (Psychiatry) Miguel Dibble, LCSW as Social Worker (Licensed Clinical Social Worker) Solum, Betsey Holiday, MD as Physician Assistant (Endocrinology)    Assessment:   This is a routine wellness examination for [place].  Exercise Activities and Dietary recommendations Current Exercise Habits: Structured exercise class, Type of exercise: stretching;treadmill;strength training/weights, Time (Minutes): 20, Frequency (Times/Week): 2, Weekly Exercise (Minutes/Week): 40,  Intensity: Mild, Exercise limited by: None identified  Goals    . DIET - INCREASE WATER INTAKE     Recommend increasing water intake to 6 glasses a day.     . Exercise      Recommend more exercise. Pt to start back going to the gym 3 days a week for an hour.        Fall Risk: Fall Risk  08/12/2019 06/11/2018 07/12/2017 06/29/2016  Falls in the past year? 0 0 No Yes  Number falls in past yr: 0 - - 1  Injury with Fall? 0 - - Yes  Comment - - - stitches, fractured jaw  Risk for fall due to : - - - Medication side effect  Risk for fall due to: Comment - - - due to medication given in hospital  Follow up - - - Falls prevention discussed    [place]:  Any stairs in or around the home? Yes  If so, are there any without handrails? No   Home free of loose throw rugs in walkways, pet beds, electrical cords, etc? Yes  Adequate lighting in your home to reduce risk of falls? Yes   ASSISTIVE DEVICES UTILIZED TO PREVENT FALLS:  Life alert? No  Use of a cane, walker or w/c? No  Grab bars in the bathroom? No  Shower chair or bench in shower? No  Elevated toilet seat or a handicapped toilet? No    TIMED UP AND GO:  Was the test performed? No .    Depression Screen PHQ 2/9 Scores 08/12/2019 06/11/2018 07/12/2017 06/29/2016  PHQ - 2 Score 1 2 1 1      Cognitive Function MMSE - Mini Mental State Exam 05/24/2019 04/13/2017  Orientation to time 5 5  Orientation to Place 5 5  Registration 3 3  Attention/ Calculation 3 5  Recall 0 3  Language- name 2 objects 1 2  Language- repeat 1 1  Language- follow 3 step command 3 3  Language- read & follow direction 1 1  Write a sentence 1 1  Copy design 1 1  Total score 24 30     6CIT Screen 05/24/2019 07/12/2017 04/13/2017 06/29/2016  What Year? 0 points 0 points 0 points 0 points  What month? 0 points 0 points 0 points 0 points  What time? 0 points 0 points 0 points 0 points  Count back from 20 2 points 0 points 0  points 0 points  Months in reverse 2 points 0 points 0 points 0 points  Repeat phrase  2 points 0 points 0 points 0 points  Total Score 6 0 0 0    Immunization History  Administered Date(s) Administered  . HPV Quadrivalent 10/02/2012  . Influenza,inj,Quad PF,6+ Mos 08/20/2012, 01/17/2017, 01/22/2018, 12/04/2018  . PPD Test 06/09/2014  . Pneumococcal Polysaccharide-23 01/21/2019  . Td 07/20/2017  . Tetanus 12/26/2005  . Varicella 12/03/2014     Tdap: Up to date  Flu Vaccine: Up to date   Screening Tests Health Maintenance  Topic Date Due  . COVID-19 Vaccine (1) Never done  . MAMMOGRAM  Never done  . INFLUENZA VACCINE  10/27/2019  . COLONOSCOPY  02/09/2023  . PAP SMEAR-Modifier  04/08/2024  . TETANUS/TDAP  07/21/2027  . Hepatitis C Screening  Completed  . HIV Screening  Completed    Cancer Screenings:  Colorectal Screening: Completed 02/08/18. Repeat every 5 years.   Mammogram: Currently due. Ordered 01/21/19. Pt declined completing a mammogram at this time.  Lung Cancer Screening: (Low Dose CT Chest recommended if Age 61-80 years, 30 pack-year currently smoking OR have quit w/in 15years.) does not qualify.   Additional Screening:  Hepatitis C Screening: Up to date  Vision Screening: Recommended annual ophthalmology exams for early detection of glaucoma and other disorders of the eye.  Dental Screening: Recommended annual dental exams for proper oral hygiene  Community Resource Referral:  CRR required this visit?  No       Plan:  I have personally reviewed and addressed the Medicare Annual Wellness questionnaire and have noted the following in the patient's chart:  A. Medical and social history B. Use of alcohol, tobacco or illicit drugs  C. Current medications and supplements D. Functional ability and status E.  Nutritional status F.  Physical activity G. Advance directives H. List of other physicians I.  Hospitalizations, surgeries, and ER visits in  previous 12 months J.  [place] such as hearing and vision if needed, cognitive and depression L. Referrals and appointments   In addition, I have reviewed and discussed with patient certain preventive protocols, quality metrics, and best practice recommendations. A written personalized care plan for preventive services as well as general preventive health recommendations were provided to patient. Nurse Health Advisor  Signed,    Audia Amick [place], [place]  [phone] Nurse Health Advisor   Nurse Notes: Pt declined completing the mammogram at this time.

## 2019-08-12 ENCOUNTER — Ambulatory Visit (INDEPENDENT_AMBULATORY_CARE_PROVIDER_SITE_OTHER): Payer: Medicare Other

## 2019-08-12 ENCOUNTER — Other Ambulatory Visit: Payer: Self-pay

## 2019-08-12 DIAGNOSIS — Z0000 Encounter for general adult medical examination without abnormal findings: Secondary | ICD-10-CM | POA: Diagnosis not present

## 2019-08-12 NOTE — Patient Instructions (Signed)
Diamond Lucas , Thank you for taking time to come for your Medicare Wellness Visit. I appreciate your ongoing commitment to your health goals. Please review the following plan we discussed and let me know if I can assist you in the future.   Screening recommendations/referrals: Colonoscopy: Up to date, due 01/2023 Mammogram: Currently due. Declined completing mammogram at this time.  Recommended yearly ophthalmology/optometry visit for glaucoma screening and checkup Recommended yearly dental visit for hygiene and checkup  Vaccinations: Influenza vaccine: Up to date Tdap vaccine: Up to date, due 06/2027    Advanced directives: Currently on file  Conditions/risks identified: Recommend to continue to exercise and work towards 3 days a week for at least an hour.  Next appointment: 09/06/19 @ 3:40 PM with Diamond Lucas, Female Preventive care refers to lifestyle choices and visits with your health care provider that can promote health and wellness. What does preventive care include?  A yearly physical exam. This is also called an annual well check.  Dental exams once or twice a year.  Routine eye exams. Ask your health care provider how often you should have your eyes checked.  Personal lifestyle choices, including:  Daily care of your teeth and gums.  Regular physical activity.  Eating a healthy diet.  Avoiding tobacco and drug use.  Limiting alcohol use.  Practicing safe sex.  Taking low-dose aspirin daily starting at age 21.  Taking vitamin and mineral supplements as recommended by your health care provider. What happens during an annual well check? The services and screenings done by your health care provider during your annual well check will depend on your age, overall health, lifestyle risk factors, and family history of disease. Counseling  Your health care provider may ask you questions about your:  Alcohol use.  Tobacco  use.  Drug use.  Emotional well-being.  Home and relationship well-being.  Sexual activity.  Eating habits.  Work and work Statistician.  Method of birth control.  Menstrual cycle.  Pregnancy history. Screening  You may have the following tests or measurements:  Height, weight, and BMI.  Blood pressure.  Lipid and cholesterol levels. These may be checked every 5 Lucas, or more frequently if you are over 80 Lucas old.  Skin check.  Lung cancer screening. You may have this screening every year starting at age 17 if you have a 30-pack-year history of smoking and currently smoke or have quit within the past 15 Lucas.  Fecal occult blood test (FOBT) of the stool. You may have this test every year starting at age 48.  Flexible sigmoidoscopy or colonoscopy. You may have a sigmoidoscopy every 5 Lucas or a colonoscopy every 10 Lucas starting at age 79.  Hepatitis C blood test.  Hepatitis B blood test.  Sexually transmitted disease (STD) testing.  Diabetes screening. This is done by checking your blood sugar (glucose) after you have not eaten for a while (fasting). You may have this done every 1-3 Lucas.  Mammogram. This may be done every 1-2 Lucas. Talk to your health care provider about when you should start having regular mammograms. This may depend on whether you have a family history of breast cancer.  BRCA-related cancer screening. This may be done if you have a family history of breast, ovarian, tubal, or peritoneal cancers.  Pelvic exam and Pap test. This may be done every 3 Lucas starting at age 60. Starting at age 35, this may be done every 5 Lucas if you  have a Pap test in combination with an HPV test.  Bone density scan. This is done to screen for osteoporosis. You may have this scan if you are at high risk for osteoporosis. Discuss your test results, treatment options, and if necessary, the need for more tests with your health care provider. Vaccines  Your  health care provider may recommend certain vaccines, such as:  Influenza vaccine. This is recommended every year.  Tetanus, diphtheria, and acellular pertussis (Tdap, Td) vaccine. You may need a Td booster every 10 Lucas.  Zoster vaccine. You may need this after age 37.  Pneumococcal 13-valent conjugate (PCV13) vaccine. You may need this if you have certain conditions and were not previously vaccinated.  Pneumococcal polysaccharide (PPSV23) vaccine. You may need one or two doses if you smoke cigarettes or if you have certain conditions. Talk to your health care provider about which screenings and vaccines you need and how often you need them. This information is not intended to replace advice given to you by your health care provider. Make sure you discuss any questions you have with your health care provider. Document Released: 04/10/2015 Document Revised: 12/02/2015 Document Reviewed: 01/13/2015 Elsevier Interactive Patient Education  2017 [place] Prevention in the Home Falls can cause injuries. They can happen to people of all ages. There are many things you can do to make your home safe and to help prevent falls. What can I do on the outside of my home?  Regularly fix the edges of walkways and driveways and fix any cracks.  Remove anything that might make you trip as you walk through a door, such as a raised step or threshold.  Trim any bushes or trees on the path to your home.  Use bright outdoor lighting.  Clear any walking paths of anything that might make someone trip, such as rocks or tools.  Regularly check to see if handrails are loose or broken. Make sure that both sides of any steps have handrails.  Any raised decks and porches should have guardrails on the edges.  Have any leaves, snow, or ice cleared regularly.  Use sand or salt on walking paths during winter.  Clean up any spills in your garage right away. This includes oil or grease  spills. What can I do in the bathroom?  Use night lights.  Install grab bars by the toilet and in the tub and shower. Do not use towel bars as grab bars.  Use non-skid mats or decals in the tub or shower.  If you need to sit down in the shower, use a plastic, non-slip stool.  Keep the floor dry. Clean up any water that spills on the floor as soon as it happens.  Remove soap buildup in the tub or shower regularly.  Attach bath mats securely with double-sided non-slip rug tape.  Do not have throw rugs and other things on the floor that can make you trip. What can I do in the bedroom?  Use night lights.  Make sure that you have a light by your bed that is easy to reach.  Do not use any sheets or blankets that are too big for your bed. They should not hang down onto the floor.  Have a firm chair that has side arms. You can use this for support while you get dressed.  Do not have throw rugs and other things on the floor that can make you trip. What can I do in the kitchen?  Clean up any spills right away.  Avoid walking on wet floors.  Keep items that you use a lot in easy-to-reach places.  If you need to reach something above you, use a strong step stool that has a grab bar.  Keep electrical cords out of the way.  Do not use floor polish or wax that makes floors slippery. If you must use wax, use non-skid floor wax.  Do not have throw rugs and other things on the floor that can make you trip. What can I do with my stairs?  Do not leave any items on the stairs.  Make sure that there are handrails on both sides of the stairs and use them. Fix handrails that are broken or loose. Make sure that handrails are as long as the stairways.  Check any carpeting to make sure that it is firmly attached to the stairs. Fix any carpet that is loose or worn.  Avoid having throw rugs at the top or bottom of the stairs. If you do have throw rugs, attach them to the floor with carpet  tape.  Make sure that you have a light switch at the top of the stairs and the bottom of the stairs. If you do not have them, ask someone to add them for you. What else can I do to help prevent falls?  Wear shoes that:  Do not have high heels.  Have rubber bottoms.  Are comfortable and fit you well.  Are closed at the toe. Do not wear sandals.  If you use a stepladder:  Make sure that it is fully opened. Do not climb a closed stepladder.  Make sure that both sides of the stepladder are locked into place.  Ask someone to hold it for you, if possible.  Clearly mark and make sure that you can see:  Any grab bars or handrails.  First and last steps.  Where the edge of each step is.  Use tools that help you move around (mobility aids) if they are needed. These include:  Canes.  Walkers.  Scooters.  Crutches.  Turn on the lights when you go into a dark area. Replace any light bulbs as soon as they burn out.  Set up your furniture so you have a clear path. Avoid moving your furniture around.  If any of your floors are uneven, fix them.  If there are any pets around you, be aware of where they are.  Review your medicines with your doctor. Some medicines can make you feel dizzy. This can increase your chance of falling. Ask your doctor what other things that you can do to help prevent falls. This information is not intended to replace advice given to you by your health care provider. Make sure you discuss any questions you have with your health care provider. Document Released: 01/08/2009 Document Revised: 08/20/2015 Document Reviewed: 04/18/2014 Elsevier Interactive Patient Education  2017 [employer].

## 2019-08-16 ENCOUNTER — Encounter: Payer: Self-pay | Admitting: Cardiology

## 2019-08-16 ENCOUNTER — Ambulatory Visit (INDEPENDENT_AMBULATORY_CARE_PROVIDER_SITE_OTHER): Payer: Medicare Other | Admitting: Cardiology

## 2019-08-16 VITALS — BP 112/70 | HR 71 | Ht 60.5 in | Wt 132.0 lb

## 2019-08-16 DIAGNOSIS — R002 Palpitations: Secondary | ICD-10-CM | POA: Diagnosis not present

## 2019-08-16 DIAGNOSIS — E7800 Pure hypercholesterolemia, unspecified: Secondary | ICD-10-CM | POA: Diagnosis not present

## 2019-08-16 DIAGNOSIS — I340 Nonrheumatic mitral (valve) insufficiency: Secondary | ICD-10-CM

## 2019-08-16 NOTE — Progress Notes (Signed)
Cardiology Office Note:    Date:  08/16/2019   ID:  Diamond Lucas, DOB [birth year], MRN [id number]  PCP:  Mar Daring, PA-C  Cardiologist:  Kate Sable, MD  Electrophysiologist:  None   Referring MD: Florian Buff*   Chief Complaint  Patient presents with  . Other    Follow up post ZIO. Patient c/o being more tired. patient is requesting to have an ECHO done.  Meds reviewed verbally with patient.     History of Present Illness:    Diamond Lucas is a 59 y.o. female with a hx of hypothyroidism, mitral regurgitation, schizophrenia who presents for follow-up.  She was last seen due to palpitations for a month, symptoms usually last 30 seconds.  Cardiac monitor for 2 weeks was placed to evaluate for any significant arrhythmias.  She also has a history of mild mitral regurgitation from an echocardiogram performed in 2015 at [facility].  Echo did show normal systolic and diastolic function.  She states having decreased episodes of palpitations since our last visit.  Has no new concerns at this time   Past Medical History:  Diagnosis Date  . Allergy   . Arrhythmia   . History of chicken pox   . History of measles   . History of syncope   . Hypertension   . Osteoarthritis   . Palpitations   . Pure hypercholesterolemia   . Rosacea   . Schizophrenia ([place])   . Thyroid disease     Past Surgical History:  Procedure Laterality Date  . COLONOSCOPY WITH PROPOFOL N/A 02/08/2018   Procedure: COLONOSCOPY WITH PROPOFOL;  Surgeon: Virgel Manifold, MD;  Location: [facility] ENDOSCOPY;  Service: Endoscopy;  Laterality: N/A;  . FOOT SURGERY Right   . THYROIDECTOMY      Current Medications: Current Meds  Medication Sig  . Calcium Carbonate-Vit D-Min (CALCIUM 600+D PLUS MINERALS) 600-400 MG-UNIT TABS Take by mouth daily.   . Carboxymeth-Glyc-Polysorb PF (REFRESH OPTIVE MEGA-3) 0.5-1-0.5 % SOLN Apply 2 drops to eye 2 (two) times daily.  . cyclobenzaprine  (FLEXERIL) 5 MG tablet Take 5 mg by mouth at bedtime as needed. Occasionally takes during the day  . Eflornithine HCl 13.9 % cream Use twice daily 8 hours apart  . Ibuprofen (MOTRIN PO) Take by mouth as needed (liquid).  Marland Kitchen levothyroxine (SYNTHROID, LEVOTHROID) 75 MCG tablet TAKE 1 TABLET BY MOUTH DAILY  . Multiple Vitamin (MULTI-VITAMINS) TABS daily.   . Omega-3 Fatty Acids (FISH OIL) 1000 MG CAPS Take by mouth. Takes occasionally due to causing acid reflux  . risperiDONE (RISPERDAL) 1 MG tablet Take 1 mg by mouth at bedtime.  . triamcinolone (NASACORT ALLERGY 24HR) 55 MCG/ACT AERO nasal inhaler Place 2 sprays into the nose as needed. Once daily as needed.     Allergies:   Diclofenac sodium, Naproxen, Olanzapine, Gluten meal, Mite (d. farinae), Nsaids, Other, and Synthroid [levothyroxine]   Social History   Socioeconomic History  . Marital status: Single    Spouse name: Not on file  . Number of children: 0  . Years of education: Not on file  . Highest education level: Bachelor's degree (e.g., BA, AB, BS)  Occupational History  . Occupation: retired  Tobacco Use  . Smoking status: Never Smoker  . Smokeless tobacco: Never Used  Substance and Sexual Activity  . Alcohol use: No  . Drug use: No  . Sexual activity: Not Currently    Birth control/protection: Post-menopausal, Abstinence  Other Topics Concern  .  Not on file  Social History Narrative  . Not on file   Social Determinants of Health   Financial Resource Strain: Medium Risk  . Difficulty of Paying Living Expenses: Somewhat hard  Food Insecurity: No Food Insecurity  . Worried About Charity fundraiser in the Last Year: Never true  . Ran Out of Food in the Last Year: Never true  Transportation Needs: No Transportation Needs  . Lack of Transportation (Medical): No  . Lack of Transportation (Non-Medical): No  Physical Activity: Insufficiently Active  . Days of Exercise per Week: 2 days  . Minutes of Exercise per Session:  20 min  Stress: Stress Concern Present  . Feeling of Stress : Very much  Social Connections: Somewhat Isolated  . Frequency of Communication with Friends and Family: Twice a week  . Frequency of Social Gatherings with Friends and Family: More than three times a week  . Attends Religious Services: More than 4 times per year  . Active Member of Clubs or Organizations: No  . Attends Archivist Meetings: Never  . Marital Status: Never married     Family History: The patient's family history includes Anxiety disorder in her brother; Cancer in an other family member; Cataracts in her mother; Depression in her mother; Diabetes in an other family member; Heart disease in an other family member; Heart failure in an other family member; Kidney cancer in her father; Stroke in her mother and another family member.  ROS:   Please see the history of present illness.     All other systems reviewed and are negative.  EKGs/Labs/Other Studies Reviewed:    The following studies were reviewed today:   EKG:  EKG is not ordered today.    Recent Labs: No results found for requested labs within last 8760 hours.  Recent Lipid Panel    Component Value Date/Time   CHOL 214 (H) 10/31/2017 1352   CHOL 205 (H) 07/12/2017 1607   TRIG 229 (H) 10/31/2017 1352   HDL 63 10/31/2017 1352   HDL 85 07/12/2017 1607   CHOLHDL 3.4 10/31/2017 1352   VLDL 46 (H) 10/31/2017 1352   LDLCALC 105 (H) 10/31/2017 1352   LDLCALC 107 (H) 07/12/2017 1607    Physical Exam:    VS:  BP 112/70 (BP Location: Left Arm, Patient Position: Sitting, Cuff Size: Normal)   Pulse 71   Ht 5' 0.5" (1.537 m)   Wt 132 lb (59.9 kg)   LMP 04/10/2015 Comment: Only spotting  SpO2 98%   BMI 25.36 kg/m     Wt Readings from Last 3 Encounters:  08/16/19 132 lb (59.9 kg)  07/08/19 130 lb 2 oz (59 kg)  07/01/19 130 lb (59 kg)     GEN:  Well nourished, well developed in no acute distress HEENT: Normal NECK: No JVD; No carotid  bruits LYMPHATICS: No lymphadenopathy CARDIAC: RRR, no murmurs, rubs, gallops RESPIRATORY:  Clear to auscultation without rales, wheezing or rhonchi  ABDOMEN: Soft, non-tender, non-distended MUSCULOSKELETAL:  No edema; No deformity  SKIN: Warm and dry NEUROLOGIC:  Alert and oriented x 3 PSYCHIATRIC:  Normal affect   ASSESSMENT:    1. Mitral valve insufficiency, unspecified etiology   2. Palpitations   3. Pure hypercholesterolemia    PLAN:    In order of problems listed above:  1. Patient with history of mild mitral regurgitation from echocardiogram back in 2015.  ACC AHA guidelines typically recommends 3 to 5 years for repeat echo.  We will  repeat echocardiogram to evaluate valvular dysfunction for any progression. 2. Patient with a month history of palpitations.  2-week cardiac monitor with no significant arrhythmias.  Patient reassured. 3. Elevated total cholesterol and LDL levels.  10-year ASCVD risk score was low.  Patient not in aspirin or statin benefit category.  Continue low-cholesterol diet.  Follow-up after echocardiogram  This note was generated in part or whole with voice recognition software. Voice recognition is usually quite accurate but there are transcription errors that can and very often do occur. I apologize for any typographical errors that were not detected and corrected.  Medication Adjustments/Labs and Tests Ordered: Current medicines are reviewed at length with the patient today.  Concerns regarding medicines are outlined above.  Orders Placed This Encounter  Procedures  . ECHOCARDIOGRAM COMPLETE   No orders of the defined types were placed in this encounter.   Patient Instructions  Medication Instructions:  - Your physician recommends that you continue on your current medications as directed. Please refer to the Current Medication list given to you today.  *If you need a refill on your cardiac medications before your next appointment, please call  your pharmacy*   Lab Work: - none ordered  If you have labs (blood work) drawn today and your tests are completely normal, you will receive your results only by: Marland Kitchen MyChart Message (if you have MyChart) OR . A paper copy in the mail If you have any lab test that is abnormal or we need to change your treatment, we will call you to review the results.   Testing/Procedures: - Your physician has requested that you have an echocardiogram. Echocardiography is a painless test that uses sound waves to create images of your heart. It provides your doctor with information about the size and shape of your heart and how well your heart's chambers and valves are working. This procedure takes approximately one hour. There are no restrictions for this procedure.   Follow-Up: At [facility], you and your health needs are our priority.  As part of our continuing mission to provide you with exceptional heart care, we have created designated Provider Care Teams.  These Care Teams include your primary Cardiologist (physician) and Advanced Practice Providers (APPs -  Physician Assistants and Nurse Practitioners) who all work together to provide you with the care you need, when you need it.  We recommend signing up for the patient portal called "MyChart".  Sign up information is provided on this After Visit Summary.  MyChart is used to connect with patients for Virtual Visits (Telemedicine).  Patients are able to view lab/test results, encounter notes, upcoming appointments, etc.  Non-urgent messages can be sent to your provider as well.   To learn more about what you can do with MyChart, go to [email or web address].    Your next appointment:   After your echocardiogram is completed   The format for your next appointment:   In Person  Provider:   Kate Sable, MD   Other Instructions   Echocardiogram An echocardiogram is a procedure that uses painless sound waves (ultrasound) to produce an  image of the heart. Images from an echocardiogram can provide important information about:  Signs of coronary artery disease (CAD).  Aneurysm detection. An aneurysm is a weak or damaged part of an artery wall that bulges out from the normal force of blood pumping through the body.  Heart size and shape. Changes in the size or shape of the heart can be associated with certain conditions,  including heart failure, aneurysm, and CAD.  Heart muscle function.  Heart valve function.  Signs of a past heart attack.  Fluid buildup around the heart.  Thickening of the heart muscle.  A tumor or infectious growth around the heart valves. Tell a health care provider about:  Any allergies you have.  All medicines you are taking, including vitamins, herbs, eye drops, creams, and over-the-counter medicines.  Any blood disorders you have.  Any surgeries you have had.  Any medical conditions you have.  Whether you are pregnant or may be pregnant. What are the risks? Generally, this is a safe procedure. However, problems may occur, including:  Allergic reaction to dye (contrast) that may be used during the procedure. What happens before the procedure? No specific preparation is needed. You may eat and drink normally. What happens during the procedure?   An IV tube may be inserted into one of your veins.  You may receive contrast through this tube. A contrast is an injection that improves the quality of the pictures from your heart.  A gel will be applied to your chest.  A wand-like tool (transducer) will be moved over your chest. The gel will help to transmit the sound waves from the transducer.  The sound waves will harmlessly bounce off of your heart to allow the heart images to be captured in real-time motion. The images will be recorded on a computer. The procedure may vary among health care providers and hospitals. What happens after the procedure?  You may return to your  normal, everyday life, including diet, activities, and medicines, unless your health care provider tells you not to do that. Summary  An echocardiogram is a procedure that uses painless sound waves (ultrasound) to produce an image of the heart.  Images from an echocardiogram can provide important information about the size and shape of your heart, heart muscle function, heart valve function, and fluid buildup around your heart.  You do not need to do anything to prepare before this procedure. You may eat and drink normally.  After the echocardiogram is completed, you may return to your normal, everyday life, unless your health care provider tells you not to do that. This information is not intended to replace advice given to you by your health care provider. Make sure you discuss any questions you have with your health care provider. Document Revised: 07/05/2018 Document Reviewed: 04/16/2016 Elsevier Patient Education  2020 [place], Kate Sable, MD  08/16/2019 4:58 PM    [place]

## 2019-08-16 NOTE — Patient Instructions (Signed)
Medication Instructions:  - Your physician recommends that you continue on your current medications as directed. Please refer to the Current Medication list given to you today.  *If you need a refill on your cardiac medications before your next appointment, please call your pharmacy*   Lab Work: - none ordered  If you have labs (blood work) drawn today and your tests are completely normal, you will receive your results only by: Marland Kitchen MyChart Message (if you have MyChart) OR . A paper copy in the mail If you have any lab test that is abnormal or we need to change your treatment, we will call you to review the results.   Testing/Procedures: - Your physician has requested that you have an echocardiogram. Echocardiography is a painless test that uses sound waves to create images of your heart. It provides your doctor with information about the size and shape of your heart and how well your heart's chambers and valves are working. This procedure takes approximately one hour. There are no restrictions for this procedure.   Follow-Up: At [facility], you and your health needs are our priority.  As part of our continuing mission to provide you with exceptional heart care, we have created designated Provider Care Teams.  These Care Teams include your primary Cardiologist (physician) and Advanced Practice Providers (APPs -  Physician Assistants and Nurse Practitioners) who all work together to provide you with the care you need, when you need it.  We recommend signing up for the patient portal called "MyChart".  Sign up information is provided on this After Visit Summary.  MyChart is used to connect with patients for Virtual Visits (Telemedicine).  Patients are able to view lab/test results, encounter notes, upcoming appointments, etc.  Non-urgent messages can be sent to your provider as well.   To learn more about what you can do with MyChart, go to [email or web address].    Your next appointment:     After your echocardiogram is completed   The format for your next appointment:   In Person  Provider:   Kate Sable, MD   Other Instructions   Echocardiogram An echocardiogram is a procedure that uses painless sound waves (ultrasound) to produce an image of the heart. Images from an echocardiogram can provide important information about:  Signs of coronary artery disease (CAD).  Aneurysm detection. An aneurysm is a weak or damaged part of an artery wall that bulges out from the normal force of blood pumping through the body.  Heart size and shape. Changes in the size or shape of the heart can be associated with certain conditions, including heart failure, aneurysm, and CAD.  Heart muscle function.  Heart valve function.  Signs of a past heart attack.  Fluid buildup around the heart.  Thickening of the heart muscle.  A tumor or infectious growth around the heart valves. Tell a health care provider about:  Any allergies you have.  All medicines you are taking, including vitamins, herbs, eye drops, creams, and over-the-counter medicines.  Any blood disorders you have.  Any surgeries you have had.  Any medical conditions you have.  Whether you are pregnant or may be pregnant. What are the risks? Generally, this is a safe procedure. However, problems may occur, including:  Allergic reaction to dye (contrast) that may be used during the procedure. What happens before the procedure? No specific preparation is needed. You may eat and drink normally. What happens during the procedure?   An IV tube may be inserted into  one of your veins.  You may receive contrast through this tube. A contrast is an injection that improves the quality of the pictures from your heart.  A gel will be applied to your chest.  A wand-like tool (transducer) will be moved over your chest. The gel will help to transmit the sound waves from the transducer.  The sound waves will  harmlessly bounce off of your heart to allow the heart images to be captured in real-time motion. The images will be recorded on a computer. The procedure may vary among health care providers and hospitals. What happens after the procedure?  You may return to your normal, everyday life, including diet, activities, and medicines, unless your health care provider tells you not to do that. Summary  An echocardiogram is a procedure that uses painless sound waves (ultrasound) to produce an image of the heart.  Images from an echocardiogram can provide important information about the size and shape of your heart, heart muscle function, heart valve function, and fluid buildup around your heart.  You do not need to do anything to prepare before this procedure. You may eat and drink normally.  After the echocardiogram is completed, you may return to your normal, everyday life, unless your health care provider tells you not to do that. This information is not intended to replace advice given to you by your health care provider. Make sure you discuss any questions you have with your health care provider. Document Revised: 07/05/2018 Document Reviewed: 04/16/2016 Elsevier Patient Education  [place].

## 2019-08-21 DIAGNOSIS — F209 Schizophrenia, unspecified: Secondary | ICD-10-CM | POA: Diagnosis not present

## 2019-08-21 DIAGNOSIS — F324 Major depressive disorder, single episode, in partial remission: Secondary | ICD-10-CM | POA: Diagnosis not present

## 2019-08-25 ENCOUNTER — Encounter: Payer: Self-pay | Admitting: Physician Assistant

## 2019-08-28 ENCOUNTER — Ambulatory Visit (INDEPENDENT_AMBULATORY_CARE_PROVIDER_SITE_OTHER): Payer: Medicare Other | Admitting: Physician Assistant

## 2019-08-28 ENCOUNTER — Other Ambulatory Visit: Payer: Self-pay

## 2019-08-28 ENCOUNTER — Encounter: Payer: Self-pay | Admitting: Physician Assistant

## 2019-08-28 ENCOUNTER — Ambulatory Visit
Admission: RE | Admit: 2019-08-28 | Discharge: 2019-08-28 | Disposition: A | Discharge status: Home / Self Care | Payer: Medicare Other | Source: Ambulatory Visit | Attending: Physician Assistant | Admitting: Physician Assistant

## 2019-08-28 VITALS — BP 112/76 | HR 69 | Temp 96.9°F | Resp 16 | Wt 132.4 lb

## 2019-08-28 DIAGNOSIS — M25562 Pain in left knee: Secondary | ICD-10-CM | POA: Insufficient documentation

## 2019-08-28 DIAGNOSIS — M25561 Pain in right knee: Secondary | ICD-10-CM

## 2019-08-28 IMAGING — CR DG KNEE COMPLETE 4+V*L*
5 series · 5 of 5 positions shown · non-contrast
Comparison: None.

CLINICAL DATA: Left knee pain for several months.

EXAM:
LEFT KNEE - COMPLETE 4+ VIEW

[knee ap]
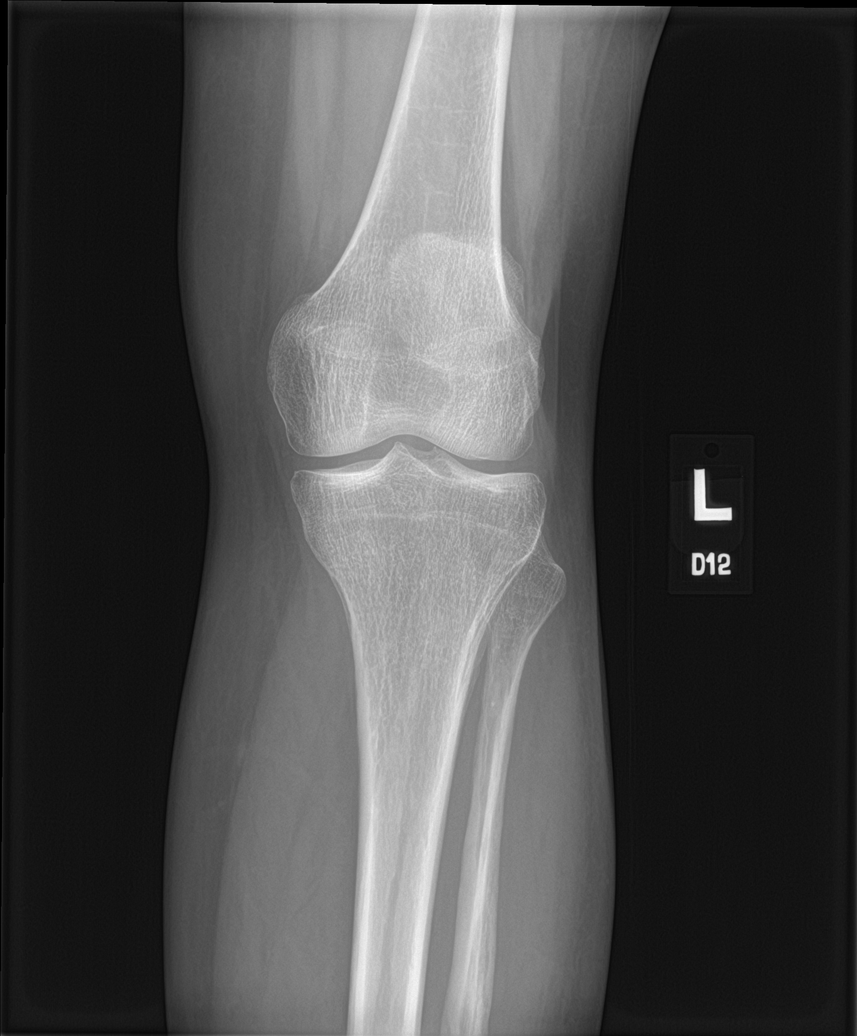

[knee obl (1 of 2)]
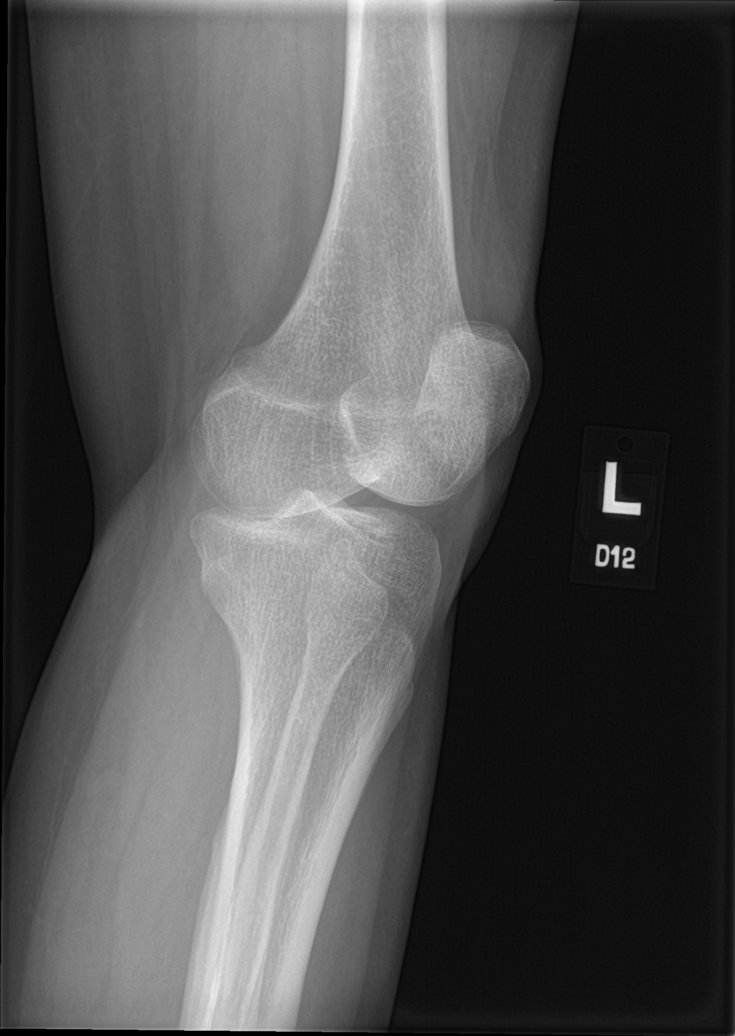

[knee obl (2 of 2)]
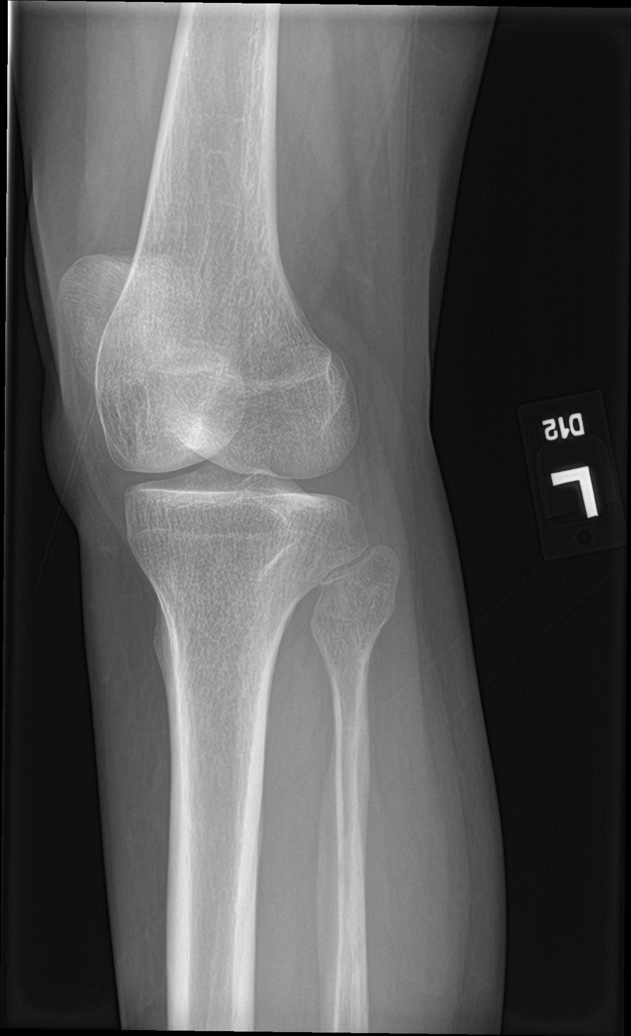

[knee lat]
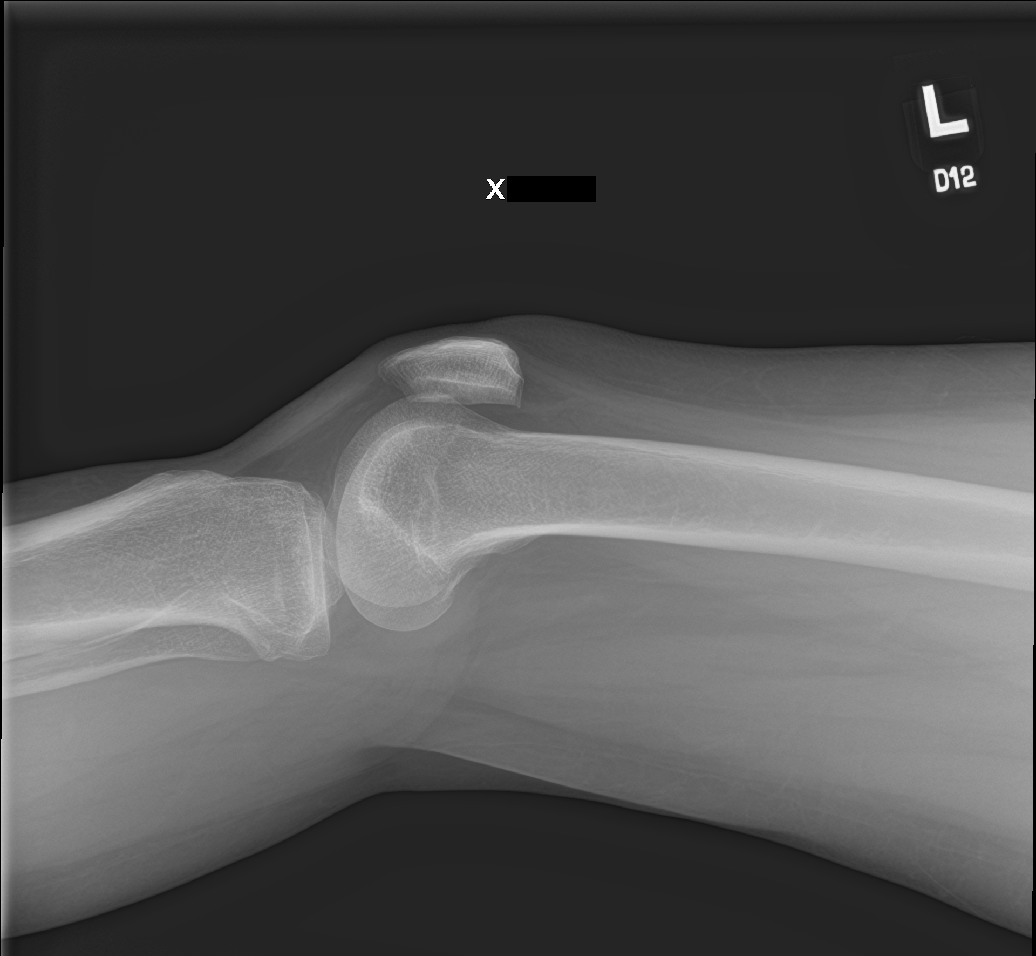

[sunrise]
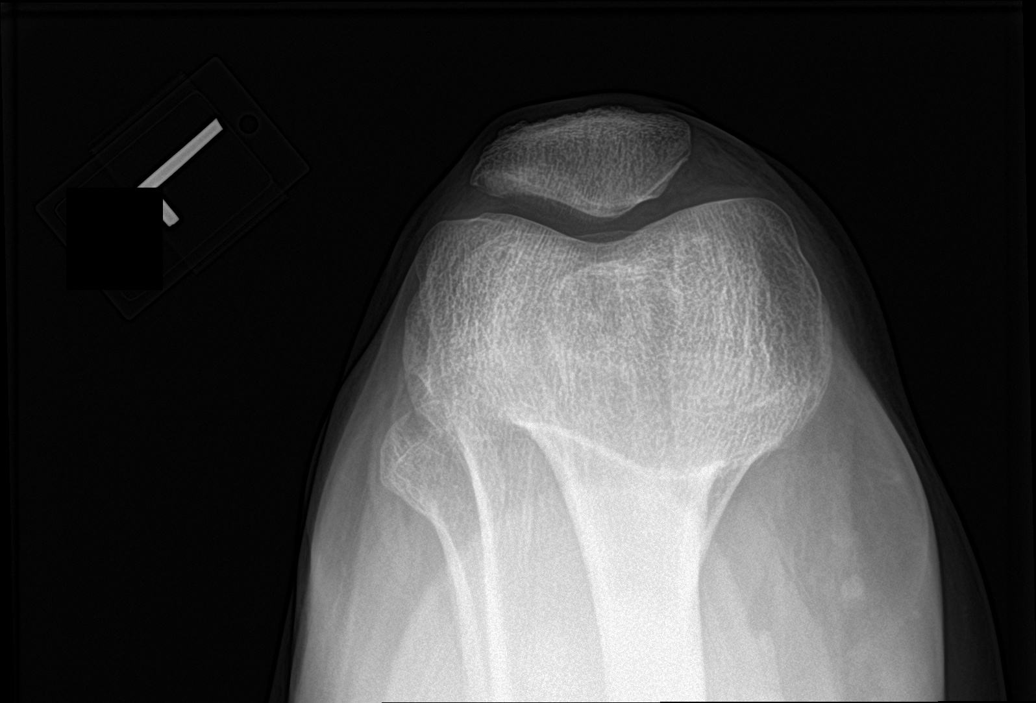

[5 of 5 positions shown; findings below may reference images not displayed]

FINDINGS: No evidence of fracture, dislocation, or joint effusion. No evidence
of arthropathy or other focal bone abnormality. Soft tissues are
unremarkable.
IMPRESSION: Negative.

## 2019-08-28 NOTE — Progress Notes (Signed)
Established patient visit   Patient: Diamond Lucas   DOB: November 04, 1960   59 y.o. Female  MRN: [id number] Visit Date: 08/28/2019  Today's healthcare provider: Mar Daring, PA-C   No chief complaint on file.  Subjective    Knee Pain  Incident onset: reports it has been hurting off an on for a year on the left side and now right knee for the past 3 weeks. There was no injury mechanism. The pain is present in the right knee and left knee (more the right knee). The quality of the pain is described as aching. The pain is at a severity of 8/10. The pain is moderate. The pain has been intermittent since onset. Associated symptoms include an inability to bear weight (in the morning, right knee with difficulty). Pertinent negatives include no numbness or tingling. She reports no foreign bodies present. Nothing aggravates the symptoms. Treatments tried: [place].  She wants to have some diagnostics done.  Patient Active Problem List   Diagnosis Date Noted  . 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 Encounter for screening colonoscopy   . Polyp of sigmoid colon   . Benign neoplasm of cecum   . Hypertrophy of anal papillae   . Chronic venous insufficiency 10/07/2017  . Lymphedema 10/07/2017  . Delusion of infestation ([place]) 09/15/2016  . Postoperative hypothyroidism 05/17/2016  . Bipolar I disorder, most recent episode manic, severe with psychotic features ([place]) 02/17/2016  . Delusional disorder, somatic type ([place]) 10/07/2015  . HTN (hypertension) 10/07/2015  . Noncompliance 10/06/2015  . Rosacea 02/09/2015  . Rhinitis, allergic 12/26/2014  . 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 Reflux 09/26/2012  . Hypothyroidism, unspecified 03/13/2012  . Hirsutism 04/11/2011   Past Medical History:  Diagnosis Date  . Allergy   . Arrhythmia   . History of chicken pox   . History of measles   . History of  syncope   . Hypertension   . Osteoarthritis   . Palpitations   . Pure hypercholesterolemia   . Rosacea   . Schizophrenia ([place])   . Thyroid disease        Medications: Outpatient Medications Prior to Visit  Medication Sig  . Calcium Carbonate-Vit D-Min (CALCIUM 600+D PLUS MINERALS) 600-400 MG-UNIT TABS Take by mouth daily.   . Carboxymeth-Glyc-Polysorb PF (REFRESH OPTIVE MEGA-3) 0.5-1-0.5 % SOLN Apply 2 drops to eye 2 (two) times daily.  . cyclobenzaprine (FLEXERIL) 5 MG tablet Take 5 mg by mouth at bedtime as needed. Occasionally takes during the day  . Eflornithine HCl 13.9 % cream Use twice daily 8 hours apart  . Ibuprofen (MOTRIN PO) Take by mouth as needed (liquid).  Marland Kitchen levothyroxine (SYNTHROID, LEVOTHROID) 75 MCG tablet TAKE 1 TABLET BY MOUTH DAILY  . Multiple Vitamin (MULTI-VITAMINS) TABS daily.   . Omega-3 Fatty Acids (FISH OIL) 1000 MG CAPS Take by mouth. Takes occasionally due to causing acid reflux  . risperiDONE (RISPERDAL) 1 MG tablet Take 1 mg by mouth at bedtime.  . triamcinolone (NASACORT ALLERGY 24HR) 55 MCG/ACT AERO nasal inhaler Place 2 sprays into the nose as needed. Once daily as needed.   No facility-administered medications prior to visit.    Review of Systems  Constitutional: Negative.   Respiratory: Negative.   Cardiovascular: Negative.   Musculoskeletal: Positive for arthralgias, gait problem and joint swelling (mild).  Neurological: Negative for tingling, weakness and numbness.    Last CBC Lab Results  Component Value Date  WBC 8.1 10/31/2017   HGB 14.8 10/31/2017   HCT 42.8 10/31/2017   MCV 91.8 10/31/2017   MCH 31.8 10/31/2017   RDW 13.0 10/31/2017   PLT 236 [id number]   Last metabolic panel Lab Results  Component Value Date   GLUCOSE 104 (H) 10/31/2017   NA 140 10/31/2017   K 3.8 10/31/2017   CL 102 10/31/2017   CO2 30 10/31/2017   BUN 16 10/31/2017   CREATININE 0.89 10/31/2017   GFRNONAA >60 10/31/2017   GFRAA >60 10/31/2017    CALCIUM 9.0 10/31/2017   PROT 7.3 10/31/2017   ALBUMIN 4.4 10/31/2017   LABGLOB 2.7 07/12/2017   AGRATIO 1.6 07/12/2017   BILITOT 0.4 10/31/2017   ALKPHOS 84 10/31/2017   AST 26 10/31/2017   ALT 18 10/31/2017   ANIONGAP 8 10/31/2017      Objective    BP 112/76 (BP Location: Left Arm, Patient Position: Sitting, Cuff Size: Large)   Pulse 69   Temp (!) 96.9 F (36.1 C) (Temporal)   Resp 16   Wt 132 lb 6.4 oz (60.1 kg)   LMP 04/10/2015 Comment: Only spotting  BMI 25.43 kg/m  BP Readings from Last 3 Encounters:  08/28/19 112/76  08/16/19 112/70  07/08/19 128/70   Wt Readings from Last 3 Encounters:  08/28/19 132 lb 6.4 oz (60.1 kg)  08/16/19 132 lb (59.9 kg)  07/08/19 130 lb 2 oz (59 kg)      Physical Exam Vitals reviewed.  Constitutional:      General: She is not in acute distress.    Appearance: Normal appearance. She is well-developed and normal weight. She is not ill-appearing or diaphoretic.  Cardiovascular:     Rate and Rhythm: Normal rate and regular rhythm.     Heart sounds: Normal heart sounds. No murmur. No friction rub. No gallop.   Pulmonary:     Effort: Pulmonary effort is normal. No respiratory distress.     Breath sounds: Normal breath sounds. No wheezing or rales.  Musculoskeletal:     Cervical back: Normal range of motion and neck supple.     Right knee: Effusion (mild effusion under patella), bony tenderness and crepitus present. No swelling or erythema. Normal range of motion. Tenderness present over the medial joint line and lateral joint line. No MCL, LCL, ACL, PCL or patellar tendon tenderness. No LCL laxity, MCL laxity, ACL laxity or PCL laxity. Normal alignment, normal meniscus and normal patellar mobility. Normal pulse.     Instability Tests: Negative medial McMurray test and negative lateral McMurray test.     Left knee: Bony tenderness and crepitus present. No swelling, effusion or erythema. Normal range of motion. Tenderness present over the  medial joint line and lateral joint line. No MCL, LCL, ACL, PCL or patellar tendon tenderness. No LCL laxity, MCL laxity, ACL laxity or PCL laxity.Normal alignment, normal meniscus and normal patellar mobility. Normal pulse.     Instability Tests: Negative medial McMurray test and negative lateral McMurray test.  Neurological:     Mental Status: She is alert.      No results found for any visits on 08/28/19.  Assessment & Plan     1. Acute pain of both knees Suspect patellofemoral syndrome due to positive patellar grind test. Will get xrays as below. Advised may use voltaren gel or salonpas (diclofenac) gel topically up to four times per day. I will f/u pending results. Will give exercises and stretches on AVS. Call if not improving or worsening and  will refer to orthopedics vs PT or both. - DG Knee 4 Views W/Patella Left; Future - DG Knee 4 Views W/Patella Right; Future   No follow-ups on file.      Reynolds Bowl, PA-C, have reviewed all documentation for this visit. The documentation on 09/03/19 for the exam, diagnosis, procedures, and orders are all accurate and complete.   Rubye Beach  [facility] [phone] (phone) [phone] (fax)  [place]

## 2019-08-28 NOTE — Patient Instructions (Signed)
Tumeric 500mg  daily Glucosamine-Chondroitin Hyaluronic acid Magnesium Voltaren or Salonpas gel (diclofenac gel)   Journal for Nurse Practitioners, 15(4), [phone]. Retrieved January 01, 2018 from http://clinicalkey.com/nursing">  Knee Exercises Ask your health care provider which exercises are safe for you. Do exercises exactly as told by your health care provider and adjust them as directed. It is normal to feel mild stretching, pulling, tightness, or discomfort as you do these exercises. Stop right away if you feel sudden pain or your pain gets worse. Do not begin these exercises until told by your health care provider. Stretching and range-of-motion exercises These exercises warm up your muscles and joints and improve the movement and flexibility of your knee. These exercises also help to relieve pain and swelling. Knee extension, prone 1. Lie on your abdomen (prone position) on a bed. 2. Place your left / right knee just beyond the edge of the surface so your knee is not on the bed. You can put a towel under your left / right thigh just above your kneecap for comfort. 3. Relax your leg muscles and allow gravity to straighten your knee (extension). You should feel a stretch behind your left / right knee. 4. Hold this position for __________ seconds. 5. Scoot up so your knee is supported between repetitions. Repeat __________ times. Complete this exercise __________ times a day. Knee flexion, active  1. Lie on your back with both legs straight. If this causes back discomfort, bend your left / right knee so your foot is flat on the floor. 2. Slowly slide your left / right heel back toward your buttocks. Stop when you feel a gentle stretch in the front of your knee or thigh (flexion). 3. Hold this position for __________ seconds. 4. Slowly slide your left / right heel back to the starting position. Repeat __________ times. Complete this exercise __________ times a day. Quadriceps stretch,  prone  1. Lie on your abdomen on a firm surface, such as a bed or padded floor. 2. Bend your left / right knee and hold your ankle. If you cannot reach your ankle or pant leg, loop a belt around your foot and grab the belt instead. 3. Gently pull your heel toward your buttocks. Your knee should not slide out to the side. You should feel a stretch in the front of your thigh and knee (quadriceps). 4. Hold this position for __________ seconds. Repeat __________ times. Complete this exercise __________ times a day. Hamstring, supine 1. Lie on your back (supine position). 2. Loop a belt or towel over the ball of your left / right foot. The ball of your foot is on the walking surface, right under your toes. 3. Straighten your left / right knee and slowly pull on the belt to raise your leg until you feel a gentle stretch behind your knee (hamstring). ? Do not let your knee bend while you do this. ? Keep your other leg flat on the floor. 4. Hold this position for __________ seconds. Repeat __________ times. Complete this exercise __________ times a day. Strengthening exercises These exercises build strength and endurance in your knee. Endurance is the ability to use your muscles for a long time, even after they get tired. Quadriceps, isometric This exercise stretches the muscles in front of your thigh (quadriceps) without moving your knee joint (isometric). 1. Lie on your back with your left / right leg extended and your other knee bent. Put a rolled towel or small pillow under your knee if told by your  health care provider. 2. Slowly tense the muscles in the front of your left / right thigh. You should see your kneecap slide up toward your hip or see increased dimpling just above the knee. This motion will push the back of the knee toward the floor. 3. For __________ seconds, hold the muscle as tight as you can without increasing your pain. 4. Relax the muscles slowly and completely. Repeat  __________ times. Complete this exercise __________ times a day. Straight leg raises This exercise stretches the muscles in front of your thigh (quadriceps) and the muscles that move your hips (hip flexors). 1. Lie on your back with your left / right leg extended and your other knee bent. 2. Tense the muscles in the front of your left / right thigh. You should see your kneecap slide up or see increased dimpling just above the knee. Your thigh may even shake a bit. 3. Keep these muscles tight as you raise your leg 4-6 inches (10-15 cm) off the floor. Do not let your knee bend. 4. Hold this position for __________ seconds. 5. Keep these muscles tense as you lower your leg. 6. Relax your muscles slowly and completely after each repetition. Repeat __________ times. Complete this exercise __________ times a day. Hamstring, isometric 1. Lie on your back on a firm surface. 2. Bend your left / right knee about __________ degrees. 3. Dig your left / right heel into the surface as if you are trying to pull it toward your buttocks. Tighten the muscles in the back of your thighs (hamstring) to "dig" as hard as you can without increasing any pain. 4. Hold this position for __________ seconds. 5. Release the tension gradually and allow your muscles to relax completely for __________ seconds after each repetition. Repeat __________ times. Complete this exercise __________ times a day. Hamstring curls If told by your health care provider, do this exercise while wearing ankle weights. Begin with __________ lb weights. Then increase the weight by 1 lb (0.5 kg) increments. Do not wear ankle weights that are more than __________ lb. 1. Lie on your abdomen with your legs straight. 2. Bend your left / right knee as far as you can without feeling pain. Keep your hips flat against the floor. 3. Hold this position for __________ seconds. 4. Slowly lower your leg to the starting position. Repeat __________ times.  Complete this exercise __________ times a day. Squats This exercise strengthens the muscles in front of your thigh and knee (quadriceps). 1. Stand in front of a table, with your feet and knees pointing straight ahead. You may rest your hands on the table for balance but not for support. 2. Slowly bend your knees and lower your hips like you are going to sit in a chair. ? Keep your weight over your heels, not over your toes. ? Keep your lower legs upright so they are parallel with the table legs. ? Do not let your hips go lower than your knees. ? Do not bend lower than told by your health care provider. ? If your knee pain increases, do not bend as low. 3. Hold the squat position for __________ seconds. 4. Slowly push with your legs to return to standing. Do not use your hands to pull yourself to standing. Repeat __________ times. Complete this exercise __________ times a day. Wall slides This exercise strengthens the muscles in front of your thigh and knee (quadriceps). 1. Lean your back against a smooth wall or door, and walk your  feet out 18-24 inches (46-61 cm) from it. 2. Place your feet hip-width apart. 3. Slowly slide down the wall or door until your knees bend __________ degrees. Keep your knees over your heels, not over your toes. Keep your knees in line with your hips. 4. Hold this position for __________ seconds. Repeat __________ times. Complete this exercise __________ times a day. Straight leg raises This exercise strengthens the muscles that rotate the leg at the hip and move it away from your body (hip abductors). 1. Lie on your side with your left / right leg in the top position. Lie so your head, shoulder, knee, and hip line up. You may bend your bottom knee to help you keep your balance. 2. Roll your hips slightly forward so your hips are stacked directly over each other and your left / right knee is facing forward. 3. Leading with your heel, lift your top leg 4-6 inches  (10-15 cm). You should feel the muscles in your outer hip lifting. ? Do not let your foot drift forward. ? Do not let your knee roll toward the ceiling. 4. Hold this position for __________ seconds. 5. Slowly return your leg to the starting position. 6. Let your muscles relax completely after each repetition. Repeat __________ times. Complete this exercise __________ times a day. Straight leg raises This exercise stretches the muscles that move your hips away from the front of the pelvis (hip extensors). 1. Lie on your abdomen on a firm surface. You can put a pillow under your hips if that is more comfortable. 2. Tense the muscles in your buttocks and lift your left / right leg about 4-6 inches (10-15 cm). Keep your knee straight as you lift your leg. 3. Hold this position for __________ seconds. 4. Slowly lower your leg to the starting position. 5. Let your leg relax completely after each repetition. Repeat __________ times. Complete this exercise __________ times a day. This information is not intended to replace advice given to you by your health care provider. Make sure you discuss any questions you have with your health care provider. Document Revised: 01/02/2018 Document Reviewed: 01/02/2018 Elsevier Patient Education  2020 [employer].

## 2019-08-29 ENCOUNTER — Encounter: Payer: Self-pay | Admitting: Physician Assistant

## 2019-08-29 ENCOUNTER — Telehealth: Payer: Self-pay

## 2019-08-29 NOTE — Telephone Encounter (Signed)
-----   Message from Mar Daring, [place] sent at 08/29/2019  1:05 PM EDT ----- Left knee xray appears to be normal. No significant arthritic changes. Could use voltaren or salonpas gel OTC for knee pain as we discussed.

## 2019-08-29 NOTE — Telephone Encounter (Signed)
Patient advised as directed below. 

## 2019-09-05 NOTE — Progress Notes (Signed)
Established patient visit   Patient: Diamond Lucas   DOB: 1960-07-19   59 y.o. Female  MRN: [id number] Visit Date: 09/06/2019  Today's healthcare provider: Mar Daring, PA-C   Chief Complaint  Patient presents with  . Follow-up  . Hypothyroidism   Subjective    HPI Patient had AWV with [facility] on 08/12/19.  Delusional disorder, somatic type: stable  Hypothyroid, follow-up  Lab Results  Component Value Date   TSH 0.063 (L) 10/31/2017   TSH 0.630 07/12/2017   TSH 0.38 (L) 02/10/2017   FREET4 1.31 11/02/2017   T4TOTAL 12.0 (H) 02/10/2017   T4TOTAL 11.0 11/14/2014   Wt Readings from Last 3 Encounters:  09/06/19 133 lb (60.3 kg)  08/28/19 132 lb 6.4 oz (60.1 kg)  08/16/19 132 lb (59.9 kg)    She was last seen for hypothyroid 1 years ago.  Management since that visit includes checking labs. She reports excellent compliance with treatment. She is not having side effects.   Symptoms: No change in energy level Yes constipation  No diarrhea No heat / cold intolerance  Yes nervousness No palpitations  No weight changes    -----------------------------------------------------------------------------------------   Patient Active Problem List   Diagnosis Date Noted  . 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 Encounter for screening colonoscopy   . Polyp of sigmoid colon   . Benign neoplasm of cecum   . Hypertrophy of anal papillae   . Chronic venous insufficiency 10/07/2017  . Lymphedema 10/07/2017  . Delusion of infestation ([place]) 09/15/2016  . Postoperative hypothyroidism 05/17/2016  . Bipolar I disorder, most recent episode manic, severe with psychotic features ([place]) 02/17/2016  . Delusional disorder, somatic type ([place]) 10/07/2015  . HTN (hypertension) 10/07/2015  . Noncompliance 10/06/2015  . Rosacea 02/09/2015  . Rhinitis, allergic 12/26/2014  . 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 Trichorrhexis  05/28/2013  . Reflux 09/26/2012  . Hypothyroidism, unspecified 03/13/2012  . Hirsutism 04/11/2011   Social History   Socioeconomic History  . Marital status: Single    Spouse name: Not on file  . Number of children: 0  . Years of education: Not on file  . Highest education level: Bachelor's degree (e.g., BA, AB, BS)  Occupational History  . Occupation: retired  Tobacco Use  . Smoking status: Never Smoker  . Smokeless tobacco: Never Used  Vaping Use  . Vaping Use: Never used  Substance and Sexual Activity  . Alcohol use: No  . Drug use: No  . Sexual activity: Not Currently    Birth control/protection: Post-menopausal, Abstinence  Other Topics Concern  . Not on file  Social History Narrative  . Not on file   Social Determinants of Health   Financial Resource Strain: Medium Risk  . Difficulty of Paying Living Expenses: Somewhat hard  Food Insecurity: No Food Insecurity  . Worried About Charity fundraiser in the Last Year: Never true  . Ran Out of Food in the Last Year: Never true  Transportation Needs: No Transportation Needs  . Lack of Transportation (Medical): No  . Lack of Transportation (Non-Medical): No  Physical Activity: Insufficiently Active  . Days of Exercise per Week: 2 days  . Minutes of Exercise per Session: 20 min  Stress: Stress Concern Present  . Feeling of Stress : Very much  Social Connections: Moderately Isolated  . Frequency of Communication with Friends and Family: Twice a week  . Frequency of Social Gatherings with Friends and Family: More  than three times a week  . Attends Religious Services: More than 4 times per year  . Active Member of Clubs or Organizations: No  . Attends Archivist Meetings: Never  . Marital Status: Never married  Intimate Partner Violence: Not At Risk  . Fear of Current or Ex-Partner: No  . Emotionally Abused: No  . Physically Abused: No  . Sexually Abused: No       Medications: Outpatient Medications  Prior to Visit  Medication Sig  . Calcium Carbonate-Vit D-Min (CALCIUM 600+D PLUS MINERALS) 600-400 MG-UNIT TABS Take by mouth daily.   . Carboxymeth-Glyc-Polysorb PF (REFRESH OPTIVE MEGA-3) 0.5-1-0.5 % SOLN Apply 2 drops to eye 2 (two) times daily.  . cyclobenzaprine (FLEXERIL) 5 MG tablet Take 5 mg by mouth at bedtime as needed. Occasionally takes during the day  . Eflornithine HCl 13.9 % cream Use twice daily 8 hours apart  . Ibuprofen (MOTRIN PO) Take by mouth as needed (liquid).  Marland Kitchen levothyroxine (SYNTHROID, LEVOTHROID) 75 MCG tablet TAKE 1 TABLET BY MOUTH DAILY  . Multiple Vitamin (MULTI-VITAMINS) TABS daily.   . Omega-3 Fatty Acids (FISH OIL) 1000 MG CAPS Take by mouth. Takes occasionally due to causing acid reflux  . risperiDONE (RISPERDAL) 1 MG tablet Take 1 mg by mouth at bedtime.  . triamcinolone (NASACORT ALLERGY 24HR) 55 MCG/ACT AERO nasal inhaler Place 2 sprays into the nose as needed. Once daily as needed.   No facility-administered medications prior to visit.    Review of Systems  Constitutional: Negative for chills and fever.  Respiratory: Negative for cough, shortness of breath and wheezing.   Cardiovascular: Negative for chest pain, palpitations and leg swelling.  Gastrointestinal: Positive for constipation.  Musculoskeletal: Positive for myalgias.  Psychiatric/Behavioral: Positive for hallucinations.    Last CBC Lab Results  Component Value Date   WBC 8.1 10/31/2017   HGB 14.8 10/31/2017   HCT 42.8 10/31/2017   MCV 91.8 10/31/2017   MCH 31.8 10/31/2017   RDW 13.0 10/31/2017   PLT 236 53/29/9242   Last metabolic panel Lab Results  Component Value Date   GLUCOSE 104 (H) 10/31/2017   NA 140 10/31/2017   K 3.8 10/31/2017   CL 102 10/31/2017   CO2 30 10/31/2017   BUN 16 10/31/2017   CREATININE 0.89 10/31/2017   GFRNONAA >60 10/31/2017   GFRAA >60 10/31/2017   CALCIUM 9.0 10/31/2017   PROT 7.3 10/31/2017   ALBUMIN 4.4 10/31/2017   LABGLOB 2.7 07/12/2017    AGRATIO 1.6 07/12/2017   BILITOT 0.4 10/31/2017   ALKPHOS 84 10/31/2017   AST 26 10/31/2017   ALT 18 10/31/2017   ANIONGAP 8 10/31/2017   Last lipids Lab Results  Component Value Date   CHOL 214 (H) 10/31/2017   HDL 63 10/31/2017   LDLCALC 105 (H) 10/31/2017   TRIG 229 (H) 10/31/2017   CHOLHDL 3.4 10/31/2017   Last hemoglobin A1c Lab Results  Component Value Date   HGBA1C 5.1 10/31/2017   Last thyroid functions Lab Results  Component Value Date   TSH 0.063 (L) 10/31/2017   T3TOTAL 112 11/02/2017   T4TOTAL 12.0 (H) 02/10/2017      Objective    BP 104/72 (BP Location: Left Arm, Patient Position: Sitting, Cuff Size: Normal)   Pulse 65   Temp (!) 96.9 F (36.1 C) (Temporal)   Resp 16   Ht 5' (1.524 m)   Wt 133 lb (60.3 kg)   LMP 04/10/2015 Comment: Only spotting  BMI 25.97 kg/m  BP Readings from Last 3 Encounters:  09/06/19 104/72  08/28/19 112/76  08/16/19 112/70   Wt Readings from Last 3 Encounters:  09/06/19 133 lb (60.3 kg)  08/28/19 132 lb 6.4 oz (60.1 kg)  08/16/19 132 lb (59.9 kg)      Physical Exam Vitals reviewed.  Constitutional:      General: She is not in acute distress.    Appearance: Normal appearance. She is well-developed. She is not ill-appearing or diaphoretic.  Cardiovascular:     Rate and Rhythm: Normal rate and regular rhythm.     Pulses: Normal pulses.     Heart sounds: Normal heart sounds. No murmur heard.  No friction rub. No gallop.   Pulmonary:     Effort: Pulmonary effort is normal. No respiratory distress.     Breath sounds: Normal breath sounds. No wheezing or rales.  Musculoskeletal:     Cervical back: Normal range of motion and neck supple.     Right lower leg: No edema.     Left lower leg: No edema.  Neurological:     Mental Status: She is alert.       No results found for any visits on 09/06/19.  Assessment & Plan     1. Memory loss Noted to have a decrease in her memory on the 6CIT screening done  annually. Most decrease was noted on recall. Will refer to Neurology for further evaluation. She does have known post op hypothyroidism (had thyroid carcinoma) followed by Dr. Gabriel Carina, endocrine. She also has delusional disorder (most often delusion of infestations), and bipolar I.  - Ambulatory referral to Neurology  2. Delusional disorder, somatic type ([place]) Still active. Has delusions of being impregnated with bats. Asked about rabies vaccine, but this has been done previously when it was thought she may have had bats in her apartment. There was a letter from environmental services which is why they were given.    Return if symptoms worsen or fail to improve.      Reynolds Bowl, PA-C, have reviewed all documentation for this visit. The documentation on 09/10/19 for the exam, diagnosis, procedures, and orders are all accurate and complete.   Rubye Beach  [facility] [phone] (phone) [phone] (fax)  [place]

## 2019-09-06 ENCOUNTER — Encounter: Payer: Self-pay | Admitting: Physician Assistant

## 2019-09-06 ENCOUNTER — Other Ambulatory Visit: Payer: Self-pay

## 2019-09-06 ENCOUNTER — Ambulatory Visit (INDEPENDENT_AMBULATORY_CARE_PROVIDER_SITE_OTHER): Payer: Medicare Other | Admitting: Physician Assistant

## 2019-09-06 VITALS — BP 104/72 | HR 65 | Temp 96.9°F | Resp 16 | Ht 60.0 in | Wt 133.0 lb

## 2019-09-06 DIAGNOSIS — F22 Delusional disorders: Secondary | ICD-10-CM

## 2019-09-06 DIAGNOSIS — R413 Other amnesia: Secondary | ICD-10-CM | POA: Diagnosis not present

## 2019-09-06 NOTE — Patient Instructions (Signed)
Aricept Namenda Prevagen

## 2019-09-09 ENCOUNTER — Encounter: Payer: Self-pay | Admitting: Physician Assistant

## 2019-09-11 DIAGNOSIS — F209 Schizophrenia, unspecified: Secondary | ICD-10-CM | POA: Diagnosis not present

## 2019-09-11 DIAGNOSIS — F324 Major depressive disorder, single episode, in partial remission: Secondary | ICD-10-CM | POA: Diagnosis not present

## 2019-09-25 ENCOUNTER — Other Ambulatory Visit: Payer: Self-pay

## 2019-09-25 ENCOUNTER — Ambulatory Visit (INDEPENDENT_AMBULATORY_CARE_PROVIDER_SITE_OTHER): Payer: Medicare Other

## 2019-09-25 DIAGNOSIS — I340 Nonrheumatic mitral (valve) insufficiency: Secondary | ICD-10-CM | POA: Diagnosis not present

## 2019-10-04 ENCOUNTER — Encounter: Payer: Self-pay | Admitting: Cardiology

## 2019-10-04 ENCOUNTER — Encounter: Payer: Self-pay | Admitting: Physician Assistant

## 2019-10-04 ENCOUNTER — Other Ambulatory Visit: Payer: Self-pay

## 2019-10-04 ENCOUNTER — Ambulatory Visit (INDEPENDENT_AMBULATORY_CARE_PROVIDER_SITE_OTHER): Payer: Medicare Other | Admitting: Cardiology

## 2019-10-04 VITALS — BP 110/76 | HR 61 | Ht 60.0 in | Wt 133.5 lb

## 2019-10-04 DIAGNOSIS — E7800 Pure hypercholesterolemia, unspecified: Secondary | ICD-10-CM

## 2019-10-04 DIAGNOSIS — I340 Nonrheumatic mitral (valve) insufficiency: Secondary | ICD-10-CM | POA: Diagnosis not present

## 2019-10-04 NOTE — Progress Notes (Signed)
Cardiology Office Note:    Date:  10/04/2019   ID:  Diamond Lucas, DOB Jul 23, 1960, MRN [id number]  PCP:  Mar Daring, PA-C  Cardiologist:  Kate Sable, MD  Electrophysiologist:  None   Referring MD: Florian Buff*   Chief Complaint  Patient presents with  . other    Follow up Echo. Meds reviewed by the pt. verbally. "doing well."     History of Present Illness:    Diamond Lucas is a 59 y.o. female with a hx of hypothyroidism, mitral regurgitation, schizophrenia who presents for follow-up.  Originally seen for palpitations, 2-week cardiac monitor did not show any significant arrhythmias.  She has a history of mild mitral regurgitation from echo in 2015, echocardiogram was ordered to evaluate progression of disease.  She is doing okay otherwise, no new concerns.    Past Medical History:  Diagnosis Date  . Allergy   . Arrhythmia   . History of chicken pox   . History of measles   . History of syncope   . Hypertension   . Osteoarthritis   . Palpitations   . Pure hypercholesterolemia   . Rosacea   . Schizophrenia ([place])   . Thyroid disease     Past Surgical History:  Procedure Laterality Date  . COLONOSCOPY WITH PROPOFOL N/A 02/08/2018   Procedure: COLONOSCOPY WITH PROPOFOL;  Surgeon: Virgel Manifold, MD;  Location: [facility] ENDOSCOPY;  Service: Endoscopy;  Laterality: N/A;  . FOOT SURGERY Right   . THYROIDECTOMY      Current Medications: Current Meds  Medication Sig  . Calcium Carbonate-Vit D-Min (CALCIUM 600+D PLUS MINERALS) 600-400 MG-UNIT TABS Take by mouth daily.   . Carboxymeth-Glyc-Polysorb PF (REFRESH OPTIVE MEGA-3) 0.5-1-0.5 % SOLN Apply 2 drops to eye 2 (two) times daily.  . cyclobenzaprine (FLEXERIL) 5 MG tablet Take 5 mg by mouth at bedtime as needed. Occasionally takes during the day  . Eflornithine HCl 13.9 % cream Use twice daily 8 hours apart  . Ibuprofen (MOTRIN PO) Take by mouth as needed (liquid).  Marland Kitchen  levothyroxine (SYNTHROID, LEVOTHROID) 75 MCG tablet TAKE 1 TABLET BY MOUTH DAILY  . Multiple Vitamin (MULTI-VITAMINS) TABS daily.   . Omega-3 Fatty Acids (FISH OIL) 1000 MG CAPS Take by mouth. Takes occasionally due to causing acid reflux  . risperiDONE (RISPERDAL) 1 MG tablet Take 1 mg by mouth at bedtime.  . triamcinolone (NASACORT ALLERGY 24HR) 55 MCG/ACT AERO nasal inhaler Place 2 sprays into the nose as needed. Once daily as needed.     Allergies:   Diclofenac sodium, Naproxen, Olanzapine, Gluten meal, Mite (d. farinae), Nsaids, Other, and Synthroid [levothyroxine]   Social History   Socioeconomic History  . Marital status: Single    Spouse name: Not on file  . Number of children: 0  . Years of education: Not on file  . Highest education level: Bachelor's degree (e.g., BA, AB, BS)  Occupational History  . Occupation: retired  Tobacco Use  . Smoking status: Never Smoker  . Smokeless tobacco: Never Used  Vaping Use  . Vaping Use: Never used  Substance and Sexual Activity  . Alcohol use: No  . Drug use: No  . Sexual activity: Not Currently    Birth control/protection: Post-menopausal, Abstinence  Other Topics Concern  . Not on file  Social History Narrative  . Not on file   Social Determinants of Health   Financial Resource Strain: Medium Risk  . Difficulty of Paying Living Expenses: Somewhat hard  Food Insecurity: No Food Insecurity  . Worried About Charity fundraiser in the Last Year: Never true  . Ran Out of Food in the Last Year: Never true  Transportation Needs: No Transportation Needs  . Lack of Transportation (Medical): No  . Lack of Transportation (Non-Medical): No  Physical Activity: Insufficiently Active  . Days of Exercise per Week: 2 days  . Minutes of Exercise per Session: 20 min  Stress: Stress Concern Present  . Feeling of Stress : Very much  Social Connections: Moderately Isolated  . Frequency of Communication with Friends and Family: Twice a week    . Frequency of Social Gatherings with Friends and Family: More than three times a week  . Attends Religious Services: More than 4 times per year  . Active Member of Clubs or Organizations: No  . Attends Archivist Meetings: Never  . Marital Status: Never married     Family History: The patient's family history includes Anxiety disorder in her brother; Cancer in an other family member; Cataracts in her mother; Depression in her mother; Diabetes in an other family member; Heart disease in an other family member; Heart failure in an other family member; Kidney cancer in her father; Stroke in her mother and another family member.  ROS:   Please see the history of present illness.     All other systems reviewed and are negative.  EKGs/Labs/Other Studies Reviewed:    The following studies were reviewed today:   EKG:  EKG is ordered today.  Shows normal sinus rhythm, normal ECG.  Recent Labs: No results found for requested labs within last 8760 hours.  Recent Lipid Panel    Component Value Date/Time   CHOL 214 (H) 10/31/2017 1352   CHOL 205 (H) 07/12/2017 1607   TRIG 229 (H) 10/31/2017 1352   HDL 63 10/31/2017 1352   HDL 85 07/12/2017 1607   CHOLHDL 3.4 10/31/2017 1352   VLDL 46 (H) 10/31/2017 1352   LDLCALC 105 (H) 10/31/2017 1352   LDLCALC 107 (H) 07/12/2017 1607    Physical Exam:    VS:  BP 110/76 (BP Location: Left Arm, Patient Position: Sitting, Cuff Size: Normal)   Pulse 61   Ht 5' (1.524 m)   Wt 133 lb 8 oz (60.6 kg)   LMP 04/10/2015 Comment: Only spotting  SpO2 98%   BMI 26.07 kg/m     Wt Readings from Last 3 Encounters:  10/04/19 133 lb 8 oz (60.6 kg)  09/06/19 133 lb (60.3 kg)  08/28/19 132 lb 6.4 oz (60.1 kg)     GEN:  Well nourished, well developed in no acute distress HEENT: Normal NECK: No JVD; No carotid bruits LYMPHATICS: No lymphadenopathy CARDIAC: RRR, no murmurs, rubs, gallops RESPIRATORY:  Clear to auscultation without rales,  wheezing or rhonchi  ABDOMEN: Soft, non-tender, non-distended MUSCULOSKELETAL:  No edema; No deformity  SKIN: Warm and dry NEUROLOGIC:  Alert and oriented x 3 PSYCHIATRIC:  Normal affect   ASSESSMENT:    1. Mitral valve insufficiency, unspecified etiology   2. Pure hypercholesterolemia    PLAN:    In order of problems listed above:  1. Patient with history of mild mitral regurgitation from echocardiogram back in 2015.  Follow-up of echocardiogram showed trivial to mild MR, no significant change from prior.  Patient reassured.  2. History of elevated total cholesterol and LDL levels.  10-year ASCVD risk score was low.  Low-cholesterol diet recommended.  Follow-up in 1 year  This note was  generated in part or whole with voice recognition software. Voice recognition is usually quite accurate but there are transcription errors that can and very often do occur. I apologize for any typographical errors that were not detected and corrected.  Medication Adjustments/Labs and Tests Ordered: Current medicines are reviewed at length with the patient today.  Concerns regarding medicines are outlined above.  Orders Placed This Encounter  Procedures  . EKG 12-Lead   No orders of the defined types were placed in this encounter.   Patient Instructions  Medication Instructions:   Your physician recommends that you continue on your current medications as directed. Please refer to the Current Medication list given to you today.  *If you need a refill on your cardiac medications before your next appointment, please call your pharmacy*   Lab Work: None Ordered If you have labs (blood work) drawn today and your tests are completely normal, you will receive your results only by: Marland Kitchen MyChart Message (if you have MyChart) OR . A paper copy in the mail If you have any lab test that is abnormal or we need to change your treatment, we will call you to review the results.   Testing/Procedures: None  Ordered   Follow-Up: At [facility], you and your health needs are our priority.  As part of our continuing mission to provide you with exceptional heart care, we have created designated Provider Care Teams.  These Care Teams include your primary Cardiologist (physician) and Advanced Practice Providers (APPs -  Physician Assistants and Nurse Practitioners) who all work together to provide you with the care you need, when you need it.  We recommend signing up for the patient portal called "MyChart".  Sign up information is provided on this After Visit Summary.  MyChart is used to connect with patients for Virtual Visits (Telemedicine).  Patients are able to view lab/test results, encounter notes, upcoming appointments, etc.  Non-urgent messages can be sent to your provider as well.   To learn more about what you can do with MyChart, go to [email or web address].    Your next appointment:   1 year(s)  The format for your next appointment:   In Person  Provider:   Kate Sable, MD   Other Instructions N/A     Signed, Kate Sable, MD  10/04/2019 4:26 PM    [place]

## 2019-10-04 NOTE — Patient Instructions (Signed)
Medication Instructions:   Your physician recommends that you continue on your current medications as directed. Please refer to the Current Medication list given to you today.  *If you need a refill on your cardiac medications before your next appointment, please call your pharmacy*   Lab Work: None Ordered If you have labs (blood work) drawn today and your tests are completely normal, you will receive your results only by: Marland Kitchen MyChart Message (if you have MyChart) OR . A paper copy in the mail If you have any lab test that is abnormal or we need to change your treatment, we will call you to review the results.   Testing/Procedures: None Ordered   Follow-Up: At [facility], you and your health needs are our priority.  As part of our continuing mission to provide you with exceptional heart care, we have created designated Provider Care Teams.  These Care Teams include your primary Cardiologist (physician) and Advanced Practice Providers (APPs -  Physician Assistants and Nurse Practitioners) who all work together to provide you with the care you need, when you need it.  We recommend signing up for the patient portal called "MyChart".  Sign up information is provided on this After Visit Summary.  MyChart is used to connect with patients for Virtual Visits (Telemedicine).  Patients are able to view lab/test results, encounter notes, upcoming appointments, etc.  Non-urgent messages can be sent to your provider as well.   To learn more about what you can do with MyChart, go to [email or web address].    Your next appointment:   1 year(s)  The format for your next appointment:   In Person  Provider:   Kate Sable, MD   Other Instructions N/A

## 2019-10-07 ENCOUNTER — Encounter: Payer: Self-pay | Admitting: Physician Assistant

## 2019-10-07 DIAGNOSIS — F22 Delusional disorders: Secondary | ICD-10-CM

## 2019-10-07 DIAGNOSIS — E7800 Pure hypercholesterolemia, unspecified: Secondary | ICD-10-CM

## 2019-10-07 DIAGNOSIS — M26629 Arthralgia of temporomandibular joint, unspecified side: Secondary | ICD-10-CM

## 2019-10-07 DIAGNOSIS — R519 Headache, unspecified: Secondary | ICD-10-CM

## 2019-10-07 DIAGNOSIS — I10 Essential (primary) hypertension: Secondary | ICD-10-CM

## 2019-10-09 LAB — LIPID PANEL WITH LDL/HDL RATIO
Cholesterol, Total: 215 mg/dL — ABNORMAL HIGH (ref 100–199)
HDL: 48 mg/dL (ref >39)
LDL Chol Calc (NIH): 133 mg/dL — ABNORMAL HIGH (ref 0–99)
LDL/HDL Ratio: 2.8 ratio (ref 0.0–3.2)
Triglycerides: 192 mg/dL — ABNORMAL HIGH (ref 0–149)
VLDL Cholesterol Cal: 34 mg/dL (ref 5–40)

## 2019-10-09 LAB — COMPREHENSIVE METABOLIC PANEL
ALT: 28 IU/L (ref 0–32)
AST: 22 IU/L (ref 0–40)
Albumin/Globulin Ratio: 1.6 (ref 1.2–2.2)
Albumin: 4.1 g/dL (ref 3.8–4.9)
Alkaline Phosphatase: 100 IU/L (ref 48–121)
BUN/Creatinine Ratio: 23 (ref 9–23)
BUN: 22 mg/dL (ref 6–24)
Bilirubin Total: 0.2 mg/dL (ref 0.0–1.2)
CO2: 27 mmol/L (ref 20–29)
Calcium: 8.8 mg/dL (ref 8.7–10.2)
Chloride: 100 mmol/L (ref 96–106)
Creatinine, Ser: 0.96 mg/dL (ref 0.57–1.00)
GFR calc Af Amer: 76 mL/min/{1.73_m2} (ref >59)
GFR calc non Af Amer: 66 mL/min/{1.73_m2} (ref >59)
Globulin, Total: 2.6 g/dL (ref 1.5–4.5)
Glucose: 81 mg/dL (ref 65–99)
Potassium: 4.7 mmol/L (ref 3.5–5.2)
Sodium: 139 mmol/L (ref 134–144)
Total Protein: 6.7 g/dL (ref 6.0–8.5)

## 2019-10-09 LAB — SEDIMENTATION RATE: Sed Rate: 11 mm/hr (ref 0–40)

## 2019-10-17 DIAGNOSIS — F324 Major depressive disorder, single episode, in partial remission: Secondary | ICD-10-CM | POA: Diagnosis not present

## 2019-10-17 DIAGNOSIS — F209 Schizophrenia, unspecified: Secondary | ICD-10-CM | POA: Diagnosis not present

## 2019-10-25 ENCOUNTER — Encounter: Payer: Self-pay | Admitting: Physician Assistant

## 2019-11-13 DIAGNOSIS — F200 Paranoid schizophrenia: Secondary | ICD-10-CM | POA: Diagnosis not present

## 2019-11-13 DIAGNOSIS — E538 Deficiency of other specified B group vitamins: Secondary | ICD-10-CM | POA: Diagnosis not present

## 2019-11-13 DIAGNOSIS — Z79899 Other long term (current) drug therapy: Secondary | ICD-10-CM | POA: Diagnosis not present

## 2019-11-13 DIAGNOSIS — R413 Other amnesia: Secondary | ICD-10-CM | POA: Diagnosis not present

## 2019-11-13 DIAGNOSIS — E519 Thiamine deficiency, unspecified: Secondary | ICD-10-CM | POA: Diagnosis not present

## 2019-11-14 ENCOUNTER — Other Ambulatory Visit (HOSPITAL_COMMUNITY): Payer: Self-pay | Admitting: Neurology

## 2019-11-14 ENCOUNTER — Other Ambulatory Visit: Payer: Self-pay | Admitting: Neurology

## 2019-11-14 DIAGNOSIS — R413 Other amnesia: Secondary | ICD-10-CM

## 2019-11-15 DIAGNOSIS — F324 Major depressive disorder, single episode, in partial remission: Secondary | ICD-10-CM | POA: Diagnosis not present

## 2019-11-15 DIAGNOSIS — F209 Schizophrenia, unspecified: Secondary | ICD-10-CM | POA: Diagnosis not present

## 2019-11-22 NOTE — Telephone Encounter (Signed)
Pt called in and stated that she needs to make an appt with Dr. Amalia Hailey. The pt stated that this appointment is to have bats removed. I told the pt that I will have to send a message to the nurse, that I'm sorry I don't know how long that appointment will take. The pt verbally understood. I told the pt to please allow 24- 48 hours for a reply. Please advise.   I didn't know what to label this visit and how long he will need

## 2019-11-23 ENCOUNTER — Ambulatory Visit (HOSPITAL_COMMUNITY)
Admission: RE | Admit: 2019-11-23 | Discharge: 2019-11-23 | Disposition: A | Discharge status: Home / Self Care | Payer: Medicare Other | Source: Ambulatory Visit | Attending: Neurology | Admitting: Neurology

## 2019-11-23 ENCOUNTER — Other Ambulatory Visit: Payer: Self-pay

## 2019-11-23 DIAGNOSIS — R413 Other amnesia: Secondary | ICD-10-CM | POA: Insufficient documentation

## 2019-11-23 DIAGNOSIS — R930 Abnormal findings on diagnostic imaging of skull and head, not elsewhere classified: Secondary | ICD-10-CM | POA: Diagnosis not present

## 2019-11-23 IMAGING — MR MR HEAD W/O CM
14 of 15 series · 45 of 48 positions shown · non-contrast
Comparison: Head CT [DATE]

CLINICAL DATA: Memory loss

EXAM:
MRI HEAD WITHOUT CONTRAST
TECHNIQUE: Multiplanar, multiecho pulse sequences of the brain and surrounding
structures were obtained without intravenous contrast.
Additionally, using NeuroQuant software a 3D volumetric analysis of
the brain was performed and is compared to a normative database
adjusted for age, gender and intracranial volume.

[Series 2: T1 · sagittal · 1.2mm · 0.94mm/px · 5 of 156 slices shown]
[im 1/156]
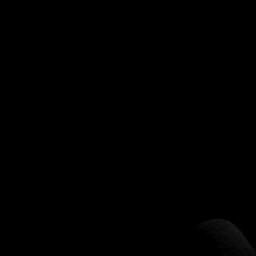
[im 39/156]
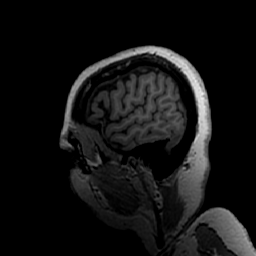
[im 78/156]
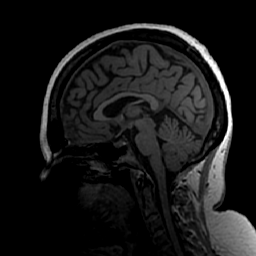
[im 117/156]
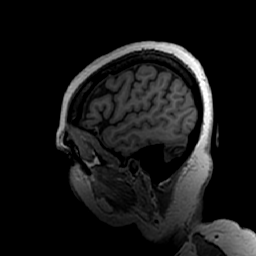
[im 156/156]
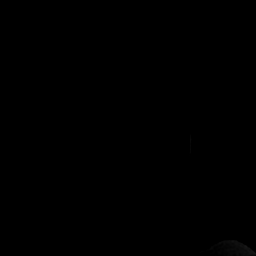

[Series 6: DWI · coronal · 4.0mm · 0.94mm/px · 2 of 70 slices shown (1 of 2)]
[im 1/70]
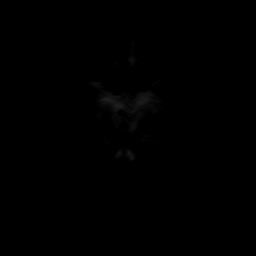
[im 70/70]
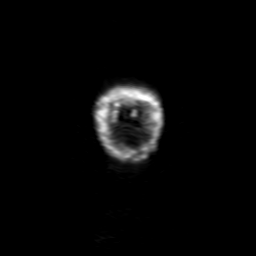

[Series 7: DWI · axial · 3.0mm · 0.94mm/px · z∈[-151,-5]mm · 4 of 104 slices shown (2 of 2)]
[im 1/104]
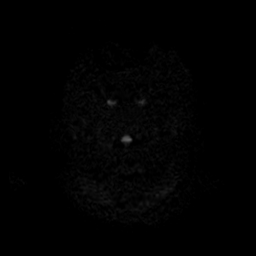
[im 35/104]
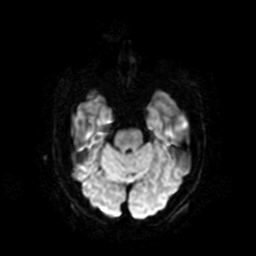
[im 69/104]
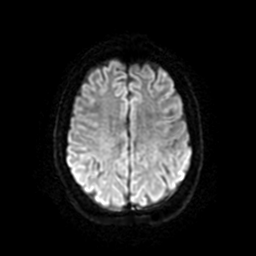
[im 104/104]
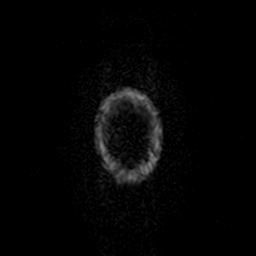

[Series 8: FLAIR · sagittal · 5.0mm · 0.47mm/px · 1 of 23 slices shown (1 of 2)]
[im 1/23]
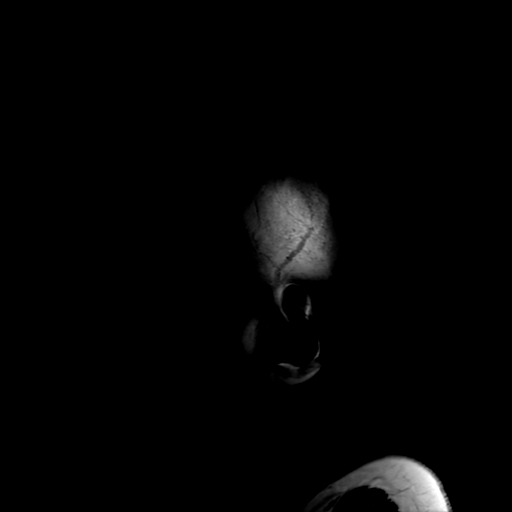

[Series 9: FLAIR · axial · 3.0mm · 0.41mm/px · 1 of 26 slices shown (2 of 2)]
[im 1/26]
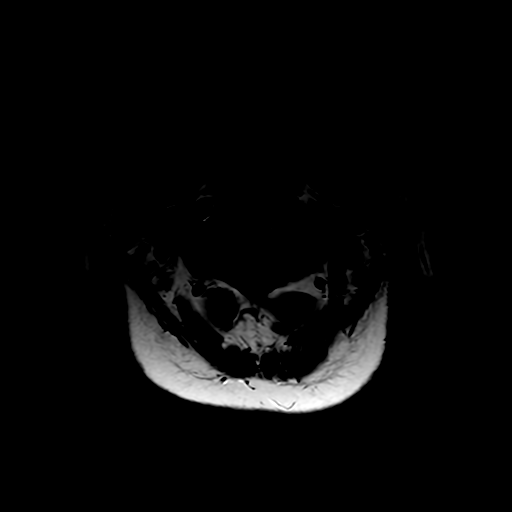

[Series 10: T2 · axial · 5.0mm · 0.47mm/px · 1 of 26 slices shown (1 of 2)]
[im 1/26]
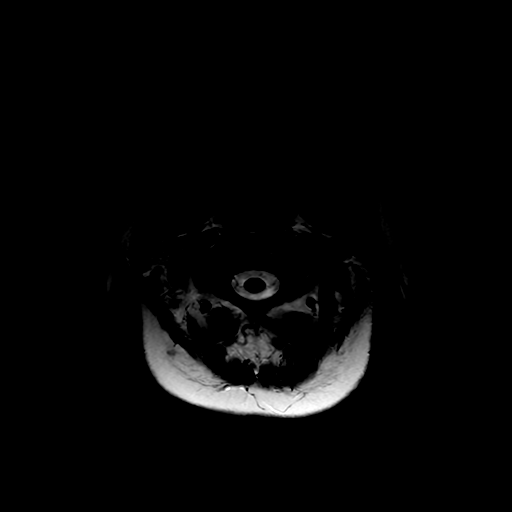

[Series 11: (person_name) · axial · 3.0mm · 0.47mm/px · z∈[-151,-3]mm · 4 of 104 slices shown]
[im 1/104]
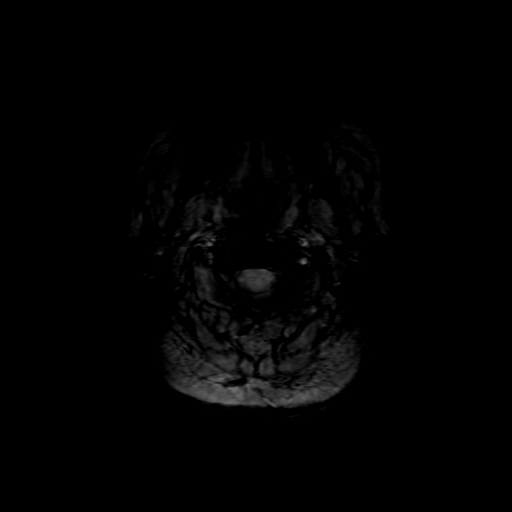
[im 35/104]
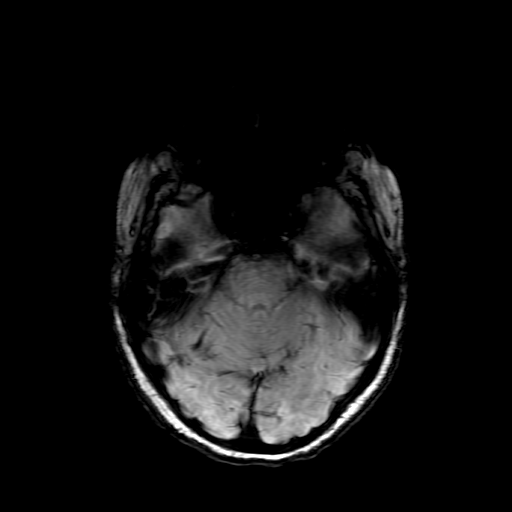
[im 69/104]
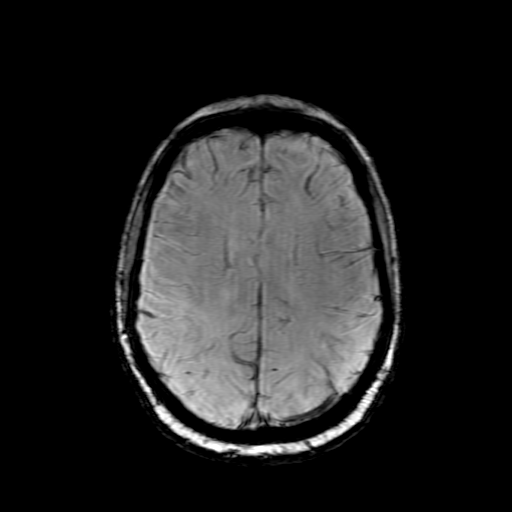
[im 104/104]
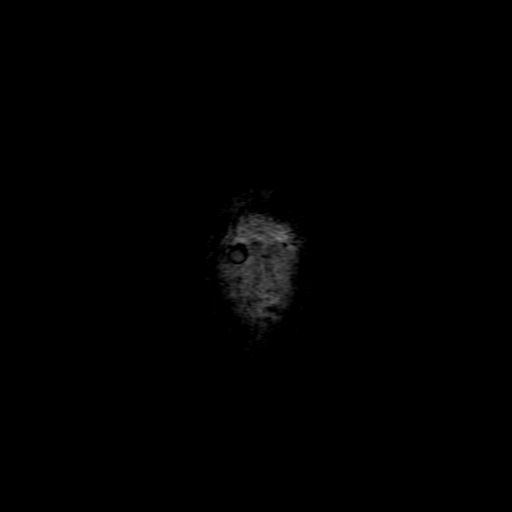

[Series 12: ax 3(person_name) pre · axial · non-contrast · 3.0mm · 0.94mm/px · z∈[-151,-4]mm · 2 of 52 slices shown]
[im 1/52]
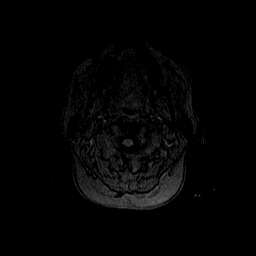
[im 52/52]
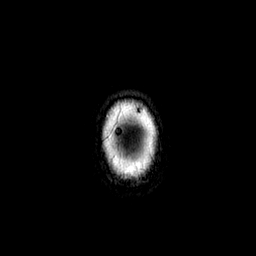

[Series 13: T2 · coronal · 5.0mm · 0.39mm/px · 1 of 29 slices shown (2 of 2)]
[im 1/29]
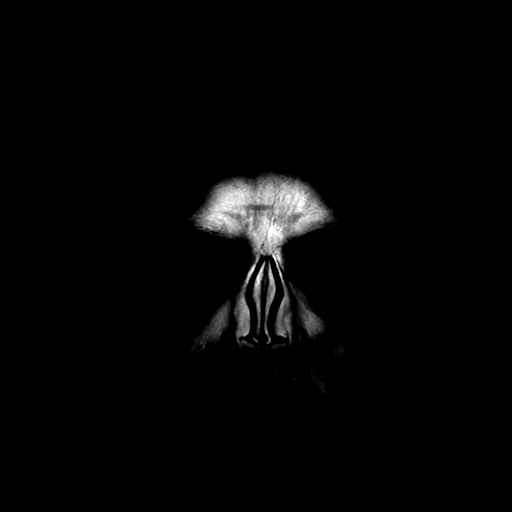

[Series 52: nqsegcb_sc_cor · 1.00mm/px · 8 of 233 slices shown]
[im 1/233]
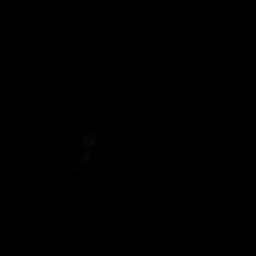
[im 34/233]
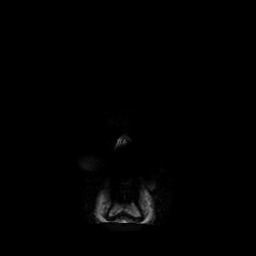
[im 67/233]
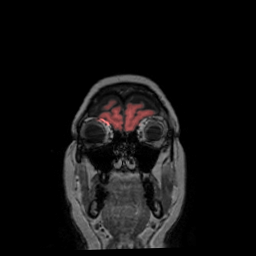
[im 100/233]
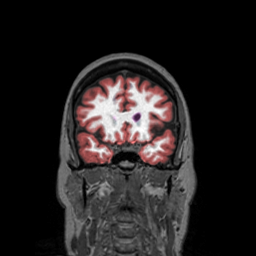
[im 133/233]
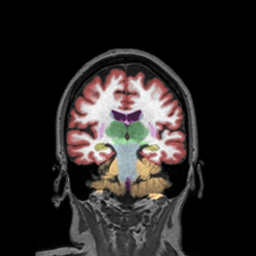
[im 166/233]
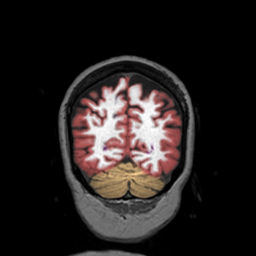
[im 199/233]
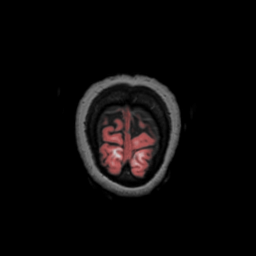
[im 233/233]
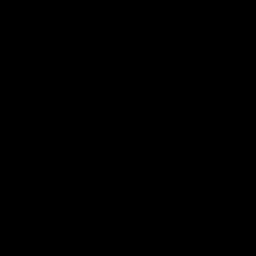

[Series 53: nqsegcb_sc_axl · 1.00mm/px · 7 of 220 slices shown]
[im 1/220]
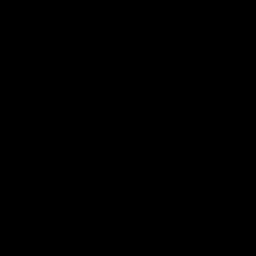
[im 37/220]
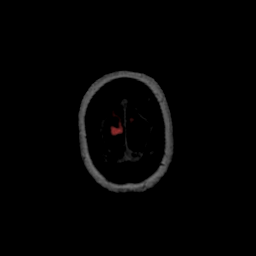
[im 74/220]
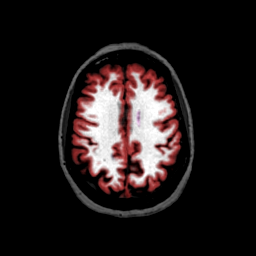
[im 110/220]
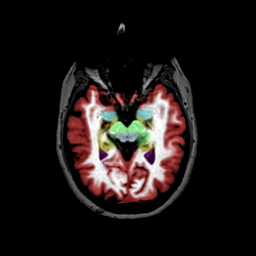
[im 147/220]
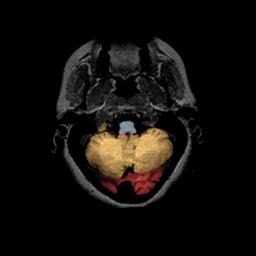
[im 183/220]
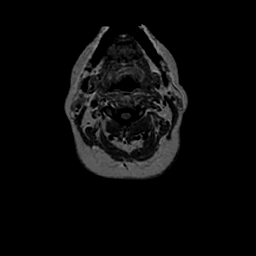
[im 220/220]
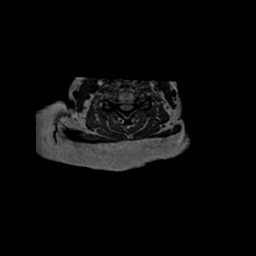

[Series 54: nqsegcb_sc_sag · 1.00mm/px · 6 of 191 slices shown]
[im 1/191]
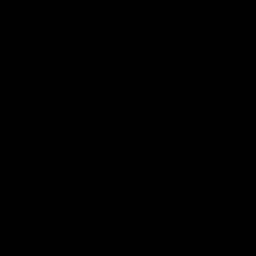
[im 39/191]
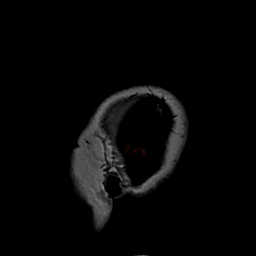
[im 77/191]
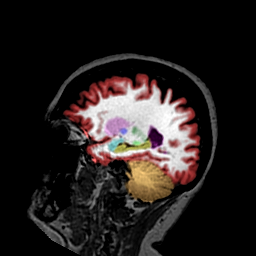
[im 115/191]
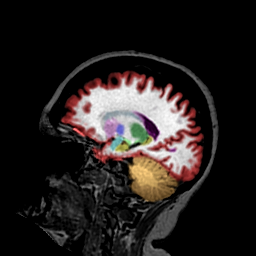
[im 153/191]
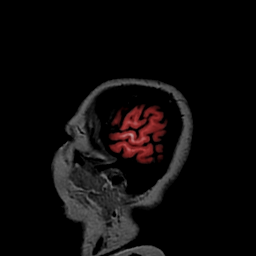
[im 191/191]
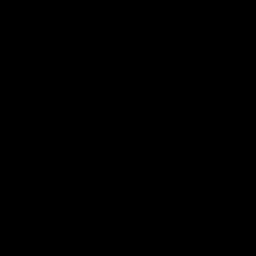

[Series 650: ADC · coronal · 4.0mm · 0.94mm/px · 1 of 35 slices shown (1 of 2)]
[im 1/35]
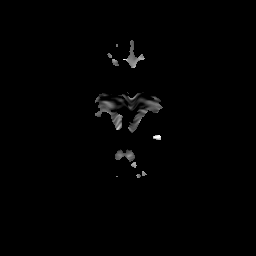

[Series 750: ADC · axial · 3.0mm · 0.94mm/px · z∈[-151,-5]mm · 2 of 52 slices shown (2 of 2)]
[im 1/52]
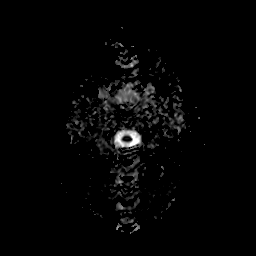
[im 52/52]
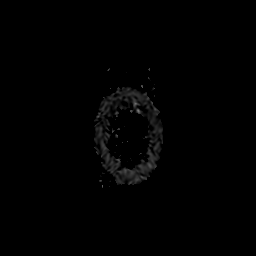

[45 of 48 positions shown; findings below may reference images not displayed]

FINDINGS: Brain: No infarction, hemorrhage, hydrocephalus, extra-axial
collection or mass lesion. No focal or advanced cortical atrophy. No
significant white matter disease. No chronic blood products.

Vascular: Normal flow voids

Skull and upper cervical spine: Normal marrow signal

Sinuses/Orbits: Negative

NeuroQuant Findings:

Volumetric analysis of the brain was performed, with a fully
detailed report in [HOSPITAL] PACS. Tagging is fairly typical quality
with areas of cortical over tagging in the inferior frontal lobes
and superior sagittal sinus, and under tagging at the occipital
cortex. Compared to subjective assessment the 6th percentile
normative whole brain value is lower than expected.
IMPRESSION: 1. No reversible or specific finding.
2. NeuroQuant volumetric analysis of the brain, see details on
[HOSPITAL] PACS.

## 2019-11-29 DIAGNOSIS — F209 Schizophrenia, unspecified: Secondary | ICD-10-CM | POA: Diagnosis not present

## 2019-11-29 DIAGNOSIS — F324 Major depressive disorder, single episode, in partial remission: Secondary | ICD-10-CM | POA: Diagnosis not present

## 2019-12-09 DIAGNOSIS — F200 Paranoid schizophrenia: Secondary | ICD-10-CM | POA: Diagnosis not present

## 2019-12-09 DIAGNOSIS — R413 Other amnesia: Secondary | ICD-10-CM | POA: Diagnosis not present

## 2019-12-13 DIAGNOSIS — H2513 Age-related nuclear cataract, bilateral: Secondary | ICD-10-CM | POA: Diagnosis not present

## 2019-12-16 DIAGNOSIS — F209 Schizophrenia, unspecified: Secondary | ICD-10-CM | POA: Diagnosis not present

## 2019-12-16 DIAGNOSIS — F324 Major depressive disorder, single episode, in partial remission: Secondary | ICD-10-CM | POA: Diagnosis not present

## 2019-12-17 ENCOUNTER — Encounter: Payer: Self-pay | Admitting: Physician Assistant

## 2019-12-25 ENCOUNTER — Telehealth: Payer: Self-pay

## 2019-12-25 NOTE — Telephone Encounter (Signed)
Patient called in stating that she missed a phone call. Informed patient I would send a message to our practice administrator to return her phone call. Patient requested a message be sent via MyChart rather than a phone call unable to reach patient. Could you please advise?

## 2019-12-25 NOTE — Telephone Encounter (Signed)
Informed pt that after DJE reviewed her records he feels he is able to help her.   Pt is disappointed and would like Dr .Amalia Hailey to reconsider. I advised pt that if he did not reconsider how would she like to proceed. She states she would like to be referred out to someone that can remove bats. She states she is willing to pay for this service. She can travel if needed.   Will send message Amalia Hailey to see about a referral.

## 2019-12-25 NOTE — Telephone Encounter (Signed)
LMTRC

## 2019-12-26 ENCOUNTER — Encounter: Payer: Medicare Other | Admitting: Obstetrics and Gynecology

## 2019-12-27 NOTE — Telephone Encounter (Signed)
Where do you suggest I refer her to? ty  CM

## 2019-12-28 NOTE — Telephone Encounter (Signed)
You don't need to refer her to anywhere.  She has already researched the other providers in the area, and if she chooses, she is free to set up an appointment with any of them.

## 2020-01-01 DIAGNOSIS — F209 Schizophrenia, unspecified: Secondary | ICD-10-CM | POA: Diagnosis not present

## 2020-01-01 DIAGNOSIS — F324 Major depressive disorder, single episode, in partial remission: Secondary | ICD-10-CM | POA: Diagnosis not present

## 2020-01-02 NOTE — Telephone Encounter (Signed)
LM informing pt.

## 2020-01-09 ENCOUNTER — Ambulatory Visit (INDEPENDENT_AMBULATORY_CARE_PROVIDER_SITE_OTHER): Payer: Medicare Other

## 2020-01-09 ENCOUNTER — Other Ambulatory Visit: Payer: Self-pay

## 2020-01-09 DIAGNOSIS — Z23 Encounter for immunization: Secondary | ICD-10-CM | POA: Diagnosis not present

## 2020-01-10 ENCOUNTER — Ambulatory Visit: Payer: Medicare Other | Admitting: Physician Assistant

## 2020-01-13 DIAGNOSIS — E890 Postprocedural hypothyroidism: Secondary | ICD-10-CM | POA: Diagnosis not present

## 2020-01-15 ENCOUNTER — Ambulatory Visit: Payer: Medicare Other

## 2020-01-20 DIAGNOSIS — Z85850 Personal history of malignant neoplasm of thyroid: Secondary | ICD-10-CM | POA: Diagnosis not present

## 2020-01-20 DIAGNOSIS — E890 Postprocedural hypothyroidism: Secondary | ICD-10-CM | POA: Diagnosis not present

## 2020-01-22 DIAGNOSIS — F209 Schizophrenia, unspecified: Secondary | ICD-10-CM | POA: Diagnosis not present

## 2020-01-22 DIAGNOSIS — F324 Major depressive disorder, single episode, in partial remission: Secondary | ICD-10-CM | POA: Diagnosis not present

## 2020-01-29 DIAGNOSIS — R413 Other amnesia: Secondary | ICD-10-CM | POA: Diagnosis not present

## 2020-01-29 DIAGNOSIS — F200 Paranoid schizophrenia: Secondary | ICD-10-CM | POA: Diagnosis not present

## 2020-02-06 DIAGNOSIS — F209 Schizophrenia, unspecified: Secondary | ICD-10-CM | POA: Diagnosis not present

## 2020-02-06 DIAGNOSIS — F324 Major depressive disorder, single episode, in partial remission: Secondary | ICD-10-CM | POA: Diagnosis not present

## 2020-02-11 DIAGNOSIS — R413 Other amnesia: Secondary | ICD-10-CM | POA: Diagnosis not present

## 2020-02-24 DIAGNOSIS — F324 Major depressive disorder, single episode, in partial remission: Secondary | ICD-10-CM | POA: Diagnosis not present

## 2020-02-24 DIAGNOSIS — F209 Schizophrenia, unspecified: Secondary | ICD-10-CM | POA: Diagnosis not present

## 2020-03-16 DIAGNOSIS — E890 Postprocedural hypothyroidism: Secondary | ICD-10-CM | POA: Diagnosis not present

## 2020-03-16 DIAGNOSIS — Z85850 Personal history of malignant neoplasm of thyroid: Secondary | ICD-10-CM | POA: Diagnosis not present

## 2020-03-20 ENCOUNTER — Encounter: Payer: Self-pay | Admitting: Physician Assistant

## 2020-03-23 DIAGNOSIS — Z85850 Personal history of malignant neoplasm of thyroid: Secondary | ICD-10-CM | POA: Diagnosis not present

## 2020-03-23 DIAGNOSIS — E890 Postprocedural hypothyroidism: Secondary | ICD-10-CM | POA: Diagnosis not present

## 2020-03-31 ENCOUNTER — Ambulatory Visit: Payer: Medicare Other | Attending: Neurology | Admitting: Speech Pathology

## 2020-03-31 ENCOUNTER — Other Ambulatory Visit: Payer: Self-pay

## 2020-03-31 DIAGNOSIS — R41841 Cognitive communication deficit: Secondary | ICD-10-CM | POA: Insufficient documentation

## 2020-04-01 ENCOUNTER — Encounter: Payer: Self-pay | Admitting: Speech Pathology
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
## 2020-04-01 NOTE — Addendum Note (Signed)
Addended by: Reuel Derby B on: 04/01/2020 09:28 AM   Modules accepted: Orders

## 2020-04-16 ENCOUNTER — Ambulatory Visit (INDEPENDENT_AMBULATORY_CARE_PROVIDER_SITE_OTHER): Payer: Medicare Other | Admitting: Obstetrics and Gynecology

## 2020-04-16 ENCOUNTER — Encounter: Payer: Self-pay | Admitting: Physician Assistant

## 2020-04-16 ENCOUNTER — Encounter: Payer: Self-pay | Admitting: Obstetrics and Gynecology

## 2020-04-16 ENCOUNTER — Other Ambulatory Visit: Payer: Self-pay

## 2020-04-16 VITALS — BP 123/83 | HR 81 | Ht 60.0 in | Wt 126.1 lb

## 2020-04-16 DIAGNOSIS — G8929 Other chronic pain: Secondary | ICD-10-CM

## 2020-04-16 DIAGNOSIS — M545 Low back pain, unspecified: Secondary | ICD-10-CM | POA: Diagnosis not present

## 2020-04-16 DIAGNOSIS — E785 Hyperlipidemia, unspecified: Secondary | ICD-10-CM

## 2020-04-16 DIAGNOSIS — F312 Bipolar disorder, current episode manic severe with psychotic features: Secondary | ICD-10-CM

## 2020-04-16 DIAGNOSIS — Z5320 Procedure and treatment not carried out because of patient's decision for unspecified reasons: Secondary | ICD-10-CM | POA: Diagnosis not present

## 2020-04-16 DIAGNOSIS — Z01419 Encounter for gynecological examination (general) (routine) without abnormal findings: Secondary | ICD-10-CM | POA: Diagnosis not present

## 2020-04-16 DIAGNOSIS — F458 Other somatoform disorders: Secondary | ICD-10-CM

## 2020-04-16 DIAGNOSIS — E890 Postprocedural hypothyroidism: Secondary | ICD-10-CM | POA: Diagnosis not present

## 2020-04-16 NOTE — Patient Instructions (Signed)
Preventive Care 84-58 Years Old, Female Preventive care refers to lifestyle choices and visits with your health care provider that can promote health and wellness. This includes:  A yearly physical exam. This is also called an annual wellness visit.  Regular dental and eye exams.  Immunizations.  Screening for certain conditions.  Healthy lifestyle choices, such as: ? Eating a healthy diet. ? Getting regular exercise. ? Not using drugs or products that contain nicotine and tobacco. ? Limiting alcohol use. What can I expect for my preventive care visit? Physical exam Your health care provider will check your:  Height and weight. These may be used to calculate your BMI (body mass index). BMI is a measurement that tells if you are at a healthy weight.  Heart rate and blood pressure.  Body temperature.  Skin for abnormal spots. Counseling Your health care provider may ask you questions about your:  Past medical problems.  Family's medical history.  Alcohol, tobacco, and drug use.  Emotional well-being.  Home life and relationship well-being.  Sexual activity.  Diet, exercise, and sleep habits.  Work and work Statistician.  Access to firearms.  Method of birth control.  Menstrual cycle.  Pregnancy history. What immunizations do I need? Vaccines are usually given at various ages, according to a schedule. Your health care provider will recommend vaccines for you based on your age, medical history, and lifestyle or other factors, such as travel or where you work.   What tests do I need? Blood tests  Lipid and cholesterol levels. These may be checked every 5 years, or more often if you are over 3 years old.  Hepatitis C test.  Hepatitis B test. Screening  Lung cancer screening. You may have this screening every year starting at age 73 if you have a 30-pack-year history of smoking and currently smoke or have quit within the past 15 years.  Colorectal cancer  screening. ? All adults should have this screening starting at age 52 and continuing until age 17. ? Your health care provider may recommend screening at age 49 if you are at increased risk. ? You will have tests every 1-10 years, depending on your results and the type of screening test.  Diabetes screening. ? This is done by checking your blood sugar (glucose) after you have not eaten for a while (fasting). ? You may have this done every 1-3 years.  Mammogram. ? This may be done every 1-2 years. ? Talk with your health care provider about when you should start having regular mammograms. This may depend on whether you have a family history of breast cancer.  BRCA-related cancer screening. This may be done if you have a family history of breast, ovarian, tubal, or peritoneal cancers.  Pelvic exam and Pap test. ? This may be done every 3 years starting at age 10. ? Starting at age 11, this may be done every 5 years if you have a Pap test in combination with an HPV test. Other tests  STD (sexually transmitted disease) testing, if you are at risk.  Bone density scan. This is done to screen for osteoporosis. You may have this scan if you are at high risk for osteoporosis. Talk with your health care provider about your test results, treatment options, and if necessary, the need for more tests. Follow these instructions at home: Eating and drinking  Eat a diet that includes fresh fruits and vegetables, whole grains, lean protein, and low-fat dairy products.  Take vitamin and mineral supplements  as recommended by your health care provider.  Do not drink alcohol if: ? Your health care provider tells you not to drink. ? You are pregnant, may be pregnant, or are planning to become pregnant.  If you drink alcohol: ? Limit how much you have to 0-1 drink a day. ? Be aware of how much alcohol is in your drink. In the [place] one drink equals one 12 oz bottle of beer (355 mL), one 5 oz glass of  wine (148 mL), or one 1 oz glass of hard liquor (44 mL).   Lifestyle  Take daily care of your teeth and gums. Brush your teeth every morning and night with fluoride toothpaste. Floss one time each day.  Stay active. Exercise for at least 30 minutes 5 or more days each week.  Do not use any products that contain nicotine or tobacco, such as cigarettes, e-cigarettes, and chewing tobacco. If you need help quitting, ask your health care provider.  Do not use drugs.  If you are sexually active, practice safe sex. Use a condom or other form of protection to prevent STIs (sexually transmitted infections).  If you do not wish to become pregnant, use a form of birth control. If you plan to become pregnant, see your health care provider for a prepregnancy visit.  If told by your health care provider, take low-dose aspirin daily starting at age 50.  Find healthy ways to cope with stress, such as: ? Meditation, yoga, or listening to music. ? Journaling. ? Talking to a trusted person. ? Spending time with friends and family. Safety  Always wear your seat belt while driving or riding in a vehicle.  Do not drive: ? If you have been drinking alcohol. Do not ride with someone who has been drinking. ? When you are tired or distracted. ? While texting.  Wear a helmet and other protective equipment during sports activities.  If you have firearms in your house, make sure you follow all gun safety procedures. What's next?  Visit your health care provider once a year for an annual wellness visit.  Ask your health care provider how often you should have your eyes and teeth checked.  Stay up to date on all vaccines. This information is not intended to replace advice given to you by your health care provider. Make sure you discuss any questions you have with your health care provider. Document Revised: 12/17/2019 Document Reviewed: 11/23/2017 Elsevier Patient Education  2021 Elsevier Inc.  

## 2020-04-16 NOTE — Progress Notes (Signed)
Pt present for annual exam. Pt c/o  Mid/lower back pain that is extreme at night and had a brain ct and was dx with low brain value.

## 2020-04-16 NOTE — Progress Notes (Signed)
ANNUAL PREVENTATIVE CARE GYNECOLOGY  ENCOUNTER NOTE  Subjective:       Diamond Lucas is a 60 y.o. G0P0000 female here for a routine annual gynecologic exam. She has a history of delusional disorder, bipolar I disorder, and hypothyroidism. The patient is not sexually active. She is not taking hormone replacement therapy. Patient denies post-menopausal vaginal bleeding. The patient wears seatbelts: yes. The patient participates in regular exercise: no.  Current complaints: 1.  Diamond Lucas notes concerns that she still has not delivered her bat fetuses. Notes that they were due for delivery last year around Christmas.  "Jesus" is especially concerned.  The remaining fetuses are Diamond Lucas, Diamond Lucas, Diamond Lucas, and Diamond Lucas, who are also eagerly awaiting.  2. She is noting more back pain at night, and has been noting more of a bulge in her upper abdomen. Is concerned that something may be pushing on her belly (possibly one of the bat fetuses), and could be causing her the back pain.  She has been experiencing back pain for the past year, however has slowly been worsening.     Gynecologic History Patient's last menstrual period was 04/10/2015. Contraception: post menopausal status Last Pap: 04/09/2019 Results were: normal per pt Last mammogram: Declines.  Notes that she does not desire to get a mammogram as she is fearful of the radiation. Has been offered an ultrasound in the past but forgets to schedule.  Last Colonoscopy: 02/08/2018. Several benign (hyperplastic) polyps noted. Can repeat in 10 years.  Last Dexa Scan: Never had one.    Obstetric History OB History  Gravida Para Term Preterm AB Living  0 0 0 0 0 0  SAB IAB Ectopic Multiple Live Births  0 0 0 0      Past Medical History:  Diagnosis Date  . Allergy   . Arrhythmia   . History of chicken pox   . History of measles   . History of syncope   . Hypertension   . Osteoarthritis   . Palpitations   . Pure hypercholesterolemia   .  Rosacea   . Schizophrenia (HCC)   . Thyroid disease     Family History  Problem Relation Age of Onset  . Depression Mother   . Cataracts Mother   . Stroke Mother        mini  . Cancer Other   . Heart failure Other   . Heart disease Other   . Stroke Other   . Diabetes Other   . Kidney cancer Father   . Anxiety disorder Brother     Past Surgical History:  Procedure Laterality Date  . COLONOSCOPY WITH PROPOFOL N/A 02/08/2018   Procedure: COLONOSCOPY WITH PROPOFOL;  Surgeon: Pasty Spillersahiliani, Varnita B, MD;  Location: [facility] ENDOSCOPY;  Service: Endoscopy;  Laterality: N/A;  . FOOT SURGERY Right   . THYROIDECTOMY      Social History   Socioeconomic History  . Marital status: Single    Spouse name: Not on file  . Number of children: 0  . Years of education: Not on file  . Highest education level: Bachelor's degree (e.g., BA, AB, BS)  Occupational History  . Occupation: retired  Tobacco Use  . Smoking status: Never Smoker  . Smokeless tobacco: Never Used  Vaping Use  . Vaping Use: Never used  Substance and Sexual Activity  . Alcohol use: No  . Drug use: No  . Sexual activity: Not Currently    Birth control/protection: Post-menopausal, Abstinence  Other Topics Concern  . Not  on file  Social History Narrative  . Not on file   Social Determinants of Health   Financial Resource Strain: Medium Risk  . Difficulty of Paying Living Expenses: Somewhat hard  Food Insecurity: No Food Insecurity  . Worried About Charity fundraiser in the Last Year: Never true  . Ran Out of Food in the Last Year: Never true  Transportation Needs: No Transportation Needs  . Lack of Transportation (Medical): No  . Lack of Transportation (Non-Medical): No  Physical Activity: Insufficiently Active  . Days of Exercise per Week: 2 days  . Minutes of Exercise per Session: 20 min  Stress: Stress Concern Present  . Feeling of Stress : Very much  Social Connections: Moderately Isolated  . Frequency of  Communication with Friends and Family: Twice a week  . Frequency of Social Gatherings with Friends and Family: More than three times a week  . Attends Religious Services: More than 4 times per year  . Active Member of Clubs or Organizations: No  . Attends Archivist Meetings: Never  . Marital Status: Never married  Intimate Partner Violence: Not At Risk  . Fear of Current or Ex-Partner: No  . Emotionally Abused: No  . Physically Abused: No  . Sexually Abused: No    Current Outpatient Medications on File Prior to Visit  Medication Sig Dispense Refill  . Calcium Carbonate-Vit D-Min (CALCIUM 600+D PLUS MINERALS) 600-400 MG-UNIT TABS Take by mouth daily.     . Carboxymeth-Glyc-Polysorb PF 0.5-1-0.5 % SOLN Apply 2 drops to eye 2 (two) times daily.    . cyclobenzaprine (FLEXERIL) 5 MG tablet Take 5 mg by mouth at bedtime as needed. Occasionally takes during the day    . Ibuprofen (MOTRIN PO) Take by mouth as needed (liquid).    . Levothyroxine Sodium (TIROSINT) 50 MCG CAPS Take by mouth daily before breakfast.    . Multiple Vitamin (MULTI-VITAMINS) TABS daily.     . Omega-3 Fatty Acids (FISH OIL) 1000 MG CAPS Take by mouth. Takes occasionally due to causing acid reflux    . risperiDONE (RISPERDAL) 1 MG tablet Take 1 mg by mouth at bedtime.    . triamcinolone (NASACORT) 55 MCG/ACT AERO nasal inhaler Place 2 sprays into the nose as needed. Once daily as needed.    . Eflornithine HCl 13.9 % cream Use twice daily 8 hours apart (Patient not taking: Reported on 04/16/2020) 45 g 1   No current facility-administered medications on file prior to visit.    Allergies  Allergen Reactions  . Diclofenac Sodium Palpitations  . Naproxen Itching, Rash and Hives  . Olanzapine     Other reaction(s): Other Cross-eyed and hyperventilation  . Gluten Meal Other (See Comments)    Abdominal bloating  . Mite (D. Farinae) Itching    Respiratory illness, and itchy eyes, eye problems Respiratory  illness, and itchy eyes, eye problems  . Nsaids Other (See Comments)    Other reaction(s): Other (See Comments) Aleve causes hives 10/15 pt states she occ. Takes Motrin. Other reaction(s): Other (See Comments) Aleve causes hives  . Other Itching    Respiratory illness, and itchy eyes, eye problems Respiratory illness, and itchy eyes, eye problems  . Synthroid [Levothyroxine]     Brand name only- causes acid reflux     Review of Systems ROS Review of Systems - General ROS: negative for - chills, fatigue, fever, hot flashes, night sweats, weight gain or weight loss Psychological ROS: negative for - anxiety, decreased libido,  depression, mood swings, physical abuse or sexual abuse Ophthalmic ROS: negative for - blurry vision, eye pain or loss of vision ENT ROS: negative for - headaches, hearing change, visual changes or vocal changes Allergy and Immunology ROS: negative for - hives, itchy/watery eyes or seasonal allergies Hematological and Lymphatic ROS: negative for - bleeding problems, bruising, swollen lymph nodes or weight loss Endocrine ROS: negative for - galactorrhea, hair pattern changes, hot flashes, malaise/lethargy, mood swings, palpitations, polydipsia/polyuria, skin changes, temperature intolerance or unexpected weight changes Breast ROS: negative for - new or changing breast lumps or nipple discharge Respiratory ROS: negative for - cough or shortness of breath Cardiovascular ROS: negative for - chest pain, irregular heartbeat, palpitations or shortness of breath Gastrointestinal ROS: no abdominal pain, change in bowel habits, or black or bloody stools. Abdominal bulge (see HPI) Genito-Urinary ROS: no dysuria, trouble voiding, or hematuria Musculoskeletal ROS: negative for - joint stiffness.  Positive for back pain (see HPI) Neurological ROS: negative for - bowel and bladder control changes Dermatological ROS: negative for rash and skin lesion changes   Objective:   BP  123/83   Pulse 81   Ht 5' (1.524 m)   Wt 126 lb 1.6 oz (57.2 kg)   LMP 04/10/2015 Comment: Only spotting  BMI 24.63 kg/m  CONSTITUTIONAL: Well-developed, well-nourished female in no acute distress.  PSYCHIATRIC: Normal mood and affect. Normal behavior. Normal judgment and thought content. [place]: Alert and oriented to person, place, and time. Normal muscle tone coordination. No cranial nerve deficit noted. HENT:  Normocephalic, atraumatic, External right and left ear normal. Oropharynx is clear and moist EYES: Conjunctivae and EOM are normal. Pupils are equal, round, and reactive to light. No scleral icterus.  NECK: Normal range of motion, supple, no masses.  Normal thyroid.  SKIN: Skin is warm and dry. No rash noted. Not diaphoretic. No erythema. No pallor. CARDIOVASCULAR: Normal heart rate noted, regular rhythm, no murmur. RESPIRATORY: Clear to auscultation bilaterally. Effort and breath sounds normal, no problems with respiration noted. BREASTS: Symmetric in size. No masses, skin changes, nipple drainage, or lymphadenopathy. ABDOMEN: Soft, normal bowel sounds, no distention noted.  No tenderness, rebound or guarding.  BLADDER: Normal PELVIC:  Bladder no bladder distension noted  Urethra: normal appearing urethra with no masses, tenderness or lesions  Vulva: normal appearing vulva with no masses, tenderness or lesions  Vagina: mildly atrophic vagina with no discharge, no lesions  Cervix: normal appearing cervix without discharge or lesions  Uterus: uterus is normal size, shape, consistency and nontender  Adnexa: normal adnexa in size, nontender and no masses  RV: External Exam NormaI, No Rectal Masses and Normal Sphincter tone  MUSCULOSKELETAL: Normal range of motion. No tenderness.  No cyanosis, clubbing, or edema.  2+ distal pulses. LYMPHATIC: No Axillary, Supraclavicular, or Inguinal Adenopathy.   Labs: Lab Results  Component Value Date   WBC 8.1 10/31/2017   HGB 14.8  10/31/2017   HCT 42.8 10/31/2017   MCV 91.8 10/31/2017   PLT 236 10/31/2017    Lab Results  Component Value Date   CREATININE 0.96 10/08/2019   BUN 22 10/08/2019   NA 139 10/08/2019   K 4.7 10/08/2019   CL 100 10/08/2019   CO2 27 10/08/2019    Lab Results  Component Value Date   ALT 28 10/08/2019   AST 22 10/08/2019   ALKPHOS 100 10/08/2019   BILITOT 0.2 10/08/2019    Lab Results  Component Value Date   CHOL 215 (H) 10/08/2019   HDL  48 10/08/2019   LDLCALC 133 (H) 10/08/2019   TRIG 192 (H) 10/08/2019   CHOLHDL 3.4 10/31/2017    Lab Results  Component Value Date   TSH 0.063 (L) 10/31/2017    Lab Results  Component Value Date   HGBA1C 5.1 10/31/2017     Assessment:   Encounter for well woman exam with routine gynecological exam  Bipolar I disorder, most recent episode manic, severe with psychotic features Postoperative hypothyroidism Delusion of pregnancy Dyslipidemia Breast screening declined Back pain Abdominal bulge  Plan:  - Pap: Not needed.  Up to date.  - Mammogram: Patient declines mammogram due to fear of radiation. Discussed importance of breast cancer screening. Discussed alternative methods of screening, including possibly performing breast ultrasound (although less sensitive).  Patient did not schedule last year. Strongly encouraged to consider.  - Stool Guaiac Testing:  Not needed.  Colonoscopy up to date.  - Labs: Paitent notes having her thyroid levels checked recently, reviewed in Care Everywhere. Will get other labs drawn by PCP. - Routine preventative health maintenance measures emphasized: Exercise/Diet/Weight control, Tobacco Warnings, Alcohol/Substance use risks, Stress Management, Peer Pressure Issues and Safe Sex - Encouraged use of Vitamin D and Calcium for bone support.  - Patient continues with delusions of pregnancy.  Is seen by Psychiatry, notes taking her medications but dosage of Risperdal was decreased due to side effects  (galactorrhea). - Dyslipidemia mild, declines repeat lab at this time.  - Back pain from unclear source. Denies heavy lifting or recent strains. Discussed option of back brace.   - Abdominal bulge noted by patient, not seen on today's exam. Desires imaging to make sure it is not a fetus trapped.  Bedside sono performed today to patient's satisfaction.  Return to [place], MD  [facility]

## 2020-04-17 ENCOUNTER — Other Ambulatory Visit: Payer: Self-pay

## 2020-04-17 DIAGNOSIS — F324 Major depressive disorder, single episode, in partial remission: Secondary | ICD-10-CM | POA: Diagnosis not present

## 2020-04-17 DIAGNOSIS — F209 Schizophrenia, unspecified: Secondary | ICD-10-CM | POA: Diagnosis not present

## 2020-04-18 DIAGNOSIS — F200 Paranoid schizophrenia: Secondary | ICD-10-CM | POA: Diagnosis not present

## 2020-05-01 DIAGNOSIS — F324 Major depressive disorder, single episode, in partial remission: Secondary | ICD-10-CM | POA: Diagnosis not present

## 2020-05-01 DIAGNOSIS — F209 Schizophrenia, unspecified: Secondary | ICD-10-CM | POA: Diagnosis not present

## 2020-05-06 ENCOUNTER — Encounter: Payer: Self-pay | Admitting: Physician Assistant

## 2020-05-19 DIAGNOSIS — F324 Major depressive disorder, single episode, in partial remission: Secondary | ICD-10-CM | POA: Diagnosis not present

## 2020-05-19 DIAGNOSIS — F209 Schizophrenia, unspecified: Secondary | ICD-10-CM | POA: Diagnosis not present

## 2020-05-20 ENCOUNTER — Telehealth: Payer: Self-pay | Admitting: Obstetrics and Gynecology

## 2020-05-20 NOTE — Telephone Encounter (Signed)
Patient called in wanted me to let you know that she is having computer difficulties this morning was was unable to get your reply- I did read you response to her via mychart she asked that I reply to you " Thank you I appreciate your help".

## 2020-05-30 ENCOUNTER — Encounter: Payer: Self-pay | Admitting: Physician Assistant

## 2020-06-03 MED ORDER — PERMETHRIN 5 % EX CREA: 1.0000 "application " | TOPICAL_CREAM | Freq: Once | CUTANEOUS | 0 refills | Status: AC

## 2020-06-09 DIAGNOSIS — F324 Major depressive disorder, single episode, in partial remission: Secondary | ICD-10-CM | POA: Diagnosis not present

## 2020-06-09 DIAGNOSIS — F209 Schizophrenia, unspecified: Secondary | ICD-10-CM | POA: Diagnosis not present

## 2020-07-02 DIAGNOSIS — L600 Ingrowing nail: Secondary | ICD-10-CM | POA: Diagnosis not present

## 2020-07-08 DIAGNOSIS — F209 Schizophrenia, unspecified: Secondary | ICD-10-CM | POA: Diagnosis not present

## 2020-07-08 DIAGNOSIS — F324 Major depressive disorder, single episode, in partial remission: Secondary | ICD-10-CM | POA: Diagnosis not present

## 2020-07-09 ENCOUNTER — Encounter: Payer: Self-pay | Admitting: Physician Assistant

## 2020-07-10 MED ORDER — CYCLOBENZAPRINE HCL 5 MG PO TABS: 5.0000 mg | ORAL_TABLET | Freq: Every evening | ORAL | 0 refills | Status: DC | PRN

## 2020-08-07 DIAGNOSIS — F209 Schizophrenia, unspecified: Secondary | ICD-10-CM | POA: Diagnosis not present

## 2020-08-07 DIAGNOSIS — F324 Major depressive disorder, single episode, in partial remission: Secondary | ICD-10-CM | POA: Diagnosis not present

## 2020-08-24 ENCOUNTER — Encounter: Payer: Self-pay | Admitting: Physician Assistant

## 2020-08-25 ENCOUNTER — Telehealth: Payer: Self-pay | Admitting: Physician Assistant

## 2020-09-01 DIAGNOSIS — F209 Schizophrenia, unspecified: Secondary | ICD-10-CM | POA: Diagnosis not present

## 2020-09-01 DIAGNOSIS — F324 Major depressive disorder, single episode, in partial remission: Secondary | ICD-10-CM | POA: Diagnosis not present

## 2020-09-07 ENCOUNTER — Other Ambulatory Visit: Payer: Self-pay

## 2020-09-07 ENCOUNTER — Encounter: Payer: Self-pay | Admitting: Family Medicine

## 2020-09-07 ENCOUNTER — Ambulatory Visit (INDEPENDENT_AMBULATORY_CARE_PROVIDER_SITE_OTHER): Payer: Medicare Other | Admitting: Family Medicine

## 2020-09-07 VITALS — BP 115/72 | HR 67 | Temp 98.1°F | Resp 15 | Wt 128.4 lb

## 2020-09-07 DIAGNOSIS — Z7185 Encounter for immunization safety counseling: Secondary | ICD-10-CM

## 2020-09-07 DIAGNOSIS — Z23 Encounter for immunization: Secondary | ICD-10-CM | POA: Diagnosis not present

## 2020-09-07 NOTE — Patient Instructions (Signed)
Hepatitis A Vaccine: What You Need to Know 1. Why get vaccinated? Hepatitis A vaccinecan prevent hepatitis A. Hepatitis A is a serious liver disease. It is usually spread through close, personal contact with an infected person or when a person unknowingly ingests the virus from objects, food, or drinks that are contaminated by small amounts of stool(poop) from an infected person. Most adults with hepatitis A have symptoms, including fatigue, low appetite, stomach pain, nausea, and jaundice (yellow skin or eyes, dark urine, light-colored bowel movements). Most children less than 10 years of age do nothave symptoms. A person infected with hepatitis A can transmit the disease to other peopleeven if he or she does not have any symptoms of the disease.  Most people who get hepatitis A feel sick for several weeks, but they usually recover completely and do not have lasting liver damage. In rare cases, hepatitis A can cause liver failure and death; this is more common in peopleolder than 50 years and in people with other liver diseases. Hepatitis A vaccine has made this disease much less common in the [place]. However, outbreaks of hepatitis A among unvaccinated people stillhappen. 2. Hepatitis A vaccine Children need 2 doses of hepatitis A vaccine: First dose: 12 through 65 months of age Second dose: at least 6 months after the first dose Infants 1 through 72 months old traveling outside the [place] when protection against hepatitis A is recommended should receive 1 dose of hepatitis A vaccine. These children should still get 2 additional doses at the recommended ages for long-lastingprotection. Older children and adolescents 2 through 36 years of age who were not vaccinated previously should bevaccinated. Adults who were not vaccinated previously and want to be protected against hepatitis Acan also get the vaccine. Hepatitis A vaccine is also recommended for the following people: International  travelers Men who have sexual contact with other men People who use injection or non-injection drugs People who have occupational risk for infection People who anticipate close contact with an international adoptee People experiencing homelessness People with HIV People with chronic liver disease In addition, a person who has not previously received hepatitis A vaccine and who has direct contact with someone with hepatitis A should get hepatitis Avaccine as soon as possible and within 2 weeks after exposure. Hepatitis A vaccine may be given at the same time as other vaccines. 3. Talk with your health care provider Tell your vaccination provider if the person getting the vaccine: Has had an allergic reaction after a previous dose of hepatitis A vaccine,or has any severe, life-threatening allergies In some cases, your health care provider may decide to postpone hepatitis Avaccination until a future visit. Pregnant or breastfeeding people should be vaccinated if they are at risk for getting hepatitis A. Pregnancy or breastfeeding are not reasons to avoidhepatitis A vaccination. People with minor illnesses, such as a cold, may be vaccinated. People who are moderately or severely ill should usually wait until they recover beforegetting hepatitis A vaccine. Your health care provider can give you more information. 4. Risks of a vaccine reaction Soreness or redness where the shot is given, fever, headache, tiredness, or loss of appetite can happen after hepatitis A vaccination. People sometimes faint after medical procedures, including vaccination. Tellyour provider if you feel dizzy or have vision changes or ringing in the ears. As with any medicine, there is a very remote chance of a vaccine causing asevere allergic reaction, other serious injury, or death. 5. What if there is a serious  problem? An allergic reaction could occur after the vaccinated person leaves the clinic. If you see signs of a  severe allergic reaction (hives, swelling of the face and throat, difficulty breathing, a fast heartbeat, dizziness, or weakness), call 9-1-1and get the person to the nearest hospital. For other signs that concern you, call your health care provider.  Adverse reactions should be reported to the Vaccine Adverse Event Reporting System (VAERS). Your health care provider will usually file this report, or you can do it yourself. Visit the VAERS website at www.vaers.SamedayNews.es or call 919-812-3144. VAERS is only for reporting reactions, and VAERS staff members do not give medical advice. 6. The National Vaccine Injury Compensation Program The [employer] Vaccine Injury Compensation Program (VICP) is a federal program that was created to compensate people who may have been injured by certain vaccines. Claims regarding alleged injury or death due to vaccination have a time limit for filing, which may be as short as two years. Visit the VICP website at GoldCloset.com.ee or call 217 708 6020 to learn about the program and about filing a claim. 7. How can I learn more? Ask your health care provider. Call your local or state health department. Visit the website of the Food and Drug Administration (FDA) for vaccine package inserts and additional information at TraderRating.uy. Contact the Centers for Disease Control and Prevention (CDC): Call 985-743-6774 (1-800-CDC-INFO) or Visit CDC's website at http://hunter.com/. Vaccine Information Statement Hepatitis A Vaccine (01/10/2020) This information is not intended to replace advice given to you by your health care provider. Make sure you discuss any questions you have with your healthcare provider. Document Revised: 01/31/2020 Document Reviewed: 01/31/2020 Elsevier Patient Education  2022 [employer].

## 2020-09-07 NOTE — Progress Notes (Signed)
      Established patient visit   Patient: Diamond Lucas   DOB: 02-02-1961   60 y.o. Female  MRN: [id number] Visit Date: 09/07/2020  Today's healthcare provider: Lelon Huh, MD   Chief Complaint  Patient presents with   Immunizations    Subjective    HPI  Patient comes into office today to update immunization(s), patient reports that they feel well today and have no complaints or concerns to address. Immunization record has been reviewed with patient, she states that she has read information online in regards to Hepatitis A vaccine and would like to discuss .  Immunization History  Administered Date(s) Administered   Fluad Quad(high Dose 65+) 01/09/2020   HPV Quadrivalent 10/02/2012   Influenza,inj,Quad PF,6+ Mos 08/20/2012, 01/17/2017, 01/22/2018, 12/04/2018   PFIZER(Purple Top)SARS-COV-2 Vaccination 06/20/2019, 07/11/2019   PPD Test 06/09/2014   Pneumococcal Polysaccharide-23 01/21/2019   Td 07/20/2017   Tetanus 12/26/2005   Varicella 12/03/2014        Medications: Outpatient Medications Prior to Visit  Medication Sig   Calcium Carbonate-Vit D-Min (CALCIUM 600+D PLUS MINERALS) 600-400 MG-UNIT TABS Take by mouth daily.    Carboxymeth-Glyc-Polysorb PF 0.5-1-0.5 % SOLN Apply 2 drops to eye 2 (two) times daily.   cyclobenzaprine (FLEXERIL) 5 MG tablet Take 1 tablet (5 mg total) by mouth at bedtime as needed for muscle spasms. Occasionally takes during the day   Ibuprofen (MOTRIN PO) Take by mouth as needed (liquid).   Levothyroxine Sodium 50 MCG CAPS Take by mouth daily before breakfast.   liothyronine (CYTOMEL) 5 MCG tablet Take by mouth.   Multiple Vitamin (MULTI-VITAMINS) TABS daily.    Omega-3 Fatty Acids (FISH OIL) 1000 MG CAPS Take by mouth. Takes occasionally due to causing acid reflux   risperiDONE (RISPERDAL) 1 MG tablet Take 1 mg by mouth at bedtime.   triamcinolone (NASACORT) 55 MCG/ACT AERO nasal inhaler Place 2 sprays into the nose as needed. Once  daily as needed.   Eflornithine HCl 13.9 % cream Use twice daily 8 hours apart (Patient not taking: No sig reported)   No facility-administered medications prior to visit.         Objective    BP 115/72   Pulse 67   Temp 98.1 F (36.7 C) (Oral)   Resp 15   Wt 128 lb 6.4 oz (58.2 kg)   LMP 04/10/2015 Comment: Only spotting  BMI 25.08 kg/m     Physical Exam  General appearance:  Well developed, well nourished female, cooperative and in no acute distress Head: Normocephalic, without obvious abnormality, atraumatic Respiratory: Respirations even and unlabored, normal respiratory rate Psych: Appropriate mood and affect. Neurologic: Mental status: Alert, oriented to person, place, and time, thought content appropriate.    Assessment & Plan     1. Need for hepatitis A and B vaccination  - Heplisav-B (HepB-CPG) Vaccine - Hepatitis A vaccine adult IM        The entirety of the information documented in the History of Present Illness, Review of Systems and Physical Exam were personally obtained by me. Portions of this information were initially documented by the CMA and reviewed by me for thoroughness and accuracy.     Lelon Huh, MD  [facility] [phone] (phone) [phone] (fax)  [facility]

## 2020-09-08 MED ORDER — LIOTHYRONINE SODIUM 5 MCG PO TABS: 10.0000 ug | ORAL_TABLET | Freq: Every day | ORAL | Status: DC

## 2020-09-16 DIAGNOSIS — Z85850 Personal history of malignant neoplasm of thyroid: Secondary | ICD-10-CM | POA: Diagnosis not present

## 2020-09-16 DIAGNOSIS — E890 Postprocedural hypothyroidism: Secondary | ICD-10-CM | POA: Diagnosis not present

## 2020-09-21 DIAGNOSIS — F209 Schizophrenia, unspecified: Secondary | ICD-10-CM | POA: Diagnosis not present

## 2020-09-21 DIAGNOSIS — F324 Major depressive disorder, single episode, in partial remission: Secondary | ICD-10-CM | POA: Diagnosis not present

## 2020-09-23 DIAGNOSIS — E890 Postprocedural hypothyroidism: Secondary | ICD-10-CM | POA: Diagnosis not present

## 2020-09-23 DIAGNOSIS — Z85850 Personal history of malignant neoplasm of thyroid: Secondary | ICD-10-CM | POA: Diagnosis not present

## 2020-10-02 MED ORDER — LIOTHYRONINE SODIUM 5 MCG PO TABS: 5.0000 ug | ORAL_TABLET | Freq: Every day | ORAL | Status: DC

## 2020-10-02 NOTE — Addendum Note (Signed)
Addended by: Birdie Sons on: 10/02/2020 06:08 PM   Modules accepted: Orders

## 2020-10-05 DIAGNOSIS — F324 Major depressive disorder, single episode, in partial remission: Secondary | ICD-10-CM | POA: Diagnosis not present

## 2020-10-05 DIAGNOSIS — F209 Schizophrenia, unspecified: Secondary | ICD-10-CM | POA: Diagnosis not present

## 2020-10-09 ENCOUNTER — Encounter: Payer: Self-pay | Admitting: Cardiology

## 2020-10-09 ENCOUNTER — Other Ambulatory Visit: Payer: Self-pay

## 2020-10-09 ENCOUNTER — Ambulatory Visit (INDEPENDENT_AMBULATORY_CARE_PROVIDER_SITE_OTHER): Payer: Medicare Other | Admitting: Cardiology

## 2020-10-09 VITALS — BP 114/80 | HR 64 | Ht 60.5 in | Wt 126.0 lb

## 2020-10-09 DIAGNOSIS — E7800 Pure hypercholesterolemia, unspecified: Secondary | ICD-10-CM | POA: Diagnosis not present

## 2020-10-09 DIAGNOSIS — I340 Nonrheumatic mitral (valve) insufficiency: Secondary | ICD-10-CM | POA: Diagnosis not present

## 2020-10-09 NOTE — Progress Notes (Signed)
Cardiology Office Note:    Date:  10/09/2020   ID:  Diamond Lucas, DOB 07-05-1960, MRN [id number]  PCP:  Mar Daring, PA-C  Cardiologist:  Kate Sable, MD  Electrophysiologist:  None   Referring MD: Florian Buff*   Chief Complaint  Patient presents with   Other    12 month follow up. Patient states after a nightmare she had a few extra beats for about 15 mins. Meds reviewed verbally with patient.     History of Present Illness:    Diamond Lucas is a 60 y.o. female with a hx of hypothyroidism, mitral regurgitation, schizophrenia who presents for follow-up.  Originally seen for palpitations, 2-week cardiac monitor did not show any significant arrhythmias.  Also has a history of mild MR she has a history of mild mitral regurgitation from echocardiogram in 2021 showed trivial MR.  EF was normal.  Had an episode of extra beats after a nightmare.  Otherwise, she denies chest pain, shortness of breath or palpitations.  Prior notes Echo 08/2019, EF 55 to 60%, trivial MR.   Past Medical History:  Diagnosis Date   Allergy    Arrhythmia    History of chicken pox    History of measles    History of syncope    Hypertension    Osteoarthritis    Palpitations    Pure hypercholesterolemia    Rosacea    Schizophrenia ([place])    Thyroid disease     Past Surgical History:  Procedure Laterality Date   COLONOSCOPY WITH PROPOFOL N/A 02/08/2018   Procedure: COLONOSCOPY WITH PROPOFOL;  Surgeon: Virgel Manifold, MD;  Location: [facility] ENDOSCOPY;  Service: Endoscopy;  Laterality: N/A;   FOOT SURGERY Right    THYROIDECTOMY      Current Medications: Current Meds  Medication Sig   Calcium Carbonate-Vit D-Min (CALCIUM 600+D PLUS MINERALS) 600-400 MG-UNIT TABS Take by mouth daily.    Carboxymeth-Glyc-Polysorb PF 0.5-1-0.5 % SOLN Apply 2 drops to eye 2 (two) times daily.   cyclobenzaprine (FLEXERIL) 5 MG tablet Take 1 tablet (5 mg total) by mouth at  bedtime as needed for muscle spasms. Occasionally takes during the day   Eflornithine HCl 13.9 % cream Use twice daily 8 hours apart   Ibuprofen (MOTRIN PO) Take by mouth as needed (liquid).   Levothyroxine Sodium 50 MCG CAPS Take by mouth daily before breakfast.   liothyronine (CYTOMEL) 5 MCG tablet Take 1 tablet (5 mcg total) by mouth daily.   Multiple Vitamin (MULTI-VITAMINS) TABS daily.    Omega-3 Fatty Acids (FISH OIL) 1000 MG CAPS Take by mouth. Takes occasionally due to causing acid reflux   risperiDONE (RISPERDAL) 1 MG tablet Take 1 mg by mouth at bedtime.   triamcinolone (NASACORT) 55 MCG/ACT AERO nasal inhaler Place 2 sprays into the nose as needed. Once daily as needed.     Allergies:   Diclofenac sodium, Naproxen, Olanzapine, Gluten meal, Mite (d. farinae), Nsaids, Other, and Synthroid [levothyroxine]   Social History   Socioeconomic History   Marital status: Single    Spouse name: Not on file   Number of children: 0   Years of education: Not on file   Highest education level: Bachelor's degree (e.g., BA, AB, BS)  Occupational History   Occupation: retired  Tobacco Use   Smoking status: Never   Smokeless tobacco: Never  Vaping Use   Vaping Use: Never used  Substance and Sexual Activity   Alcohol use: No   Drug use:  No   Sexual activity: Not Currently    Birth control/protection: Post-menopausal, Abstinence  Other Topics Concern   Not on file  Social History Narrative   Not on file   Social Determinants of Health   Financial Resource Strain: Not on file  Food Insecurity: Not on file  Transportation Needs: Not on file  Physical Activity: Not on file  Stress: Not on file  Social Connections: Not on file     Family History: The patient's family history includes Anxiety disorder in her brother; Cancer in an other family member; Cataracts in her mother; Depression in her mother; Diabetes in an other family member; Heart disease in an other family member; Heart  failure in an other family member; Kidney cancer in her father; Stroke in her mother and another family member.  ROS:   Please see the history of present illness.     All other systems reviewed and are negative.  EKGs/Labs/Other Studies Reviewed:    The following studies were reviewed today:   EKG:  EKG is ordered today.  Shows normal sinus rhythm, normal ECG.  Recent Labs: No results found for requested labs within last 8760 hours.  Recent Lipid Panel    Component Value Date/Time   CHOL 215 (H) 10/08/2019 1354   TRIG 192 (H) 10/08/2019 1354   HDL 48 10/08/2019 1354   CHOLHDL 3.4 10/31/2017 1352   VLDL 46 (H) 10/31/2017 1352   LDLCALC 133 (H) 10/08/2019 1354    Physical Exam:    VS:  BP 114/80 (BP Location: Left Arm, Patient Position: Sitting, Cuff Size: Normal)   Pulse 64   Ht 5' 0.5" (1.537 m)   Wt 126 lb (57.2 kg)   LMP 04/10/2015 Comment: Only spotting  SpO2 97%   BMI 24.20 kg/m     Wt Readings from Last 3 Encounters:  10/09/20 126 lb (57.2 kg)  09/07/20 128 lb 6.4 oz (58.2 kg)  04/16/20 126 lb 1.6 oz (57.2 kg)     GEN:  Well nourished, well developed in no acute distress HEENT: Normal NECK: No JVD; No carotid bruits LYMPHATICS: No lymphadenopathy CARDIAC: RRR, no murmurs, rubs, gallops RESPIRATORY:  Clear to auscultation without rales, wheezing or rhonchi  ABDOMEN: Soft, non-tender, non-distended MUSCULOSKELETAL:  No edema; No deformity  SKIN: Warm and dry NEUROLOGIC:  Alert and oriented x 3 PSYCHIATRIC:  Normal affect   ASSESSMENT:    1. Mitral valve insufficiency, unspecified etiology   2. Pure hypercholesterolemia     PLAN:    In order of problems listed above:  Patient with history of mild mitral regurgitation from echocardiogram back in 2015.  Repeat echo in 08/2128 normal systolic and diastolic function, EF 55 to 60%.  Follow-up of echocardiogram showed trivial MR.  Hyperlipidemia, 10-year ASCVD, demonstrates patient is not in statin  benefit group.  Continue low-cholesterol diet.  Follow-up as needed.  This note was generated in part or whole with voice recognition software. Voice recognition is usually quite accurate but there are transcription errors that can and very often do occur. I apologize for any typographical errors that were not detected and corrected.  Medication Adjustments/Labs and Tests Ordered: Current medicines are reviewed at length with the patient today.  Concerns regarding medicines are outlined above.  Orders Placed This Encounter  Procedures   EKG 12-Lead    No orders of the defined types were placed in this encounter.   Patient Instructions  Medication Instructions:  Your physician recommends that you continue on your  current medications as directed. Please refer to the Current Medication list given to you today.  *If you need a refill on your cardiac medications before your next appointment, please call your pharmacy*   Lab Work: None ordered If you have labs (blood work) drawn today and your tests are completely normal, you will receive your results only by: [place] (if you have MyChart) OR A paper copy in the mail If you have any lab test that is abnormal or we need to change your treatment, we will call you to review the results.   Testing/Procedures: None ordered   Follow-Up: At [facility], you and your health needs are our priority.  As part of our continuing mission to provide you with exceptional heart care, we have created designated Provider Care Teams.  These Care Teams include your primary Cardiologist (physician) and Advanced Practice Providers (APPs -  Physician Assistants and Nurse Practitioners) who all work together to provide you with the care you need, when you need it.  We recommend signing up for the patient portal called "MyChart".  Sign up information is provided on this After Visit Summary.  MyChart is used to connect with patients for Virtual  Visits (Telemedicine).  Patients are able to view lab/test results, encounter notes, upcoming appointments, etc.  Non-urgent messages can be sent to your provider as well.   To learn more about what you can do with MyChart, go to [email or web address].    Your next appointment:   Follow up as needed   The format for your next appointment:   In Person  Provider:   Kate Sable, MD   Other Instructions     Signed, Kate Sable, MD  10/09/2020 4:30 PM    [place]

## 2020-10-09 NOTE — Patient Instructions (Signed)

## 2020-10-19 DIAGNOSIS — F209 Schizophrenia, unspecified: Secondary | ICD-10-CM | POA: Diagnosis not present

## 2020-10-19 DIAGNOSIS — F324 Major depressive disorder, single episode, in partial remission: Secondary | ICD-10-CM | POA: Diagnosis not present

## 2020-11-16 DIAGNOSIS — H16041 Marginal corneal ulcer, right eye: Secondary | ICD-10-CM | POA: Diagnosis not present

## 2020-11-18 DIAGNOSIS — F324 Major depressive disorder, single episode, in partial remission: Secondary | ICD-10-CM | POA: Diagnosis not present

## 2020-11-18 DIAGNOSIS — F209 Schizophrenia, unspecified: Secondary | ICD-10-CM | POA: Diagnosis not present

## 2020-11-23 ENCOUNTER — Telehealth: Payer: Self-pay | Admitting: Obstetrics and Gynecology

## 2020-11-23 DIAGNOSIS — H16041 Marginal corneal ulcer, right eye: Secondary | ICD-10-CM | POA: Diagnosis not present

## 2020-11-23 NOTE — Telephone Encounter (Signed)
Pt called this morning asking for an apt with Dr.Cherry about her babies eye, stated that cherry could feel from outside through her skin to see if there needs to be any further imaging. Pt states that she looked down and it is the babies' left eye. I was very confused by conversation- I made pt apt 9-14 and made fikisha aware.

## 2020-11-24 NOTE — Telephone Encounter (Signed)
Please advise. Thanks Lashaundra Lehrmann 

## 2020-11-25 DIAGNOSIS — M47812 Spondylosis without myelopathy or radiculopathy, cervical region: Secondary | ICD-10-CM | POA: Diagnosis not present

## 2020-11-27 ENCOUNTER — Encounter: Payer: Self-pay | Admitting: Physician Assistant

## 2020-12-01 DIAGNOSIS — E890 Postprocedural hypothyroidism: Secondary | ICD-10-CM | POA: Diagnosis not present

## 2020-12-01 DIAGNOSIS — Z85850 Personal history of malignant neoplasm of thyroid: Secondary | ICD-10-CM | POA: Diagnosis not present

## 2020-12-02 DIAGNOSIS — F209 Schizophrenia, unspecified: Secondary | ICD-10-CM | POA: Diagnosis not present

## 2020-12-02 DIAGNOSIS — F324 Major depressive disorder, single episode, in partial remission: Secondary | ICD-10-CM | POA: Diagnosis not present

## 2020-12-08 DIAGNOSIS — Z85850 Personal history of malignant neoplasm of thyroid: Secondary | ICD-10-CM | POA: Diagnosis not present

## 2020-12-08 DIAGNOSIS — E890 Postprocedural hypothyroidism: Secondary | ICD-10-CM | POA: Diagnosis not present

## 2020-12-09 ENCOUNTER — Encounter: Payer: Medicare Other | Admitting: Obstetrics and Gynecology

## 2020-12-10 DIAGNOSIS — M542 Cervicalgia: Secondary | ICD-10-CM | POA: Insufficient documentation

## 2020-12-16 ENCOUNTER — Other Ambulatory Visit: Payer: Self-pay

## 2020-12-16 ENCOUNTER — Encounter: Payer: Self-pay | Admitting: Family Medicine

## 2020-12-16 ENCOUNTER — Ambulatory Visit (INDEPENDENT_AMBULATORY_CARE_PROVIDER_SITE_OTHER): Payer: Medicare Other | Admitting: Family Medicine

## 2020-12-16 VITALS — BP 140/81 | HR 75 | Temp 98.1°F | Wt 131.8 lb

## 2020-12-16 DIAGNOSIS — R5383 Other fatigue: Secondary | ICD-10-CM | POA: Diagnosis not present

## 2020-12-16 DIAGNOSIS — M542 Cervicalgia: Secondary | ICD-10-CM | POA: Diagnosis not present

## 2020-12-16 DIAGNOSIS — Z640 Problems related to unwanted pregnancy: Secondary | ICD-10-CM | POA: Diagnosis not present

## 2020-12-16 DIAGNOSIS — F22 Delusional disorders: Secondary | ICD-10-CM

## 2020-12-16 DIAGNOSIS — Z711 Person with feared health complaint in whom no diagnosis is made: Secondary | ICD-10-CM | POA: Diagnosis not present

## 2020-12-16 DIAGNOSIS — F312 Bipolar disorder, current episode manic severe with psychotic features: Secondary | ICD-10-CM

## 2020-12-16 LAB — POCT URINE PREGNANCY: Preg Test, Ur: NEGATIVE
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
## 2020-12-16 NOTE — Assessment & Plan Note (Signed)
Complaints of abdominal heaviness and fatigue; refused lab work up

## 2020-12-16 NOTE — Assessment & Plan Note (Signed)
Active delusions; coherent conversation; however, poor decision making

## 2020-12-16 NOTE — Assessment & Plan Note (Signed)
Has "many" fetuses inside of her; 1 which is named [place], has 1 eye.    She tried to have the fetuses speak with me; however, they were 'shy' and were 'just mumbling'  POCT urine negative
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
## 2020-12-17 DIAGNOSIS — F209 Schizophrenia, unspecified: Secondary | ICD-10-CM | POA: Diagnosis not present

## 2020-12-17 DIAGNOSIS — F324 Major depressive disorder, single episode, in partial remission: Secondary | ICD-10-CM | POA: Diagnosis not present

## 2020-12-24 DIAGNOSIS — M542 Cervicalgia: Secondary | ICD-10-CM | POA: Diagnosis not present

## 2020-12-29 ENCOUNTER — Other Ambulatory Visit: Payer: Self-pay

## 2020-12-29 ENCOUNTER — Ambulatory Visit (INDEPENDENT_AMBULATORY_CARE_PROVIDER_SITE_OTHER): Payer: Medicare Other | Admitting: Family Medicine

## 2020-12-29 ENCOUNTER — Encounter: Payer: Self-pay | Admitting: Family Medicine

## 2020-12-29 VITALS — BP 128/83 | HR 63 | Temp 98.0°F | Resp 16 | Wt 130.2 lb

## 2020-12-29 DIAGNOSIS — L309 Dermatitis, unspecified: Secondary | ICD-10-CM | POA: Insufficient documentation

## 2020-12-29 MED ORDER — TRIAMCINOLONE ACETONIDE 0.5 % EX OINT: 1.0000 "application " | TOPICAL_OINTMENT | Freq: Two times a day (BID) | CUTANEOUS | 0 refills | Status: DC

## 2020-12-29 NOTE — Progress Notes (Signed)
Established patient visit   Patient: Diamond Lucas   DOB: Aug 15, 1960   60 y.o. Female  MRN: [id number] Visit Date: 12/29/2020  Today's healthcare provider: Gwyneth Sprout, FNP   Chief Complaint  Patient presents with   Skin Problem    Patient comes office today with concerns of what she believes to be acne on her skin that has been present for over 6 months. Patient states that she has tried otc salicylic acid, lemon slices, cerave niacinamide, seabreeze and witch hazel   Subjective    HPI HPI     Skin Problem    Additional comments: Patient comes office today with concerns of what she believes to be acne on her skin that has been present for over 6 months. Patient states that she has tried otc salicylic acid, lemon slices, cerave niacinamide, seabreeze and witch hazel      Last edited by Minette Headland, CMA on 12/29/2020  1:09 PM.       Medications: Outpatient Medications Prior to Visit  Medication Sig   Calcium Carbonate-Vit D-Min (CALCIUM 600+D PLUS MINERALS) 600-400 MG-UNIT TABS Take by mouth daily.    Carboxymeth-Glyc-Polysorb PF 0.5-1-0.5 % SOLN Apply 2 drops to eye 2 (two) times daily.   cyclobenzaprine (FLEXERIL) 5 MG tablet Take 1 tablet (5 mg total) by mouth at bedtime as needed for muscle spasms. Occasionally takes during the day   Eflornithine HCl 13.9 % cream Use twice daily 8 hours apart   Ibuprofen (MOTRIN PO) Take by mouth as needed (liquid).   Levothyroxine Sodium 50 MCG CAPS Take by mouth daily before breakfast.   Levothyroxine Sodium 50 MCG CAPS Take by mouth.   liothyronine (CYTOMEL) 5 MCG tablet Take 1 tablet (5 mcg total) by mouth daily.   Multiple Vitamin (MULTI-VITAMINS) TABS daily.    Omega-3 Fatty Acids (FISH OIL) 1000 MG CAPS Take by mouth. Takes occasionally due to causing acid reflux   risperiDONE (RISPERDAL) 1 MG tablet Take 1 mg by mouth at bedtime.   triamcinolone (NASACORT) 55 MCG/ACT AERO nasal inhaler Place 2 sprays into  the nose as needed. Once daily as needed.   No facility-administered medications prior to visit.    Review of Systems     Objective    BP 128/83   Pulse 63   Temp 98 F (36.7 C) (Oral)   Resp 16   Wt 130 lb 3.2 oz (59.1 kg)   LMP 04/10/2015 Comment: Only spotting  BMI 25.01 kg/m  {Show previous vital signs (optional):23777}  Physical Exam Vitals and nursing note reviewed.  Constitutional:      General: She is not in acute distress.    Appearance: Normal appearance. She is overweight. She is not ill-appearing, toxic-appearing or diaphoretic.  HENT:     Head: Normocephalic and atraumatic.  Cardiovascular:     Rate and Rhythm: Normal rate and regular rhythm.     Pulses: Normal pulses.     Heart sounds: Normal heart sounds. No murmur heard.   No friction rub. No gallop.  Pulmonary:     Effort: Pulmonary effort is normal. No respiratory distress.     Breath sounds: Normal breath sounds. No stridor. No wheezing, rhonchi or rales.  Chest:     Chest wall: No tenderness.  Abdominal:     General: Bowel sounds are normal.     Palpations: Abdomen is soft.  Musculoskeletal:        General: No swelling, tenderness, deformity or signs  of injury. Normal range of motion.     Right lower leg: No edema.     Left lower leg: No edema.  Skin:    General: Skin is warm and dry.     Capillary Refill: Capillary refill takes less than 2 seconds.     Coloration: Skin is not jaundiced or pale.     Findings: Erythema and rash present. No bruising or lesion.       Neurological:     General: No focal deficit present.     Mental Status: She is alert and oriented to person, place, and time. Mental status is at baseline.     Cranial Nerves: No cranial nerve deficit.     Sensory: No sensory deficit.     Motor: No weakness.     Coordination: Coordination normal.  Psychiatric:        Mood and Affect: Mood normal.        Behavior: Behavior normal.        Thought Content: Thought content  normal.        Judgment: Judgment normal.     No results found for any visits on 12/29/20.  Assessment & Plan     Problem List Items Addressed This Visit       Musculoskeletal and Integument   Eczema of face - Primary   Relevant Medications   triamcinolone ointment (KENALOG) 0.5 %     Return if symptoms worsen or fail to improve.      Vonna Kotyk, FNP, have reviewed all documentation for this visit. The documentation on 12/29/20 for the exam, diagnosis, procedures, and orders are all accurate and complete.    Gwyneth Sprout, [place] [phone] (phone) [phone] (fax)  [place]

## 2020-12-29 NOTE — Assessment & Plan Note (Signed)
Recommend sparing use of steroid cream at site Advise satin pillow case Advise pulling hair up Advise cleaning face with gentle cleanser Advise not re-wearing disposable masks

## 2020-12-30 DIAGNOSIS — F209 Schizophrenia, unspecified: Secondary | ICD-10-CM | POA: Diagnosis not present

## 2020-12-30 DIAGNOSIS — F324 Major depressive disorder, single episode, in partial remission: Secondary | ICD-10-CM | POA: Diagnosis not present

## 2020-12-31 DIAGNOSIS — M542 Cervicalgia: Secondary | ICD-10-CM | POA: Diagnosis not present

## 2021-01-02 ENCOUNTER — Encounter: Payer: Self-pay | Admitting: Family Medicine

## 2021-01-07 DIAGNOSIS — M542 Cervicalgia: Secondary | ICD-10-CM | POA: Diagnosis not present

## 2021-01-13 DIAGNOSIS — H269 Unspecified cataract: Secondary | ICD-10-CM | POA: Diagnosis not present

## 2021-01-14 DIAGNOSIS — M542 Cervicalgia: Secondary | ICD-10-CM | POA: Diagnosis not present

## 2021-01-19 DIAGNOSIS — F209 Schizophrenia, unspecified: Secondary | ICD-10-CM | POA: Diagnosis not present

## 2021-01-19 DIAGNOSIS — F324 Major depressive disorder, single episode, in partial remission: Secondary | ICD-10-CM | POA: Diagnosis not present

## 2021-01-21 DIAGNOSIS — M542 Cervicalgia: Secondary | ICD-10-CM | POA: Diagnosis not present

## 2021-02-02 ENCOUNTER — Encounter: Payer: Self-pay | Admitting: Family Medicine

## 2021-02-02 DIAGNOSIS — Z23 Encounter for immunization: Secondary | ICD-10-CM | POA: Diagnosis not present

## 2021-02-03 DIAGNOSIS — F209 Schizophrenia, unspecified: Secondary | ICD-10-CM | POA: Diagnosis not present

## 2021-02-03 DIAGNOSIS — F324 Major depressive disorder, single episode, in partial remission: Secondary | ICD-10-CM | POA: Diagnosis not present

## 2021-02-11 ENCOUNTER — Encounter: Payer: Self-pay | Admitting: Family Medicine

## 2021-03-05 DIAGNOSIS — M5412 Radiculopathy, cervical region: Secondary | ICD-10-CM | POA: Diagnosis not present

## 2021-03-09 ENCOUNTER — Ambulatory Visit: Payer: Self-pay | Admitting: Family Medicine

## 2021-03-09 DIAGNOSIS — M5412 Radiculopathy, cervical region: Secondary | ICD-10-CM | POA: Insufficient documentation

## 2021-03-09 HISTORY — DX: Radiculopathy, cervical region: M54.12

## 2021-04-02 ENCOUNTER — Encounter: Payer: Self-pay | Admitting: Obstetrics and Gynecology

## 2021-04-06 DIAGNOSIS — F324 Major depressive disorder, single episode, in partial remission: Secondary | ICD-10-CM | POA: Diagnosis not present

## 2021-04-06 DIAGNOSIS — F209 Schizophrenia, unspecified: Secondary | ICD-10-CM | POA: Diagnosis not present

## 2021-04-09 ENCOUNTER — Encounter: Payer: Medicare Other | Admitting: Obstetrics and Gynecology

## 2021-04-26 ENCOUNTER — Telehealth: Payer: Self-pay | Admitting: Family Medicine

## 2021-04-26 NOTE — Telephone Encounter (Signed)
Copied from [place] [id number]. Topic: Medicare AWV >> Apr 26, 2021  3:21 PM Cher Nakai R wrote: Reason for CRM:  Left message for patient to call back and schedule Medicare Annual Wellness Visit (AWV) in office.   If not able to come in office, please offer to do virtually or by telephone.   Last AWV:  08/12/2019  Please schedule at anytime with [facility] Health Advisor.  If any questions, please contact me at [phone]

## 2021-04-27 ENCOUNTER — Encounter: Payer: Self-pay | Admitting: Family Medicine

## 2021-04-28 ENCOUNTER — Other Ambulatory Visit: Payer: Self-pay

## 2021-04-28 ENCOUNTER — Encounter: Payer: Self-pay | Admitting: Family Medicine

## 2021-04-28 ENCOUNTER — Ambulatory Visit (INDEPENDENT_AMBULATORY_CARE_PROVIDER_SITE_OTHER): Payer: Medicare Other | Admitting: Family Medicine

## 2021-04-28 VITALS — BP 157/83 | HR 59 | Temp 97.5°F | Resp 16 | Wt 137.5 lb

## 2021-04-28 DIAGNOSIS — F200 Paranoid schizophrenia: Secondary | ICD-10-CM

## 2021-04-28 DIAGNOSIS — F312 Bipolar disorder, current episode manic severe with psychotic features: Secondary | ICD-10-CM

## 2021-04-28 DIAGNOSIS — M542 Cervicalgia: Secondary | ICD-10-CM | POA: Diagnosis not present

## 2021-04-28 DIAGNOSIS — G44201 Tension-type headache, unspecified, intractable: Secondary | ICD-10-CM | POA: Diagnosis not present
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
## 2021-04-28 NOTE — Assessment & Plan Note (Signed)
Previous paranoia noted- continued symptoms remain reassurance provided BP elevated today; could be related to headache pain Pt also noted eating store brand pizza x2 meals in last 3 meals

## 2021-05-04 DIAGNOSIS — F324 Major depressive disorder, single episode, in partial remission: Secondary | ICD-10-CM | POA: Diagnosis not present

## 2021-05-04 DIAGNOSIS — F209 Schizophrenia, unspecified: Secondary | ICD-10-CM | POA: Diagnosis not present

## 2021-05-05 DIAGNOSIS — M542 Cervicalgia: Secondary | ICD-10-CM | POA: Diagnosis not present

## 2021-05-12 DIAGNOSIS — M542 Cervicalgia: Secondary | ICD-10-CM | POA: Diagnosis not present

## 2021-05-12 NOTE — Telephone Encounter (Signed)
Please see message below. KW 

## 2021-05-12 NOTE — Telephone Encounter (Signed)
Pt was calling back to get this scheduled, please advise if pt can come in office April 10th for AWV  appt.

## 2021-05-18 DIAGNOSIS — F324 Major depressive disorder, single episode, in partial remission: Secondary | ICD-10-CM | POA: Diagnosis not present

## 2021-05-18 DIAGNOSIS — F209 Schizophrenia, unspecified: Secondary | ICD-10-CM | POA: Diagnosis not present

## 2021-05-19 DIAGNOSIS — M542 Cervicalgia: Secondary | ICD-10-CM | POA: Diagnosis not present

## 2021-05-20 ENCOUNTER — Encounter: Payer: Medicare Other | Admitting: Obstetrics and Gynecology

## 2021-05-26 DIAGNOSIS — M542 Cervicalgia: Secondary | ICD-10-CM | POA: Diagnosis not present

## 2021-05-27 ENCOUNTER — Encounter: Payer: Self-pay | Admitting: Family Medicine

## 2021-05-28 ENCOUNTER — Other Ambulatory Visit: Payer: Self-pay | Admitting: Family Medicine

## 2021-05-28 DIAGNOSIS — M542 Cervicalgia: Secondary | ICD-10-CM

## 2021-06-02 DIAGNOSIS — M542 Cervicalgia: Secondary | ICD-10-CM | POA: Diagnosis not present

## 2021-06-02 DIAGNOSIS — F209 Schizophrenia, unspecified: Secondary | ICD-10-CM | POA: Diagnosis not present

## 2021-06-02 DIAGNOSIS — F324 Major depressive disorder, single episode, in partial remission: Secondary | ICD-10-CM | POA: Diagnosis not present

## 2021-06-04 DIAGNOSIS — M542 Cervicalgia: Secondary | ICD-10-CM | POA: Diagnosis not present

## 2021-06-07 NOTE — Patient Instructions (Incomplete)
Breast Self-Awareness °Breast self-awareness is knowing how your breasts look and feel. Doing breast self-awareness is important. It allows you to catch a breast problem early while it is still small and can be treated. All women should do breast self-awareness, including women who have had breast implants. Tell your doctor if you notice a change in your breasts. °What you need: °A mirror. °A well-lit room. °How to do a breast self-exam °A breast self-exam is one way to learn what is normal for your breasts and to check for changes. To do a breast self-exam: °Look for changes ° °Take off all the clothes above your waist. °Stand in front of a mirror in a room with good lighting. °Put your hands on your hips. °Push your hands down. °Look at your breasts and nipples in the mirror to see if one breast or nipple looks different from the other. Check to see if: °The shape of one breast is different. °The size of one breast is different. °There are wrinkles, dips, and bumps in one breast and not the other. °Look at each breast for changes in the skin, such as: °Redness. °Scaly areas. °Look for changes in your nipples, such as: °Liquid around the nipples. °Bleeding. °Dimpling. °Redness. °A change in where the nipples are. °Feel for changes ° °Lie on your back on the floor. °Feel each breast. To do this, follow these steps: °Pick a breast to feel. °Put the arm closest to that breast above your head. °Use your other arm to feel the nipple area of your breast. Feel the area with the pads of your three middle fingers by making small circles with your fingers. For the first circle, press lightly. For the second circle, press harder. For the third circle, press even harder. °Keep making circles with your fingers at the different pressures as you move down your breast. Stop when you feel your ribs. °Move your fingers a little toward the center of your body. °Start making circles with your fingers again, this time going up until  you reach your collarbone. °Keep making up-and-down circles until you reach your armpit. Remember to keep using the three pressures. °Feel the other breast in the same way. °Sit or stand in the tub or shower. °With soapy water on your skin, feel each breast the same way you did in step 2 when you were lying on the floor. °Write down what you find °Writing down what you find can help you remember what to tell your doctor. Write down: °What is normal for each breast. °Any changes you find in each breast, including: °The kind of changes you find. °Whether you have pain. °Size and location of any lumps. °When you last had your menstrual period. °General tips °Check your breasts every month. °If you are breastfeeding, the best time to check your breasts is after you feed your baby or after you use a breast pump. °If you get menstrual periods, the best time to check your breasts is 5-7 days after your menstrual period is over. °With time, you will become comfortable with the self-exam, and you will begin to know if there are changes in your breasts. °Contact a doctor if you: °See a change in the shape or size of your breasts or nipples. °See a change in the skin of your breast or nipples, such as red or scaly skin. °Have fluid coming from your nipples that is not normal. °Find a lump or thick area that was not there before. °Have pain in   your breasts. °Have any concerns about your breast health. °Summary °Breast self-awareness includes looking for changes in your breasts, as well as feeling for changes within your breasts. °Breast self-awareness should be done in front of a mirror in a well-lit room. °You should check your breasts every month. If you get menstrual periods, the best time to check your breasts is 5-7 days after your menstrual period is over. °Let your doctor know of any changes you see in your breasts, including changes in size, changes on the skin, pain or tenderness, or fluid from your nipples that is not  normal. °This information is not intended to replace advice given to you by your health care provider. Make sure you discuss any questions you have with your health care provider. °Document Revised: 10/31/2017 Document Reviewed: 10/31/2017 °Elsevier Patient Education © 2022 [employer] °Preventive Care 40-64 Years Old, Female °Preventive care refers to lifestyle choices and visits with your health care provider that can promote health and wellness. Preventive care visits are also called wellness exams. °What can I expect for my preventive care visit? °Counseling °Your health care provider may ask you questions about your: °Medical history, including: °Past medical problems. °Family medical history. °Pregnancy history. °Current health, including: °Menstrual cycle. °Method of birth control. °Emotional well-being. °Home life and relationship well-being. °Sexual activity and sexual health. °Lifestyle, including: °Alcohol, nicotine or tobacco, and drug use. °Access to firearms. °Diet, exercise, and sleep habits. °Work and work environment. °Sunscreen use. °Safety issues such as seatbelt and bike helmet use. °Physical exam °Your health care provider will check your: °Height and weight. These may be used to calculate your BMI (body mass index). BMI is a measurement that tells if you are at a healthy weight. °Waist circumference. This measures the distance around your waistline. This measurement also tells if you are at a healthy weight and may help predict your risk of certain diseases, such as type 2 diabetes and high blood pressure. °Heart rate and blood pressure. °Body temperature. °Skin for abnormal spots. °What immunizations do I need? °Vaccines are usually given at various ages, according to a schedule. Your health care provider will recommend vaccines for you based on your age, medical history, and lifestyle or other factors, such as travel or where you work. °What tests do I need? °Screening °Your health care  provider may recommend screening tests for certain conditions. This may include: °Lipid and cholesterol levels. °Diabetes screening. This is done by checking your blood sugar (glucose) after you have not eaten for a while (fasting). °Pelvic exam and Pap test. °Hepatitis B test. °Hepatitis C test. °HIV (human immunodeficiency virus) test. °STI (sexually transmitted infection) testing, if you are at risk. °Lung cancer screening. °Colorectal cancer screening. °Mammogram. Talk with your health care provider about when you should start having regular mammograms. This may depend on whether you have a family history of breast cancer. °BRCA-related cancer screening. This may be done if you have a family history of breast, ovarian, tubal, or peritoneal cancers. °Bone density scan. This is done to screen for osteoporosis. °Talk with your health care provider about your test results, treatment options, and if necessary, the need for more tests. °Follow these instructions at home: °Eating and drinking ° °Eat a diet that includes fresh fruits and vegetables, whole grains, lean protein, and low-fat dairy products. °Take vitamin and mineral supplements as recommended by your health care provider. °Do not drink alcohol if: °Your health care provider tells you not to drink. °You are pregnant,   may be pregnant, or are planning to become pregnant. °If you drink alcohol: °Limit how much you have to 0-1 drink a day. °Know how much alcohol is in your drink. In the [place] one drink equals one 12 oz bottle of beer (355 mL), one 5 oz glass of wine (148 mL), or one 1½ oz glass of hard liquor (44 mL). °Lifestyle °Brush your teeth every morning and night with fluoride toothpaste. Floss one time each day. °Exercise for at least 30 minutes 5 or more days each week. °Do not use any products that contain nicotine or tobacco. These products include cigarettes, chewing tobacco, and vaping devices, such as e-cigarettes. If you need help quitting, ask  your health care provider. °Do not use drugs. °If you are sexually active, practice safe sex. Use a condom or other form of protection to prevent STIs. °If you do not wish to become pregnant, use a form of birth control. If you plan to become pregnant, see your health care provider for a prepregnancy visit. °Take aspirin only as told by your health care provider. Make sure that you understand how much to take and what form to take. Work with your health care provider to find out whether it is safe and beneficial for you to take aspirin daily. °Find healthy ways to manage stress, such as: °Meditation, yoga, or listening to music. °Journaling. °Talking to a trusted person. °Spending time with friends and family. °Minimize exposure to UV radiation to reduce your risk of skin cancer. °Safety °Always wear your seat belt while driving or riding in a vehicle. °Do not drive: °If you have been drinking alcohol. Do not ride with someone who has been drinking. °When you are tired or distracted. °While texting. °If you have been using any mind-altering substances or drugs. °Wear a helmet and other protective equipment during sports activities. °If you have firearms in your house, make sure you follow all gun safety procedures. °Seek help if you have been physically or sexually abused. °What's next? °Visit your health care provider once a year for an annual wellness visit. °Ask your health care provider how often you should have your eyes and teeth checked. °Stay up to date on all vaccines. °This information is not intended to replace advice given to you by your health care provider. Make sure you discuss any questions you have with your health care provider. °Document Revised: 09/09/2020 Document Reviewed: 09/09/2020 °Elsevier Patient Education © 2022 [employer] ° °

## 2021-06-07 NOTE — Progress Notes (Unsigned)
ANNUAL PREVENTATIVE CARE GYNECOLOGY  ENCOUNTER NOTE  Subjective:       Diamond Lucas is a 61 y.o. G0P0000 female here for a routine annual gynecologic exam. G0P0000 female here for a routine annual gynecologic exam. She has a history of delusional disorder, bipolar I disorder, and hypothyroidism. The patient is not sexually active. She is not taking hormone replacement therapy. Patient denies post-menopausal vaginal bleeding. The patient wears seatbelts: yes. The patient participates in regular exercise: no.   Current complaints: 1.  ***    Gynecologic History Patient's last menstrual period was 04/10/2015. Contraception: post menopausal status Last Pap: 04/09/2019. Results were: abnormal. Positive for HPV Last mammogram: Never done.  Last Colonoscopy: 02/08/2018 Last Dexa Scan:    Obstetric History OB History  Gravida Para Term Preterm AB Living  0 0 0 0 0 0  SAB IAB Ectopic Multiple Live Births  0 0 0 0      Past Medical History:  Diagnosis Date   Allergy    Arrhythmia    Cervical radiculopathy 03/09/2021   History of chicken pox    History of measles    History of syncope    Hypertension    Osteoarthritis    Palpitations    Pure hypercholesterolemia    Rosacea    Schizophrenia ([place])    Thyroid disease     Family History  Problem Relation Age of Onset   Depression Mother    Cataracts Mother    Stroke Mother        mini   Cancer Other    Heart failure Other    Heart disease Other    Stroke Other    Diabetes Other    Kidney cancer Father    Anxiety disorder Brother     Past Surgical History:  Procedure Laterality Date   COLONOSCOPY WITH PROPOFOL N/A 02/08/2018   Procedure: COLONOSCOPY WITH PROPOFOL;  Surgeon: Virgel Manifold, MD;  Location: [facility] ENDOSCOPY;  Service: Endoscopy;  Laterality: N/A;   FOOT SURGERY Right    THYROIDECTOMY      Social History   Socioeconomic History   Marital status: Single    Spouse name: Not on file    Number of children: 0   Years of education: Not on file   Highest education level: Bachelor's degree (e.g., BA, AB, BS)  Occupational History   Occupation: retired  Tobacco Use   Smoking status: Never   Smokeless tobacco: Never  Vaping Use   Vaping Use: Never used  Substance and Sexual Activity   Alcohol use: No   Drug use: No   Sexual activity: Not Currently    Birth control/protection: Post-menopausal, Abstinence  Other Topics Concern   Not on file  Social History Narrative   Not on file   Social Determinants of Health   Financial Resource Strain: Not on file  Food Insecurity: Not on file  Transportation Needs: Not on file  Physical Activity: Not on file  Stress: Not on file  Social Connections: Not on file  Intimate Partner Violence: Not on file    Current Outpatient Medications on File Prior to Visit  Medication Sig Dispense Refill   Calcium Carbonate-Vit D-Min (CALCIUM 600+D PLUS MINERALS) 600-400 MG-UNIT TABS Take by mouth daily.      Carboxymeth-Glyc-Polysorb PF 0.5-1-0.5 % SOLN Apply 2 drops to eye 2 (two) times daily.     cyclobenzaprine (FLEXERIL) 5 MG tablet Take 1 tablet (5 mg total) by mouth at bedtime as needed for  muscle spasms. Occasionally takes during the day 30 tablet 0   Eflornithine HCl 13.9 % cream Use twice daily 8 hours apart 45 g 1   Ibuprofen (MOTRIN PO) Take by mouth as needed (liquid).     Levothyroxine Sodium 50 MCG CAPS Take by mouth daily before breakfast.     Levothyroxine Sodium 50 MCG CAPS Take by mouth.     liothyronine (CYTOMEL) 5 MCG tablet Take 1 tablet (5 mcg total) by mouth daily.     Multiple Vitamin (MULTI-VITAMINS) TABS daily.      Omega-3 Fatty Acids (FISH OIL) 1000 MG CAPS Take by mouth. Takes occasionally due to causing acid reflux     risperiDONE (RISPERDAL) 1 MG tablet Take 1 mg by mouth at bedtime.     triamcinolone (NASACORT) 55 MCG/ACT AERO nasal inhaler Place 2 sprays into the nose as needed. Once daily as needed.      triamcinolone ointment (KENALOG) 0.5 % Apply 1 application topically 2 (two) times daily. Apply sparingly to areas of eczema. 30 g 0   No current facility-administered medications on file prior to visit.    Allergies  Allergen Reactions   Diclofenac Sodium Palpitations   Naproxen Itching, Rash and Hives   Olanzapine     Other reaction(s): Other Cross-eyed and hyperventilation   Gluten Meal Other (See Comments)    Abdominal bloating   Mite (D. Farinae) Itching    Respiratory illness, and itchy eyes, eye problems Respiratory illness, and itchy eyes, eye problems   Nsaids Other (See Comments)    Other reaction(s): Other (See Comments) Aleve causes hives 10/15 pt states she occ. Takes Motrin. Other reaction(s): Other (See Comments) Aleve causes hives   Other Itching    Respiratory illness, and itchy eyes, eye problems Respiratory illness, and itchy eyes, eye problems   Synthroid [Levothyroxine]     Brand name only- causes acid reflux      Review of Systems ROS Review of Systems - General ROS: negative for - chills, fatigue, fever, hot flashes, night sweats, weight gain or weight loss Psychological ROS: negative for - anxiety, decreased libido, depression, mood swings, physical abuse or sexual abuse Ophthalmic ROS: negative for - blurry vision, eye pain or loss of vision ENT ROS: negative for - headaches, hearing change, visual changes or vocal changes Allergy and Immunology ROS: negative for - hives, itchy/watery eyes or seasonal allergies Hematological and Lymphatic ROS: negative for - bleeding problems, bruising, swollen lymph nodes or weight loss Endocrine ROS: negative for - galactorrhea, hair pattern changes, hot flashes, malaise/lethargy, mood swings, palpitations, polydipsia/polyuria, skin changes, temperature intolerance or unexpected weight changes Breast ROS: negative for - new or changing breast lumps or nipple discharge Respiratory ROS: negative for - cough or  shortness of breath Cardiovascular ROS: negative for - chest pain, irregular heartbeat, palpitations or shortness of breath Gastrointestinal ROS: no abdominal pain, change in bowel habits, or black or bloody stools Genito-Urinary ROS: no dysuria, trouble voiding, or hematuria Musculoskeletal ROS: negative for - joint pain or joint stiffness Neurological ROS: negative for - bowel and bladder control changes Dermatological ROS: negative for rash and skin lesion changes   Objective:   LMP 04/10/2015 Comment: Only spotting CONSTITUTIONAL: Well-developed, well-nourished female in no acute distress.  PSYCHIATRIC: Normal mood and affect. Normal behavior. Normal judgment and thought content. [place]: Alert and oriented to person, place, and time. Normal muscle tone coordination. No cranial nerve deficit noted. HENT:  Normocephalic, atraumatic, External right and left ear normal. Oropharynx is  clear and moist EYES: Conjunctivae and EOM are normal. Pupils are equal, round, and reactive to light. No scleral icterus.  NECK: Normal range of motion, supple, no masses.  Normal thyroid.  SKIN: Skin is warm and dry. No rash noted. Not diaphoretic. No erythema. No pallor. CARDIOVASCULAR: Normal heart rate noted, regular rhythm, no murmur. RESPIRATORY: Clear to auscultation bilaterally. Effort and breath sounds normal, no problems with respiration noted. BREASTS: Symmetric in size. No masses, skin changes, nipple drainage, or lymphadenopathy. ABDOMEN: Soft, normal bowel sounds, no distention noted.  No tenderness, rebound or guarding.  BLADDER: Normal PELVIC:  Bladder {:311640}  Urethra: {:311719}  Vulva: {:311722}  Vagina: {:311643}  Cervix: {:311644}  Uterus: {:311718}  Adnexa: {:311645}  RV: {Blank multiple:19196::"External Exam NormaI","No Rectal Masses","Normal Sphincter tone"}  MUSCULOSKELETAL: Normal range of motion. No tenderness.  No cyanosis, clubbing, or edema.  2+ distal  pulses. LYMPHATIC: No Axillary, Supraclavicular, or Inguinal Adenopathy.   Labs: Lab Results  Component Value Date   WBC 8.1 10/31/2017   HGB 14.8 10/31/2017   HCT 42.8 10/31/2017   MCV 91.8 10/31/2017   PLT 236 10/31/2017    Lab Results  Component Value Date   CREATININE 0.96 10/08/2019   BUN 22 10/08/2019   NA 139 10/08/2019   K 4.7 10/08/2019   CL 100 10/08/2019   CO2 27 10/08/2019    Lab Results  Component Value Date   ALT 28 10/08/2019   AST 22 10/08/2019   ALKPHOS 100 10/08/2019   BILITOT 0.2 10/08/2019    Lab Results  Component Value Date   CHOL 215 (H) 10/08/2019   HDL 48 10/08/2019   LDLCALC 133 (H) 10/08/2019   TRIG 192 (H) 10/08/2019   CHOLHDL 3.4 10/31/2017    Lab Results  Component Value Date   TSH 0.063 (L) 10/31/2017    Lab Results  Component Value Date   HGBA1C 5.1 10/31/2017     Assessment:   Annual gynecologic examination 61 y.o. Contraception: post menopausal status {Blank multiple:19196::"Normal BMI","Overweight","Obesity 1","Obesity 2"} Problem List Items Addressed This Visit   None   Plan:  Pap: {Blank multiple:19196::"Pap, Reflex if ASCUS","Pap Co Test","GC/CT NAAT","Not needed","Not done"} Mammogram: {Blank multiple:19196::"***","Ordered","Not Ordered","Not Indicated"} Stool Guaiac Testing:  {Blank multiple:19196::"***","Ordered","Not Ordered","Not Indicated"} Labs: {Blank multiple:19196::"Lipid 1","FBS","TSH","Hemoglobin A1C","Vit D Level""***"} Routine preventative health maintenance measures emphasized: Exercise/Diet/Weight control, Tobacco Warnings, Alcohol/Substance use risks, Stress Management, Peer Pressure Issues, and Safe Sex COVID Vaccination status: Return to [place], [place]

## 2021-06-08 ENCOUNTER — Other Ambulatory Visit: Payer: Self-pay

## 2021-06-08 ENCOUNTER — Ambulatory Visit (INDEPENDENT_AMBULATORY_CARE_PROVIDER_SITE_OTHER): Payer: Medicare Other | Admitting: Obstetrics and Gynecology

## 2021-06-08 ENCOUNTER — Other Ambulatory Visit (HOSPITAL_COMMUNITY)
Admission: RE | Admit: 2021-06-08 | Discharge: 2021-06-08 | Disposition: A | Discharge status: Home / Self Care | Payer: Medicare Other | Source: Ambulatory Visit | Attending: Obstetrics and Gynecology | Admitting: Obstetrics and Gynecology

## 2021-06-08 ENCOUNTER — Encounter: Payer: Self-pay | Admitting: Obstetrics and Gynecology

## 2021-06-08 ENCOUNTER — Other Ambulatory Visit (INDEPENDENT_AMBULATORY_CARE_PROVIDER_SITE_OTHER): Payer: Medicare Other

## 2021-06-08 VITALS — BP 115/64 | HR 74 | Resp 16 | Ht 61.0 in | Wt 138.2 lb

## 2021-06-08 DIAGNOSIS — R102 Pelvic and perineal pain: Secondary | ICD-10-CM | POA: Diagnosis not present

## 2021-06-08 DIAGNOSIS — Z124 Encounter for screening for malignant neoplasm of cervix: Secondary | ICD-10-CM | POA: Diagnosis not present

## 2021-06-08 DIAGNOSIS — Z1239 Encounter for other screening for malignant neoplasm of breast: Secondary | ICD-10-CM | POA: Diagnosis not present

## 2021-06-08 DIAGNOSIS — Z01419 Encounter for gynecological examination (general) (routine) without abnormal findings: Secondary | ICD-10-CM | POA: Insufficient documentation

## 2021-06-08 DIAGNOSIS — R21 Rash and other nonspecific skin eruption: Secondary | ICD-10-CM

## 2021-06-08 DIAGNOSIS — M5412 Radiculopathy, cervical region: Secondary | ICD-10-CM

## 2021-06-08 DIAGNOSIS — F200 Paranoid schizophrenia: Secondary | ICD-10-CM | POA: Diagnosis not present

## 2021-06-09 ENCOUNTER — Encounter: Payer: Self-pay | Admitting: Obstetrics and Gynecology

## 2021-06-09 ENCOUNTER — Ambulatory Visit
Admission: RE | Admit: 2021-06-09 | Discharge: 2021-06-09 | Disposition: A | Discharge status: Home / Self Care | Payer: Medicare Other | Source: Ambulatory Visit | Attending: Family Medicine | Admitting: Family Medicine

## 2021-06-09 DIAGNOSIS — M2578 Osteophyte, vertebrae: Secondary | ICD-10-CM | POA: Diagnosis not present

## 2021-06-09 DIAGNOSIS — E890 Postprocedural hypothyroidism: Secondary | ICD-10-CM | POA: Diagnosis not present

## 2021-06-09 DIAGNOSIS — M542 Cervicalgia: Secondary | ICD-10-CM | POA: Insufficient documentation

## 2021-06-09 IMAGING — MR MR CERVICAL SPINE W/O CM
5 series · 40 of 48 positions shown · non-contrast
Comparison: None.

CLINICAL DATA: Chronic neck pain, popping and cracking. Worse since
[DATE].

EXAM:
MRI CERVICAL SPINE WITHOUT CONTRAST
TECHNIQUE: Multiplanar, multisequence MR imaging of the cervical spine was
performed. No intravenous contrast was administered.

[Series 5: T2 · sagittal · 3.0mm · 0.62mm/px · 6 of 15 slices shown (1 of 2)]
[im 1/15]
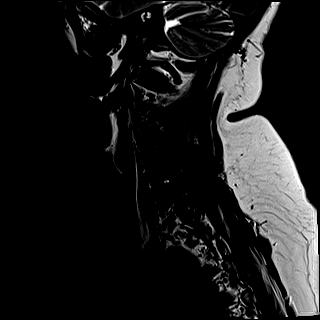
[im 3/15]
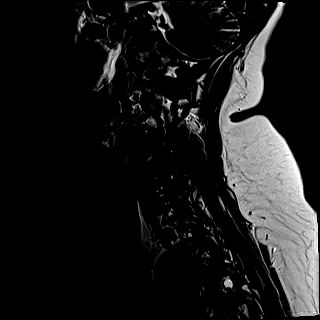
[im 6/15]
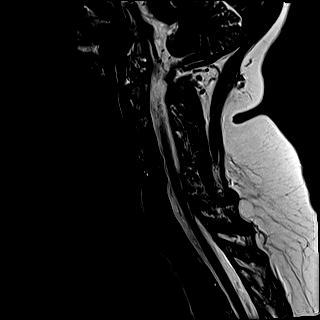
[im 9/15]
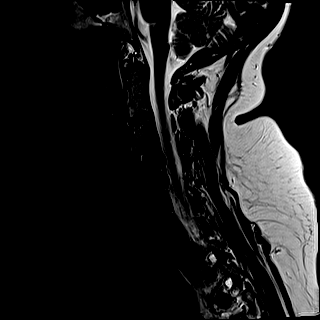
[im 12/15]
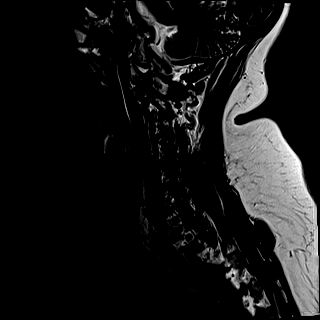
[im 15/15]
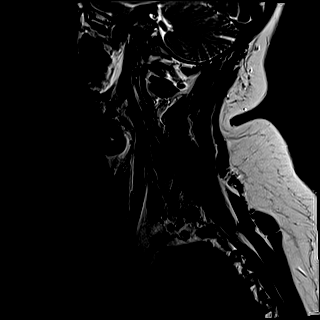

[Series 6: FLAIR · sagittal · 3.0mm · 0.78mm/px · 7 of 15 slices shown]
[im 1/15]
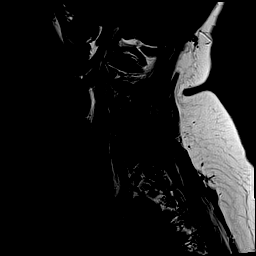
[im 3/15]
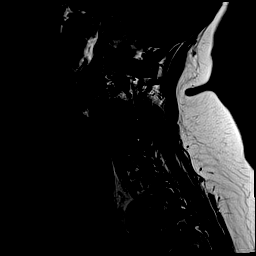
[im 5/15]
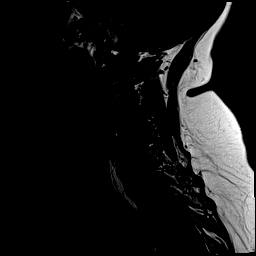
[im 8/15]
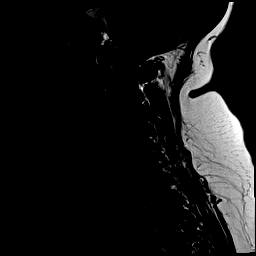
[im 10/15]
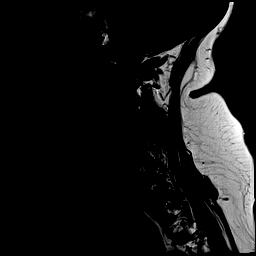
[im 12/15]
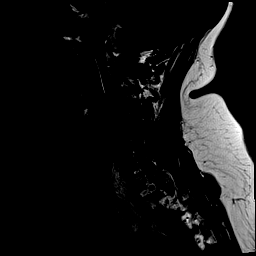
[im 15/15]
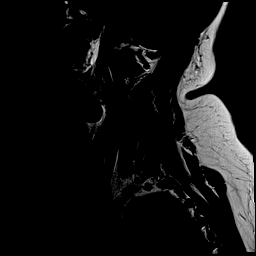

[Series 7: STIR · sagittal · 3.0mm · 0.62mm/px · 7 of 15 slices shown]
[im 1/15]
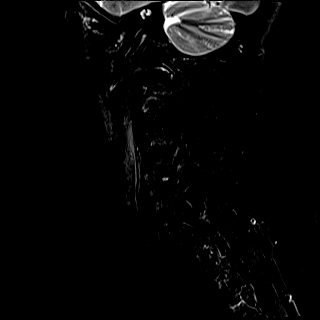
[im 3/15]
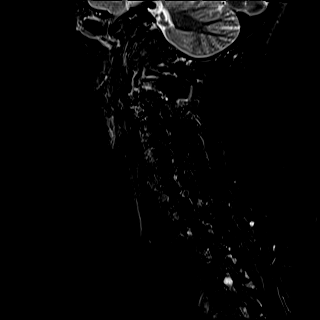
[im 5/15]
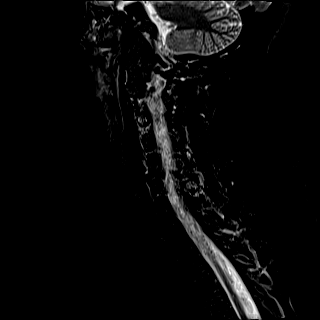
[im 8/15]
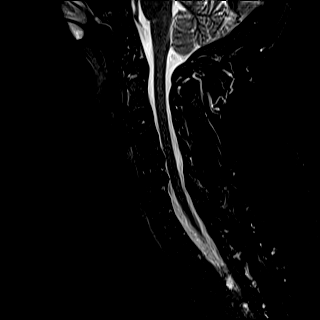
[im 10/15]
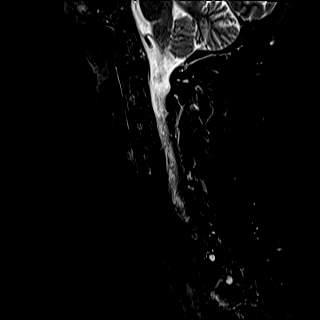
[im 12/15]
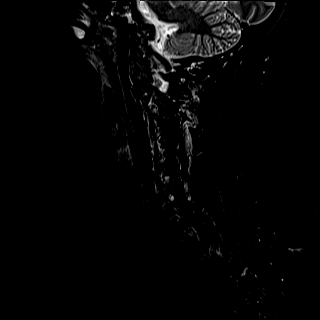
[im 15/15]
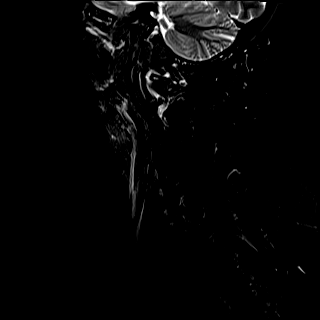

[Series 8: T2 · axial · 3.0mm · 0.70mm/px · z∈[-168,-72]mm · 12 of 29 slices shown (2 of 2)]
[im 1/29]
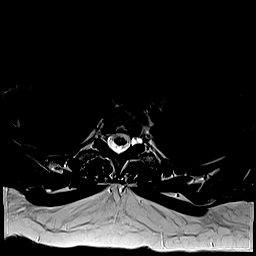
[im 3/29]
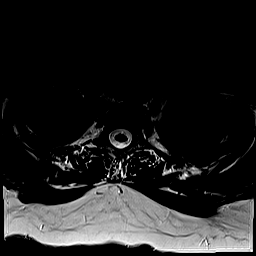
[im 5/29]
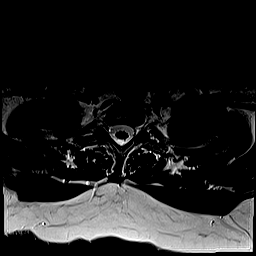
[im 7/29]
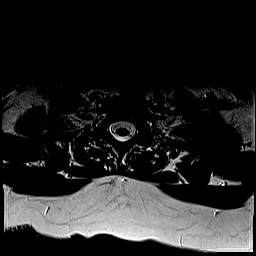
[im 9/29]
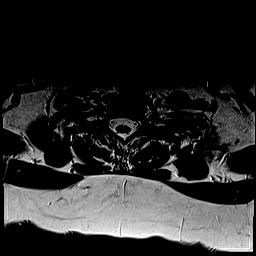
[im 11/29]
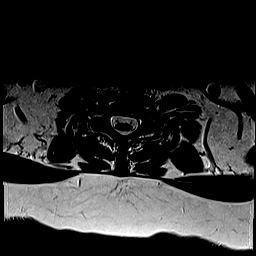
[im 13/29]
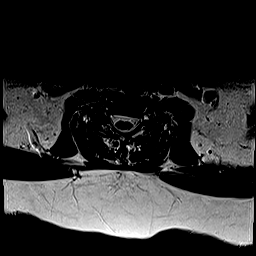
[im 16/29]
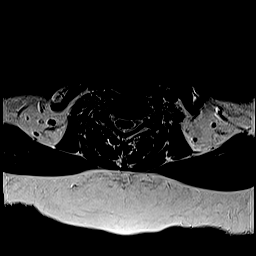
[im 18/29]
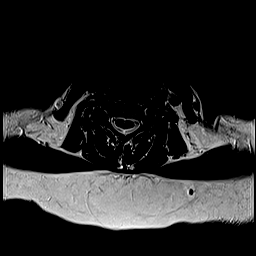
[im 20/29]
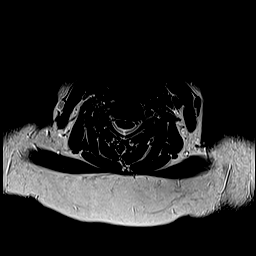
[im 24/29]
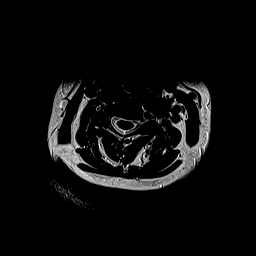
[im 29/29]
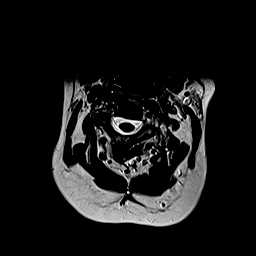

[Series 9: ax mpgr · axial · 3.0mm · 0.35mm/px · z∈[-168,-72]mm · 8 of 29 slices shown]
[im 1/29]
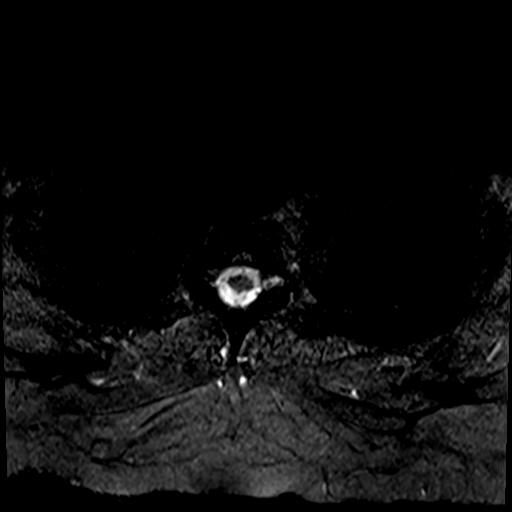
[im 5/29]
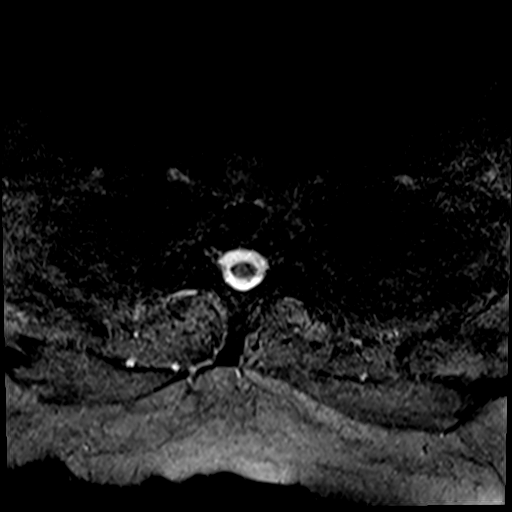
[im 9/29]
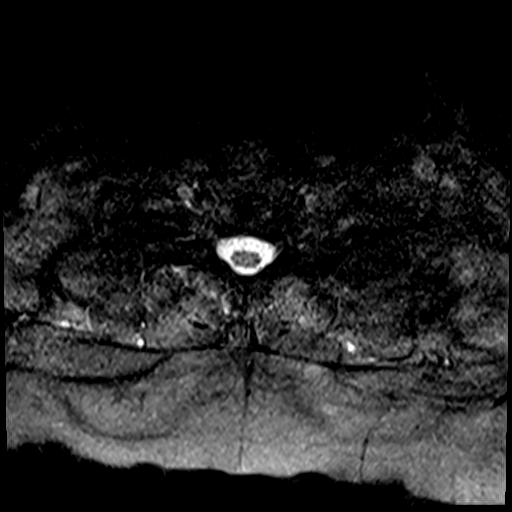
[im 13/29]
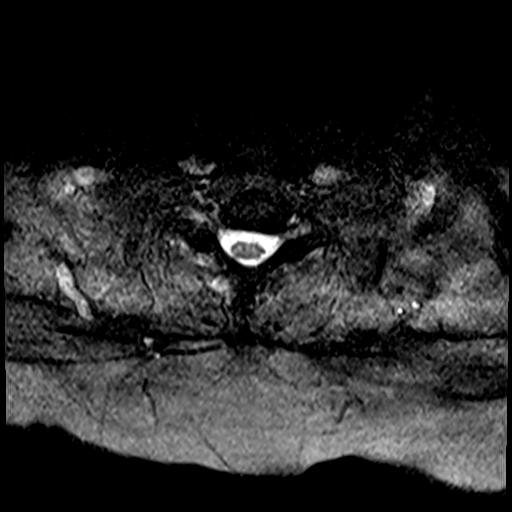
[im 16/29]
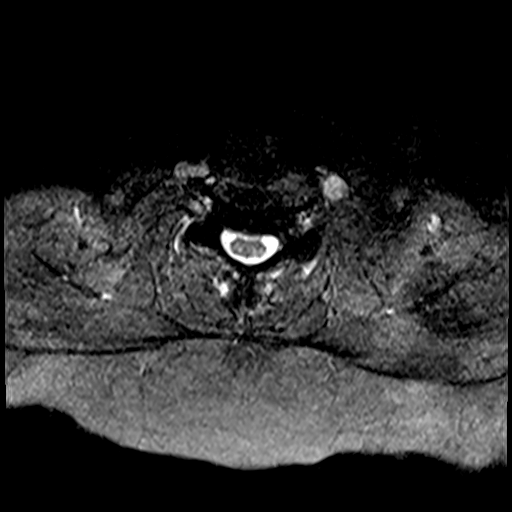
[im 20/29]
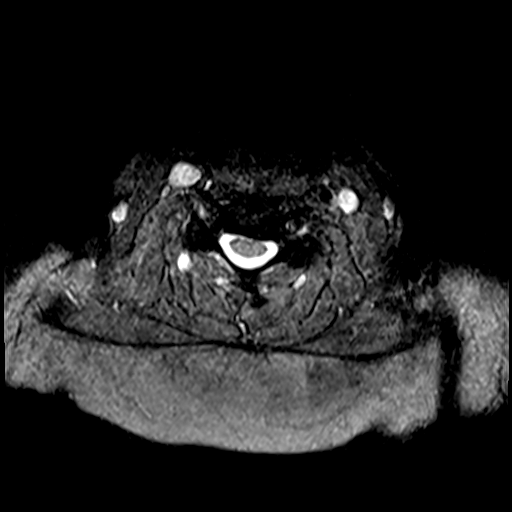
[im 24/29]
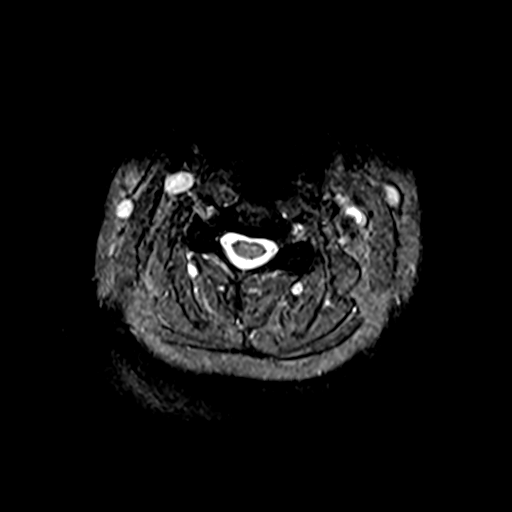
[im 29/29]
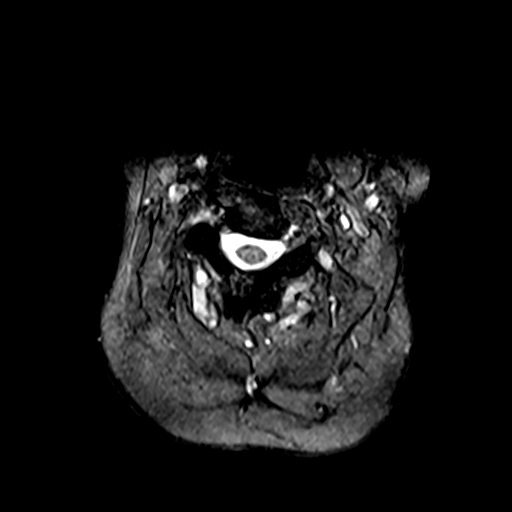

[40 of 48 positions shown; findings below may reference images not displayed]

FINDINGS: Alignment: Straightening of the normal cervical lordosis.

Vertebrae: No fracture or focal bone lesion.

Cord: No cord compression or focal cord lesion.

Posterior Fossa, vertebral arteries, paraspinal tissues: Normal

Disc levels:

Foramen magnum, C1-2, C2-3 and C3-4 levels are normal.

C4-5: Endplate osteophytes and mild bulging of the disc. Mild facet
osteoarthritis. No compressive canal or foraminal narrowing.

C5-6: Endplate osteophytes and bulging of the disc. No visible facet
arthritic changes. No stenosis of the canal or foramina.

C6-7: Normal interspace.

C7-T1: Normal interspace.
IMPRESSION: Mild non-compressive spondylosis at C4-5 and C5-6. Mild facet
osteoarthritis also present at C4-5. Findings could relate to
mechanical symptoms and neck pain as described in the history, but
there is no evidence of neural compression.

## 2021-06-11 ENCOUNTER — Encounter: Payer: Self-pay | Admitting: Obstetrics and Gynecology

## 2021-06-11 LAB — CYTOLOGY - PAP
Adequacy: ABSENT
Diagnosis: NEGATIVE

## 2021-06-14 ENCOUNTER — Other Ambulatory Visit: Payer: Self-pay | Admitting: Family Medicine

## 2021-06-14 ENCOUNTER — Encounter: Payer: Self-pay | Admitting: Family Medicine

## 2021-06-14 ENCOUNTER — Telehealth: Payer: Self-pay

## 2021-06-14 NOTE — Telephone Encounter (Signed)
Copied from [place] [id number]. Topic: General - Other ?>> Jun 14, 2021  1:42 PM Yvette Rack wrote: ?Reason for CRM: Pt stated she had MRI done last Wednesday but she still have not been able to view the results. Pt requested that the results be uploaded to [facility] so she can view them ?

## 2021-06-14 NOTE — Telephone Encounter (Signed)
Patient my chart message about the same thing was routed to provider. ?

## 2021-06-16 DIAGNOSIS — Z85850 Personal history of malignant neoplasm of thyroid: Secondary | ICD-10-CM | POA: Diagnosis not present

## 2021-06-16 DIAGNOSIS — E890 Postprocedural hypothyroidism: Secondary | ICD-10-CM | POA: Diagnosis not present

## 2021-06-22 ENCOUNTER — Encounter: Payer: Self-pay | Admitting: Family Medicine

## 2021-06-24 ENCOUNTER — Other Ambulatory Visit: Payer: Self-pay | Admitting: Family Medicine

## 2021-06-24 DIAGNOSIS — M542 Cervicalgia: Secondary | ICD-10-CM

## 2021-06-24 MED ORDER — CYCLOBENZAPRINE HCL 5 MG PO TABS: 5.0000 mg | ORAL_TABLET | Freq: Every day | ORAL | 1 refills | Status: DC | PRN

## 2021-06-30 DIAGNOSIS — F324 Major depressive disorder, single episode, in partial remission: Secondary | ICD-10-CM | POA: Diagnosis not present

## 2021-06-30 DIAGNOSIS — F209 Schizophrenia, unspecified: Secondary | ICD-10-CM | POA: Diagnosis not present

## 2021-07-05 ENCOUNTER — Ambulatory Visit (INDEPENDENT_AMBULATORY_CARE_PROVIDER_SITE_OTHER): Payer: Medicare Other

## 2021-07-05 ENCOUNTER — Ambulatory Visit: Payer: Medicare Other

## 2021-07-05 VITALS — Wt 138.0 lb

## 2021-07-05 DIAGNOSIS — Z0000 Encounter for general adult medical examination without abnormal findings: Secondary | ICD-10-CM | POA: Diagnosis not present

## 2021-07-05 DIAGNOSIS — C73 Malignant neoplasm of thyroid gland: Secondary | ICD-10-CM | POA: Insufficient documentation

## 2021-07-05 NOTE — Patient Instructions (Addendum)
Diamond Lucas , ?Thank you for taking time to come for your Medicare Wellness Visit. I appreciate your ongoing commitment to your health goals. Please review the following plan we discussed and let me know if I can assist you in the future.  ? ?Screening recommendations/referrals: ?Colonoscopy: 02/08/18 ?Mammogram: Dr.Cherry sees her, ordered an u/s for breast check up ?Bone Density: n/d ?Recommended yearly ophthalmology/optometry visit for glaucoma screening and checkup ?Recommended yearly dental visit for hygiene and checkup ? ?Vaccinations: ?Influenza vaccine: 02/02/21  ?Pneumococcal vaccine: 01/21/19 ?Tdap vaccine: 07/20/17 ?Shingles vaccine: ?  ?Covid-19: 06/20/19, 07/11/19 ? ?Advanced directives: no ? ?Conditions/risks identified: none ? ?Next appointment: Follow up in one year for your annual wellness visit. - 07/07/22 @ 10:15am by phone ? ?Preventive Care 40-64 Years, Female ?Preventive care refers to lifestyle choices and visits with your health care provider that can promote health and wellness. ?What does preventive care include? ?A yearly physical exam. This is also called an annual well check. ?Dental exams once or twice a year. ?Routine eye exams. Ask your health care provider how often you should have your eyes checked. ?Personal lifestyle choices, including: ?Daily care of your teeth and gums. ?Regular physical activity. ?Eating a healthy diet. ?Avoiding tobacco and drug use. ?Limiting alcohol use. ?Practicing safe sex. ?Taking low-dose aspirin daily starting at age 22. ?Taking vitamin and mineral supplements as recommended by your health care provider. ?What happens during an annual well check? ?The services and screenings done by your health care provider during your annual well check will depend on your age, overall health, lifestyle risk factors, and family history of disease. ?Counseling  ?Your health care provider may ask you questions about your: ?Alcohol use. ?Tobacco use. ?Drug use. ?Emotional  well-being. ?Home and relationship well-being. ?Sexual activity. ?Eating habits. ?Work and work Statistician. ?Method of birth control. ?Menstrual cycle. ?Pregnancy history. ?Screening  ?You may have the following tests or measurements: ?Height, weight, and BMI. ?Blood pressure. ?Lipid and cholesterol levels. These may be checked every 5 years, or more frequently if you are over 31 years old. ?Skin check. ?Lung cancer screening. You may have this screening every year starting at age 59 if you have a 30-pack-year history of smoking and currently smoke or have quit within the past 15 years. ?Fecal occult blood test (FOBT) of the stool. You may have this test every year starting at age 72. ?Flexible sigmoidoscopy or colonoscopy. You may have a sigmoidoscopy every 5 years or a colonoscopy every 10 years starting at age 33. ?Hepatitis C blood test. ?Hepatitis B blood test. ?Sexually transmitted disease (STD) testing. ?Diabetes screening. This is done by checking your blood sugar (glucose) after you have not eaten for a while (fasting). You may have this done every 1-3 years. ?Mammogram. This may be done every 1-2 years. Talk to your health care provider about when you should start having regular mammograms. This may depend on whether you have a family history of breast cancer. ?BRCA-related cancer screening. This may be done if you have a family history of breast, ovarian, tubal, or peritoneal cancers. ?Pelvic exam and Pap test. This may be done every 3 years starting at age 47. Starting at age 7, this may be done every 5 years if you have a Pap test in combination with an HPV test. ?Bone density scan. This is done to screen for osteoporosis. You may have this scan if you are at high risk for osteoporosis. ?Discuss your test results, treatment options, and if necessary, the need  for more tests with your health care provider. ?Vaccines  ?Your health care provider may recommend certain vaccines, such as: ?Influenza  vaccine. This is recommended every year. ?Tetanus, diphtheria, and acellular pertussis (Tdap, Td) vaccine. You may need a Td booster every 10 years. ?Zoster vaccine. You may need this after age 74. ?Pneumococcal 13-valent conjugate (PCV13) vaccine. You may need this if you have certain conditions and were not previously vaccinated. ?Pneumococcal polysaccharide (PPSV23) vaccine. You may need one or two doses if you smoke cigarettes or if you have certain conditions. ?Talk to your health care provider about which screenings and vaccines you need and how often you need them. ?This information is not intended to replace advice given to you by your health care provider. Make sure you discuss any questions you have with your health care provider. ?Document Released: 04/10/2015 Document Revised: 12/02/2015 Document Reviewed: 01/13/2015 ?Elsevier Interactive Patient Education ? 2017 [employer] ? ? ? ?Fall Prevention in the Home ?Falls can cause injuries. They can happen to people of all ages. There are many things you can do to make your home safe and to help prevent falls. ?What can I do on the outside of my home? ?Regularly fix the edges of walkways and driveways and fix any cracks. ?Remove anything that might make you trip as you walk through a door, such as a raised step or threshold. ?Trim any bushes or trees on the path to your home. ?Use bright outdoor lighting. ?Clear any walking paths of anything that might make someone trip, such as rocks or tools. ?Regularly check to see if handrails are loose or broken. Make sure that both sides of any steps have handrails. ?Any raised decks and porches should have guardrails on the edges. ?Have any leaves, snow, or ice cleared regularly. ?Use sand or salt on walking paths during winter. ?Clean up any spills in your garage right away. This includes oil or grease spills. ?What can I do in the bathroom? ?Use night lights. ?Install grab bars by the toilet and in the tub and  shower. Do not use towel bars as grab bars. ?Use non-skid mats or decals in the tub or shower. ?If you need to sit down in the shower, use a plastic, non-slip stool. ?Keep the floor dry. Clean up any water that spills on the floor as soon as it happens. ?Remove soap buildup in the tub or shower regularly. ?Attach bath mats securely with double-sided non-slip rug tape. ?Do not have throw rugs and other things on the floor that can make you trip. ?What can I do in the bedroom? ?Use night lights. ?Make sure that you have a light by your bed that is easy to reach. ?Do not use any sheets or blankets that are too big for your bed. They should not hang down onto the floor. ?Have a firm chair that has side arms. You can use this for support while you get dressed. ?Do not have throw rugs and other things on the floor that can make you trip. ?What can I do in the kitchen? ?Clean up any spills right away. ?Avoid walking on wet floors. ?Keep items that you use a lot in easy-to-reach places. ?If you need to reach something above you, use a strong step stool that has a grab bar. ?Keep electrical cords out of the way. ?Do not use floor polish or wax that makes floors slippery. If you must use wax, use non-skid floor wax. ?Do not have throw rugs and other  things on the floor that can make you trip. ?What can I do with my stairs? ?Do not leave any items on the stairs. ?Make sure that there are handrails on both sides of the stairs and use them. Fix handrails that are broken or loose. Make sure that handrails are as long as the stairways. ?Check any carpeting to make sure that it is firmly attached to the stairs. Fix any carpet that is loose or worn. ?Avoid having throw rugs at the top or bottom of the stairs. If you do have throw rugs, attach them to the floor with carpet tape. ?Make sure that you have a light switch at the top of the stairs and the bottom of the stairs. If you do not have them, ask someone to add them for  you. ?What else can I do to help prevent falls? ?Wear shoes that: ?Do not have high heels. ?Have rubber bottoms. ?Are comfortable and fit you well. ?Are closed at the toe. Do not wear sandals. ?If you use a stepladder: ?Ma

## 2021-07-05 NOTE — Progress Notes (Signed)
?Virtual Visit via Telephone Note ? ?I connected with  Diamond Lucas on 07/05/21 at 11:00 AM EDT by telephone and verified that I am speaking with the correct person using two identifiers. ? ?Location: ?Patient: home ?Provider: [facility] ?Persons participating in the virtual visit: patient/Nurse Health Advisor ?  ?I discussed the limitations, risks, security and privacy concerns of performing an evaluation and management service by telephone and the availability of in person appointments. The patient expressed understanding and agreed to proceed. ? ?Interactive audio and video telecommunications were attempted between this nurse and patient, however failed, due to patient having technical difficulties OR patient did not have access to video capability.  We continued and completed visit with audio only. ? ?Some vital signs may be absent or patient reported.  ? ?Dionisio David, LPN ? ?Subjective:  ? Diamond Lucas is a 61 y.o. female who presents for Medicare Annual (Subsequent) preventive examination. ? ?Review of Systems    ? ?  ? ?   ?Objective:  ?  ?There were no vitals filed for this visit. ?There is no height or weight on file to calculate BMI. ? ? ?  08/12/2019  ?  1:46 PM 02/08/2018  ?  8:13 AM 11/01/2017  ? 12:11 AM 07/12/2017  ?  2:44 PM 01/17/2017  ? 11:27 AM 09/08/2016  ? 11:56 AM 06/29/2016  ?  1:52 PM  ?Advanced Directives  ?Does Patient Have a Medical Advance Directive? Yes No  No Yes No Yes  ?Type of Paramedic of [place];Living will      Living will  ?Copy of [place] in Chart? Yes - validated most recent copy scanned in chart (See row information)        ?Would patient like information on creating a medical advance directive?    No - Patient declined  No - Patient declined   ?  ? Information is confidential and restricted. Go to Review Flowsheets to unlock data.  ? ? ?Current Medications (verified) ?Outpatient Encounter Medications as of 07/05/2021   ?Medication Sig  ? Levothyroxine Sodium (TIROSINT) 50 MCG CAPS Take by mouth.  ? Calcium Carbonate-Vit D-Min (CALCIUM 600+D PLUS MINERALS) 600-400 MG-UNIT TABS Take by mouth daily.   ? Carboxymeth-Glyc-Polysorb PF 0.5-1-0.5 % SOLN Apply 2 drops to eye 2 (two) times daily.  ? cyclobenzaprine (FLEXERIL) 5 MG tablet Take 1 tablet (5 mg total) by mouth daily as needed for muscle spasms.  ? Ibuprofen (MOTRIN PO) Take by mouth as needed (liquid).  ? Levothyroxine Sodium 50 MCG CAPS Take by mouth.  ? liothyronine (CYTOMEL) 5 MCG tablet Take 1 tablet (5 mcg total) by mouth daily.  ? Multiple Vitamin (MULTI-VITAMINS) TABS daily.   ? Omega-3 Fatty Acids (FISH OIL) 1000 MG CAPS Take by mouth. Takes occasionally due to causing acid reflux  ? risperiDONE (RISPERDAL) 1 MG tablet Take 1 mg by mouth at bedtime.  ? triamcinolone (NASACORT) 55 MCG/ACT AERO nasal inhaler Place 2 sprays into the nose as needed. Once daily as needed.  ? ?No facility-administered encounter medications on file as of 07/05/2021.  ? ? ?Allergies (verified) ?Diclofenac sodium, Naproxen, Olanzapine, Gluten meal, Mite (d. farinae), Nsaids, Other, and Synthroid [levothyroxine]  ? ?History: ?Past Medical History:  ?Diagnosis Date  ? Allergy   ? Arrhythmia   ? Cervical radiculopathy 03/09/2021  ? History of chicken pox   ? History of measles   ? History of syncope   ? Hypertension   ? Osteoarthritis   ?  Palpitations   ? Pure hypercholesterolemia   ? Rosacea   ? Schizophrenia ([place])   ? Thyroid disease   ? ?Past Surgical History:  ?Procedure Laterality Date  ? COLONOSCOPY WITH PROPOFOL N/A 02/08/2018  ? Procedure: COLONOSCOPY WITH PROPOFOL;  Surgeon: Virgel Manifold, MD;  Location: [facility] ENDOSCOPY;  Service: Endoscopy;  Laterality: N/A;  ? FOOT SURGERY Right   ? THYROIDECTOMY    ? ?Family History  ?Problem Relation Age of Onset  ? Depression Mother   ? Cataracts Mother   ? Stroke Mother   ?     mini  ? Cancer Other   ? Heart failure Other   ? Heart disease  Other   ? Stroke Other   ? Diabetes Other   ? Kidney cancer Father   ? Anxiety disorder Brother   ? ?Social History  ? ?Socioeconomic History  ? Marital status: Single  ?  Spouse name: Not on file  ? Number of children: 0  ? Years of education: Not on file  ? Highest education level: Bachelor's degree (e.g., BA, AB, BS)  ?Occupational History  ? Occupation: retired  ?Tobacco Use  ? Smoking status: Never  ? Smokeless tobacco: Never  ?Vaping Use  ? Vaping Use: Never used  ?Substance and Sexual Activity  ? Alcohol use: No  ? Drug use: No  ? Sexual activity: Not Currently  ?  Birth control/protection: Post-menopausal, Abstinence  ?Other Topics Concern  ? Not on file  ?Social History Narrative  ? Not on file  ? ?Social Determinants of Health  ? ?Financial Resource Strain: Not on file  ?Food Insecurity: Not on file  ?Transportation Needs: Not on file  ?Physical Activity: Not on file  ?Stress: Not on file  ?Social Connections: Not on file  ? ? ?Tobacco Counseling ?Counseling given: Not Answered ? ? ?Clinical Intake: ? ?Pre-visit preparation completed: No ? ?Pain : No/denies pain ? ?  ? ?Nutritional Risks: None ?Diabetes: No ? ?How often do you need to have someone help you when you read instructions, pamphlets, or other written materials from your doctor or pharmacy?: 1 - Never ? ?Diabetic?no ? ?Interpreter Needed?: No ? ?Information entered by :: Kirke Shaggy, LPN ? ? ?Activities of Daily Living ? ?  12/16/2020  ?  1:13 PM  ?In your present state of health, do you have any difficulty performing the following activities:  ?Hearing? 0  ?Vision? 1  ?Difficulty concentrating or making decisions? 1  ?Walking or climbing stairs? 0  ?Dressing or bathing? 0  ?Doing errands, shopping? 0  ? ? ?Patient Care Team: ?Gwyneth Sprout, FNP as PCP - General (Family Medicine) ?Kate Sable, MD as PCP - Cardiology (Cardiology) ?[name], MD as Referring Physician (Obstetrics and Gynecology) ?Birder Robson, MD as Referring  Physician (Ophthalmology) ?Ward, Honor Loh, MD as Referring Physician (Obstetrics and Gynecology) ?Charlcie Cradle, MD as Consulting Physician (Psychiatry) ?Miguel Dibble, LCSW as Education officer, museum (Licensed [employer] representative) ?Judi Cong, MD as Physician Assistant (Endocrinology) ? ?Indicate any recent Medical Services you may have received from other than [facility] providers in the past year (date may be approximate). ? ?   ?Assessment:  ? This is a routine wellness examination for Leroy. ? ?Hearing/Vision screen ?No results found. ? ?Dietary issues and exercise activities discussed: ?  ? ? Goals Addressed   ?None ?  ? ?Depression Screen ? ?  06/08/2021  ?  9:00 AM 12/16/2020  ?  1:13 PM 08/12/2019  ?  1:37 PM 06/11/2018  ?  4:29 PM 07/12/2017  ?  2:45 PM 06/29/2016  ?  1:53 PM  ?PHQ 2/9 Scores  ?PHQ - 2 Score '4 3 1 2 1 1  '$ ?PHQ- 9 Score 9 11      ?  ?Fall Risk ? ?  06/08/2021  ?  9:00 AM 12/16/2020  ?  1:12 PM 08/12/2019  ?  1:46 PM 06/11/2018  ?  4:29 PM 07/12/2017  ?  2:45 PM  ?Fall Risk   ?Falls in the past year? 0 0 0 0 No  ?Number falls in past yr: 0 0 0    ?Injury with Fall? 0 0 0    ?Risk for fall due to :  No Fall Risks     ? ? ?FALL RISK PREVENTION PERTAINING TO THE HOME: ? ?Any stairs in or around the home? Yes  ?If so, are there any without handrails? No  ?Home free of loose throw rugs in walkways, pet beds, electrical cords, etc? Yes  ?Adequate lighting in your home to reduce risk of falls? Yes  ? ?ASSISTIVE DEVICES UTILIZED TO PREVENT FALLS: ? ?Life alert? No  ?Use of a cane, walker or w/c? No  ?Grab bars in the bathroom? No  ?Shower chair or bench in shower? No  ?Elevated toilet seat or a handicapped toilet? No  ? ? ?Cognitive Function: ? ?  05/24/2019  ?  8:18 AM 04/13/2017  ? 11:03 AM  ?MMSE - Mini Mental State Exam  ?Orientation to time 5 5  ?Orientation to Place 5 5  ?Registration 3 3  ?Attention/ Calculation 3 5  ?Recall 0 3  ?Language- name 2 objects 1 2  ?Language- repeat 1 1  ?Language- follow 3 step  command 3 3  ?Language- read & follow direction 1 1  ?Write a sentence 1 1  ?Copy design 1 1  ?Total score 24 30  ? ?  ? ?  05/24/2019  ?  8:15 AM 07/12/2017  ?  2:49 PM 04/13/2017  ? 11:00 AM 06/29/2016  ?  2:00 PM  ?6CIT

## 2021-07-09 DIAGNOSIS — M542 Cervicalgia: Secondary | ICD-10-CM | POA: Diagnosis not present

## 2021-07-14 DIAGNOSIS — F209 Schizophrenia, unspecified: Secondary | ICD-10-CM | POA: Diagnosis not present

## 2021-07-14 DIAGNOSIS — F324 Major depressive disorder, single episode, in partial remission: Secondary | ICD-10-CM | POA: Diagnosis not present

## 2021-07-26 ENCOUNTER — Encounter: Payer: Self-pay | Admitting: Family Medicine

## 2021-07-28 ENCOUNTER — Telehealth: Payer: Self-pay

## 2021-07-28 NOTE — Telephone Encounter (Signed)
Copied from [place] [id number]. Topic: Appointment Scheduling - Scheduling Inquiry for Clinic ?>> Jul 28, 2021 12:11 PM Diamond Lucas wrote: ?Reason for CRM: Pt wants to schedule a CPE on May 17th, early morning. [facility] will not allow physical appt, it is forcing a MWV. ? ?Best contact: [phone] ?

## 2021-07-30 DIAGNOSIS — F324 Major depressive disorder, single episode, in partial remission: Secondary | ICD-10-CM | POA: Diagnosis not present

## 2021-07-30 DIAGNOSIS — F209 Schizophrenia, unspecified: Secondary | ICD-10-CM | POA: Diagnosis not present

## 2021-08-03 NOTE — Telephone Encounter (Addendum)
Pt called back and I advised her that Medicare does not pay for a physical.  We could schedule an OV and call it a yearly check up.  She declined to make that appt.  Pt actually has labs ordered from another dr she wanted to get done here, and was told to make appt and discuss w/ her dr. ?Pt states she is going to take her lab order to [place]. ?Nothing further needed. ?

## 2021-08-11 DIAGNOSIS — F209 Schizophrenia, unspecified: Secondary | ICD-10-CM | POA: Diagnosis not present

## 2021-08-11 DIAGNOSIS — F324 Major depressive disorder, single episode, in partial remission: Secondary | ICD-10-CM | POA: Diagnosis not present

## 2021-08-12 ENCOUNTER — Encounter: Payer: Self-pay | Admitting: Family Medicine

## 2021-08-12 DIAGNOSIS — Z79899 Other long term (current) drug therapy: Secondary | ICD-10-CM | POA: Diagnosis not present

## 2021-08-25 DIAGNOSIS — F324 Major depressive disorder, single episode, in partial remission: Secondary | ICD-10-CM | POA: Diagnosis not present

## 2021-08-25 DIAGNOSIS — F209 Schizophrenia, unspecified: Secondary | ICD-10-CM | POA: Diagnosis not present

## 2021-08-30 ENCOUNTER — Encounter: Payer: Self-pay | Admitting: Family Medicine

## 2021-08-31 ENCOUNTER — Telehealth: Payer: Self-pay

## 2021-08-31 NOTE — Telephone Encounter (Signed)
Copied from [place]. Topic: General - Other >> Aug 31, 2021  9:48 AM Alanda Slim E wrote: Reason for CRM: Pt is returning Tiffany's call about her refill for Cyclobenzaprine / please advise

## 2021-09-08 DIAGNOSIS — F209 Schizophrenia, unspecified: Secondary | ICD-10-CM | POA: Diagnosis not present

## 2021-09-08 DIAGNOSIS — F324 Major depressive disorder, single episode, in partial remission: Secondary | ICD-10-CM | POA: Diagnosis not present

## 2021-09-14 ENCOUNTER — Encounter: Payer: Self-pay | Admitting: Obstetrics and Gynecology

## 2021-09-23 DIAGNOSIS — F209 Schizophrenia, unspecified: Secondary | ICD-10-CM | POA: Diagnosis not present

## 2021-09-23 DIAGNOSIS — F324 Major depressive disorder, single episode, in partial remission: Secondary | ICD-10-CM | POA: Diagnosis not present

## 2021-09-29 ENCOUNTER — Encounter: Payer: Self-pay | Admitting: Obstetrics and Gynecology

## 2021-10-06 ENCOUNTER — Other Ambulatory Visit: Payer: Self-pay | Admitting: Obstetrics and Gynecology

## 2021-10-06 DIAGNOSIS — Z1239 Encounter for other screening for malignant neoplasm of breast: Secondary | ICD-10-CM

## 2021-10-06 DIAGNOSIS — Z1231 Encounter for screening mammogram for malignant neoplasm of breast: Secondary | ICD-10-CM

## 2021-10-26 DIAGNOSIS — F209 Schizophrenia, unspecified: Secondary | ICD-10-CM | POA: Diagnosis not present

## 2021-10-26 DIAGNOSIS — F324 Major depressive disorder, single episode, in partial remission: Secondary | ICD-10-CM | POA: Diagnosis not present

## 2021-11-08 ENCOUNTER — Encounter: Payer: Self-pay | Admitting: Obstetrics and Gynecology

## 2021-11-11 DIAGNOSIS — F324 Major depressive disorder, single episode, in partial remission: Secondary | ICD-10-CM | POA: Diagnosis not present

## 2021-11-11 DIAGNOSIS — F209 Schizophrenia, unspecified: Secondary | ICD-10-CM | POA: Diagnosis not present

## 2021-11-23 ENCOUNTER — Encounter: Payer: Self-pay | Admitting: Obstetrics and Gynecology

## 2021-11-25 DIAGNOSIS — F324 Major depressive disorder, single episode, in partial remission: Secondary | ICD-10-CM | POA: Diagnosis not present

## 2021-11-25 DIAGNOSIS — F209 Schizophrenia, unspecified: Secondary | ICD-10-CM | POA: Diagnosis not present

## 2021-12-03 NOTE — Progress Notes (Unsigned)
Established patient visit   Patient: Diamond Lucas   DOB: 10/30/1960   61 y.o. Female  MRN: [id number] Visit Date: 12/08/2021  Today's healthcare provider: Gwyneth Sprout, FNP  Re Introduced to nurse practitioner role and practice setting.  All questions answered.  Discussed provider/patient relationship and expectations.   I,Bradyn Soward J Danean Marner,acting as a scribe for Gwyneth Sprout, FNP.,have documented all relevant documentation on the behalf of Gwyneth Sprout, FNP,as directed by  Gwyneth Sprout, FNP while in the presence of Gwyneth Sprout, FNP.   Chief Complaint  Patient presents with   Arm Problem    Patient complains of L arm not straightening after extending it, starting around April. States it feels like the blood is pooling there.    Subjective    HPI HPI     Arm Problem    Additional comments: Patient complains of L arm not straightening after extending it, starting around April. States it feels like the blood is pooling there.       Last edited by Smitty Knudsen, CMA on 12/08/2021  8:11 AM.       Medications: Outpatient Medications Prior to Visit  Medication Sig   Calcium Carbonate-Vit D-Min (CALCIUM 600+D PLUS MINERALS) 600-400 MG-UNIT TABS Take by mouth daily.    Carboxymeth-Glyc-Polysorb PF 0.5-1-0.5 % SOLN Apply 2 drops to eye 2 (two) times daily.   cyclobenzaprine (FLEXERIL) 5 MG tablet Take 1 tablet (5 mg total) by mouth daily as needed for muscle spasms.   Ibuprofen (MOTRIN PO) Take by mouth as needed (liquid).   Levothyroxine Sodium 50 MCG CAPS Take by mouth.   Levothyroxine Sodium 50 MCG CAPS Take by mouth.   liothyronine (CYTOMEL) 5 MCG tablet Take 1 tablet (5 mcg total) by mouth daily.   Multiple Vitamin (MULTI-VITAMINS) TABS daily.    Omega-3 Fatty Acids (FISH OIL) 1000 MG CAPS Take by mouth. Takes occasionally due to causing acid reflux   risperiDONE (RISPERDAL) 1 MG tablet Take 1 mg by mouth at bedtime.   triamcinolone (NASACORT) 55 MCG/ACT  AERO nasal inhaler Place 2 sprays into the nose as needed. Once daily as needed.   No facility-administered medications prior to visit.    Review of Systems    Objective    BP 119/78 (BP Location: Right Arm, Patient Position: Sitting, Cuff Size: Normal)   Pulse 64   Resp 16   Ht '5\' 1"'$  (1.549 m)   Wt 129 lb (58.5 kg)   LMP 03/29/2015   SpO2 99%   BMI 24.37 kg/m   Physical Exam Vitals and nursing note reviewed.  Constitutional:      General: She is not in acute distress.    Appearance: Normal appearance. She is normal weight. She is not ill-appearing, toxic-appearing or diaphoretic.  HENT:     Head: Normocephalic and atraumatic.  Cardiovascular:     Rate and Rhythm: Normal rate and regular rhythm.     Pulses: Normal pulses.     Heart sounds: Normal heart sounds. No murmur heard.    No friction rub. No gallop.  Pulmonary:     Effort: Pulmonary effort is normal. No respiratory distress.     Breath sounds: Normal breath sounds. No stridor. No wheezing, rhonchi or rales.  Chest:     Chest wall: No tenderness.  Abdominal:     General: Bowel sounds are normal.     Palpations: Abdomen is soft.  Musculoskeletal:  General: Tenderness present. No swelling, deformity or signs of injury. Normal range of motion.       Arms:     Cervical back: Normal range of motion and neck supple. No rigidity or tenderness.     Right lower leg: No edema.     Left lower leg: No edema.     Comments: Area of L arm tenderness- noted following "flapping of arm" and "pooling of blood"  Skin:    General: Skin is warm and dry.     Capillary Refill: Capillary refill takes less than 2 seconds.     Coloration: Skin is not jaundiced or pale.     Findings: No bruising, erythema, lesion or rash.  Neurological:     General: No focal deficit present.     Mental Status: She is alert and oriented to person, place, and time. Mental status is at baseline.     Cranial Nerves: No cranial nerve deficit.      Sensory: No sensory deficit.     Motor: No weakness.     Coordination: Coordination normal.  Psychiatric:        Mood and Affect: Mood and affect normal.        Speech: Speech normal.        Behavior: Behavior normal.        Thought Content: Thought content is paranoid and delusional. Thought content does not include homicidal or suicidal ideation. Thought content does not include homicidal or suicidal plan.        Judgment: Judgment normal.      No results found for any visits on 12/08/21.  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         Other   Bipolar I disorder, most recent episode manic, severe with psychotic features ([place])    Chronic, stable History of delusions Patient is employed and lives alone Little concern for self harm or harm of others; verbally contracted for safety       Localized muscle weakness - Primary    Patient reports 5+ months of L arm pain following overhead tasks; previously treated for neck pain at ortho Denies recent falls or exacerbation of neck pain Reports that L arm stays up in "flapping motion like she is a homosexual man" When pt noticed L arm in raised position; she can put it down. Depending on how long it has been since she has done an overhead task there may be a "pooling of blood" on the forearm before the elbow Normal exam; -phalens -tinnel; pulses intact, normal temperature; no pallor; no ecchymosis or erythema. Pt is R  hand dominant  Reassurance provided; continue to monitor      Need for influenza vaccination    Consented; VIS made available; no immediate side effects following administration; plan to repeat annually        Relevant Orders   Flu Vaccine QUAD 6+ mos PF IM (Fluarix Quad PF) (Completed)   Paranoid schizophrenia ([place])  Chronic, stable History of delusions Patient is employed and lives alone Little concern for self harm or harm of others; verbally contracted for safety       No follow-ups on file.      Vonna Kotyk, FNP, have reviewed all documentation for this visit. The documentation on 12/08/21 for the exam, diagnosis, procedures, and orders are all accurate and complete.    Gwyneth Sprout, [place] [phone] (phone) [phone] (fax)  [place]

## 2021-12-07 ENCOUNTER — Encounter: Payer: Self-pay | Admitting: Obstetrics and Gynecology

## 2021-12-08 ENCOUNTER — Encounter: Payer: Self-pay | Admitting: Family Medicine

## 2021-12-08 ENCOUNTER — Ambulatory Visit (INDEPENDENT_AMBULATORY_CARE_PROVIDER_SITE_OTHER): Payer: Medicare Other | Admitting: Family Medicine

## 2021-12-08 VITALS — BP 119/78 | HR 64 | Resp 16 | Ht 61.0 in | Wt 129.0 lb

## 2021-12-08 DIAGNOSIS — Z23 Encounter for immunization: Secondary | ICD-10-CM | POA: Insufficient documentation

## 2021-12-08 DIAGNOSIS — I83811 Varicose veins of right lower extremities with pain: Secondary | ICD-10-CM

## 2021-12-08 DIAGNOSIS — M6281 Muscle weakness (generalized): Secondary | ICD-10-CM | POA: Diagnosis not present

## 2021-12-08 DIAGNOSIS — F200 Paranoid schizophrenia: Secondary | ICD-10-CM

## 2021-12-08 DIAGNOSIS — F312 Bipolar disorder, current episode manic severe with psychotic features: Secondary | ICD-10-CM | POA: Diagnosis not present

## 2021-12-08 NOTE — Assessment & Plan Note (Signed)
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

## 2021-12-08 NOTE — Assessment & Plan Note (Signed)
Chronic, stable History of delusions Patient is employed and lives alone Little concern for self harm or harm of others; verbally contracted for safety

## 2021-12-08 NOTE — Assessment & Plan Note (Signed)
Patient reports 5+ months of L arm pain following overhead tasks; previously treated for neck pain at ortho Denies recent falls or exacerbation of neck pain Reports that L arm stays up in "flapping motion like she is a homosexual man" When pt noticed L arm in raised position; she can put it down. Depending on how long it has been since she has done an overhead task there may be a "pooling of blood" on the forearm before the elbow Normal exam; -phalens -tinnel; pulses intact, normal temperature; no pallor; no ecchymosis or erythema. Pt is R hand dominant  Reassurance provided; continue to monitor

## 2021-12-08 NOTE — Assessment & Plan Note (Signed)
Consented; VIS made available; no immediate side effects following administration; plan to repeat annually   

## 2021-12-13 ENCOUNTER — Encounter: Payer: Self-pay | Admitting: Family Medicine

## 2021-12-13 DIAGNOSIS — F209 Schizophrenia, unspecified: Secondary | ICD-10-CM | POA: Diagnosis not present

## 2021-12-13 DIAGNOSIS — I83811 Varicose veins of right lower extremities with pain: Secondary | ICD-10-CM

## 2021-12-13 DIAGNOSIS — F324 Major depressive disorder, single episode, in partial remission: Secondary | ICD-10-CM | POA: Diagnosis not present

## 2021-12-29 DIAGNOSIS — F209 Schizophrenia, unspecified: Secondary | ICD-10-CM | POA: Diagnosis not present

## 2021-12-29 DIAGNOSIS — F324 Major depressive disorder, single episode, in partial remission: Secondary | ICD-10-CM | POA: Diagnosis not present

## 2022-01-10 ENCOUNTER — Encounter: Payer: Self-pay | Admitting: Obstetrics and Gynecology

## 2022-01-12 ENCOUNTER — Encounter (INDEPENDENT_AMBULATORY_CARE_PROVIDER_SITE_OTHER): Payer: Self-pay | Admitting: Vascular Surgery

## 2022-01-12 NOTE — Progress Notes (Addendum)
MRN : [id number]  Diamond Lucas is a 61 y.o. (1960/06/25) female who presents with chief complaint of varicose veins hurt.  History of Present Illness: The patient is seen for evaluation of symptomatic varicose veins. The patient relates burning and stinging which worsened steadily throughout the course of the day, particularly with standing. She relates a specific time that she felt the abrupt onset of pain and stinging on the left shin area and found a new varicosity associated with this episode.  The patient also notes an aching and throbbing pain over the varicosities, particularly with prolonged dependent positions. The symptoms are significantly improved with elevation.  The patient states the pain from the varicose veins interferes with work, daily exercise, shopping and household maintenance. At this point, the symptoms are persistent and severe enough that they're having a negative impact on lifestyle and are interfering with daily activities.  There is no history of DVT, PE or superficial thrombophlebitis. There is no history of ulceration or hemorrhage. The patient denies a significant family history of varicose veins.  The patient has worn graduated compression in the past. At the present time the patient has not been using over-the-counter analgesics. There is no history of prior surgical intervention or sclerotherapy.    No outpatient medications have been marked as taking for the 01/17/22 encounter (Appointment) with Delana Meyer, Dolores Lory, MD.    Past Medical History:  Diagnosis Date   Allergy    Arrhythmia    Cervical radiculopathy 03/09/2021   History of chicken pox    History of measles    History of syncope    Hypertension    Osteoarthritis    Palpitations    Pure hypercholesterolemia    Rosacea    Schizophrenia ([place])    Thyroid disease     Past Surgical History:  Procedure Laterality Date   COLONOSCOPY WITH PROPOFOL N/A 02/08/2018   Procedure:  COLONOSCOPY WITH PROPOFOL;  Surgeon: Virgel Manifold, MD;  Location: [facility] ENDOSCOPY;  Service: Endoscopy;  Laterality: N/A;   FOOT SURGERY Right    THYROIDECTOMY      Social History Social History   Tobacco Use   Smoking status: Never   Smokeless tobacco: Never  Vaping Use   Vaping Use: Never used  Substance Use Topics   Alcohol use: No   Drug use: No    Family History Family History  Problem Relation Age of Onset   Depression Mother    Cataracts Mother    Stroke Mother        mini   Cancer Other    Heart failure Other    Heart disease Other    Stroke Other    Diabetes Other    Kidney cancer Father    Anxiety disorder Brother     Allergies  Allergen Reactions   Diclofenac Sodium Palpitations   Naproxen Itching, Rash and Hives   Olanzapine     Other reaction(s): Other Cross-eyed and hyperventilation   Gluten Meal Other (See Comments)    Abdominal bloating   Mite (D. Farinae) Itching    Respiratory illness, and itchy eyes, eye problems Respiratory illness, and itchy eyes, eye problems   Nsaids Other (See Comments)    Other reaction(s): Other (See Comments) Aleve causes hives 10/15 pt states she occ. Takes Motrin. Other reaction(s): Other (See Comments) Aleve causes hives   Other Itching    Respiratory illness, and itchy eyes, eye problems Respiratory illness, and itchy eyes, eye problems  Synthroid [Levothyroxine]     Brand name only- causes acid reflux     REVIEW OF SYSTEMS (Negative unless checked)  Constitutional: '[]'$ Weight loss  '[]'$ Fever  '[]'$ Chills Cardiac: '[]'$ Chest pain   '[]'$ Chest pressure   '[]'$ Palpitations   '[]'$ Shortness of breath when laying flat   '[]'$ Shortness of breath with exertion. Vascular:  '[]'$ Pain in legs with walking   '[x]'$ Pain in legs with standing  '[]'$ History of DVT   '[]'$ Phlebitis   '[]'$ Swelling in legs   '[x]'$ Varicose veins   '[]'$ Non-healing ulcers Pulmonary:   '[]'$ Uses home oxygen   '[]'$ Productive cough   '[]'$ Hemoptysis   '[]'$ Wheeze  '[]'$ COPD    '[]'$ Asthma Neurologic:  '[]'$ Dizziness   '[]'$ Seizures   '[]'$ History of stroke   '[]'$ History of TIA  '[]'$ Aphasia   '[]'$ Vissual changes   '[]'$ Weakness or numbness in arm   '[]'$ Weakness or numbness in leg Musculoskeletal:   '[]'$ Joint swelling   '[]'$ Joint pain   '[]'$ Low back pain Hematologic:  '[]'$ Easy bruising  '[]'$ Easy bleeding   '[]'$ Hypercoagulable state   '[]'$ Anemic Gastrointestinal:  '[]'$ Diarrhea   '[]'$ Vomiting  '[]'$ Gastroesophageal reflux/heartburn   '[]'$ Difficulty swallowing. Genitourinary:  '[]'$ Chronic kidney disease   '[]'$ Difficult urination  '[]'$ Frequent urination   '[]'$ Blood in urine Skin:  '[]'$ Rashes   '[]'$ Ulcers  Psychological:  '[]'$ History of anxiety   '[]'$  History of major depression.  Physical Examination  There were no vitals filed for this visit. There is no height or weight on file to calculate BMI. Gen: WD/WN, NAD Head: [place]/AT, No temporalis wasting.  Ear/Nose/Throat: Hearing grossly intact, nares w/o erythema or drainage, pinna without lesions Eyes: PER, EOMI, sclera nonicteric.  Neck: Supple, no gross masses.  No JVD.  Pulmonary:  Good air movement, no audible wheezing, no use of accessory muscles.  Cardiac: RRR, precordium not hyperdynamic. Vascular:  Large varicosities present, greater than 6-8 mm bilaterally.  Veins are tender to palpation  Mild venous stasis changes to the legs bilaterally.  Trace soft pitting edema CEAP C3sEpAsPr Vessel Right Left  Radial Palpable Palpable  Gastrointestinal: soft, non-distended. No guarding/no peritoneal signs.  Musculoskeletal: M/S 5/5 throughout.  No deformity.  Neurologic: CN 2-12 intact. Pain and light touch intact in extremities.  Symmetrical.  Speech is fluent. Motor exam as listed above. Psychiatric: Judgment intact, Mood & affect appropriate for pt's clinical situation. Dermatologic: Venous rashes no ulcers noted.  No changes consistent with cellulitis. Lymph : No lichenification or skin changes of chronic lymphedema.  CBC Lab Results  Component Value Date   WBC 8.1 10/31/2017    HGB 14.8 10/31/2017   HCT 42.8 10/31/2017   MCV 91.8 10/31/2017   PLT 236 10/31/2017    BMET    Component Value Date/Time   NA 139 10/08/2019 1354   K 4.7 10/08/2019 1354   CL 100 10/08/2019 1354   CO2 27 10/08/2019 1354   GLUCOSE 81 10/08/2019 1354   GLUCOSE 104 (H) 10/31/2017 1355   BUN 22 10/08/2019 1354   CREATININE 0.96 10/08/2019 1354   CALCIUM 8.8 10/08/2019 1354   GFRNONAA 66 10/08/2019 1354   GFRAA 76 10/08/2019 1354   CrCl cannot be calculated (Patient's most recent lab result is older than the maximum 21 days allowed.).  COAG No results found for: "INR", "PROTIME"  Radiology No results found.   Assessment/Plan 1. Varicose veins with pain  Recommend:  The patient has large symptomatic varicose veins that are painful and associated with swelling.  I have had a long discussion with the patient regarding  varicose veins and why they cause symptoms.  Patient will begin wearing graduated compression stockings class 1 on a daily basis, beginning first thing in the morning and removing them in the evening. The patient is instructed specifically not to sleep in the stockings.    The patient  will also begin using over-the-counter analgesics such as Motrin 600 mg po TID to help control the symptoms.    In addition, behavioral modification including elevation during the day will be initiated.    Pending the results of these changes the  patient will be reevaluated in three months.   An ultrasound of the venous system will be obtained.   Further plans will be based on the ultrasound results and whether conservative therapies are successful at eliminating the pain and swelling.    2. Varicose veins of right lower extremity with pain  Recommend:  The patient has large symptomatic varicose veins that are painful and associated with swelling.  I have had a long discussion with the patient regarding  varicose veins and why they cause symptoms.  Patient will begin  wearing graduated compression stockings class 1 on a daily basis, beginning first thing in the morning and removing them in the evening. The patient is instructed specifically not to sleep in the stockings.    The patient  will also begin using over-the-counter analgesics such as Motrin 600 mg po TID to help control the symptoms.    In addition, behavioral modification including elevation during the day will be initiated.    Pending the results of these changes the  patient will be reevaluated in three months.   An ultrasound of the venous system will be obtained.   Further plans will be based on the ultrasound results and whether conservative therapies are successful at eliminating the pain and swelling.   - VAS [place] LOWER EXTREMITY VENOUS REFLUX; Future  3. Chronic venous insufficiency No surgery or intervention at this point in time.   The patient is CEAP C4sEpAsPr   I have discussed with the patient venous insufficiency and why it  causes symptoms. I have discussed with the patient the chronic skin changes that accompany venous insufficiency and the long term sequela such as infection and ulceration.  Patient will begin wearing graduated compression stockings or compression wraps on a daily basis.  The patient will put the compression on first thing in the morning and removing them in the evening. The patient is instructed specifically not to sleep in the compression.    In addition, behavioral modification including several periods of elevation of the lower extremities during the day will be continued. I have demonstrated that proper elevation is a position with the ankles at heart level.  The patient is instructed to begin routine exercise, especially walking on a daily basis  Patient should undergo duplex ultrasound of the venous system to ensure that DVT or reflux is not present.  Following the review of the ultrasound the patient will follow up in 2-3 months to reassess the degree of  swelling and the control that graduated compression stockings or compression wraps  is offering.   At that time the patient can be assessed for a Lymph Pump depending on the effectiveness of conservative therapy and the control of the associated lymphedema.   4. Primary hypertension Continue antihypertensive medications as already ordered, these medications have been reviewed and there are no changes at this time.     Hortencia Pilar, MD  01/12/2022 8:22 AM

## 2022-01-17 ENCOUNTER — Encounter (INDEPENDENT_AMBULATORY_CARE_PROVIDER_SITE_OTHER): Payer: Self-pay | Admitting: Vascular Surgery

## 2022-01-17 ENCOUNTER — Ambulatory Visit (INDEPENDENT_AMBULATORY_CARE_PROVIDER_SITE_OTHER): Payer: Medicare Other | Admitting: Vascular Surgery

## 2022-01-17 VITALS — BP 122/81 | HR 70 | Resp 17 | Ht 61.0 in | Wt 131.0 lb

## 2022-01-17 DIAGNOSIS — I10 Essential (primary) hypertension: Secondary | ICD-10-CM | POA: Diagnosis not present

## 2022-01-17 DIAGNOSIS — H269 Unspecified cataract: Secondary | ICD-10-CM | POA: Diagnosis not present

## 2022-01-17 DIAGNOSIS — I872 Venous insufficiency (chronic) (peripheral): Secondary | ICD-10-CM | POA: Diagnosis not present

## 2022-01-17 DIAGNOSIS — I83811 Varicose veins of right lower extremities with pain: Secondary | ICD-10-CM

## 2022-01-17 DIAGNOSIS — H524 Presbyopia: Secondary | ICD-10-CM | POA: Diagnosis not present

## 2022-01-17 DIAGNOSIS — I83819 Varicose veins of unspecified lower extremities with pain: Secondary | ICD-10-CM | POA: Diagnosis not present

## 2022-01-24 ENCOUNTER — Encounter (INDEPENDENT_AMBULATORY_CARE_PROVIDER_SITE_OTHER): Payer: Self-pay

## 2022-01-26 ENCOUNTER — Encounter: Payer: Self-pay | Admitting: Family Medicine

## 2022-01-27 DIAGNOSIS — F209 Schizophrenia, unspecified: Secondary | ICD-10-CM | POA: Diagnosis not present

## 2022-01-27 DIAGNOSIS — F324 Major depressive disorder, single episode, in partial remission: Secondary | ICD-10-CM | POA: Diagnosis not present

## 2022-01-28 ENCOUNTER — Encounter: Payer: Self-pay | Admitting: Family Medicine

## 2022-02-04 ENCOUNTER — Encounter: Payer: Self-pay | Admitting: Family Medicine

## 2022-02-09 DIAGNOSIS — F324 Major depressive disorder, single episode, in partial remission: Secondary | ICD-10-CM | POA: Diagnosis not present

## 2022-02-09 DIAGNOSIS — F209 Schizophrenia, unspecified: Secondary | ICD-10-CM | POA: Diagnosis not present

## 2022-02-09 NOTE — Progress Notes (Signed)
MRN : [id number]  Diamond Lucas is a 61 y.o. (02-28-1961) female who presents with chief complaint of varicose veins hurt.  History of Present Illness:   The patient returns for followup evaluation after her recent visit. The patient continues to have symptoms in the lower extremities. She notes it isn't really pain more like it feels like "worms ".  The symptoms seem to be lessened with elevation. Graduated compression stockings have been worn but the stockings do not eliminate the leg symptoms.   Venous ultrasound shows normal deep venous system, no evidence of acute or chronic DVT.  Superficial reflux is not present.  No outpatient medications have been marked as taking for the 02/10/22 encounter (Appointment) with Delana Meyer, Dolores Lory, MD.    Past Medical History:  Diagnosis Date   Allergy    Arrhythmia    Cervical radiculopathy 03/09/2021   History of chicken pox    History of measles    History of syncope    Hypertension    Osteoarthritis    Palpitations    Pure hypercholesterolemia    Rosacea    Schizophrenia ([place])    Thyroid disease     Past Surgical History:  Procedure Laterality Date   COLONOSCOPY WITH PROPOFOL N/A 02/08/2018   Procedure: COLONOSCOPY WITH PROPOFOL;  Surgeon: Virgel Manifold, MD;  Location: [facility] ENDOSCOPY;  Service: Endoscopy;  Laterality: N/A;   FOOT SURGERY Right    THYROIDECTOMY      Social History Social History   Tobacco Use   Smoking status: Never   Smokeless tobacco: Never  Vaping Use   Vaping Use: Never used  Substance Use Topics   Alcohol use: No   Drug use: No    Family History Family History  Problem Relation Age of Onset   Depression Mother    Cataracts Mother    Stroke Mother        mini   Cancer Other    Heart failure Other    Heart disease Other    Stroke Other    Diabetes Other    Kidney cancer Father    Anxiety disorder Brother     Allergies  Allergen Reactions   Diclofenac Sodium  Palpitations   Naproxen Itching, Rash and Hives   Olanzapine     Other reaction(s): Other Cross-eyed and hyperventilation   Gluten Meal Other (See Comments)    Abdominal bloating   Mite (D. Farinae) Itching    Respiratory illness, and itchy eyes, eye problems Respiratory illness, and itchy eyes, eye problems   Nsaids Other (See Comments)    Other reaction(s): Other (See Comments) Aleve causes hives 10/15 pt states she occ. Takes Motrin. Other reaction(s): Other (See Comments) Aleve causes hives   Other Itching    Respiratory illness, and itchy eyes, eye problems Respiratory illness, and itchy eyes, eye problems   Synthroid [Levothyroxine]     Brand name only- causes acid reflux     REVIEW OF SYSTEMS (Negative unless checked)  Constitutional: '[]'$ Weight loss  '[]'$ Fever  '[]'$ Chills Cardiac: '[]'$ Chest pain   '[]'$ Chest pressure   '[]'$ Palpitations   '[]'$ Shortness of breath when laying flat   '[]'$ Shortness of breath with exertion. Vascular:  '[]'$ Pain in legs with walking   '[x]'$ Pain in legs with standing  '[]'$ History of DVT   '[]'$ Phlebitis   '[]'$ Swelling in legs   '[x]'$ Varicose veins   '[]'$ Non-healing ulcers Pulmonary:   '[]'$ Uses home oxygen   '[]'$ Productive cough   '[]'$ Hemoptysis   '[]'$ Wheeze  '[]'$   COPD   '[]'$ Asthma Neurologic:  '[]'$ Dizziness   '[]'$ Seizures   '[]'$ History of stroke   '[]'$ History of TIA  '[]'$ Aphasia   '[]'$ Vissual changes   '[]'$ Weakness or numbness in arm   '[]'$ Weakness or numbness in leg Musculoskeletal:   '[]'$ Joint swelling   '[]'$ Joint pain   '[]'$ Low back pain Hematologic:  '[]'$ Easy bruising  '[]'$ Easy bleeding   '[]'$ Hypercoagulable state   '[]'$ Anemic Gastrointestinal:  '[]'$ Diarrhea   '[]'$ Vomiting  '[x]'$ Gastroesophageal reflux/heartburn   '[]'$ Difficulty swallowing. Genitourinary:  '[]'$ Chronic kidney disease   '[]'$ Difficult urination  '[]'$ Frequent urination   '[]'$ Blood in urine Skin:  '[]'$ Rashes   '[]'$ Ulcers  Psychological:  '[]'$ History of anxiety   '[]'$  History of major depression.  Physical Examination  There were no vitals filed for this visit. There is no  height or weight on file to calculate BMI. Gen: WD/WN, NAD Head: [place]/AT, No temporalis wasting.  Ear/Nose/Throat: Hearing grossly intact, nares w/o erythema or drainage, pinna without lesions Eyes: PER, EOMI, sclera nonicteric.  Neck: Supple, no gross masses.  No JVD.  Pulmonary:  Good air movement, no audible wheezing, no use of accessory muscles.  Cardiac: RRR, precordium not hyperdynamic. Vascular:  Scattered varicosities present bilaterally.  Mild venous stasis changes to the legs bilaterally.  Trace soft pitting edema CEAP C3sEpAsPr Vessel Right Left  Radial Palpable Palpable  Gastrointestinal: soft, non-distended. No guarding/no peritoneal signs.  Musculoskeletal: M/S 5/5 throughout.  No deformity.  Neurologic: CN 2-12 intact. Pain and light touch intact in extremities.  Symmetrical.  Speech is fluent. Motor exam as listed above. Psychiatric: Judgment intact, Mood & affect appropriate for pt's clinical situation. Dermatologic: Venous rashes no ulcers noted.  No changes consistent with cellulitis. Lymph : No lichenification or skin changes of chronic lymphedema.  CBC Lab Results  Component Value Date   WBC 8.1 10/31/2017   HGB 14.8 10/31/2017   HCT 42.8 10/31/2017   MCV 91.8 10/31/2017   PLT 236 10/31/2017    BMET    Component Value Date/Time   NA 139 10/08/2019 1354   K 4.7 10/08/2019 1354   CL 100 10/08/2019 1354   CO2 27 10/08/2019 1354   GLUCOSE 81 10/08/2019 1354   GLUCOSE 104 (H) 10/31/2017 1355   BUN 22 10/08/2019 1354   CREATININE 0.96 10/08/2019 1354   CALCIUM 8.8 10/08/2019 1354   GFRNONAA 66 10/08/2019 1354   GFRAA 76 10/08/2019 1354   CrCl cannot be calculated (Patient's most recent lab result is older than the maximum 21 days allowed.).  COAG No results found for: "INR", "PROTIME"  Radiology No results found.   Assessment/Plan 1. Varicose veins with inflammation Recommend:  The patient is complaining of varicose veins.    I have had a long  discussion with the patient regarding  varicose veins and why they cause symptoms.  Patient will begin wearing graduated compression stockings on a daily basis, beginning first thing in the morning and removing them in the evening. The patient is instructed specifically not to sleep in the stockings.    The patient  will also begin using over-the-counter analgesics such as Motrin 600 mg po TID to help control the symptoms as needed.    In addition, behavioral modification including elevation during the day will be initiated, utilizing a recliner was recommended.  The patient is also instructed to continue exercising such as walking 4-5 times per week.  At this time the patient wishes to continue conservative therapy and is not interested in more invasive treatments such as laser ablation and sclerotherapy.  The Patient will follow up if the symptoms worsen.  2. Chronic venous insufficiency See #1  3. Primary hypertension Continue antihypertensive medications as already ordered, these medications have been reviewed and there are no changes at this time.  4. Varicose veins of right lower extremity with pain See #1 - VAS [place] LOWER EXTREMITY VENOUS REFLUX    Hortencia Pilar, MD  02/09/2022 3:15 PM

## 2022-02-10 ENCOUNTER — Ambulatory Visit (INDEPENDENT_AMBULATORY_CARE_PROVIDER_SITE_OTHER): Payer: Medicare Other | Admitting: Vascular Surgery

## 2022-02-10 ENCOUNTER — Encounter (INDEPENDENT_AMBULATORY_CARE_PROVIDER_SITE_OTHER): Payer: Self-pay | Admitting: Vascular Surgery

## 2022-02-10 ENCOUNTER — Ambulatory Visit (INDEPENDENT_AMBULATORY_CARE_PROVIDER_SITE_OTHER): Payer: Medicare Other

## 2022-02-10 VITALS — BP 147/83 | HR 55 | Resp 16 | Wt 132.0 lb

## 2022-02-10 DIAGNOSIS — I872 Venous insufficiency (chronic) (peripheral): Secondary | ICD-10-CM | POA: Diagnosis not present

## 2022-02-10 DIAGNOSIS — I83812 Varicose veins of left lower extremities with pain: Secondary | ICD-10-CM | POA: Diagnosis not present

## 2022-02-10 DIAGNOSIS — I83819 Varicose veins of unspecified lower extremities with pain: Secondary | ICD-10-CM

## 2022-02-10 DIAGNOSIS — I10 Essential (primary) hypertension: Secondary | ICD-10-CM | POA: Diagnosis not present

## 2022-02-10 DIAGNOSIS — I83811 Varicose veins of right lower extremities with pain: Secondary | ICD-10-CM | POA: Diagnosis not present

## 2022-02-13 ENCOUNTER — Encounter (INDEPENDENT_AMBULATORY_CARE_PROVIDER_SITE_OTHER): Payer: Self-pay | Admitting: Vascular Surgery

## 2022-02-14 ENCOUNTER — Encounter: Payer: Self-pay | Admitting: Obstetrics and Gynecology

## 2022-02-22 ENCOUNTER — Encounter: Payer: Self-pay | Admitting: Family Medicine

## 2022-02-23 ENCOUNTER — Encounter: Payer: Self-pay | Admitting: Physician Assistant

## 2022-02-23 ENCOUNTER — Ambulatory Visit (INDEPENDENT_AMBULATORY_CARE_PROVIDER_SITE_OTHER): Payer: Medicare Other | Admitting: Physician Assistant

## 2022-02-23 VITALS — BP 124/77 | HR 85 | Temp 97.5°F | Ht 60.0 in | Wt 129.6 lb

## 2022-02-23 DIAGNOSIS — J069 Acute upper respiratory infection, unspecified: Secondary | ICD-10-CM | POA: Diagnosis not present

## 2022-02-23 LAB — POC COVID19 BINAXNOW: SARS Coronavirus 2 Ag: NEGATIVE

## 2022-02-23 LAB — POCT INFLUENZA A/B
Influenza A, POC: NEGATIVE
Influenza B, POC: NEGATIVE

## 2022-02-23 MED ORDER — AZELASTINE HCL 0.1 % NA SOLN: 1.0000 | Freq: Two times a day (BID) | NASAL | 0 refills | Status: DC

## 2022-02-23 MED ORDER — BENZONATATE 100 MG PO CAPS: 100.0000 mg | ORAL_CAPSULE | Freq: Two times a day (BID) | ORAL | 0 refills | Status: DC | PRN

## 2022-02-23 NOTE — Progress Notes (Signed)
I,Sha'taria Tyson,acting as a Education administrator for [employer], PA-C.,have documented all relevant documentation on the behalf of Mikey Kirschner, PA-C,as directed by  Mikey Kirschner, PA-C while in the presence of Mikey Kirschner, PA-C.   Established patient visit   Patient: Diamond Lucas   DOB: 10-10-1960   61 y.o. Female  MRN: [id number] Visit Date: 02/23/2022  Today's healthcare provider: Mikey Kirschner, PA-C   Cc. Cough, nasal congestion x 3 days  Subjective    HPI   Pt reports fatigue, body aches, cough with beige mucous, nasal congestion, bilateral ear pain, sinus pressure x 3 days. Reports congestion severe enough unable to breathe through her nose last night which made her feel like she had trouble breathing. Denies SOB at rest, wheezing ,fevers.  Taking tylenol, sudafed and mucinex otc. Reports ear pain resolved today. Patient reports she has had some coworkers sick off and on and some people at [employer].   Medications: Outpatient Medications Prior to Visit  Medication Sig   Calcium Carbonate-Vit D-Min (CALCIUM 600+D PLUS MINERALS) 600-400 MG-UNIT TABS Take by mouth daily.    Carboxymeth-Glyc-Polysorb PF 0.5-1-0.5 % SOLN Apply 2 drops to eye 2 (two) times daily.   cyclobenzaprine (FLEXERIL) 5 MG tablet Take 1 tablet (5 mg total) by mouth daily as needed for muscle spasms.   Ibuprofen (MOTRIN PO) Take by mouth as needed (liquid).   Levothyroxine Sodium 50 MCG CAPS Take by mouth.   liothyronine (CYTOMEL) 5 MCG tablet Take 1 tablet (5 mcg total) by mouth daily.   Multiple Vitamin (MULTI-VITAMINS) TABS daily.    Omega-3 Fatty Acids (FISH OIL) 1000 MG CAPS Take by mouth. Takes occasionally due to causing acid reflux   risperiDONE (RISPERDAL) 1 MG tablet Take 1 mg by mouth at bedtime.   triamcinolone (NASACORT) 55 MCG/ACT AERO nasal inhaler Place 2 sprays into the nose as needed. Once daily as needed.   UNABLE TO FIND Take 1 capsule by mouth 2 (two) times daily. Med Name:  VENEASE   Levothyroxine Sodium 50 MCG CAPS Take by mouth.   No facility-administered medications prior to visit.    Review of Systems  HENT:  Positive for congestion and ear pain.   Respiratory:  Positive for cough.      Objective    BP 124/77 (BP Location: Right Arm, Patient Position: Sitting, Cuff Size: Normal)   Pulse 85   Temp (!) 97.5 F (36.4 C) (Oral)   Ht 5' (1.524 m)   Wt 129 lb 9.6 oz (58.8 kg)   LMP 03/29/2015   SpO2 100%   BMI 25.31 kg/m  Blood pressure 124/77, pulse 85, temperature (!) 97.5 F (36.4 C), temperature source Oral, height 5' (1.524 m), weight 129 lb 9.6 oz (58.8 kg), last menstrual period 03/29/2015, SpO2 100 %.   Physical Exam Constitutional:      General: She is awake.     Appearance: She is well-developed.  HENT:     Head: Normocephalic.     Right Ear: Tympanic membrane normal.     Left Ear: Tympanic membrane normal.     Nose: Congestion and rhinorrhea present.     Mouth/Throat:     Pharynx: Posterior oropharyngeal erythema present. No oropharyngeal exudate.  Eyes:     Conjunctiva/sclera: Conjunctivae normal.  Cardiovascular:     Rate and Rhythm: Normal rate and regular rhythm.     Heart sounds: Normal heart sounds.  Pulmonary:     Effort: Pulmonary effort is normal.  Breath sounds: Normal breath sounds. No wheezing, rhonchi or rales.  Skin:    General: Skin is warm.  Neurological:     Mental Status: She is alert and oriented to person, place, and time.  Psychiatric:        Attention and Perception: Attention normal.        Mood and Affect: Mood normal.        Speech: Speech normal.        Behavior: Behavior is cooperative.     Results for orders placed or performed in visit on 02/23/22  POC COVID-19  Result Value Ref Range   SARS Coronavirus 2 Ag Negative Negative  POCT Influenza A/B  Result Value Ref Range   Influenza A, POC Negative Negative   Influenza B, POC Negative Negative    Assessment & Plan     URI Poc  flu/covid negative Advised adding antihistamine-- zyrtec, claritin Rx tessalon for cough  Rx azelastine nasal spray in addition to OTC steroid nasal spray  Increase fluids If symptoms persist to 7-10 days please call office for more recommendations  Return if symptoms worsen or fail to improve.      I, Mikey Kirschner, PA-C have reviewed all documentation for this visit. The documentation on  02/23/2022  for the exam, diagnosis, procedures, and orders are all accurate and complete.  Mikey Kirschner, PA-C [facility] [address] [place], [place], [address] Office: [phone] Fax: [place]

## 2022-02-28 ENCOUNTER — Other Ambulatory Visit: Payer: Self-pay | Admitting: Physician Assistant

## 2022-02-28 ENCOUNTER — Encounter: Payer: Self-pay | Admitting: Physician Assistant

## 2022-02-28 MED ORDER — AMOXICILLIN 875 MG PO TABS: 875.0000 mg | ORAL_TABLET | Freq: Two times a day (BID) | ORAL | 0 refills | Status: AC

## 2022-03-01 ENCOUNTER — Encounter: Payer: Self-pay | Admitting: Physician Assistant

## 2022-03-02 ENCOUNTER — Encounter: Payer: Self-pay | Admitting: Physician Assistant

## 2022-03-05 ENCOUNTER — Encounter: Payer: Self-pay | Admitting: Family Medicine

## 2022-03-15 DIAGNOSIS — F209 Schizophrenia, unspecified: Secondary | ICD-10-CM | POA: Diagnosis not present

## 2022-03-15 DIAGNOSIS — F324 Major depressive disorder, single episode, in partial remission: Secondary | ICD-10-CM | POA: Diagnosis not present

## 2022-03-22 DIAGNOSIS — Z23 Encounter for immunization: Secondary | ICD-10-CM | POA: Diagnosis not present

## 2022-04-01 DIAGNOSIS — F324 Major depressive disorder, single episode, in partial remission: Secondary | ICD-10-CM | POA: Diagnosis not present

## 2022-04-01 DIAGNOSIS — F209 Schizophrenia, unspecified: Secondary | ICD-10-CM | POA: Diagnosis not present

## 2022-04-08 ENCOUNTER — Encounter: Payer: Self-pay | Admitting: Family Medicine

## 2022-04-13 ENCOUNTER — Encounter: Payer: Self-pay | Admitting: Obstetrics and Gynecology

## 2022-04-27 DIAGNOSIS — F324 Major depressive disorder, single episode, in partial remission: Secondary | ICD-10-CM | POA: Diagnosis not present

## 2022-04-27 DIAGNOSIS — F209 Schizophrenia, unspecified: Secondary | ICD-10-CM | POA: Diagnosis not present

## 2022-05-05 DIAGNOSIS — F324 Major depressive disorder, single episode, in partial remission: Secondary | ICD-10-CM | POA: Diagnosis not present

## 2022-05-05 DIAGNOSIS — F209 Schizophrenia, unspecified: Secondary | ICD-10-CM | POA: Diagnosis not present

## 2022-05-18 ENCOUNTER — Telehealth: Payer: Self-pay | Admitting: Family Medicine

## 2022-05-18 DIAGNOSIS — F209 Schizophrenia, unspecified: Secondary | ICD-10-CM | POA: Diagnosis not present

## 2022-05-18 DIAGNOSIS — F324 Major depressive disorder, single episode, in partial remission: Secondary | ICD-10-CM | POA: Diagnosis not present

## 2022-05-18 NOTE — Telephone Encounter (Signed)
Called patient to schedule Medicare Annual Wellness Visit (AWV). Unable to reach patient. LVM informing patient that I had to re-schedule her appt from 07/07/2022 to 07/12/2022. Due to no NHA in the office.  Last date of AWV: 07/06/2022  Please schedule an appointment at any time with nha.  If any questions, please contact me at [phone].  Thank you ,  [place] Direct Dial: [phone]

## 2022-06-02 DIAGNOSIS — F209 Schizophrenia, unspecified: Secondary | ICD-10-CM | POA: Diagnosis not present

## 2022-06-02 DIAGNOSIS — F324 Major depressive disorder, single episode, in partial remission: Secondary | ICD-10-CM | POA: Diagnosis not present

## 2022-06-06 DIAGNOSIS — Z85850 Personal history of malignant neoplasm of thyroid: Secondary | ICD-10-CM | POA: Diagnosis not present

## 2022-06-06 DIAGNOSIS — E890 Postprocedural hypothyroidism: Secondary | ICD-10-CM | POA: Diagnosis not present

## 2022-06-13 DIAGNOSIS — Z85850 Personal history of malignant neoplasm of thyroid: Secondary | ICD-10-CM | POA: Diagnosis not present

## 2022-06-13 DIAGNOSIS — R87810 Cervical high risk human papillomavirus (HPV) DNA test positive: Secondary | ICD-10-CM | POA: Insufficient documentation

## 2022-06-13 DIAGNOSIS — E890 Postprocedural hypothyroidism: Secondary | ICD-10-CM | POA: Diagnosis not present

## 2022-06-13 NOTE — Progress Notes (Unsigned)
PCP: Gwyneth Sprout, FNP   No chief complaint on file.   HPI:      Diamond Lucas is a 62 y.o. G0P0000 whose LMP was Patient's last menstrual period was 03/29/2015., presents today for her annual examination.  Her menses are {norm/abn:715}, lasting {number: 22536} days.  Dysmenorrhea {dysmen:716}. She {does:18564} have intermenstrual bleeding. She {does:18564} have vasomotor sx.   Sex activity: {sex active: 315163}. She {does:18564} have vaginal dryness.  Last Pap: 06/08/21 Results were: no abnormalities /POS HPV DNA 2021 Hx of STDs: {STD hx:14358}  Last mammogram: {date:304500300}  Results were: {norm/abn:13465} There is no FH of breast cancer. There is no FH of ovarian cancer. The patient {does:18564} do self-breast exams.  Colonoscopy: 11/19 at [facility] with polyps; Repeat due after 5 years.   Tobacco use: {tob:20664} Alcohol use: {Alcohol:11675} No drug use Exercise: {exercise:31265}  She {does:18564} get adequate calcium and Vitamin D in her diet.  Labs with PCP.   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) 04/28/2021   Acute intractable tension-type headache 04/28/2021   Cervical radiculopathy 03/09/2021   Eczema of face 12/29/2020   Fatigue 12/16/2020   Concern about unwanted pregnancy without diagnosis 12/16/2020   Neck pain 12/10/2020   Varicose veins with pain 04/23/2018   Encounter for screening colonoscopy    Polyp of sigmoid colon    Benign neoplasm of cecum    Hypertrophy of anal papillae    Chronic venous insufficiency 10/07/2017   Lymphedema 10/07/2017   Delusion of infestation ([place]) 09/15/2016   Postoperative hypothyroidism 05/17/2016   Bipolar I disorder, most recent episode manic, severe with psychotic features ([place]) 02/17/2016   Delusional disorder, somatic type  ([place]) 10/07/2015   HTN (hypertension) 10/07/2015   Noncompliance 10/06/2015   Rosacea 02/09/2015   Rhinitis, allergic 12/26/2014   Abnormal urine findings 09/05/2014   S/P thyroidectomy 05/23/2014   Health care maintenance 04/03/2014   Pain of right thigh 10/30/2013   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   Stress 07/30/2013   Chest pain 07/10/2013   Palpitations 07/10/2013   SOB (shortness of breath) 07/10/2013   Trichorrhexis 05/28/2013   Abnormal facial hair 05/28/2013   Sinus congestion 05/28/2013   Reflux 09/26/2012   Immunization counseling 09/26/2012   Acute upper respiratory infection 06/22/2012   Hypothyroidism, unspecified 03/13/2012   Hirsutism 04/11/2011   Abdominal pain 04/11/2011   Otalgia 04/11/2011    Past Surgical History:  Procedure Laterality Date   COLONOSCOPY WITH PROPOFOL N/A 02/08/2018   Procedure: COLONOSCOPY WITH PROPOFOL;  Surgeon: Virgel Manifold, MD;  Location: [facility] ENDOSCOPY;  Service: Endoscopy;  Laterality: N/A;   FOOT SURGERY Right    THYROIDECTOMY      Family History  Problem Relation Age of Onset   Depression Mother    Cataracts Mother    Stroke Mother        mini   Cancer Other    Heart failure Other    Heart disease Other    Stroke Other    Diabetes Other    Kidney cancer Father    Anxiety disorder Brother     Social History   Socioeconomic History   Marital status: Single    Spouse name: Not on file   Number of children: 0   Years of education: Not on file   Highest education  level: Bachelor's degree (e.g., BA, AB, BS)  Occupational History   Occupation: retired  Tobacco Use   Smoking status: Never   Smokeless tobacco: Never  Vaping Use   Vaping Use: Never used  Substance and Sexual Activity   Alcohol use: No   Drug use: No   Sexual activity: Not Currently    Birth control/protection: Post-menopausal, Abstinence  Other Topics Concern   Not on file  Social History Narrative   Not on file   Social  Determinants of Health   Financial Resource Strain: Low Risk  (07/05/2021)   Overall Financial Resource Strain (CARDIA)    Difficulty of Paying Living Expenses: Not very hard  Food Insecurity: No Food Insecurity (07/05/2021)   Hunger Vital Sign    Worried About Running Out of Food in the Last Year: Never true    Ran Out of Food in the Last Year: Never true  Transportation Needs: No Transportation Needs (07/05/2021)   PRAPARE - Hydrologist (Medical): No    Lack of Transportation (Non-Medical): No  Physical Activity: Insufficiently Active (07/05/2021)   Exercise Vital Sign    Days of Exercise per Week: 2 days    Minutes of Exercise per Session: 20 min  Stress: Stress Concern Present (07/05/2021)   [place]    Feeling of Stress : To some extent  Social Connections: Moderately Isolated (07/05/2021)   Social Connection and Isolation Panel [NHANES]    Frequency of Communication with Friends and Family: More than three times a week    Frequency of Social Gatherings with Friends and Family: Once a week    Attends Religious Services: More than 4 times per year    Active Member of [employer] or Organizations: No    Attends Archivist Meetings: Never    Marital Status: Divorced  Human resources officer Violence: Not At Risk (07/05/2021)   Humiliation, Afraid, Rape, and Kick questionnaire    Fear of Current or Ex-Partner: No    Emotionally Abused: No    Physically Abused: No    Sexually Abused: No     Current Outpatient Medications:    azelastine (ASTELIN) 0.1 % nasal spray, Place 1 spray into both nostrils 2 (two) times daily. Use in each nostril as directed, Disp: 30 mL, Rfl: 0   benzonatate (TESSALON) 100 MG capsule, Take 1 capsule (100 mg total) by mouth 2 (two) times daily as needed for cough., Disp: 20 capsule, Rfl: 0   Calcium Carbonate-Vit D-Min (CALCIUM 600+D PLUS MINERALS) 600-400 MG-UNIT  TABS, Take by mouth daily. , Disp: , Rfl:    Carboxymeth-Glyc-Polysorb PF 0.5-1-0.5 % SOLN, Apply 2 drops to eye 2 (two) times daily., Disp: , Rfl:    cyclobenzaprine (FLEXERIL) 5 MG tablet, Take 1 tablet (5 mg total) by mouth daily as needed for muscle spasms., Disp: 90 tablet, Rfl: 1   Ibuprofen (MOTRIN PO), Take by mouth as needed (liquid)., Disp: , Rfl:    Levothyroxine Sodium 50 MCG CAPS, Take by mouth., Disp: , Rfl:    Levothyroxine Sodium 50 MCG CAPS, Take by mouth., Disp: , Rfl:    liothyronine (CYTOMEL) 5 MCG tablet, Take 1 tablet (5 mcg total) by mouth daily., Disp: , Rfl:    Multiple Vitamin (MULTI-VITAMINS) TABS, daily. , Disp: , Rfl:    Omega-3 Fatty Acids (FISH OIL) 1000 MG CAPS, Take by mouth. Takes occasionally due to causing acid reflux, Disp: , Rfl:  risperiDONE (RISPERDAL) 1 MG tablet, Take 1 mg by mouth at bedtime., Disp: , Rfl:    triamcinolone (NASACORT) 55 MCG/ACT AERO nasal inhaler, Place 2 sprays into the nose as needed. Once daily as needed., Disp: , Rfl:    UNABLE TO FIND, Take 1 capsule by mouth 2 (two) times daily. Med Name: VENEASE, Disp: , Rfl:      ROS:  Review of Systems BREAST: No symptoms    Objective: LMP 03/29/2015    OBGyn Exam  Results: No results found for this or any previous visit (from the past 24 hour(s)).  Assessment/Plan:  No diagnosis found.   No orders of the defined types were placed in this encounter.           GYN counsel {counseling: 16159}    F/U  No follow-ups on file.  Sulay Brymer B. Cloee Dunwoody, PA-C 06/13/2022 1:53 PM

## 2022-06-14 ENCOUNTER — Ambulatory Visit (INDEPENDENT_AMBULATORY_CARE_PROVIDER_SITE_OTHER): Payer: Medicare Other | Admitting: Obstetrics and Gynecology

## 2022-06-14 ENCOUNTER — Encounter: Payer: Self-pay | Admitting: Obstetrics and Gynecology

## 2022-06-14 ENCOUNTER — Other Ambulatory Visit (HOSPITAL_COMMUNITY)
Admission: RE | Admit: 2022-06-14 | Discharge: 2022-06-14 | Disposition: A | Discharge status: Home / Self Care | Payer: Medicare Other | Source: Ambulatory Visit | Attending: Obstetrics and Gynecology | Admitting: Obstetrics and Gynecology

## 2022-06-14 VITALS — BP 104/62 | Ht 61.0 in | Wt 132.0 lb

## 2022-06-14 DIAGNOSIS — R87810 Cervical high risk human papillomavirus (HPV) DNA test positive: Secondary | ICD-10-CM | POA: Diagnosis not present

## 2022-06-14 DIAGNOSIS — Z01419 Encounter for gynecological examination (general) (routine) without abnormal findings: Secondary | ICD-10-CM | POA: Diagnosis not present

## 2022-06-14 DIAGNOSIS — Z1231 Encounter for screening mammogram for malignant neoplasm of breast: Secondary | ICD-10-CM

## 2022-06-14 DIAGNOSIS — F324 Major depressive disorder, single episode, in partial remission: Secondary | ICD-10-CM | POA: Diagnosis not present

## 2022-06-14 DIAGNOSIS — F209 Schizophrenia, unspecified: Secondary | ICD-10-CM | POA: Diagnosis not present

## 2022-06-14 DIAGNOSIS — Z124 Encounter for screening for malignant neoplasm of cervix: Secondary | ICD-10-CM | POA: Insufficient documentation

## 2022-06-14 DIAGNOSIS — Z1151 Encounter for screening for human papillomavirus (HPV): Secondary | ICD-10-CM | POA: Insufficient documentation

## 2022-06-14 NOTE — Patient Instructions (Signed)
I value your feedback and you entrusting us with your care. If you get a [facility] patient survey, I would appreciate you taking the time to let us know about your experience today. Thank you! ? ? ?

## 2022-06-15 ENCOUNTER — Encounter: Payer: Self-pay | Admitting: Family Medicine

## 2022-06-16 ENCOUNTER — Ambulatory Visit: Payer: Self-pay

## 2022-06-16 NOTE — Progress Notes (Signed)
Established patient visit  Patient: Diamond Lucas   DOB: [birth year]   62 y.o. Female  MRN: [id number] Visit Date: 06/17/2022  Today's healthcare provider: Gwyneth Sprout, FNP  Re Introduced to nurse practitioner role and practice setting.  All questions answered.  Discussed provider/patient relationship and expectations.  Subjective    HPI HPI   neck discomfort--pt stated heard something pop of the  left side neck pain especially when swallowing, moving--since March 14. Taking Tylenol 500 mg for pain. Last edited by Elta Guadeloupe, CMA on 06/17/2022  1:47 PM.       Thyroid tenderness anterior, cervical tenderness. Unable to palpate; following my exam, Dr Quentin Cornwall performed an exam and was also unable to note cervical edema at site of patient reported tenderness.  Medications:  Outpatient Medications Prior to Visit  Medication Sig   CALCIUM CITRATE PO Take by mouth.   Carboxymeth-Glyc-Polysorb PF 0.5-1-0.5 % SOLN Apply 2 drops to eye 2 (two) times daily.   cyclobenzaprine (FLEXERIL) 5 MG tablet Take 1 tablet (5 mg total) by mouth daily as needed for muscle spasms.   Ibuprofen (MOTRIN PO) Take by mouth as needed (liquid).   Levothyroxine Sodium (TIROSINT) 50 MCG CAPS Take by mouth.   liothyronine (CYTOMEL) 5 MCG tablet Take 1 tablet (5 mcg total) by mouth daily.   Multiple Vitamin (MULTI-VITAMINS) TABS daily.    Omega-3 Fatty Acids (FISH OIL) 1000 MG CAPS Take by mouth. Takes occasionally due to causing acid reflux   risperiDONE (RISPERDAL) 1 MG tablet Take 1 mg by mouth at bedtime.   triamcinolone (NASACORT) 55 MCG/ACT AERO nasal inhaler Place 2 sprays into the nose as needed. Once daily as needed.   UNABLE TO FIND Take 1 capsule by mouth 2 (two) times daily. Med Name: VENEASE   No facility-administered medications prior to visit.    Review of Systems  Musculoskeletal:  Positive for neck pain. Negative for neck stiffness.     Objective    BP (!) 148/80 (BP Location:  Right Arm, Patient Position: Sitting, Cuff Size: Normal)   Pulse 70   Temp 98.2 F (36.8 C)   Ht 5\' 1"  (1.549 m)   Wt 133 lb (60.3 kg)   LMP 03/29/2015   SpO2 96%   BMI 25.13 kg/m   Physical Exam Vitals and nursing note reviewed.  Constitutional:      General: She is not in acute distress.    Appearance: Normal appearance. She is normal weight. She is not ill-appearing, toxic-appearing or diaphoretic.  HENT:     Head: Normocephalic and atraumatic.  Neck:      Comments: Normal neck, thyroid, and lymph node exam; completed by Quentin Cornwall, MD  Cardiovascular:     Rate and Rhythm: Normal rate and regular rhythm.     Pulses: Normal pulses.     Heart sounds: Normal heart sounds. No murmur heard.    No friction rub. No gallop.  Pulmonary:     Effort: Pulmonary effort is normal. No respiratory distress.     Breath sounds: Normal breath sounds. No stridor. No wheezing, rhonchi or rales.  Chest:     Chest wall: No tenderness.  Abdominal:     General: Bowel sounds are normal.     Palpations: Abdomen is soft.  Musculoskeletal:        General: No swelling, deformity or signs of injury. Normal range of motion.     Cervical back: Normal range of motion and neck supple. Tenderness present. No  rigidity.     Right lower leg: No edema.     Left lower leg: No edema.  Lymphadenopathy:     Cervical: No cervical adenopathy.  Skin:    General: Skin is warm and dry.     Capillary Refill: Capillary refill takes less than 2 seconds.     Coloration: Skin is not jaundiced or pale.     Findings: No bruising, erythema, lesion or rash.  Neurological:     General: No focal deficit present.     Mental Status: She is alert and oriented to person, place, and time. Mental status is at baseline.     Cranial Nerves: No cranial nerve deficit.     Sensory: No sensory deficit.     Motor: No weakness.     Coordination: Coordination normal.  Psychiatric:        Mood and Affect: Mood normal.        Behavior:  Behavior normal.        Thought Content: Thought content normal.        Judgment: Judgment normal.     No results found for any visits on 06/17/22.  Assessment & Plan     Problem List Items Addressed This Visit       Endocrine   Postoperative hypothyroidism    Chronic, previously elevated at 11 Followed by [facility] to titrate meds under their judgement later this weekend        Immune and Lymphatic   Lymphadenopathy - Primary    Patient report; not seen on examination Defer stat referral for [place]; continue to keep [place] scheduled by endo this coming Tuesday Discussed reasons to seek emergent care through ED vs calling 9-1-1 if developed over the weekend; continue to use NSAIDs as previously discussed if in pain      Return if symptoms worsen or fail to improve.     Vonna Kotyk, FNP, have reviewed all documentation for this visit. The documentation on 06/17/22 for the exam, diagnosis, procedures, and orders are all accurate and complete.  Billing reflects consult with Eulis Foster, MD.   Gwyneth Sprout, [place] [phone] (phone) [phone] (fax)  [facility]

## 2022-06-16 NOTE — Telephone Encounter (Signed)
       Chief Complaint: Swollen lymph nodes Symptoms: Pain Frequency: 06/09/22 Pertinent Negatives: Patient denies fever Disposition: [] ED /[] Urgent Care (no appt availability in office) / [x] Appointment(In office/virtual)/ []  [facility]/ [] Home Care/ [] Refused Recommended Disposition /[] [facility]/ []  Follow-up with PCP Additional Notes: PEC agent made appointment.  Answer Assessment - Initial Assessment Questions 1. APPEARANCE of SWELLING: "What does it look like?"     Circular  2. SIZE: "How large is the swelling?" (e.g., inches, cm; or compare to size of pinhead, tip of pen, eraser, coin, pea, grape, ping pong ball)      Nickel size 3. LOCATION: "Where is the swelling located?"     Neck -= 8 4. ONSET: "When did the swelling start?"     06/09/22 5. COLOR: "What color is it?" "Is there more than one color?"     Skin tone 6. PAIN: "Is there any pain?" If Yes, ask: "How bad is the pain?" (e.g., scale 1-10; or mild, moderate, severe)     - NONE (0): no pain   - MILD (1-3): doesn't interfere with normal activities    - MODERATE (4-7): interferes with normal activities or awakens from sleep    - SEVERE (8-10): excruciating pain, unable to do any normal activities     Severe 7. ITCH: "Does it itch?" If Yes, ask: "How bad is the itch?"      No 8. CAUSE: "What do you think caused the swelling?" 9 OTHER SYMPTOMS: "Do you have any other symptoms?" (e.g., fever)     No  Protocols used: Skin Lump or Localized Swelling-A-AH

## 2022-06-17 ENCOUNTER — Encounter: Payer: Self-pay | Admitting: Family Medicine

## 2022-06-17 ENCOUNTER — Ambulatory Visit (INDEPENDENT_AMBULATORY_CARE_PROVIDER_SITE_OTHER): Payer: Medicare Other | Admitting: Family Medicine

## 2022-06-17 VITALS — BP 148/80 | HR 70 | Temp 98.2°F | Ht 61.0 in | Wt 133.0 lb

## 2022-06-17 DIAGNOSIS — E890 Postprocedural hypothyroidism: Secondary | ICD-10-CM

## 2022-06-17 DIAGNOSIS — R591 Generalized enlarged lymph nodes: Secondary | ICD-10-CM

## 2022-06-17 LAB — CYTOLOGY - PAP
Adequacy: ABSENT
Comment: NEGATIVE
Diagnosis: NEGATIVE
High risk HPV: NEGATIVE

## 2022-06-17 NOTE — Assessment & Plan Note (Signed)
Chronic, previously elevated at 11 Followed by [facility] to titrate meds under their judgement later this weekend

## 2022-06-17 NOTE — Assessment & Plan Note (Signed)
Patient report; not seen on examination Defer stat referral for [place]; continue to keep [place] scheduled by endo this coming Tuesday Discussed reasons to seek emergent care through ED vs calling 9-1-1 if developed over the weekend; continue to use NSAIDs as previously discussed if in pain

## 2022-06-21 DIAGNOSIS — E890 Postprocedural hypothyroidism: Secondary | ICD-10-CM | POA: Diagnosis not present

## 2022-06-21 DIAGNOSIS — Z85850 Personal history of malignant neoplasm of thyroid: Secondary | ICD-10-CM | POA: Diagnosis not present

## 2022-06-30 DIAGNOSIS — F209 Schizophrenia, unspecified: Secondary | ICD-10-CM | POA: Diagnosis not present

## 2022-06-30 DIAGNOSIS — F324 Major depressive disorder, single episode, in partial remission: Secondary | ICD-10-CM | POA: Diagnosis not present

## 2022-07-13 ENCOUNTER — Telehealth: Payer: Self-pay | Admitting: Family Medicine

## 2022-07-13 NOTE — Telephone Encounter (Signed)
Pt would like to reschedule her AWV

## 2022-07-14 ENCOUNTER — Telehealth: Payer: Self-pay | Admitting: Family Medicine

## 2022-07-14 NOTE — Telephone Encounter (Signed)
Called patient to schedule Medicare Annual Wellness Visit (AWV). Left message for patient to call back and schedule Medicare Annual Wellness Visit (AWV).  Last date of AWV: 07/05/2021  Please schedule an appointment at any time with [facility].  If any questions, please contact me at [phone].  Thank you ,  Bernice Cicero Care Guide [facility] AWV TEAM Direct Dial: [phone]   

## 2022-07-18 ENCOUNTER — Telehealth: Payer: Self-pay | Admitting: Family Medicine

## 2022-07-18 DIAGNOSIS — F324 Major depressive disorder, single episode, in partial remission: Secondary | ICD-10-CM | POA: Diagnosis not present

## 2022-07-18 DIAGNOSIS — F209 Schizophrenia, unspecified: Secondary | ICD-10-CM | POA: Diagnosis not present

## 2022-07-18 NOTE — Telephone Encounter (Signed)
Called patient to schedule Medicare Annual Wellness Visit (AWV). Left message for patient to call back and schedule Medicare Annual Wellness Visit (AWV).  Last date of AWV: 07/05/2021   Please schedule an AWVS appointment at any time with [facility] ANNUAL WELLNESS VISIT.  If any questions, please contact me at [phone].    Thank you,  [facility] Support [facility] Direct dial  [phone]

## 2022-07-21 ENCOUNTER — Telehealth: Payer: Self-pay | Admitting: Family Medicine

## 2022-07-21 NOTE — Telephone Encounter (Signed)
Copied from CRM [id number]. Topic: Medicare AWV >> Jul 21, 2022  9:24 AM Rushie Goltz wrote: Reason for CRM: Called patient to schedule Medicare Annual Wellness Visit (AWV). Left message for patient to call back and schedule Medicare Annual Wellness Visit (AWV).  Last date of AWV: 07/05/2021  Please schedule an AWVS appointment at any time with [facility] ANNUAL WELLNESS VISIT.  If any questions, please contact me at [phone].   Thank you,  [facility] Support [facility] Direct dial  [phone]

## 2022-07-25 ENCOUNTER — Telehealth: Payer: Self-pay | Admitting: Family Medicine

## 2022-07-25 NOTE — Telephone Encounter (Signed)
Contacted Diamond Lucas to schedule their annual wellness visit. Appointment made for 09/05/2022.  Thank you,  [facility] Support [facility] Direct dial  [phone]

## 2022-07-31 ENCOUNTER — Encounter: Payer: Self-pay | Admitting: Obstetrics and Gynecology

## 2022-08-01 DIAGNOSIS — F324 Major depressive disorder, single episode, in partial remission: Secondary | ICD-10-CM | POA: Diagnosis not present

## 2022-08-01 DIAGNOSIS — F209 Schizophrenia, unspecified: Secondary | ICD-10-CM | POA: Diagnosis not present

## 2022-08-08 DIAGNOSIS — E890 Postprocedural hypothyroidism: Secondary | ICD-10-CM | POA: Diagnosis not present

## 2022-08-17 ENCOUNTER — Other Ambulatory Visit: Payer: Self-pay | Admitting: Family Medicine

## 2022-08-17 DIAGNOSIS — Z79899 Other long term (current) drug therapy: Secondary | ICD-10-CM | POA: Diagnosis not present

## 2022-08-17 LAB — CBC AND DIFFERENTIAL
HCT: 44 (ref 36–46)
Hemoglobin: 14.4 (ref 12.0–16.0)
Neutrophils Absolute: 4.2
Platelets: 250 10*3/uL (ref 150–400)
WBC: 7.2

## 2022-08-17 LAB — LIPID PANEL
Cholesterol: 229 — AB (ref 0–200)
HDL: 57 (ref 35–70)
LDL Cholesterol: 146
Triglycerides: 144 (ref 40–160)

## 2022-08-17 LAB — CBC: RBC: 4.64 (ref 3.87–5.11)

## 2022-08-17 LAB — HEPATIC FUNCTION PANEL
ALT: 23 U/L (ref 7–35)
AST: 19 (ref 13–35)

## 2022-08-17 LAB — BASIC METABOLIC PANEL
BUN: 23 — AB (ref 4–21)
CO2: 26 — AB (ref 13–22)
Chloride: 100 (ref 99–108)
Creatinine: 0.8 (ref 0.5–1.1)
Glucose: 98
Potassium: 4.7 mEq/L (ref 3.5–5.1)
Sodium: 138 (ref 137–147)

## 2022-08-17 LAB — COMPREHENSIVE METABOLIC PANEL
Calcium: 9.3 (ref 8.7–10.7)
eGFR: 82

## 2022-08-17 LAB — TSH: TSH: 4.78 (ref 0.41–5.90)

## 2022-08-17 LAB — HEMOGLOBIN A1C: Hemoglobin A1C: 5.4

## 2022-08-18 ENCOUNTER — Encounter: Payer: Self-pay | Admitting: Physician Assistant

## 2022-08-25 ENCOUNTER — Encounter: Payer: Self-pay | Admitting: Family Medicine

## 2022-08-28 ENCOUNTER — Encounter: Payer: Self-pay | Admitting: Obstetrics and Gynecology

## 2022-09-05 ENCOUNTER — Encounter: Payer: Self-pay | Admitting: Family Medicine

## 2022-09-05 ENCOUNTER — Telehealth: Payer: Self-pay

## 2022-09-05 ENCOUNTER — Ambulatory Visit (INDEPENDENT_AMBULATORY_CARE_PROVIDER_SITE_OTHER): Payer: Medicare Other

## 2022-09-05 VITALS — Ht 61.0 in | Wt 133.0 lb

## 2022-09-05 DIAGNOSIS — Z0000 Encounter for general adult medical examination without abnormal findings: Secondary | ICD-10-CM

## 2022-09-05 NOTE — Patient Instructions (Signed)
Ms. Winecoff , Thank you for taking time to come for your Medicare Wellness Visit. I appreciate your ongoing commitment to your health goals. Please review the following plan we discussed and let me know if I can assist you in the future.   These are the goals we discussed:  Goals      DIET - EAT MORE FRUITS AND VEGETABLES     DIET - INCREASE WATER INTAKE     Recommend increasing water intake to 6 glasses a day.      Exercise      Recommend more exercise. Pt to start back going to the gym 3 days a week for an hour.         This is a list of the screening recommended for you and due dates:  Health Maintenance  Topic Date Due   Mammogram  Never done   COVID-19 Vaccine (5 - 2023-24 season) 11/26/2021   Colon Cancer Screening  02/09/2023   Medicare Annual Wellness Visit  09/05/2023   Pap Smear  06/14/2027   DTaP/Tdap/Td vaccine (2 - Tdap) 07/21/2027   Hepatitis C Screening  Completed   HIV Screening  Completed   Zoster (Shingles) Vaccine  Completed   HPV Vaccine  Aged Out   Flu Shot  Discontinued    Advanced directives: yes  Conditions/risks identified: low falls risk  Next appointment: Follow up in one year for your annual wellness visit. 09/06/2023 @10 :45am telephone  Preventive Care 40-64 Years, Female Preventive care refers to lifestyle choices and visits with your health care provider that can promote health and wellness. What does preventive care include? A yearly physical exam. This is also called an annual well check. Dental exams once or twice a year. Routine eye exams. Ask your health care provider how often you should have your eyes checked. Personal lifestyle choices, including: Daily care of your teeth and gums. Regular physical activity. Eating a healthy diet. Avoiding tobacco and drug use. Limiting alcohol use. Practicing safe sex. Taking low-dose aspirin daily starting at age 27. Taking vitamin and mineral supplements as recommended by your health care  provider. What happens during an annual well check? The services and screenings done by your health care provider during your annual well check will depend on your age, overall health, lifestyle risk factors, and family history of disease. Counseling  Your health care provider may ask you questions about your: Alcohol use. Tobacco use. Drug use. Emotional well-being. Home and relationship well-being. Sexual activity. Eating habits. Work and work Astronomer. Method of birth control. Menstrual cycle. Pregnancy history. Screening  You may have the following tests or measurements: Height, weight, and BMI. Blood pressure. Lipid and cholesterol levels. These may be checked every 5 years, or more frequently if you are over 94 years old. Skin check. Lung cancer screening. You may have this screening every year starting at age 49 if you have a 30-pack-year history of smoking and currently smoke or have quit within the past 15 years. Fecal occult blood test (FOBT) of the stool. You may have this test every year starting at age 55. Flexible sigmoidoscopy or colonoscopy. You may have a sigmoidoscopy every 5 years or a colonoscopy every 10 years starting at age 33. Hepatitis C blood test. Hepatitis B blood test. Sexually transmitted disease (STD) testing. Diabetes screening. This is done by checking your blood sugar (glucose) after you have not eaten for a while (fasting). You may have this done every 1-3 years. Mammogram. This may be done  every 1-2 years. Talk to your health care provider about when you should start having regular mammograms. This may depend on whether you have a family history of breast cancer. BRCA-related cancer screening. This may be done if you have a family history of breast, ovarian, tubal, or peritoneal cancers. Pelvic exam and Pap test. This may be done every 3 years starting at age 21. Starting at age 30, this may be done every 5 years if you have a Pap test in  combination with an HPV test. Bone density scan. This is done to screen for osteoporosis. You may have this scan if you are at high risk for osteoporosis. Discuss your test results, treatment options, and if necessary, the need for more tests with your health care provider. Vaccines  Your health care provider may recommend certain vaccines, such as: Influenza vaccine. This is recommended every year. Tetanus, diphtheria, and acellular pertussis (Tdap, Td) vaccine. You may need a Td booster every 10 years. Zoster vaccine. You may need this after age 60. Pneumococcal 13-valent conjugate (PCV13) vaccine. You may need this if you have certain conditions and were not previously vaccinated. Pneumococcal polysaccharide (PPSV23) vaccine. You may need one or two doses if you smoke cigarettes or if you have certain conditions. Talk to your health care provider about which screenings and vaccines you need and how often you need them. This information is not intended to replace advice given to you by your health care provider. Make sure you discuss any questions you have with your health care provider. Document Released: 04/10/2015 Document Revised: 12/02/2015 Document Reviewed: 01/13/2015 Elsevier Interactive Patient Education  2017 Elsevier Inc.    Fall Prevention in the Home Falls can cause injuries. They can happen to people of all ages. There are many things you can do to make your home safe and to help prevent falls. What can I do on the outside of my home? Regularly fix the edges of walkways and driveways and fix any cracks. Remove anything that might make you trip as you walk through a door, such as a raised step or threshold. Trim any bushes or trees on the path to your home. Use bright outdoor lighting. Clear any walking paths of anything that might make someone trip, such as rocks or tools. Regularly check to see if handrails are loose or broken. Make sure that both sides of any steps have  handrails. Any raised decks and porches should have guardrails on the edges. Have any leaves, snow, or ice cleared regularly. Use sand or salt on walking paths during winter. Clean up any spills in your garage right away. This includes oil or grease spills. What can I do in the bathroom? Use night lights. Install grab bars by the toilet and in the tub and shower. Do not use towel bars as grab bars. Use non-skid mats or decals in the tub or shower. If you need to sit down in the shower, use a plastic, non-slip stool. Keep the floor dry. Clean up any water that spills on the floor as soon as it happens. Remove soap buildup in the tub or shower regularly. Attach bath mats securely with double-sided non-slip rug tape. Do not have throw rugs and other things on the floor that can make you trip. What can I do in the bedroom? Use night lights. Make sure that you have a light by your bed that is easy to reach. Do not use any sheets or blankets that are too big for your   bed. They should not hang down onto the floor. Have a firm chair that has side arms. You can use this for support while you get dressed. Do not have throw rugs and other things on the floor that can make you trip. What can I do in the kitchen? Clean up any spills right away. Avoid walking on wet floors. Keep items that you use a lot in easy-to-reach places. If you need to reach something above you, use a strong step stool that has a grab bar. Keep electrical cords out of the way. Do not use floor polish or wax that makes floors slippery. If you must use wax, use non-skid floor wax. Do not have throw rugs and other things on the floor that can make you trip. What can I do with my stairs? Do not leave any items on the stairs. Make sure that there are handrails on both sides of the stairs and use them. Fix handrails that are broken or loose. Make sure that handrails are as long as the stairways. Check any carpeting to make sure  that it is firmly attached to the stairs. Fix any carpet that is loose or worn. Avoid having throw rugs at the top or bottom of the stairs. If you do have throw rugs, attach them to the floor with carpet tape. Make sure that you have a light switch at the top of the stairs and the bottom of the stairs. If you do not have them, ask someone to add them for you. What else can I do to help prevent falls? Wear shoes that: Do not have high heels. Have rubber bottoms. Are comfortable and fit you well. Are closed at the toe. Do not wear sandals. If you use a stepladder: Make sure that it is fully opened. Do not climb a closed stepladder. Make sure that both sides of the stepladder are locked into place. Ask someone to hold it for you, if possible. Clearly mark and make sure that you can see: Any grab bars or handrails. First and last steps. Where the edge of each step is. Use tools that help you move around (mobility aids) if they are needed. These include: Canes. Walkers. Scooters. Crutches. Turn on the lights when you go into a dark area. Replace any light bulbs as soon as they burn out. Set up your furniture so you have a clear path. Avoid moving your furniture around. If any of your floors are uneven, fix them. If there are any pets around you, be aware of where they are. Review your medicines with your doctor. Some medicines can make you feel dizzy. This can increase your chance of falling. Ask your doctor what other things that you can do to help prevent falls. This information is not intended to replace advice given to you by your health care provider. Make sure you discuss any questions you have with your health care provider. Document Released: 01/08/2009 Document Revised: 08/20/2015 Document Reviewed: 04/18/2014 Elsevier Interactive Patient Education  2017 [employer].

## 2022-09-05 NOTE — Progress Notes (Signed)
I connected with  Valentino Hue Skellenger on 09/05/22 by a audio enabled telemedicine application and verified that I am speaking with the correct person using two identifiers.  Patient Location: Home  Provider Location: Home Office  I discussed the limitations of evaluation and management by telemedicine. The patient expressed understanding and agreed to proceed.  Subjective:   Legacy Carrender is a 62 y.o. female who presents for Medicare Annual (Subsequent) preventive examination.  Review of Systems    Cardiac Risk Factors include: hypertension    Objective:    Today's Vitals   09/05/22 1107  Weight: 133 lb (60.3 kg)  Height: 5\' 1"  (1.549 m)   Body mass index is 25.13 kg/m.     09/05/2022   11:23 AM 07/05/2021   11:13 AM 08/12/2019    1:46 PM 02/08/2018    8:13 AM 11/01/2017   12:11 AM 07/12/2017    2:44 PM 01/17/2017   11:27 AM  Advanced Directives  Does Patient Have a Medical Advance Directive? Yes No Yes No  No Yes  Type of [place] boy of [place];Living will      Copy of Healthcare Power of Attorney in Chart?   Yes - validated most recent copy scanned in chart (See row information)      Would patient like information on creating a medical advance directive?  No - Patient declined    No - Patient declined      Information is confidential and restricted. Go to Review Flowsheets to unlock data.    Current Medications (verified) Outpatient Encounter Medications as of 09/05/2022  Medication Sig   Ibuprofen (MOTRIN PO) Take by mouth as needed (liquid).   Levothyroxine Sodium (TIROSINT) 50 MCG CAPS Take by mouth.   liothyronine (CYTOMEL) 5 MCG tablet Take 1 tablet (5 mcg total) by mouth daily.   Multiple Vitamin (MULTI-VITAMINS) TABS daily.    Omega-3 Fatty Acids (FISH OIL) 1000 MG CAPS Take by mouth. Takes occasionally due to causing acid reflux   risperiDONE (RISPERDAL) 1 MG tablet Take 1 mg by mouth at bedtime.   triamcinolone (NASACORT) 55  MCG/ACT AERO nasal inhaler Place 2 sprays into the nose as needed. Once daily as needed.   CALCIUM CITRATE PO Take by mouth.   Carboxymeth-Glyc-Polysorb PF 0.5-1-0.5 % SOLN Apply 2 drops to eye 2 (two) times daily.   cyclobenzaprine (FLEXERIL) 5 MG tablet Take 1 tablet (5 mg total) by mouth daily as needed for muscle spasms.   UNABLE TO FIND Take 1 capsule by mouth 2 (two) times daily. Med Name: VENEASE   No facility-administered encounter medications on file as of 09/05/2022.    Allergies (verified) Diclofenac sodium, Naproxen, Olanzapine, Gluten meal, Mite (d. farinae), Nsaids, Other, and Synthroid [levothyroxine]   History: Past Medical History:  Diagnosis Date   Allergy    Arrhythmia    Cervical radiculopathy 03/09/2021   History of chicken pox    History of measles    History of syncope    Hypertension    Osteoarthritis    Palpitations    Pure hypercholesterolemia    Rosacea    Schizophrenia (HCC)    Thyroid disease    Past Surgical History:  Procedure Laterality Date   COLONOSCOPY WITH PROPOFOL N/A 02/08/2018   Procedure: COLONOSCOPY WITH PROPOFOL;  Surgeon: Pasty Spillers, MD;  Location: [facility] ENDOSCOPY;  Service: Endoscopy;  Laterality: N/A;   FOOT SURGERY Right    THYROIDECTOMY     Family History  Problem  Relation Age of Onset   Depression Mother    Cataracts Mother    Stroke Mother        mini   Kidney cancer Father    Anxiety disorder Brother    Cancer Other        unknown cancer type   Heart failure Other    Heart disease Other    Stroke Other    Diabetes Other    Social History   Socioeconomic History   Marital status: Single    Spouse name: Not on file   Number of children: 0   Years of education: Not on file   Highest education level: Bachelor's degree (e.g., BA, AB, BS)  Occupational History   Occupation: retired  Tobacco Use   Smoking status: Never   Smokeless tobacco: Never  Vaping Use   Vaping Use: Never used  Substance and  Sexual Activity   Alcohol use: No   Drug use: No   Sexual activity: Not Currently    Birth control/protection: Post-menopausal, Abstinence  Other Topics Concern   Not on file  Social History Narrative   Not on file   Social Determinants of Health   Financial Resource Strain: Low Risk  (09/05/2022)   Overall Financial Resource Strain (CARDIA)    Difficulty of Paying Living Expenses: Not very hard  Food Insecurity: No Food Insecurity (09/05/2022)   Hunger Vital Sign    Worried About Running Out of Food in the Last Year: Never true    Ran Out of Food in the Last Year: Never true  Transportation Needs: No Transportation Needs (09/05/2022)   PRAPARE - Administrator, Civil Service (Medical): No    Lack of Transportation (Non-Medical): No  Physical Activity: Insufficiently Active (09/05/2022)   Exercise Vital Sign    Days of Exercise per Week: 2 days    Minutes of Exercise per Session: 20 min  Stress: Stress Concern Present (09/05/2022)   [employer] of Occupational Health - Occupational Stress Questionnaire    Feeling of Stress : To some extent  Social Connections: Moderately Integrated (09/05/2022)   Social Connection and Isolation Panel [NHANES]    Frequency of Communication with Friends and Family: Three times a week    Frequency of Social Gatherings with Friends and Family: Once a week    Attends Religious Services: More than 4 times per year    Active Member of [employer] or Organizations: Yes    Attends Banker Meetings: Never    Marital Status: Divorced    Tobacco Counseling Counseling given: Not Answered   Clinical Intake:  Pre-visit preparation completed: Yes  Pain : No/denies pain   BMI - recorded: 25.13 Nutritional Status: BMI 25 -29 Overweight Nutritional Risks: Nausea/ vomitting/ diarrhea (regurgitate when lifting) Diabetes: No  How often do you need to have someone help you when you read instructions, pamphlets, or other written  materials from your doctor or pharmacy?: 1 - Never  Diabetic?no  Interpreter Needed?: No  Comments: lives alone;not 100% safe, worries about vandalism Information entered by :: B.Shawnette Augello,LPN   Activities of Daily Living    09/05/2022   11:26 AM 06/17/2022    1:44 PM  In your present state of health, do you have any difficulty performing the following activities:  Hearing? 0 0  Vision? 0 1  Difficulty concentrating or making decisions? 1 1  Walking or climbing stairs? 1 0  Dressing or bathing? 0 0  Doing errands, shopping? 0 0  Preparing Food and eating ? N   Using the Toilet? N   In the past six months, have you accidently leaked urine? N   Do you have problems with loss of bowel control? N   Managing your Medications? N   Managing your Finances? N   Housekeeping or managing your Housekeeping? N     Patient Care Team: Jacky Kindle, FNP as PCP - General (Family Medicine) Debbe Odea, MD as PCP - Cardiology (Cardiology) Hildred Laser, MD as Referring Physician (Obstetrics and Gynecology) Galen Manila, MD as Referring Physician (Ophthalmology) Ward, Elenora Fender, MD as Referring Physician (Obstetrics and Gynecology) Oletta Darter, MD as Consulting Physician (Psychiatry) Felecia Jan, LCSW as Social Worker (Licensed Clinical Social Worker) Tedd Sias, Marlana Salvage, MD as Physician Assistant (Endocrinology)  Indicate any recent Medical Services you may have received from other than [facility] providers in the past year (date may be approximate).     Assessment:   This is a routine wellness examination for Shubuta.  Hearing/Vision screen Hearing Screening - Comments:: Adequate hearing Vision Screening - Comments:: Adequate vision:reading glasses [facility]  Dietary issues and exercise activities discussed: Current Exercise Habits: Home exercise routine, Type of exercise: walking;stretching;treadmill, Time (Minutes): 30, Frequency (Times/Week): 3, Weekly  Exercise (Minutes/Week): 90, Intensity: Mild, Exercise limited by: orthopedic condition(s)   Goals Addressed             This Visit's Progress    DIET - EAT MORE FRUITS AND VEGETABLES   On track      Depression Screen    09/05/2022   11:18 AM 06/17/2022    1:44 PM 12/08/2021    8:13 AM 07/05/2021   11:09 AM 06/08/2021    9:00 AM 12/16/2020    1:13 PM 08/12/2019    1:37 PM  PHQ 2/9 Scores  PHQ - 2 Score 2 5 2 2 4 3 1   PHQ- 9 Score 3 8 5 5 9 11      Fall Risk    09/05/2022   11:13 AM 06/17/2022    1:43 PM 12/08/2021    8:12 AM 07/05/2021   11:14 AM 06/08/2021    9:00 AM  Fall Risk   Falls in the past year? 0 0 0 0 0  Number falls in past yr: 0  0 0 0  Injury with Fall? 0 0 0 0 0  Risk for fall due to : No Fall Risks  No Fall Risks No Fall Risks   Follow up Education provided;Falls prevention discussed  Falls evaluation completed Falls evaluation completed     FALL RISK PREVENTION PERTAINING TO THE HOME:  Any stairs in or around the home? No  If so, are there any without handrails? No  Home free of loose throw rugs in walkways, pet beds, electrical cords, etc? Yes  Adequate lighting in your home to reduce risk of falls? Yes   ASSISTIVE DEVICES UTILIZED TO PREVENT FALLS:  Life alert? No  Use of a cane, walker or w/c? No  Grab bars in the bathroom? No  Shower chair or bench in shower? No  Elevated toilet seat or a handicapped toilet? No     Cognitive Function:    05/24/2019    8:18 AM 04/13/2017   11:03 AM  MMSE - Mini Mental State Exam  Orientation to time 5 5  Orientation to Place 5 5  Registration 3 3  Attention/ Calculation 3 5  Recall 0 3  Language- name 2 objects 1 2  Language-  repeat 1 1  Language- follow 3 step command 3 3  Language- read & follow direction 1 1  Write a sentence 1 1  Copy design 1 1  Total score 24 30        09/05/2022   11:30 AM 05/24/2019    8:15 AM 07/12/2017    2:49 PM 04/13/2017   11:00 AM 06/29/2016    2:00 PM  6CIT Screen   What Year? 0 points 0 points 0 points 0 points 0 points  What month? 0 points 0 points 0 points 0 points 0 points  What time? 0 points 0 points 0 points 0 points 0 points  Count back from 20 0 points 2 points 0 points 0 points 0 points  Months in reverse 0 points 2 points 0 points 0 points 0 points  Repeat phrase 0 points 2 points 0 points 0 points 0 points  Total Score 0 points 6 points 0 points 0 points 0 points    Immunizations Immunization History  Administered Date(s) Administered   Fluad Quad(high Dose 65+) 01/09/2020   HPV Quadrivalent 10/02/2012   Hepatitis A, Adult 09/07/2020   Hepb-cpg 09/07/2020   Influenza, Quadrivalent, Recombinant, Inj, Pf 02/02/2021   Influenza,inj,Quad PF,6+ Mos 08/20/2012, 01/17/2017, 01/22/2018, 12/04/2018, 12/08/2021   Influenza-Unspecified 07/11/2019   PFIZER Comirnaty(Gray Top)Covid-19 Tri-Sucrose Vaccine 06/20/2019, 07/11/2019   PFIZER(Purple Top)SARS-COV-2 Vaccination 06/20/2019, 07/11/2019   PPD Test 06/09/2014   Pneumococcal Polysaccharide-23 01/21/2019   Rabies, IM 03/04/2015   Td 07/20/2017   Tetanus 12/26/2005   Varicella 12/03/2014   Zoster Recombinat (Shingrix) 07/05/2021, 03/22/2022    TDAP status: Up to date  Flu Vaccine status: Up to date  Pneumococcal vaccine status: Up to date  Covid-19 vaccine status: Completed vaccines  Qualifies for Shingles Vaccine? Yes   Zostavax completed Yes   Shingrix Completed?: Yes  Screening Tests Health Maintenance  Topic Date Due   MAMMOGRAM  Never done   COVID-19 Vaccine (5 - 2023-24 season) 11/26/2021   Colonoscopy  02/09/2023   Medicare Annual Wellness (AWV)  09/05/2023   PAP SMEAR-Modifier  06/14/2027   DTaP/Tdap/Td (2 - Tdap) 07/21/2027   Hepatitis C Screening  Completed   HIV Screening  Completed   Zoster Vaccines- Shingrix  Completed   HPV VACCINES  Aged Out   INFLUENZA VACCINE  Discontinued    Health Maintenance  Health Maintenance Due  Topic Date Due   MAMMOGRAM   Never done   COVID-19 Vaccine (5 - 2023-24 season) 11/26/2021    Colorectal cancer screening: Type of screening: Colonoscopy. Completed yes. Repeat every 5 years Nov 2024  Mammogram status: Completed no. Repeat every year Scheduled for September  Lung Cancer Screening: (Low Dose CT Chest recommended if Age 41-80 years, 30 pack-year currently smoking OR have quit w/in 15years.) does not qualify.   Lung Cancer Screening Referral: no  Additional Screening:  Hepatitis C Screening: does not qualify; Completed yes  Vision Screening: Recommended annual ophthalmology exams for early detection of glaucoma and other disorders of the eye. Is the patient up to date with their annual eye exam?  Yes  Who is the provider or what is the name of the office in which the patient attends annual eye exams? Dr Azucena Cecil If pt is not established with a provider, would they like to be referred to a provider to establish care? No .   Dental Screening: Recommended annual dental exams for proper oral hygiene  Community Resource Referral / Chronic Care Management: CRR required this  visit?  No   CCM required this visit?  No      Plan:     I have personally reviewed and noted the following in the patient's chart:   Medical and social history Use of alcohol, tobacco or illicit drugs  Current medications and supplements including opioid prescriptions. Patient is not currently taking opioid prescriptions. Functional ability and status Nutritional status Physical activity Advanced directives List of other physicians Hospitalizations, surgeries, and ER visits in previous 12 months Vitals Screenings to include cognitive, depression, and falls Referrals and appointments  In addition, I have reviewed and discussed with patient certain preventive protocols, quality metrics, and best practice recommendations. A written personalized care plan for preventive services as well as general preventive health  recommendations were provided to patient.     Sue Lush, LPN   [id number]/61/0960   Nurse Notes: The patient states she is doing fine and has no concerns or questions at this time. She does relay she needs to make a chiropractor appt to address some stiffness and back pain she has been having for awhile and has returned.  **Pt calls back and indicates she has some food strain (question amended).

## 2022-09-13 DIAGNOSIS — F209 Schizophrenia, unspecified: Secondary | ICD-10-CM | POA: Diagnosis not present

## 2022-09-13 DIAGNOSIS — F324 Major depressive disorder, single episode, in partial remission: Secondary | ICD-10-CM | POA: Diagnosis not present

## 2022-09-13 DIAGNOSIS — E890 Postprocedural hypothyroidism: Secondary | ICD-10-CM | POA: Diagnosis not present

## 2022-09-13 DIAGNOSIS — Z85850 Personal history of malignant neoplasm of thyroid: Secondary | ICD-10-CM | POA: Diagnosis not present

## 2022-09-14 NOTE — Telephone Encounter (Signed)
error 

## 2022-09-19 ENCOUNTER — Encounter: Payer: Self-pay | Admitting: Family Medicine

## 2022-09-19 DIAGNOSIS — L309 Dermatitis, unspecified: Secondary | ICD-10-CM

## 2022-09-22 NOTE — Telephone Encounter (Signed)
Referral to derm ok as requested.  Use eczema diagnosis

## 2022-10-03 ENCOUNTER — Encounter: Payer: Self-pay | Admitting: Obstetrics and Gynecology

## 2022-10-04 DIAGNOSIS — F324 Major depressive disorder, single episode, in partial remission: Secondary | ICD-10-CM | POA: Diagnosis not present

## 2022-10-04 DIAGNOSIS — F209 Schizophrenia, unspecified: Secondary | ICD-10-CM | POA: Diagnosis not present

## 2022-10-05 ENCOUNTER — Other Ambulatory Visit: Payer: Self-pay | Admitting: Family Medicine

## 2022-10-05 ENCOUNTER — Encounter: Payer: Self-pay | Admitting: Family Medicine

## 2022-10-05 DIAGNOSIS — L723 Sebaceous cyst: Secondary | ICD-10-CM

## 2022-10-06 ENCOUNTER — Encounter: Payer: Self-pay | Admitting: Family Medicine

## 2022-10-17 DIAGNOSIS — F324 Major depressive disorder, single episode, in partial remission: Secondary | ICD-10-CM | POA: Diagnosis not present

## 2022-10-17 DIAGNOSIS — F411 Generalized anxiety disorder: Secondary | ICD-10-CM | POA: Diagnosis not present

## 2022-10-17 DIAGNOSIS — F209 Schizophrenia, unspecified: Secondary | ICD-10-CM | POA: Diagnosis not present

## 2022-10-31 DIAGNOSIS — F411 Generalized anxiety disorder: Secondary | ICD-10-CM | POA: Diagnosis not present

## 2022-10-31 DIAGNOSIS — F324 Major depressive disorder, single episode, in partial remission: Secondary | ICD-10-CM | POA: Diagnosis not present

## 2022-10-31 DIAGNOSIS — F209 Schizophrenia, unspecified: Secondary | ICD-10-CM | POA: Diagnosis not present

## 2022-11-08 ENCOUNTER — Encounter: Payer: Self-pay | Admitting: Obstetrics and Gynecology

## 2022-11-14 DIAGNOSIS — F324 Major depressive disorder, single episode, in partial remission: Secondary | ICD-10-CM | POA: Diagnosis not present

## 2022-11-14 DIAGNOSIS — F209 Schizophrenia, unspecified: Secondary | ICD-10-CM | POA: Diagnosis not present

## 2022-11-14 DIAGNOSIS — F411 Generalized anxiety disorder: Secondary | ICD-10-CM | POA: Diagnosis not present

## 2022-12-05 DIAGNOSIS — F411 Generalized anxiety disorder: Secondary | ICD-10-CM | POA: Diagnosis not present

## 2022-12-05 DIAGNOSIS — F209 Schizophrenia, unspecified: Secondary | ICD-10-CM | POA: Diagnosis not present

## 2022-12-05 DIAGNOSIS — F324 Major depressive disorder, single episode, in partial remission: Secondary | ICD-10-CM | POA: Diagnosis not present

## 2022-12-06 ENCOUNTER — Ambulatory Visit
Admission: RE | Admit: 2022-12-06 | Discharge: 2022-12-06 | Disposition: A | Discharge status: Home / Self Care | Payer: Medicare Other | Source: Ambulatory Visit | Attending: Obstetrics and Gynecology | Admitting: Obstetrics and Gynecology

## 2022-12-06 DIAGNOSIS — Z1231 Encounter for screening mammogram for malignant neoplasm of breast: Secondary | ICD-10-CM

## 2022-12-07 ENCOUNTER — Other Ambulatory Visit: Payer: Self-pay | Admitting: Obstetrics and Gynecology

## 2022-12-07 ENCOUNTER — Encounter: Payer: Self-pay | Admitting: Obstetrics and Gynecology

## 2022-12-07 DIAGNOSIS — R928 Other abnormal and inconclusive findings on diagnostic imaging of breast: Secondary | ICD-10-CM

## 2022-12-08 ENCOUNTER — Other Ambulatory Visit: Payer: Self-pay | Admitting: Obstetrics and Gynecology

## 2022-12-08 DIAGNOSIS — R928 Other abnormal and inconclusive findings on diagnostic imaging of breast: Secondary | ICD-10-CM

## 2022-12-19 DIAGNOSIS — F209 Schizophrenia, unspecified: Secondary | ICD-10-CM | POA: Diagnosis not present

## 2022-12-19 DIAGNOSIS — F324 Major depressive disorder, single episode, in partial remission: Secondary | ICD-10-CM | POA: Diagnosis not present

## 2022-12-19 DIAGNOSIS — F411 Generalized anxiety disorder: Secondary | ICD-10-CM | POA: Diagnosis not present

## 2022-12-28 ENCOUNTER — Ambulatory Visit (INDEPENDENT_AMBULATORY_CARE_PROVIDER_SITE_OTHER): Payer: Medicare Other

## 2022-12-28 DIAGNOSIS — Z23 Encounter for immunization: Secondary | ICD-10-CM | POA: Diagnosis not present

## 2022-12-30 ENCOUNTER — Encounter: Payer: Self-pay | Admitting: Obstetrics and Gynecology

## 2022-12-30 DIAGNOSIS — M79672 Pain in left foot: Secondary | ICD-10-CM | POA: Diagnosis not present

## 2022-12-30 DIAGNOSIS — M722 Plantar fascial fibromatosis: Secondary | ICD-10-CM | POA: Diagnosis not present

## 2022-12-30 DIAGNOSIS — M7662 Achilles tendinitis, left leg: Secondary | ICD-10-CM | POA: Diagnosis not present

## 2022-12-31 NOTE — Telephone Encounter (Signed)
This was originally your pt so I will defer to you. Thank you.

## 2023-01-02 NOTE — Telephone Encounter (Signed)
Please see if we can accommodate patient on her day off, October 15th with me.

## 2023-01-09 NOTE — Progress Notes (Unsigned)
GYNECOLOGY PROGRESS NOTE  Subjective:    Patient ID: Diamond Lucas, female    DOB: [birth year], 62 y.o.   MRN: [id number]  HPI  Patient is a 62 y.o. G0P0000 female who presents for evaluation of pelvic issues.  {Common ambulatory SmartLinks:19316}  Review of Systems {ros; complete:30496}   Objective:   Last menstrual period 03/29/2015. There is no height or weight on file to calculate BMI. General appearance: {general exam:16600} Abdomen: {abdominal exam:16834} Pelvic: {pelvic exam:16852::"cervix normal in appearance","external genitalia normal","no adnexal masses or tenderness","no cervical motion tenderness","rectovaginal septum normal","uterus normal size, shape, and consistency","vagina normal without discharge"} Extremities: {extremity exam:5109} Neurologic: {neuro exam:17854}   Assessment:   No diagnosis found.   Plan:   There are no diagnoses linked to this encounter.     Hildred Laser, MD [facility]

## 2023-01-10 ENCOUNTER — Ambulatory Visit: Payer: Medicare Other | Admitting: Obstetrics and Gynecology

## 2023-01-10 ENCOUNTER — Encounter: Payer: Self-pay | Admitting: Obstetrics and Gynecology

## 2023-01-10 VITALS — BP 162/86 | HR 77 | Resp 16 | Ht 60.0 in | Wt 139.0 lb

## 2023-01-10 DIAGNOSIS — F411 Generalized anxiety disorder: Secondary | ICD-10-CM | POA: Diagnosis not present

## 2023-01-10 DIAGNOSIS — F324 Major depressive disorder, single episode, in partial remission: Secondary | ICD-10-CM | POA: Diagnosis not present

## 2023-01-10 DIAGNOSIS — F200 Paranoid schizophrenia: Secondary | ICD-10-CM | POA: Diagnosis not present

## 2023-01-10 DIAGNOSIS — F209 Schizophrenia, unspecified: Secondary | ICD-10-CM | POA: Diagnosis not present

## 2023-01-13 ENCOUNTER — Encounter: Payer: Self-pay | Admitting: Family Medicine

## 2023-01-16 ENCOUNTER — Other Ambulatory Visit: Payer: Self-pay | Admitting: Family Medicine

## 2023-01-16 DIAGNOSIS — M542 Cervicalgia: Secondary | ICD-10-CM

## 2023-01-16 MED ORDER — CYCLOBENZAPRINE HCL 5 MG PO TABS
5.0000 mg | ORAL_TABLET | Freq: Every day | ORAL | 0 refills | Status: AC | PRN
Start: 2023-01-16 — End: ?

## 2023-01-24 DIAGNOSIS — F324 Major depressive disorder, single episode, in partial remission: Secondary | ICD-10-CM | POA: Diagnosis not present

## 2023-01-24 DIAGNOSIS — F209 Schizophrenia, unspecified: Secondary | ICD-10-CM | POA: Diagnosis not present

## 2023-01-24 DIAGNOSIS — F411 Generalized anxiety disorder: Secondary | ICD-10-CM | POA: Diagnosis not present

## 2023-02-03 ENCOUNTER — Encounter: Payer: Self-pay | Admitting: Obstetrics and Gynecology

## 2023-02-08 DIAGNOSIS — F324 Major depressive disorder, single episode, in partial remission: Secondary | ICD-10-CM | POA: Diagnosis not present

## 2023-02-08 DIAGNOSIS — F411 Generalized anxiety disorder: Secondary | ICD-10-CM | POA: Diagnosis not present

## 2023-02-08 DIAGNOSIS — F209 Schizophrenia, unspecified: Secondary | ICD-10-CM | POA: Diagnosis not present

## 2023-02-09 ENCOUNTER — Encounter: Payer: Self-pay | Admitting: Family Medicine

## 2023-03-08 ENCOUNTER — Encounter: Payer: Self-pay | Admitting: Family Medicine

## 2023-03-08 ENCOUNTER — Ambulatory Visit: Payer: Medicare Other | Admitting: Family Medicine

## 2023-03-08 ENCOUNTER — Telehealth: Payer: Self-pay

## 2023-03-08 VITALS — BP 129/74 | HR 70 | Ht 61.0 in | Wt 137.0 lb

## 2023-03-08 DIAGNOSIS — N6489 Other specified disorders of breast: Secondary | ICD-10-CM

## 2023-03-08 DIAGNOSIS — Z860100 Personal history of colon polyps, unspecified: Secondary | ICD-10-CM | POA: Diagnosis not present

## 2023-03-08 DIAGNOSIS — F411 Generalized anxiety disorder: Secondary | ICD-10-CM | POA: Diagnosis not present

## 2023-03-08 DIAGNOSIS — F200 Paranoid schizophrenia: Secondary | ICD-10-CM

## 2023-03-08 DIAGNOSIS — F324 Major depressive disorder, single episode, in partial remission: Secondary | ICD-10-CM | POA: Diagnosis not present

## 2023-03-08 DIAGNOSIS — I10 Essential (primary) hypertension: Secondary | ICD-10-CM

## 2023-03-08 DIAGNOSIS — F22 Delusional disorders: Secondary | ICD-10-CM

## 2023-03-08 DIAGNOSIS — F312 Bipolar disorder, current episode manic severe with psychotic features: Secondary | ICD-10-CM

## 2023-03-08 DIAGNOSIS — F209 Schizophrenia, unspecified: Secondary | ICD-10-CM | POA: Diagnosis not present

## 2023-03-08 MED ORDER — LISINOPRIL 10 MG PO TABS: 10.0000 mg | ORAL_TABLET | Freq: Every day | ORAL | 3 refills | Status: DC

## 2023-03-08 NOTE — Assessment & Plan Note (Signed)
Due for repeat colon cancer screening ?

## 2023-03-08 NOTE — Assessment & Plan Note (Signed)
Chronic, stable Mild at this time; continue to monitor

## 2023-03-08 NOTE — Assessment & Plan Note (Signed)
Discussed previous incomplete mammogram Encouraged to schedule breast dx mammo and [place] Orders placed; pt plans to discuss Will need to call to schedule  [facility]  [address], [address] [facility] [place],  [place]  [address] Main: [phone]

## 2023-03-08 NOTE — Assessment & Plan Note (Signed)
Chronic, mild concerns at this time Continue to monitor Followed by psych; however, pt reports plans to come off of medication

## 2023-03-08 NOTE — Assessment & Plan Note (Signed)
Chronic, remains elevated Trial of 10 mg lisinopril with 1 month f/u Continue to monitor with goal of 119/79

## 2023-03-08 NOTE — Assessment & Plan Note (Signed)
Chronic, previously severe- currently stable with mild delusions Followed by psych

## 2023-03-08 NOTE — Progress Notes (Signed)
Established patient visit   Patient: Diamond Lucas   DOB: [birth year]   62 y.o. Female  MRN: [id number] Visit Date: 03/08/2023  Today's healthcare provider: Jacky Kindle, FNP  Introduced to nurse practitioner role and practice setting.  All questions answered.  Discussed provider/patient relationship and expectations.  Chief Complaint  Patient presents with   Follow-up    Pt stated---possible B/p elevated due to taking Voltaren gel.   Subjective    HPI HPI     Follow-up    Additional comments: Pt stated---possible B/p elevated due to taking Voltaren gel.      Last edited by Shelly Bombard, CMA on 03/08/2023  9:36 AM.      Medications: Outpatient Medications Prior to Visit  Medication Sig   CALCIUM CITRATE PO Take by mouth.   Carboxymeth-Glyc-Polysorb PF 0.5-1-0.5 % SOLN Apply 2 drops to eye 2 (two) times daily.   cyclobenzaprine (FLEXERIL) 5 MG tablet Take 1 tablet (5 mg total) by mouth daily as needed for muscle spasms.   Ibuprofen (MOTRIN PO) Take by mouth as needed (liquid).   Levothyroxine Sodium (TIROSINT) 50 MCG CAPS Take by mouth.   liothyronine (CYTOMEL) 5 MCG tablet Take 1 tablet (5 mcg total) by mouth daily.   Multiple Vitamin (MULTI-VITAMINS) TABS daily.    Omega-3 Fatty Acids (FISH OIL) 1000 MG CAPS Take by mouth. Takes occasionally due to causing acid reflux   risperiDONE (RISPERDAL) 1 MG tablet Take 1 mg by mouth at bedtime.   triamcinolone (NASACORT) 55 MCG/ACT AERO nasal inhaler Place 2 sprays into the nose as needed. Once daily as needed.   UNABLE TO FIND Take 1 capsule by mouth 2 (two) times daily. Med Name: VENEASE   No facility-administered medications prior to visit.    Review of Systems Last CBC Lab Results  Component Value Date   WBC 7.2 08/17/2022   HGB 14.4 08/17/2022   HCT 44 08/17/2022   MCV 91.8 10/31/2017   MCH 31.8 10/31/2017   RDW 13.0 10/31/2017   PLT 250 08/17/2022   Last metabolic panel Lab Results  Component  Value Date   GLUCOSE 81 10/08/2019   NA 138 08/17/2022   K 4.7 08/17/2022   CL 100 08/17/2022   CO2 26 (A) 08/17/2022   BUN 23 (A) 08/17/2022   CREATININE 0.8 08/17/2022   EGFR 82 08/17/2022   CALCIUM 9.3 08/17/2022   PROT 6.7 10/08/2019   ALBUMIN 4.1 10/08/2019   LABGLOB 2.6 10/08/2019   AGRATIO 1.6 10/08/2019   BILITOT 0.2 10/08/2019   ALKPHOS 100 10/08/2019   AST 19 08/17/2022   ALT 23 08/17/2022   ANIONGAP 8 10/31/2017   Last lipids Lab Results  Component Value Date   CHOL 229 (A) 08/17/2022   HDL 57 08/17/2022   LDLCALC 146 08/17/2022   TRIG 144 08/17/2022   CHOLHDL 3.4 10/31/2017   Last hemoglobin A1c Lab Results  Component Value Date   HGBA1C 5.4 08/17/2022   Last thyroid functions Lab Results  Component Value Date   TSH 4.78 08/17/2022   T3TOTAL 112 11/02/2017   T4TOTAL 12.0 (H) 02/10/2017     Objective    BP 129/74 (BP Location: Left Arm, Patient Position: Sitting, Cuff Size: Normal)   Pulse 70   Ht 5\' 1"  (1.549 m)   Wt 137 lb (62.1 kg)   LMP 03/29/2015   SpO2 97%   BMI 25.89 kg/m   BP Readings from Last 3 Encounters:  03/08/23 129/74  01/10/23 (!) 162/86  06/17/22 (!) 148/80   Wt Readings from Last 3 Encounters:  03/08/23 137 lb (62.1 kg)  01/10/23 139 lb (63 kg)  09/05/22 133 lb (60.3 kg)   SpO2 Readings from Last 3 Encounters:  03/08/23 97%  06/17/22 96%  02/23/22 100%   Physical Exam Vitals and nursing note reviewed.  Constitutional:      General: She is not in acute distress.    Appearance: Normal appearance. She is overweight. She is not ill-appearing, toxic-appearing or diaphoretic.  HENT:     Head: Normocephalic and atraumatic.  Cardiovascular:     Rate and Rhythm: Normal rate and regular rhythm.     Pulses: Normal pulses.     Heart sounds: Normal heart sounds. No murmur heard.    No friction rub. No gallop.  Pulmonary:     Effort: Pulmonary effort is normal. No respiratory distress.     Breath sounds: Normal breath  sounds. No stridor. No wheezing, rhonchi or rales.  Chest:     Chest wall: No tenderness.  Musculoskeletal:        General: No swelling, tenderness, deformity or signs of injury. Normal range of motion.     Right lower leg: No edema.     Left lower leg: No edema.  Skin:    General: Skin is warm and dry.     Capillary Refill: Capillary refill takes less than 2 seconds.     Coloration: Skin is not jaundiced or pale.     Findings: No bruising, erythema, lesion or rash.  Neurological:     General: No focal deficit present.     Mental Status: She is alert and oriented to person, place, and time. Mental status is at baseline.     Cranial Nerves: No cranial nerve deficit.     Sensory: No sensory deficit.     Motor: No weakness.     Coordination: Coordination normal.  Psychiatric:        Mood and Affect: Mood normal.        Behavior: Behavior normal.        Thought Content: Thought content is delusional.     No results found for any visits on 03/08/23.  Assessment & Plan     Problem List Items Addressed This Visit       Cardiovascular and Mediastinum   HTN (hypertension) - Primary    Chronic, remains elevated Trial of 10 mg lisinopril with 1 month f/u Continue to monitor with goal of 119/79      Relevant Medications   lisinopril (ZESTRIL) 10 MG tablet     Other   Bipolar I disorder, most recent episode manic, severe with psychotic features (HCC)    Chronic, previously severe- currently stable with mild delusions Followed by psych      Breast asymmetry    Discussed previous incomplete mammogram Encouraged to schedule breast dx mammo and [place] Orders placed; pt plans to discuss Will need to call to schedule  [facility] at [facility]  [address] [facility] [place],  [place]  [address] Main: [phone]        Relevant Orders   [place] LIMITED ULTRASOUND INCLUDING AXILLA RIGHT BREAST   MM 3D DIAGNOSTIC MAMMOGRAM  UNILATERAL RIGHT BREAST   Delusional disorder, somatic type (HCC)    Chronic, mild concerns at this time Continue to monitor Followed by psych; however, pt reports plans to come off of medication  History of colon polyps    Due for repeat colon cancer screening      Relevant Orders   Ambulatory referral to Gastroenterology   Paranoid schizophrenia (HCC)    Chronic, stable Mild at this time; continue to monitor      Return in about 4 weeks (around 04/05/2023), or if symptoms worsen or fail to improve, for blood pressure check, HTN management.     Leilani Merl, FNP, have reviewed all documentation for this visit. The documentation on 03/08/23 for the exam, diagnosis, procedures, and orders are all accurate and complete.  Jacky Kindle, FNP  [facility] [phone] (phone) [phone] (fax)  [facility]

## 2023-03-08 NOTE — Telephone Encounter (Signed)
Pt called requesting call back to schedule colonoscopy

## 2023-03-09 NOTE — Telephone Encounter (Signed)
okay

## 2023-03-14 ENCOUNTER — Telehealth: Payer: Self-pay

## 2023-03-14 NOTE — Telephone Encounter (Signed)
Pt requesting call back to schedule colonoscopy.

## 2023-03-16 ENCOUNTER — Encounter: Payer: Self-pay | Admitting: *Deleted

## 2023-04-01 ENCOUNTER — Encounter: Payer: Self-pay | Admitting: Obstetrics and Gynecology

## 2023-04-03 ENCOUNTER — Ambulatory Visit (INDEPENDENT_AMBULATORY_CARE_PROVIDER_SITE_OTHER): Payer: Medicare Other | Admitting: Surgery

## 2023-04-03 ENCOUNTER — Encounter: Payer: Self-pay | Admitting: Surgery

## 2023-04-03 VITALS — BP 152/86 | HR 76 | Temp 97.6°F | Ht 61.0 in | Wt 141.8 lb

## 2023-04-03 DIAGNOSIS — L723 Sebaceous cyst: Secondary | ICD-10-CM

## 2023-04-03 DIAGNOSIS — L729 Follicular cyst of the skin and subcutaneous tissue, unspecified: Secondary | ICD-10-CM

## 2023-04-03 NOTE — Patient Instructions (Signed)
Epidermoid Cyst  An epidermoid cyst, also called an epidermal cyst, is a small lump under your skin. The cyst contains a substance called keratin. Do not try to pop or open the cyst yourself. What are the causes? A blocked hair follicle. A hair that curls and re-enters the skin instead of growing straight out of the skin. A blocked pore. Irritated skin. An injury to the skin. Certain conditions that are passed along from parent to child. Human papillomavirus (HPV). This happens rarely when cysts occur on the bottom of the feet. Long-term sun damage to the skin. What increases the risk? Having acne. Being female. Having an injury to the skin. Being past puberty. Having certain conditions caused by genes (genetic disorder) What are the signs or symptoms? These cysts are usually harmless, but they can get infected. Symptoms of infection may include: Redness. Inflammation. Tenderness. Warmth. Fever. A bad-smelling substance that drains from the cyst. Pus that drains from the cyst. How is this treated? In many cases, epidermoid cysts go away on their own without treatment. If a cyst becomes infected, treatment may include: Opening and draining the cyst, done by a doctor. After draining, you may need minor surgery to remove the rest of the cyst. Antibiotic medicine. Shots of medicines (steroids) that help to reduce inflammation. Surgery to remove the cyst. Surgery may be done if the cyst: Becomes large. Bothers you. Has a chance of turning into cancer. Do not try to open a cyst yourself. Follow these instructions at home: Medicines Take over-the-counter and prescription medicines as told by your doctor. If you were prescribed an antibiotic medicine, take it as told by your doctor. Do not stop taking it even if you start to feel better. General instructions Keep the area around your cyst clean and dry. Wear loose, dry clothing. Avoid touching your cyst. Check your cyst every day  for signs of infection. Check for: Redness, swelling, or pain. Fluid or blood. Warmth. Pus or a bad smell. Keep all follow-up visits. How is this prevented? Wear clean, dry, clothing. Avoid wearing tight clothing. Keep your skin clean and dry. Take showers or baths every day. Contact a doctor if: Your cyst has symptoms of infection. Your condition does not improve or gets worse. You have a cyst that looks different from other cysts you have had. You have a fever. Get help right away if: Redness spreads from the cyst into the area close by. Summary An epidermoid cyst is a small lump under your skin. If a cyst becomes infected, treatment may include surgery to open and drain the cyst, or to remove it. Take over-the-counter and prescription medicines only as told by your doctor. Contact a doctor if your condition is not improving or is getting worse. Keep all follow-up visits. This information is not intended to replace advice given to you by your health care provider. Make sure you discuss any questions you have with your health care provider. Document Revised: 06/18/2019 Document Reviewed: 06/19/2019 Elsevier Patient Education  2024 [employer].

## 2023-04-03 NOTE — Progress Notes (Signed)
 04/03/2023  Reason for Visit:  Scalp cyst  Requesting Provider:  [name], FNP  History of Present Illness: Diamond Lucas is a 63 y.o. female presenting for evaluation of a sebaceous cyst in her scalp.  Patient reports that she has had this for about a year.  Over the last few months this has been causing more issues and feels that she has a bump on her scalp which is more aggravated when combing her hair.  She feels that there is some drainage of keratin material and feels like there is a horn coming out of it.  Denies any worsening pain but reports some tenderness or soreness at the area.  Denies any fevers or chills.  She reports she had also has 2 other areas in her scalp but not causing her issues at the time.  Past Medical History: Past Medical History:  Diagnosis Date   Allergy    Arrhythmia    Cervical radiculopathy 03/09/2021   History of chicken pox    History of measles    History of syncope    Hypertension    Osteoarthritis    Palpitations    Pure hypercholesterolemia    Rosacea    Schizophrenia (HCC)    Thyroid  disease      Past Surgical History: Past Surgical History:  Procedure Laterality Date   COLONOSCOPY WITH PROPOFOL  N/A 02/08/2018   Procedure: COLONOSCOPY WITH PROPOFOL ;  Surgeon: Janalyn Keene NOVAK, MD;  Location: [facility] ENDOSCOPY;  Service: Endoscopy;  Laterality: N/A;   FOOT SURGERY Right    THYROIDECTOMY      Home Medications: Prior to Admission medications   Medication Sig Start Date End Date Taking? Authorizing Provider  CALCIUM  CITRATE PO Take by mouth.   Yes [provider]  Carboxymeth-Glyc-Polysorb PF 0.5-1-0.5 % SOLN Apply 2 drops to eye 2 (two) times daily.   Yes [provider]  cyclobenzaprine  (FLEXERIL ) 5 MG tablet Take 1 tablet (5 mg total) by mouth daily as needed for muscle spasms. 01/16/23  Yes Cedar Kelly T, FNP  Ibuprofen  (MOTRIN  PO) Take by mouth as needed (liquid).   Yes [provider]   Levothyroxine  Sodium (TIROSINT ) 50 MCG CAPS Take by mouth. 04/29/22  Yes [provider]  liothyronine  (CYTOMEL ) 5 MCG tablet Take 1 tablet (5 mcg total) by mouth daily. 10/02/20  Yes Gasper Nancyann BRAVO, MD  Multiple Vitamin (MULTI-VITAMINS) TABS daily.    Yes [provider]  Omega-3 Fatty Acids (FISH OIL) 1000 MG CAPS Take by mouth. Takes occasionally due to causing acid reflux   Yes [provider]  risperiDONE  (RISPERDAL ) 1 MG tablet Take 1 mg by mouth at bedtime.   Yes [provider]  triamcinolone  (NASACORT ) 55 MCG/ACT AERO nasal inhaler Place 2 sprays into the nose as needed. Once daily as needed.   Yes [provider]  UNABLE TO FIND Take 1 capsule by mouth 2 (two) times daily. Med Name: [facility]   Yes [provider]    Allergies: Allergies  Allergen Reactions   Diclofenac Sodium Palpitations   Naproxen Itching, Rash and Hives   Olanzapine      Other reaction(s): Other Cross-eyed and hyperventilation   Gluten Meal Other (See Comments)    Abdominal bloating   Mite (D. Farinae) Itching    Respiratory illness, and itchy eyes, eye problems Respiratory illness, and itchy eyes, eye problems   Nsaids Other (See Comments)    Other reaction(s): Other (See Comments) Aleve causes hives 10/15 pt states she  occ. Takes Motrin . Other reaction(s): Other (See Comments) Aleve causes hives   Other Itching    Respiratory illness, and itchy eyes, eye problems Respiratory illness, and itchy eyes, eye problems   Synthroid  [Levothyroxine ]     Brand name only- causes acid reflux    Social History:  reports that she has never smoked. She has never used smokeless tobacco. She reports that she does not drink alcohol  and does not use drugs.   Family History: Family History  Problem Relation Age of Onset   Depression Mother    Cataracts Mother    Stroke Mother        mini   Kidney cancer Father    Anxiety disorder Brother    Cancer Other         unknown cancer type   Heart failure Other    Heart disease Other    Stroke Other    Diabetes Other     Review of Systems: Review of Systems  Constitutional:  Negative for chills and fever.  Respiratory:  Negative for shortness of breath.   Cardiovascular:  Negative for chest pain.  Gastrointestinal:  Negative for nausea and vomiting.  Skin:        Scalp cyst with drainage.    Physical Exam BP (!) 152/86   Pulse 76   Temp 97.6 F (36.4 C) (Oral)   Ht 5' 1 (1.549 m)   Wt 141 lb 12.8 oz (64.3 kg)   LMP 03/29/2015   SpO2 96%   BMI 26.79 kg/m  CONSTITUTIONAL: No acute distress HEENT:  Normocephalic, atraumatic, extraocular motion intact. RESPIRATORY:  Normal respiratory effort without pathologic use of accessory muscles. CARDIOVASCULAR: Regular rhythm and rate. MUSCULOSKELETAL:  Normal muscle strength and tone in all four extremities.  No peripheral edema or cyanosis. SKIN: The patient has at the top of the scalp, a 1.5 cm sebaceous cyst with a central opening that is about 5 mm in size.  There is some keratin and a scab that is protruding from this opening.  This was pulled out of the wound and appears that was able to excise some of the cyst capsule with it.  There is mild oozing from the area which was controlled with silver nitrate and manual pressure.  I was able to clean some of the remaining keratin and the wound and dressed this with dry gauze dressing and tape.  The patient also has 2 smaller sebaceous cyst that are flatter 1 in the posterior scalp and one on the left side of the scalp which are about 1 cm each these are not inflamed or irritated and do not have any drainage. NEUROLOGIC:  Motor and sensation is grossly normal.  Cranial nerves are grossly intact. PSYCH:  Alert and oriented to person, place and time. Affect is normal.  Laboratory Analysis: No results found for this or any previous visit (from the past 24 hours).  Imaging: No results found.  Assessment  and Plan: This is a 63 y.o. female with 3 sebaceous cyst in her scalp.  - The central top of the scalp cyst already had an opening with a scab and cyst contents coming out of this wound.  These were removed and partially removing at least some of the capsule of the cyst.  The rest was able to be cleaned out with a Q-tip and cauterized with silver nitrate.  Dry gauze dressing applied.  Does not appear infected and given that some of the capsule was excised, it may be  that she does not need any further procedures in that area.  The other 2 cysts are smaller without any inflammatory changes or drainage. - Discussed with patient that down the line we can excise all 3 cysts so these do not come back again.  For now, we can follow her in a week to make sure that this top of the scalp cyst is healing appropriately and then we can discuss timing for excision of the other cysts.  Patient in agreement with this plan. - Instructed the patient on dry gauze daily dressing changes.  No antibiotics are needed at this point. - Follow-up in 1 week.  I spent 30 minutes dedicated to the care of this patient on the date of this encounter to include pre-visit review of records, face-to-face time with the patient discussing diagnosis and management, and any post-visit coordination of care.   Aloysius Sheree Plant, MD [facility]

## 2023-04-05 ENCOUNTER — Ambulatory Visit: Payer: Federal, State, Local not specified - PPO | Admitting: Physician Assistant

## 2023-04-05 DIAGNOSIS — F324 Major depressive disorder, single episode, in partial remission: Secondary | ICD-10-CM | POA: Diagnosis not present

## 2023-04-05 DIAGNOSIS — F411 Generalized anxiety disorder: Secondary | ICD-10-CM | POA: Diagnosis not present

## 2023-04-05 DIAGNOSIS — F209 Schizophrenia, unspecified: Secondary | ICD-10-CM | POA: Diagnosis not present

## 2023-04-06 DIAGNOSIS — E890 Postprocedural hypothyroidism: Secondary | ICD-10-CM | POA: Diagnosis not present

## 2023-04-10 ENCOUNTER — Ambulatory Visit (INDEPENDENT_AMBULATORY_CARE_PROVIDER_SITE_OTHER): Payer: Medicare Other | Admitting: Surgery

## 2023-04-10 ENCOUNTER — Encounter: Payer: Self-pay | Admitting: Surgery

## 2023-04-10 VITALS — BP 184/98 | HR 80 | Temp 98.4°F | Ht 61.0 in | Wt 140.6 lb

## 2023-04-10 DIAGNOSIS — L728 Other follicular cysts of the skin and subcutaneous tissue: Secondary | ICD-10-CM

## 2023-04-10 DIAGNOSIS — L729 Follicular cyst of the skin and subcutaneous tissue, unspecified: Secondary | ICD-10-CM

## 2023-04-10 NOTE — Patient Instructions (Signed)
Epidermoid Cyst Drainage Epidermoid cyst drainage is a procedure to drain a fluid-filled sac that forms under your skin (epidermoid cyst). This type of cyst is filled with a thick, oily substance that is secreted by your skin glands. Epidermoid cysts are usually painless. You can often move the cyst under your skin. Sometimes an epidermoid cyst gets inflamed. It may become red, swollen, and painful. In this case, you may need this procedure to drain the cyst and provide relief from the discomfort caused by an inflamed cyst. Cysts that are treated only with drainage often come back (recur). You may need to have the cyst completely removed after healing from this procedure. Tell a health care provider about: Any allergies you have. All medicines you are taking, including vitamins, herbs, eye drops, creams, and over-the-counter medicines. Any problems you or family members have had with anesthetic medicines. Any blood disorders you have. Any surgeries you have had. Any medical conditions you have. Whether you are pregnant or may be pregnant. What are the risks? Generally, this is a safe procedure. However, problems may occur, including: Cyst recurrence. Infection. Bleeding. Allergic reactions to medicines. What happens before the procedure? Ask your health care provider about: Changing or stopping your regular medicines. This is especially important if you are taking diabetes medicines or blood thinners. Taking medicines such as aspirin and ibuprofen. These medicines can thin your blood. Do not take these medicines unless your health care provider tells you to take them. Taking over-the-counter medicines, vitamins, herbs, and supplements. Ask your heath care provider what steps will be taken to help prevent infection. This may include washing your skin with a germ-killing soap. What happens during the procedure? The skin around the cyst will be injected with a numbing medicine (local  anesthetic). An incision will be made over the cyst, and the wall of the cyst will be opened. A spreading instrument will be used to open up the cyst. The contents of the cyst will be removed with suction or irrigation. A thin strip of gauze packing may be placed in the cyst to keep it open and draining. The incision will be left open, and the cyst will be covered with a bandage (dressing). The procedure may vary among health care providers and hospitals. What can I expect after the procedure? After the procedure, it is common to have: Soreness. Blood-tinged fluid draining from the cyst. This drainage may stain your dressing. Follow these instructions at home: Medicines Take over-the-counter and prescription medicines only as told by your health care provider. If you were prescribed an antibiotic medicine, take it as told by your health care provider. Do not stop taking the antibiotic even if you start to feel better. Incision care  Follow instructions from your health care provider about how to take care of your incision. Make sure you: Wash your hands with soap and water for at least 20 seconds before and after you change your dressing. If soap and water are not available, use hand sanitizer. Change your dressing as told by your health care provider. Do not remove the packing. Do not try to put it back in if it falls out. You may need to return to your health care provider in a few days to have the packing removed. After your packing is removed, follow instructions from your health care provider about how to keep your incision area clean. Check your incision area every day for signs of infection. Check for: Redness, swelling, or pain. More fluid or blood.  Warmth. Pus or a bad smell. General instructions Return to your normal activities as told by your health care provider. Ask your health care provider what activities are safe for you. Do not take baths, swim, or use a hot tub until  your health care provider approves. Ask your health care provider if you may take showers. You may need to return to your health care provider to have the cyst removed after you heal from the drainage procedure. Keep all follow-up visits. This is important. Contact a health care provider if: You have chills or a fever. Blood soaks through your dressing. You have any signs of infection, especially spreading redness or increased pus coming from the cyst. Summary Epidermoid cyst drainage is a procedure to drain a fluid-filled sac that forms underneath your skin. If an epidermoid cyst gets inflamed, you may need to have a procedure to drain the cyst and relieve the discomfort caused by an inflamed cyst. During epidermoid cyst drainage, you will get local anesthesia so the cyst can be opened and the contents removed. You may have packing gauze put in the cyst to keep it open and draining. If you were prescribed an antibiotic medicine, take it as told by your health care provider. Do not stop taking the antibiotic even if you start to feel better. Epidermoid cysts often come back after drainage. You may need to have the cyst removed after you heal from the drainage procedure. This information is not intended to replace advice given to you by your health care provider. Make sure you discuss any questions you have with your health care provider. Document Revised: 06/18/2019 Document Reviewed: 06/19/2019 Elsevier Patient Education  2024 [employer].

## 2023-04-10 NOTE — Progress Notes (Signed)
 04/10/2023  History of Present Illness: Diamond Lucas is a 63 y.o. female presenting for evaluation of a scalp cyst.  The patient was last seen a week ago at which time he was noted to have an open wound on the top of her scalp with a scab adhered to a sebaceous cyst.  This was removed at bedside without any complications by manually squeezing on the cyst.  Residual oozing was controlled with silver nitrate.  The patient reports that this has continued to ooze from time to time.  She has been able to shower without complications to this area.  Denies any worsening pain.  Past Medical History: Past Medical History:  Diagnosis Date   Allergy    Arrhythmia    Cervical radiculopathy 03/09/2021   History of chicken pox    History of measles    History of syncope    Hypertension    Osteoarthritis    Palpitations    Pure hypercholesterolemia    Rosacea    Schizophrenia (HCC)    Thyroid  disease      Past Surgical History: Past Surgical History:  Procedure Laterality Date   COLONOSCOPY WITH PROPOFOL  N/A 02/08/2018   Procedure: COLONOSCOPY WITH PROPOFOL ;  Surgeon: Janalyn Keene NOVAK, MD;  Location: [facility] ENDOSCOPY;  Service: Endoscopy;  Laterality: N/A;   FOOT SURGERY Right    THYROIDECTOMY      Home Medications: Prior to Admission medications   Medication Sig Start Date End Date Taking? Authorizing Provider  CALCIUM  CITRATE PO Take by mouth.   Yes [provider]  Carboxymeth-Glyc-Polysorb PF 0.5-1-0.5 % SOLN Apply 2 drops to eye 2 (two) times daily.   Yes [provider]  cyclobenzaprine  (FLEXERIL ) 5 MG tablet Take 1 tablet (5 mg total) by mouth daily as needed for muscle spasms. 01/16/23  Yes Emilio Marseille T, FNP  Ibuprofen  (MOTRIN  PO) Take by mouth as needed (liquid).   Yes [provider]  Levothyroxine  Sodium (TIROSINT ) 50 MCG CAPS Take by mouth. 04/29/22  Yes [provider]  Multiple Vitamin (MULTI-VITAMINS) TABS daily.    Yes [provider]  Omega-3 Fatty Acids (FISH OIL) 1000 MG CAPS Take by mouth. Takes occasionally due to causing acid reflux   Yes [provider]  risperiDONE  (RISPERDAL ) 1 MG tablet Take 1 mg by mouth at bedtime.   Yes [provider]  triamcinolone  (NASACORT ) 55 MCG/ACT AERO nasal inhaler Place 2 sprays into the nose as needed. Once daily as needed.   Yes [provider]  UNABLE TO FIND Take 1 capsule by mouth 2 (two) times daily. Med Name: [facility]   Yes [provider]    Allergies: Allergies  Allergen Reactions   Diclofenac Sodium Palpitations   Naproxen Itching, Rash and Hives   Olanzapine      Other reaction(s): Other Cross-eyed and hyperventilation   Gluten Meal Other (See Comments)    Abdominal bloating   Mite (D. Farinae) Itching    Respiratory illness, and itchy eyes, eye problems Respiratory illness, and itchy eyes, eye problems   Nsaids Other (See Comments)    Other reaction(s): Other (See Comments) Aleve causes hives 10/15 pt states she occ. Takes Motrin . Other reaction(s): Other (See Comments) Aleve causes hives   Other Itching    Respiratory illness, and itchy eyes, eye problems Respiratory illness, and itchy eyes, eye problems   Synthroid  [Levothyroxine ]     Brand name only- causes acid reflux    Review of Systems: Review of Systems  Constitutional:  Negative for chills and fever.  Respiratory:  Negative for shortness of breath.   Cardiovascular:  Negative for chest pain.  Gastrointestinal:  Negative for nausea.  Skin:  Negative for rash.    Physical Exam BP (!) 184/98   Pulse 80   Temp 98.4 F (36.9 C) (Oral)   Ht 5' 1 (1.549 m)   Wt 140 lb 9.6 oz (63.8 kg)   LMP 03/29/2015   SpO2 96%   BMI 26.57 kg/m  CONSTITUTIONAL: No acute distress HEENT:  Normocephalic, atraumatic, extraocular motion intact. RESPIRATORY:  Normal respiratory effort without pathologic use of accessory muscles. CARDIOVASCULAR: Regular rhythm and  rate. SKIN: The patient's wound at the top of the scalp is smaller in size measuring now about 7 mm also becoming more shallow.  There is no purulence noted and only mild oozing with probing.  This was controlled with silver nitrate again.  There was a scab likely from dried up drainage adherent to the wound edges which was removed.  New dry gauze dressing applied. NEUROLOGIC:  Motor and sensation is grossly normal.  Cranial nerves are grossly intact. PSYCH:  Alert and oriented to person, place and time. Affect is normal.   Assessment and Plan: This is a 63 y.o. female with scalp cyst.  - Discussed with patient that scalp wound is healing and is smaller today compared to a week ago.  She should continue with dry gauze dressing daily.  Continue showering. - She will follow-up with me in 2 weeks to reassess her wound.  At that time if everything appears to be healing, we can schedule her for excision of the other 2 scalp cysts.  I spent 20 minutes dedicated to the care of this patient on the date of this encounter to include pre-visit review of records, face-to-face time with the patient discussing diagnosis and management, and any post-visit coordination of care.   Aloysius Sheree Plant, MD [facility]

## 2023-04-13 ENCOUNTER — Ambulatory Visit (INDEPENDENT_AMBULATORY_CARE_PROVIDER_SITE_OTHER): Payer: Medicare Other | Admitting: Family Medicine

## 2023-04-13 ENCOUNTER — Encounter: Payer: Self-pay | Admitting: Family Medicine

## 2023-04-13 VITALS — BP 139/76 | HR 80 | Resp 16 | Ht 61.0 in | Wt 143.1 lb

## 2023-04-13 DIAGNOSIS — I10 Essential (primary) hypertension: Secondary | ICD-10-CM

## 2023-04-13 DIAGNOSIS — F22 Delusional disorders: Secondary | ICD-10-CM | POA: Diagnosis not present

## 2023-04-13 DIAGNOSIS — E890 Postprocedural hypothyroidism: Secondary | ICD-10-CM | POA: Diagnosis not present

## 2023-04-13 NOTE — Assessment & Plan Note (Addendum)
Continues to have concerns for fetuses - recommend discussing this is with OB/GYN Pt states she has lowered her risperidone Continues to see psychiatrist Recommend discussing this matter with their provider

## 2023-04-13 NOTE — Progress Notes (Signed)
Established Patient Office Visit  Introduced to nurse practitioner role and practice setting.  All questions answered.  Discussed provider/patient relationship and expectations.   Subjective   Patient ID: Diamond Lucas, female    DOB: May 08, 1960  Age: 63 y.o. MRN: [id number]  Chief Complaint  Patient presents with   Hypertension   The patient, a 63 year old female, presents with concerns about persistently elevated blood pressure.  The patient has noticed a consistent elevation in her blood pressure since using Voltaren gel for her Achilles tendonitis. She reports one instance of her blood pressure spiking to 184. Despite discontinuing the Voltaren gel, her blood pressure has remained high. At home, she has been monitoring her blood pressure,with SBP in 140s  The patient expresses concerns about starting antihypertensive medication due to potential side effects. She also mentions a unique concern about potential harm to what she believes to be fetuses she is carrying, despite being post-menopausal. She has been in consultation with an OBGYN about this matter.  Hypertension        04/13/2023    3:40 PM 03/08/2023    9:38 AM 09/05/2022   11:18 AM  Depression screen PHQ 2/9  Decreased Interest 1 0 1  Down, Depressed, Hopeless 1 3 1   PHQ - 2 Score 2 3 2   Altered sleeping 0 0 0  Tired, decreased energy 1 3 1   Change in appetite 0 0 0  Feeling bad or failure about yourself  0 2 0  Trouble concentrating 0 0 0  Moving slowly or fidgety/restless 0 1 0  Suicidal thoughts 0 0 0  PHQ-9 Score 3 9 3   Difficult doing work/chores Somewhat difficult Somewhat difficult Not difficult at all       04/13/2023    3:40 PM 03/08/2023    9:39 AM 06/08/2021    9:02 AM  GAD 7 : Generalized Anxiety Score  Nervous, Anxious, on Edge 1 1 1   Control/stop worrying 0 2 1  Worry too much - different things 1 2 1   Trouble relaxing 0 2 1  Restless 0 0 0  Easily annoyed or irritable 0 0 0   Afraid - awful might happen 1 3 1   Total GAD 7 Score 3 10 5   Anxiety Difficulty Somewhat difficult  Somewhat difficult     Review of Systems  All other systems reviewed and are negative.   Negative unless indicated in HPI   Objective:     BP 139/76   Pulse 80   Resp 16   Ht 5\' 1"  (1.549 m)   Wt 143 lb 1.6 oz (64.9 kg)   LMP 03/29/2015   SpO2 99%   BMI 27.04 kg/m    Physical Exam Constitutional:      General: She is not in acute distress.    Appearance: Normal appearance. She is not ill-appearing, toxic-appearing or diaphoretic.  Cardiovascular:     Rate and Rhythm: Normal rate and regular rhythm.     Heart sounds: No murmur heard.    No friction rub. No gallop.  Pulmonary:     Effort: No respiratory distress.     Breath sounds: No stridor. No wheezing, rhonchi or rales.  Chest:     Chest wall: No tenderness.  Skin:    Capillary Refill: Capillary refill takes less than 2 seconds.  Neurological:     General: No focal deficit present.     Mental Status: She is alert. Mental status is at baseline.  Psychiatric:  Attention and Perception: Attention normal.        Mood and Affect: Mood normal.        Speech: Speech normal.        Behavior: Behavior normal. Behavior is cooperative.        Thought Content: Thought content is paranoid and delusional. Thought content does not include homicidal or suicidal ideation.        Cognition and Memory: Cognition is impaired.        Judgment: Judgment is inappropriate.      No results found for any visits on 04/13/23.    The 10-year ASCVD risk score (Arnett DK, et al., 2019) is: 4.2%* (Cholesterol units were assumed)    Assessment & Plan:  Primary hypertension Assessment & Plan: Consistently elevated blood pressure readings, with recent reading of 139/76.  -Discussed the need for medication to prevent end organ complications such as heart attack, stroke, kidney disease, and eye disease. -Goal SBP<120 DBP<80   -Patient expressed concerns about potential side effects and impact on fetuses she believes she is carrying. Provided patient with information on three potential medications:amlodipine, losartan, and hydrochlorothiazide. Labetalol was not considered due to not recommended for her disease progression at this time. -Pt declined options -Printed off information on amlodipine, losartan, and hydrochlorothiazide for patient to review. -Recommended patient discuss medication options with her OBGYN or given option to refer to cardiology for HTN if she would like to talk with them. -Plan to follow up in 4-6 weeks to reassess blood pressure. - Low sodium diet, exercise, and weight loss recommended -Will check CMP  Orders: -     Comprehensive metabolic panel  Delusional disorder, somatic type [facility]) Assessment & Plan: Continues to have concerns for fetuses - recommend discussing this is with OB/GYN Pt states she has lowered her risperidone Continues to see psychiatrist Recommend discussing this matter with their provider     Return in about 6 weeks (around 05/25/2023), or HTN discussion.   I, Sallee Provencal, FNP, have reviewed all documentation for this visit. The documentation on 04/13/23 for the exam, diagnosis, procedures, and orders are all accurate and complete.   Sallee Provencal, FNP

## 2023-04-13 NOTE — Assessment & Plan Note (Addendum)
Consistently elevated blood pressure readings, with recent reading of 139/76.  -Discussed the need for medication to prevent end organ complications such as heart attack, stroke, kidney disease, and eye disease. -Goal SBP<120 DBP<80  -Patient expressed concerns about potential side effects and impact on fetuses she believes she is carrying. Provided patient with information on three potential medications:amlodipine, losartan, and hydrochlorothiazide. Labetalol was not considered due to not recommended for her disease progression at this time. -Pt declined options -Printed off information on amlodipine, losartan, and hydrochlorothiazide for patient to review. -Recommended patient discuss medication options with her OBGYN or given option to refer to cardiology for HTN if she would like to talk with them. -Plan to follow up in 4-6 weeks to reassess blood pressure. - Low sodium diet, exercise, and weight loss recommended -Will check CMP

## 2023-04-14 ENCOUNTER — Other Ambulatory Visit: Payer: Self-pay | Admitting: Family Medicine

## 2023-04-14 ENCOUNTER — Encounter: Payer: Self-pay | Admitting: Family Medicine

## 2023-04-14 DIAGNOSIS — I10 Essential (primary) hypertension: Secondary | ICD-10-CM

## 2023-04-14 LAB — COMPREHENSIVE METABOLIC PANEL
ALT: 26 [IU]/L (ref 0–32)
AST: 18 [IU]/L (ref 0–40)
Albumin: 4.4 g/dL (ref 3.8–4.9)
Alkaline Phosphatase: 107 [IU]/L (ref 44–121)
BUN/Creatinine Ratio: 28 (ref 12–28)
BUN: 23 mg/dL (ref 8–27)
Bilirubin Total: <0.2 mg/dL (ref 0.0–1.2)
CO2: 26 mmol/L (ref 20–29)
Calcium: 9.3 mg/dL (ref 8.7–10.3)
Chloride: 100 mmol/L (ref 96–106)
Creatinine, Ser: 0.83 mg/dL (ref 0.57–1.00)
Globulin, Total: 2.5 g/dL (ref 1.5–4.5)
Glucose: 90 mg/dL (ref 70–99)
Potassium: 4.1 mmol/L (ref 3.5–5.2)
Sodium: 140 mmol/L (ref 134–144)
Total Protein: 6.9 g/dL (ref 6.0–8.5)
eGFR: 81 mL/min/{1.73_m2} (ref >59)

## 2023-04-14 MED ORDER — AMLODIPINE BESYLATE 5 MG PO TABS: 5.0000 mg | ORAL_TABLET | Freq: Every day | ORAL | 0 refills | Status: DC

## 2023-04-24 ENCOUNTER — Encounter: Payer: Self-pay | Admitting: Surgery

## 2023-04-24 ENCOUNTER — Ambulatory Visit (INDEPENDENT_AMBULATORY_CARE_PROVIDER_SITE_OTHER): Payer: Medicare Other | Admitting: Surgery

## 2023-04-24 VITALS — BP 122/77 | HR 78 | Temp 98.0°F | Ht 61.0 in | Wt 141.0 lb

## 2023-04-24 DIAGNOSIS — L728 Other follicular cysts of the skin and subcutaneous tissue: Secondary | ICD-10-CM | POA: Diagnosis not present

## 2023-04-24 DIAGNOSIS — L729 Follicular cyst of the skin and subcutaneous tissue, unspecified: Secondary | ICD-10-CM

## 2023-04-24 NOTE — Progress Notes (Signed)
04/24/2023  History of Present Illness: Diamond Lucas is a 63 y.o. female presenting for follow up of scalp cysts.  She has a cyst at the top of her scalp which had developed an ulceration and scabbing, and on our first visit with her, I was able to squeeze most of the capsule and keratin contents.  She also has two additional smaller cysts in the posterior scalp and left lateral scalp.  Patient reports that overall the main cyst wound has closed and sealed.  Denies any drainage or oozing.  Past Medical History: Past Medical History:  Diagnosis Date   Allergy    Arrhythmia    Cervical radiculopathy 03/09/2021   History of chicken pox    History of measles    History of syncope    Hypertension    Osteoarthritis    Palpitations    Pure hypercholesterolemia    Rosacea    Schizophrenia (HCC)    Thyroid disease      Past Surgical History: Past Surgical History:  Procedure Laterality Date   COLONOSCOPY WITH PROPOFOL N/A 02/08/2018   Procedure: COLONOSCOPY WITH PROPOFOL;  Surgeon: Pasty Spillers, MD;  Location: [facility] ENDOSCOPY;  Service: Endoscopy;  Laterality: N/A;   FOOT SURGERY Right    THYROIDECTOMY      Home Medications: Prior to Admission medications   Medication Sig Start Date End Date Taking? Authorizing Provider  amLODipine (NORVASC) 5 MG tablet Take 1 tablet (5 mg total) by mouth daily. 04/14/23  Yes Charlcie Cradle A, FNP  CALCIUM CITRATE PO Take by mouth.   Yes [provider]  Carboxymeth-Glyc-Polysorb PF 0.5-1-0.5 % SOLN Apply 2 drops to eye 2 (two) times daily.   Yes [provider]  cyclobenzaprine (FLEXERIL) 5 MG tablet Take 1 tablet (5 mg total) by mouth daily as needed for muscle spasms. 01/16/23  Yes Merita Norton T, FNP  Ibuprofen (MOTRIN PO) Take by mouth as needed (liquid).   Yes [provider]  Levothyroxine Sodium (TIROSINT) 50 MCG CAPS Take 75 mcg by mouth daily. 04/29/22  Yes [provider]  Multiple Vitamin  (MULTI-VITAMINS) TABS daily.    Yes [provider]  Omega-3 Fatty Acids (FISH OIL) 1000 MG CAPS Take by mouth. Takes occasionally due to causing acid reflux   Yes [provider]  risperiDONE (RISPERDAL) 1 MG tablet Take 0.5 mg by mouth at bedtime.   Yes [provider]  triamcinolone (NASACORT) 55 MCG/ACT AERO nasal inhaler Place 2 sprays into the nose as needed. Once daily as needed.   Yes [provider]  UNABLE TO FIND Take 1 capsule by mouth 2 (two) times daily. Med Name: [facility]   Yes [provider]    Allergies: Allergies  Allergen Reactions   Diclofenac Sodium Palpitations   Naproxen Itching, Rash and Hives   Olanzapine     Other reaction(s): Other Cross-eyed and hyperventilation   Gluten Meal Other (See Comments)    Abdominal bloating   Mite (D. Farinae) Itching    Respiratory illness, and itchy eyes, eye problems Respiratory illness, and itchy eyes, eye problems   Nsaids Other (See Comments)    Other reaction(s): Other (See Comments) Aleve causes hives 10/15 pt states she occ. Takes Motrin. Other reaction(s): Other (See Comments) Aleve causes hives   Other Itching    Respiratory illness, and itchy eyes, eye problems Respiratory illness, and itchy eyes, eye problems   Synthroid [Levothyroxine]     Brand name only- causes acid reflux  Review of Systems: Review of Systems  Constitutional:  Negative for chills and fever.  Respiratory:  Negative for shortness of breath.   Cardiovascular:  Negative for chest pain.  Skin:        Scalp wound healed.    Physical Exam BP 122/77   Pulse 78   Temp 98 F (36.7 C)   Ht 5\' 1"  (1.549 m)   Wt 141 lb (64 kg)   LMP 03/29/2015   SpO2 98%   BMI 26.64 kg/m  CONSTITUTIONAL: No acute distress HEENT:  Normocephalic, atraumatic, extraocular motion intact. SKIN:  Wound at top of scalp is full healed, without any drainage.  Feels flat, without any residual cyst or contents palpable.   The other two cysts are stable without any erythema or induration NEUROLOGIC:  Motor and sensation is grossly normal.  Cranial nerves are grossly intact. PSYCH:  Alert and oriented to person, place and time. Affect is normal.   Assessment and Plan: This is a 63 y.o. female with scalp cysts  --The main cyst which had drained before is now fully healed.  I do feel that a significant portion of the capsule was removed during the initial drainage, and now the area feels fully flat.  Discussed that this may not grow back and we could possibly avoid any procedure there at the moment.  The other two cysts we could remove as in-office procedures. --Patient will call us back when she's ready to schedule.  I spent 10 minutes dedicated to the care of this patient on the date of this encounter to include pre-visit review of records, face-to-face time with the patient discussing diagnosis and management, and any post-visit coordination of care.   Howie Ill, MD [facility]

## 2023-04-24 NOTE — Patient Instructions (Signed)
We will get you scheduled for an excision of scalp cyst. Please call us to schedule this once you are ready. This should take about 60 minutes in office.

## 2023-04-26 DIAGNOSIS — F324 Major depressive disorder, single episode, in partial remission: Secondary | ICD-10-CM | POA: Diagnosis not present

## 2023-04-26 DIAGNOSIS — F209 Schizophrenia, unspecified: Secondary | ICD-10-CM | POA: Diagnosis not present

## 2023-04-26 DIAGNOSIS — F411 Generalized anxiety disorder: Secondary | ICD-10-CM | POA: Diagnosis not present

## 2023-04-28 DIAGNOSIS — M722 Plantar fascial fibromatosis: Secondary | ICD-10-CM | POA: Diagnosis not present

## 2023-04-28 DIAGNOSIS — M79674 Pain in right toe(s): Secondary | ICD-10-CM | POA: Diagnosis not present

## 2023-04-28 DIAGNOSIS — L600 Ingrowing nail: Secondary | ICD-10-CM | POA: Diagnosis not present

## 2023-04-28 DIAGNOSIS — M7662 Achilles tendinitis, left leg: Secondary | ICD-10-CM | POA: Diagnosis not present

## 2023-05-12 ENCOUNTER — Encounter: Payer: Self-pay | Admitting: Obstetrics and Gynecology

## 2023-05-18 ENCOUNTER — Other Ambulatory Visit: Payer: Self-pay | Admitting: *Deleted

## 2023-05-18 ENCOUNTER — Telehealth: Payer: Self-pay | Admitting: *Deleted

## 2023-05-18 DIAGNOSIS — Z860100 Personal history of colon polyps, unspecified: Secondary | ICD-10-CM

## 2023-05-18 MED ORDER — NA SULFATE-K SULFATE-MG SULF 17.5-3.13-1.6 GM/177ML PO SOLN
1.0000 | Freq: Once | ORAL | 0 refills | Status: AC
Start: 2023-05-18 — End: 2023-05-18

## 2023-05-18 NOTE — Telephone Encounter (Signed)
 Gastroenterology Pre-Procedure Review  Request Date: 07/27/2023 Requesting Physician: Dr. Allegra Lai  PATIENT REVIEW QUESTIONS: The patient responded to the following health history questions as indicated:    1. Are you having any GI issues? no 2. Do you have a personal history of Polyps? yes (02/08/2018 with Dr Maximino Greenland) 3. Do you have a family history of Colon Cancer or Polyps? no 4. Diabetes Mellitus? no 5. Joint replacements in the past 12 months?no 6. Major health problems in the past 3 months?no 7. Any artificial heart valves, MVP, or defibrillator?no    MEDICATIONS & ALLERGIES:    Patient reports the following regarding taking any anticoagulation/antiplatelet therapy:   Plavix, Coumadin, Eliquis, Xarelto, Lovenox, Pradaxa, Brilinta, or Effient? no Aspirin? no  Patient confirms/reports the following medications:  Current Outpatient Medications  Medication Sig Dispense Refill   amLODipine (NORVASC) 5 MG tablet Take 1 tablet (5 mg total) by mouth daily. 60 tablet 0   CALCIUM CITRATE PO Take by mouth.     Carboxymeth-Glyc-Polysorb PF 0.5-1-0.5 % SOLN Apply 2 drops to eye 2 (two) times daily.     cyclobenzaprine (FLEXERIL) 5 MG tablet Take 1 tablet (5 mg total) by mouth daily as needed for muscle spasms. 90 tablet 0   Ibuprofen (MOTRIN PO) Take by mouth as needed (liquid).     Levothyroxine Sodium (TIROSINT) 50 MCG CAPS Take 75 mcg by mouth daily.     Multiple Vitamin (MULTI-VITAMINS) TABS daily.      Omega-3 Fatty Acids (FISH OIL) 1000 MG CAPS Take by mouth. Takes occasionally due to causing acid reflux     risperiDONE (RISPERDAL) 1 MG tablet Take 0.5 mg by mouth at bedtime.     triamcinolone (NASACORT) 55 MCG/ACT AERO nasal inhaler Place 2 sprays into the nose as needed. Once daily as needed.     UNABLE TO FIND Take 1 capsule by mouth 2 (two) times daily. Med Name: VENEASE     No current facility-administered medications for this visit.    Patient confirms/reports the following  allergies:  Allergies  Allergen Reactions   Diclofenac Sodium Palpitations   Naproxen Itching, Rash and Hives   Olanzapine     Other reaction(s): Other Cross-eyed and hyperventilation   Gluten Meal Other (See Comments)    Abdominal bloating   Mite (D. Farinae) Itching    Respiratory illness, and itchy eyes, eye problems Respiratory illness, and itchy eyes, eye problems   Nsaids Other (See Comments)    Other reaction(s): Other (See Comments) Aleve causes hives 10/15 pt states she occ. Takes Motrin. Other reaction(s): Other (See Comments) Aleve causes hives   Other Itching    Respiratory illness, and itchy eyes, eye problems Respiratory illness, and itchy eyes, eye problems   Synthroid [Levothyroxine]     Brand name only- causes acid reflux    No orders of the defined types were placed in this encounter.   AUTHORIZATION INFORMATION Primary Insurance: 1D#: Group #:  Secondary Insurance: 1D#: Group #:  SCHEDULE INFORMATION: Date: 07/27/2023 Time: Location:  [facility]

## 2023-05-23 DIAGNOSIS — F324 Major depressive disorder, single episode, in partial remission: Secondary | ICD-10-CM | POA: Diagnosis not present

## 2023-05-23 DIAGNOSIS — F411 Generalized anxiety disorder: Secondary | ICD-10-CM | POA: Diagnosis not present

## 2023-05-23 DIAGNOSIS — E890 Postprocedural hypothyroidism: Secondary | ICD-10-CM | POA: Diagnosis not present

## 2023-05-23 DIAGNOSIS — F209 Schizophrenia, unspecified: Secondary | ICD-10-CM | POA: Diagnosis not present

## 2023-05-24 DIAGNOSIS — M7662 Achilles tendinitis, left leg: Secondary | ICD-10-CM | POA: Diagnosis not present

## 2023-05-24 DIAGNOSIS — M6281 Muscle weakness (generalized): Secondary | ICD-10-CM | POA: Diagnosis not present

## 2023-05-27 ENCOUNTER — Encounter: Payer: Self-pay | Admitting: Family Medicine

## 2023-06-01 DIAGNOSIS — H01003 Unspecified blepharitis right eye, unspecified eyelid: Secondary | ICD-10-CM | POA: Diagnosis not present

## 2023-06-01 DIAGNOSIS — H2513 Age-related nuclear cataract, bilateral: Secondary | ICD-10-CM | POA: Diagnosis not present

## 2023-06-05 DIAGNOSIS — M7662 Achilles tendinitis, left leg: Secondary | ICD-10-CM | POA: Diagnosis not present

## 2023-06-05 DIAGNOSIS — M7752 Other enthesopathy of left foot: Secondary | ICD-10-CM | POA: Diagnosis not present

## 2023-06-05 DIAGNOSIS — M722 Plantar fascial fibromatosis: Secondary | ICD-10-CM | POA: Diagnosis not present

## 2023-06-07 DIAGNOSIS — F209 Schizophrenia, unspecified: Secondary | ICD-10-CM | POA: Diagnosis not present

## 2023-06-07 DIAGNOSIS — F324 Major depressive disorder, single episode, in partial remission: Secondary | ICD-10-CM | POA: Diagnosis not present

## 2023-06-07 DIAGNOSIS — F411 Generalized anxiety disorder: Secondary | ICD-10-CM | POA: Diagnosis not present

## 2023-06-10 ENCOUNTER — Encounter: Payer: Self-pay | Admitting: Family Medicine

## 2023-06-12 ENCOUNTER — Other Ambulatory Visit: Payer: Self-pay | Admitting: Family Medicine

## 2023-06-12 DIAGNOSIS — I10 Essential (primary) hypertension: Secondary | ICD-10-CM

## 2023-06-15 ENCOUNTER — Ambulatory Visit: Admitting: Obstetrics and Gynecology

## 2023-06-15 ENCOUNTER — Encounter: Payer: Self-pay | Admitting: Obstetrics and Gynecology

## 2023-06-22 DIAGNOSIS — H01003 Unspecified blepharitis right eye, unspecified eyelid: Secondary | ICD-10-CM | POA: Diagnosis not present

## 2023-06-22 DIAGNOSIS — H04123 Dry eye syndrome of bilateral lacrimal glands: Secondary | ICD-10-CM | POA: Diagnosis not present

## 2023-06-26 ENCOUNTER — Encounter: Payer: Self-pay | Admitting: Family Medicine

## 2023-06-26 DIAGNOSIS — B850 Pediculosis due to Pediculus humanus capitis: Secondary | ICD-10-CM

## 2023-06-27 ENCOUNTER — Ambulatory Visit: Payer: Self-pay

## 2023-06-27 DIAGNOSIS — M7752 Other enthesopathy of left foot: Secondary | ICD-10-CM | POA: Diagnosis not present

## 2023-06-27 DIAGNOSIS — M2041 Other hammer toe(s) (acquired), right foot: Secondary | ICD-10-CM | POA: Diagnosis not present

## 2023-06-27 DIAGNOSIS — M722 Plantar fascial fibromatosis: Secondary | ICD-10-CM | POA: Diagnosis not present

## 2023-06-27 DIAGNOSIS — M79674 Pain in right toe(s): Secondary | ICD-10-CM | POA: Diagnosis not present

## 2023-06-27 NOTE — Telephone Encounter (Signed)
 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 Pt states "Im 5'1" and they pelt me in the head. I do lice shampoos and tea tree". Pt states that she recently noticed them going from her hairline to her eye lashes/brows. Pt states she would like an rx to get rid of them. Gave care instructions per Epic.   Copied from CRM [id number]. Topic: Clinical - Medical Advice >> Jun 27, 2023 12:58 PM Patsy Lager T wrote: Reason for CRM: patient called to f/u on the mychart message she went to [place] on 3/31 about the issue she is having with head lice. Patient is requesting a medication. Please f/u with patient Reason for Disposition  [1] More than 24 hours since completing treatment with permethrin (e.g., NIX Creme Rinse, Kwellada-P Creme Rinse) AND [0] moving lice are seen in the hair  Answer Assessment - Initial Assessment Questions 1. LICE APPEARANCE: "Have you seen any lice?" If Yes, ask: "What do they look like?" (Correct answer: A gray bug that's 1/16 inch or 2 millimeters long)      Some  look like little crabs others look like fish and are black 2. NITS APPEARANCE: "Have you seen any eggs (nits) in the hair?" If Yes, ask: "What do they look like?" (Correct answer: white or tan eggs attached to hair shafts)     No eggs 3. ONSET: "How long have the eggs been present?"      I have had itching on and off for 3 years, pt uses lice shampoo but states recently they have gotten into her eye brows and eye lashes 4. ITCH: "Is the scalp itchy?" If Yes, ask: "How bad is the itch?"      Not  at this time 5. RASH: "Is there a rash?" If Yes, ask: "What does it look like?"      denies 6. TREATMENT:  "What treatment have you tried?" "What happened?"     Lice shampoo,  Protocols used: Head Lice-A-AH

## 2023-06-28 DIAGNOSIS — F209 Schizophrenia, unspecified: Secondary | ICD-10-CM | POA: Diagnosis not present

## 2023-06-28 DIAGNOSIS — F411 Generalized anxiety disorder: Secondary | ICD-10-CM | POA: Diagnosis not present

## 2023-06-28 DIAGNOSIS — F324 Major depressive disorder, single episode, in partial remission: Secondary | ICD-10-CM | POA: Diagnosis not present

## 2023-06-29 ENCOUNTER — Other Ambulatory Visit: Payer: Self-pay

## 2023-06-29 NOTE — Telephone Encounter (Signed)
 Ok to send permethrim 1% - Prior to application, wash hair with conditioner-free shampoo; rinse with water and towel dry. Apply a sufficient amount of lotion or cream rinse to saturate the hair and scalp (especially behind the ears and nape of neck). Leave on hair for 10 minutes (but no longer), then rinse off with warm water; remove remaining nits with nit comb. A single application is generally sufficient; however, may repeat 7 days after first treatment if lice or nits are still present.

## 2023-06-29 NOTE — Telephone Encounter (Signed)
 Pt called back to check on prescription for Permethrin. Please advise pt

## 2023-06-29 NOTE — Telephone Encounter (Signed)
 Copied from CRM [id number]. Topic: Clinical - Medical Advice >> Jun 27, 2023 12:58 PM Patsy Lager T wrote: Reason for CRM: patient called to f/u on the mychart message she went to [place] on 3/31 about the issue she is having with head lice. Patient is requesting a medication. Please f/u with patient >> Jun 29, 2023 11:00 AM Clayton Bibles wrote: Franshesca is calling back because no one called her yesterday. [place] is wanted to get Permethrin Cream 5% because over the counter stuff is now working.

## 2023-06-30 NOTE — Addendum Note (Signed)
 Addended by: Lily Kocher on: 06/30/2023 04:30 PM   Modules accepted: Orders

## 2023-07-02 MED ORDER — PERMETHRIN 5 % EX CREA: TOPICAL_CREAM | CUTANEOUS | 0 refills | Status: DC

## 2023-07-03 ENCOUNTER — Other Ambulatory Visit: Payer: Self-pay | Admitting: Family Medicine

## 2023-07-03 MED ORDER — PERMETHRIN 1 % EX LIQD: 1.0000 | Freq: Once | CUTANEOUS | 0 refills | Status: AC

## 2023-07-03 MED ORDER — PERMETHRIN 1 % EX LIQD: 1.0000 | Freq: Once | CUTANEOUS | 0 refills | Status: DC

## 2023-07-11 ENCOUNTER — Encounter: Payer: Self-pay | Admitting: Family Medicine

## 2023-07-11 ENCOUNTER — Ambulatory Visit (INDEPENDENT_AMBULATORY_CARE_PROVIDER_SITE_OTHER): Admitting: Family Medicine

## 2023-07-11 ENCOUNTER — Other Ambulatory Visit: Payer: Self-pay | Admitting: Family Medicine

## 2023-07-11 VITALS — BP 136/85 | HR 90 | Ht 61.0 in | Wt 134.1 lb

## 2023-07-11 DIAGNOSIS — B850 Pediculosis due to Pediculus humanus capitis: Secondary | ICD-10-CM

## 2023-07-11 DIAGNOSIS — R051 Acute cough: Secondary | ICD-10-CM | POA: Diagnosis not present

## 2023-07-11 MED ORDER — BENZONATATE 100 MG PO CAPS: 100.0000 mg | ORAL_CAPSULE | Freq: Two times a day (BID) | ORAL | 0 refills | Status: DC | PRN

## 2023-07-11 MED ORDER — PERMETHRIN 5 % EX CREA: TOPICAL_CREAM | CUTANEOUS | 0 refills | Status: DC

## 2023-07-11 NOTE — Progress Notes (Addendum)
 "    ACUTE PATIENT VISIT    Patient: Diamond Lucas   DOB: 12/17/1960   63 y.o. 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 and knew one of them came in and dropped the lice on her    Subjective     HPI     Sore Throat    Additional comments: Onset 10 days, going from itchy to sore Is very worried the lice are in her throat        Head Lice    Additional comments: Pt reports having headlice on and off  Said in [place] she saw eye doctor and headlice went down from head to eyebrows, got into her eyes and then layed eggs nits in her nose  [place]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       Discussed the use of AI scribe software for clinical note transcription with the patient, who gave verbal consent to proceed.  History of Present Illness          Discussed the use of AI scribe software for clinical note transcription with the patient, who gave verbal consent to proceed.  History of Present Illness Diamond Lucas is a 63 year old female with paranoid schizophrenia and delusional disorder who presents with a sore throat and itching.  She has been experiencing a sore throat and itching for the past ten days. She is concerned about having lice in her throat. Additionally, she mentions having head lice intermittently and believes that the lice have moved from her head  to her eyebrows, into her eyes, and laid eggs in her nose. 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  She was recently prescribed a cream, referred to as 'Permethrin   5% cream', earlier today for her condition.  Her medical history includes paranoid schizophrenia, delusional disorder, somatic type, bipolar one disorder, thyroid  malignancy, and hypertension. She is allergic to diclofenac, naproxen, olanzapine , mites, Synthroid , and gluten.  The patient reports a cough that has been bothersome and interrupting sleep.     Past Medical History:  Diagnosis Date   Allergy    Arrhythmia    Cervical radiculopathy 03/09/2021   History of chicken pox    History of measles    History of syncope    Hypertension    Osteoarthritis    Palpitations    Pure hypercholesterolemia    Rosacea    Schizophrenia (HCC)    Thyroid  disease     Medications: Outpatient Medications Prior to Visit  Medication Sig   amLODipine  (NORVASC ) 5 MG tablet TAKE 1 TABLET(5 MG) BY MOUTH DAILY   CALCIUM  CITRATE PO Take by mouth.  Carboxymeth-Glyc-Polysorb PF 0.5-1-0.5 % SOLN Apply 2 drops to eye 2 (two) times daily.   cyclobenzaprine  (FLEXERIL ) 5 MG tablet Take 1 tablet (5 mg total) by mouth daily as needed for muscle spasms.   Ibuprofen  (MOTRIN  PO) Take by mouth as needed (liquid).   Levothyroxine  Sodium (TIROSINT ) 50 MCG CAPS Take 75 mcg by mouth daily.   Multiple Vitamin (MULTI-VITAMINS) TABS daily.    Omega-3 Fatty Acids (FISH OIL) 1000 MG CAPS Take by mouth. Takes occasionally due to causing acid reflux   permethrin  (ELIMITE ) 5 % cream Apply to damp hair that has just been shampooed with a nonconditioning shampoo; saturate hair and scalp beginning behind the ears and at back of neck; leave on 10 minutes; rinse with warm water; remove nits with nit comb; repeat application if live lice present 7 days after  initial treatment   triamcinolone  (NASACORT ) 55 MCG/ACT AERO nasal inhaler Place 2 sprays into the nose as needed. Once daily as needed.   UNABLE TO FIND Take 1 capsule by mouth 2 (two) times daily. Med Name: VENEASE   risperiDONE  (RISPERDAL ) 1 MG tablet Take 0.5 mg by mouth at bedtime. (Patient not taking: Reported on 07/11/2023)   No facility-administered medications prior to visit.    Review of Systems      Objective    BP 136/85 (BP Location: Right Arm, Patient Position: Sitting, Cuff Size: Small)   Pulse 90   Ht 5' 1 (1.549 m)   Wt 134 lb 1.6 oz (60.8 kg)   LMP 03/29/2015   SpO2 97%   BMI 25.34 kg/m      Physical Exam  SKIN: no nits visualized on the scalp/eyebrows/eye lashes, no rashes visualized on skin, no interdigit web space excoriations noted  HEENT: no oropharyngeal erythema,no tonsillar exudate  No results found for any visits on 07/11/23.  Assessment & Plan     Problem List Items Addressed This Visit   None Visit Diagnoses       Acute cough    -  Primary   Relevant Medications   benzonatate  (TESSALON ) 100 MG capsule     Lice infested hair             Assessment & Plan Sore throat with pruritus and reported lice infection with cough  Presents with a 10-day history of sore throat and itching. Concerns about lice in her throat are likely delusional, given her psychiatric history. The sore throat may be viral or bacterial, but pruritus and delusional component suggest a psychosomatic element. - Reassure regarding the absence of lice in the throat. - prescribed tessalon  perles 100mg  BID  - Consider symptomatic treatment for sore throat, such as lozenges or warm saline gargles. - recommended that she continue triamcinolone  nasal spray for allergy symptoms - continue to use permethrin  cream 5% as previously directed for lice diagnosis    Delusional disorder, somatic type Belief of lice infestation in various body parts aligns with delusional disorder,  somatic type, involving persistent delusions related to bodily functions or sensations. - Continue psychiatric management and ensure follow-up with mental health services.     No follow-ups on file.         Rockie Agent, MD  [facility] [phone] (phone) [phone] (fax)  [facility] "

## 2023-07-12 ENCOUNTER — Encounter: Payer: Self-pay | Admitting: Family Medicine

## 2023-07-12 ENCOUNTER — Ambulatory Visit: Admitting: Physician Assistant

## 2023-07-12 NOTE — Telephone Encounter (Signed)
Please see the message below and advise.

## 2023-07-13 ENCOUNTER — Encounter: Payer: Self-pay | Admitting: Family Medicine

## 2023-07-13 DIAGNOSIS — H04123 Dry eye syndrome of bilateral lacrimal glands: Secondary | ICD-10-CM | POA: Diagnosis not present

## 2023-07-13 DIAGNOSIS — L539 Erythematous condition, unspecified: Secondary | ICD-10-CM | POA: Diagnosis not present

## 2023-07-13 DIAGNOSIS — H01003 Unspecified blepharitis right eye, unspecified eyelid: Secondary | ICD-10-CM | POA: Diagnosis not present

## 2023-07-19 ENCOUNTER — Encounter: Payer: Self-pay | Admitting: Family Medicine

## 2023-07-19 DIAGNOSIS — F411 Generalized anxiety disorder: Secondary | ICD-10-CM | POA: Diagnosis not present

## 2023-07-19 DIAGNOSIS — F324 Major depressive disorder, single episode, in partial remission: Secondary | ICD-10-CM | POA: Diagnosis not present

## 2023-07-19 DIAGNOSIS — F209 Schizophrenia, unspecified: Secondary | ICD-10-CM | POA: Diagnosis not present

## 2023-07-24 NOTE — Telephone Encounter (Signed)
FYI please see the message below.

## 2023-07-27 ENCOUNTER — Ambulatory Visit
Admission: RE | Admit: 2023-07-27 | Discharge: 2023-07-27 | Disposition: A | Discharge status: Home / Self Care | Payer: Medicare Other | Attending: Gastroenterology | Admitting: Gastroenterology

## 2023-07-27 ENCOUNTER — Other Ambulatory Visit: Payer: Self-pay

## 2023-07-27 ENCOUNTER — Encounter: Payer: Self-pay | Admitting: Gastroenterology

## 2023-07-27 ENCOUNTER — Ambulatory Visit: Admitting: Anesthesiology

## 2023-07-27 ENCOUNTER — Encounter
Admission: RE | Disposition: A | Discharge status: Home / Self Care | Payer: Self-pay | Source: Home / Self Care | Attending: Gastroenterology

## 2023-07-27 DIAGNOSIS — K621 Rectal polyp: Secondary | ICD-10-CM | POA: Insufficient documentation

## 2023-07-27 DIAGNOSIS — D175 Benign lipomatous neoplasm of intra-abdominal organs: Secondary | ICD-10-CM

## 2023-07-27 DIAGNOSIS — E039 Hypothyroidism, unspecified: Secondary | ICD-10-CM | POA: Diagnosis not present

## 2023-07-27 DIAGNOSIS — Z79899 Other long term (current) drug therapy: Secondary | ICD-10-CM | POA: Diagnosis not present

## 2023-07-27 DIAGNOSIS — K635 Polyp of colon: Secondary | ICD-10-CM | POA: Diagnosis not present

## 2023-07-27 DIAGNOSIS — I10 Essential (primary) hypertension: Secondary | ICD-10-CM | POA: Insufficient documentation

## 2023-07-27 DIAGNOSIS — Z860100 Personal history of colon polyps, unspecified: Secondary | ICD-10-CM

## 2023-07-27 DIAGNOSIS — Z860101 Personal history of adenomatous and serrated colon polyps: Secondary | ICD-10-CM | POA: Diagnosis not present

## 2023-07-27 DIAGNOSIS — Z79890 Hormone replacement therapy (postmenopausal): Secondary | ICD-10-CM | POA: Insufficient documentation

## 2023-07-27 DIAGNOSIS — Z1211 Encounter for screening for malignant neoplasm of colon: Secondary | ICD-10-CM | POA: Insufficient documentation

## 2023-07-27 HISTORY — PX: POLYPECTOMY: SHX149

## 2023-07-27 HISTORY — PX: COLONOSCOPY WITH PROPOFOL: SHX5780

## 2023-07-27 SURGERY — COLONOSCOPY WITH PROPOFOL
Anesthesia: General

## 2023-07-27 MED ORDER — LIDOCAINE HCL URETHRAL/MUCOSAL 2 % EX GEL
CUTANEOUS | Status: AC
Start: 2023-07-27 — End: ?
  Filled 2023-07-27: qty 5

## 2023-07-27 MED ORDER — PROPOFOL 10 MG/ML IV BOLUS
INTRAVENOUS | Status: DC | PRN
  Administered 2023-07-27: 30 mg via INTRAVENOUS
  Administered 2023-07-27: 60 mg via INTRAVENOUS

## 2023-07-27 MED ORDER — LIDOCAINE HCL (PF) 2 % IJ SOLN
INTRAMUSCULAR | Status: AC
  Filled 2023-07-27: qty 5

## 2023-07-27 MED ORDER — PROPOFOL 500 MG/50ML IV EMUL
INTRAVENOUS | Status: DC | PRN
  Administered 2023-07-27: 150 ug/kg/min via INTRAVENOUS

## 2023-07-27 MED ORDER — LIDOCAINE HCL (CARDIAC) PF 100 MG/5ML IV SOSY
PREFILLED_SYRINGE | INTRAVENOUS | Status: DC | PRN
  Administered 2023-07-27: 60 mg via INTRAVENOUS

## 2023-07-27 MED ORDER — SODIUM CHLORIDE 0.9 % IV SOLN: INTRAVENOUS | Status: DC

## 2023-07-27 MED ORDER — PROPOFOL 10 MG/ML IV BOLUS
INTRAVENOUS | Status: AC
  Filled 2023-07-27: qty 40

## 2023-07-27 NOTE — H&P (Signed)
 Karma Oz, MD [address]  [address]  [place], [place] [address]  Main: [phone]  Fax: [phone] Pager: [phone]  Primary Care Physician:  Tasia Farr, FNP Primary Gastroenterologist:  Dr. Karma Oz  Pre-Procedure History & Physical: HPI:  Diamond Lucas is a 63 y.o. female is here for an colonoscopy.   Past Medical History:  Diagnosis Date   Allergy    Arrhythmia    Cervical radiculopathy 03/09/2021   History of chicken pox    History of measles    History of syncope    Hypertension    Osteoarthritis    Palpitations    Pure hypercholesterolemia    Rosacea    Schizophrenia (HCC)    Thyroid  disease     Past Surgical History:  Procedure Laterality Date   COLONOSCOPY WITH PROPOFOL  N/A 02/08/2018   Procedure: COLONOSCOPY WITH PROPOFOL ;  Surgeon: Irby Mannan, MD;  Location: [facility] END[facility]COPY;  Service: Endoscopy;  Laterality: N/A;   FOOT SURGERY Right    THYROIDECTOMY      Prior to Admission medications   Medication Sig Start Date End Date Taking? Authorizing Provider  amLODipine  (NORVASC ) 5 MG tablet TAKE 1 TABLET(5 MG) BY MOUTH DAILY 06/12/23  Yes Rufus Council A, FNP  CALCIUM  CITRATE PO Take by mouth.   Yes [provider]  Levothyroxine  Sodium (TIROSINT ) 50 MCG CAPS Take 75 mcg by mouth daily. 04/29/22  Yes [provider]  Multiple Vitamin (MULTI-VITAMINS) TABS daily.    Yes [provider]  Omega-3 Fatty Acids (FISH OIL) 1000 MG CAPS Take by mouth. Takes occasionally due to causing acid reflux   Yes [provider]  UNABLE TO FIND Take 1 capsule by mouth 2 (two) times daily. Med Name: VENEASE   Yes [provider]  benzonatate  (TESSALON ) 100 MG capsule Take 1 capsule (100 mg total) by mouth 2 (two) times daily as needed for cough. 07/11/23   Simmons-Robinson, Judyann Number, MD  Carboxymeth-Glyc-Polysorb PF 0.5-1-0.5 % SOLN Apply 2 drops to eye 2 (two) times daily.    [provider]  cyclobenzaprine  (FLEXERIL ) 5 MG tablet Take 1 tablet (5 mg total) by mouth daily as needed for muscle spasms. 01/16/23   Normie Becton, FNP  Ibuprofen  (MOTRIN  PO) Take by mouth as needed (liquid).    [provider]  permethrin  (ELIMITE ) 5 % cream Apply to damp hair that has just been shampooed with a nonconditioning shampoo; saturate hair and scalp beginning behind the ears and at back of neck; leave on 10 minutes; rinse with warm water; remove nits with nit comb; repeat application if live lice present 7 days after initial treatment 07/11/23   Carlean Charter, DO  risperiDONE  (RISPERDAL ) 1 MG tablet Take 0.5 mg by mouth at bedtime. Patient not taking: Reported on 07/11/2023    [provider]  triamcinolone  (NASACORT ) 55 MCG/ACT AERO nasal inhaler Place 2 sprays into the nose as needed. Once daily as needed.    [provider]    Allergies as of 05/18/2023 - Review Complete 04/24/2023  Allergen Reaction Noted   Diclofenac sodium Palpitations 11/14/2014   Naproxen Itching, Rash, and Hives 11/14/2014   Olanzapine   07/21/2016   Gluten meal Other (See Comments) 04/05/2017   Mite (d. farinae) Itching 12/03/2014   Nsaids Other (See Comments) 05/03/2014   Other Itching 12/03/2014   Synthroid  [levothyroxine ]  08/12/2019    Family History  Problem Relation Age of Onset   Depression Mother  Cataracts Mother    Stroke Mother        mini   Kidney cancer Father    Anxiety disorder Brother    Cancer Other        unknown cancer type   Heart failure Other    Heart disease Other    Stroke Other    Diabetes Other     Social History   Socioeconomic History   Marital status: Single    Spouse name: Not on file   Number of children: 0   Years of education: Not on file   Highest education level: Bachelor's degree (e.g., BA, AB, BS)  Occupational History   Occupation: retired  Tobacco Use   Smoking status: Never    Passive exposure: Never    Smokeless tobacco: Never  Vaping Use   Vaping status: Never Used  Substance and Sexual Activity   Alcohol use: No   Drug use: No   Sexual activity: Not Currently    Birth control/protection: Post-menopausal, Abstinence  Other Topics Concern   Not on file  Social History Narrative   Not on file   Social Drivers of Health   Financial Resource Strain: High Risk (04/10/2023)   Overall Financial Resource Strain (CARDIA)    Difficulty of Paying Living Expenses: Hard  Food Insecurity: Food Insecurity Present (04/10/2023)   Hunger Vital Sign    Worried About Running Out of Food in the Last Year: Sometimes true    Ran Out of Food in the Last Year: Never true  Transportation Needs: No Transportation Needs (04/10/2023)   PRAPARE - Administrator, Civil Service (Medical): No    Lack of Transportation (Non-Medical): No  Physical Activity: Insufficiently Active (04/10/2023)   Exercise Vital Sign    Days of Exercise per Week: 4 days    Minutes of Exercise per Session: 20 min  Stress: No Stress Concern Present (04/10/2023)   [employer] of Occupational Health - Occupational Stress Questionnaire    Feeling of Stress : Only a little  Recent Concern: Stress - Stress Concern Present (03/07/2023)   [employer] of Occupational Health - Occupational Stress Questionnaire    Feeling of Stress : To some extent  Social Connections: Moderately Isolated (04/10/2023)   Social Connection and Isolation Panel [NHANES]    Frequency of Communication with Friends and Family: Once a week    Frequency of Social Gatherings with Friends and Family: Once a week    Attends Religious Services: More than 4 times per year    Active Member of [employer] or Organizations: Yes    Attends Engineer, structural: More than 4 times per year    Marital Status: Divorced  Intimate Partner Violence: Not At Risk (09/05/2022)   Humiliation, Afraid, Rape, and Kick questionnaire    Fear of Current or  Ex-Partner: No    Emotionally Abused: No    Physically Abused: No    Sexually Abused: No    Review of Systems: See HPI, otherwise negative ROS  Physical Exam: BP (!) 144/83   Pulse 72   Temp (!) 96.3 F (35.7 C) (Temporal)   Resp 18   Ht 5\' 1"  (1.549 m)   Wt 59.8 kg   LMP 03/29/2015   SpO2 100%   BMI 24.90 kg/m  General:   Alert,  pleasant and cooperative in NAD Head:  Normocephalic and atraumatic. Neck:  Supple; no masses or thyromegaly. Lungs:  Clear throughout to auscultation.    Heart:  Regular  rate and rhythm. Abdomen:  Soft, nontender and nondistended. Normal bowel sounds, without guarding, and without rebound.   Neurologic:  Alert and  oriented x4;  grossly normal neurologically.  Impression/Plan: Diamond Lucas is here for an colonoscopy to be performed for h/o colon adenomas  Risks, benefits, limitations, and alternatives regarding  colonoscopy have been reviewed with the patient.  Questions have been answered.  All parties agreeable.   Ellis Guys, MD  07/27/2023, 8:46 AM

## 2023-07-27 NOTE — Anesthesia Preprocedure Evaluation (Signed)
 Anesthesia Evaluation  Patient identified by MRN, date of birth, ID band Patient awake    Reviewed: Allergy & Precautions, NPO status , Patient's Chart, lab work & pertinent test results  History of Anesthesia Complications Negative for: history of anesthetic complications  Airway Mallampati: III  TM Distance: <3 FB Neck ROM: full    Dental  (+) Chipped   Pulmonary neg pulmonary ROS, neg shortness of breath   Pulmonary exam normal        Cardiovascular Exercise Tolerance: Good hypertension, (-) angina Normal cardiovascular exam     Neuro/Psych  PSYCHIATRIC DISORDERS       Neuromuscular disease    GI/Hepatic negative GI ROS, Neg liver ROS,neg GERD  ,,  Endo/Other  Hypothyroidism    Renal/GU negative Renal ROS  negative genitourinary   Musculoskeletal   Abdominal   Peds  Hematology negative hematology ROS (+)   Anesthesia Other Findings Past Medical History: No date: Allergy No date: Arrhythmia 03/09/2021: Cervical radiculopathy No date: History of chicken pox No date: History of measles No date: History of syncope No date: Hypertension No date: Osteoarthritis No date: Palpitations No date: Pure hypercholesterolemia No date: Rosacea No date: Schizophrenia (HCC) No date: Thyroid  disease  Past Surgical History: 02/08/2018: COLONOSCOPY WITH PROPOFOL ; N/A     Comment:  Procedure: COLONOSCOPY WITH PROPOFOL ;  Surgeon:               Irby Mannan, MD;  Location: [facility] END[facility]COPY;                Service: Endoscopy;  Laterality: N/A; No date: FOOT SURGERY; Right No date: THYROIDECTOMY  BMI    Body Mass Index: 24.90 kg/m      Reproductive/Obstetrics negative OB ROS                             Anesthesia Physical Anesthesia Plan  ASA: 2  Anesthesia Plan: General   Post-op Pain Management:    Induction: Intravenous  PONV Risk Score and Plan: Propofol  infusion and  TIVA  Airway Management Planned: Natural Airway and Nasal Cannula  Additional Equipment:   Intra-op Plan:   Post-operative Plan:   Informed Consent: I have reviewed the patients History and Physical, chart, labs and discussed the procedure including the risks, benefits and alternatives for the proposed anesthesia with the patient or authorized representative who has indicated his/her understanding and acceptance.     Dental Advisory Given  Plan Discussed with: Anesthesiologist, CRNA and Surgeon  Anesthesia Plan Comments: (Patient consented for risks of anesthesia including but not limited to:  - adverse reactions to medications - risk of airway placement if required - damage to eyes, teeth, lips or other oral mucosa - nerve damage due to positioning  - sore throat or hoarseness - Damage to heart, brain, nerves, lungs, other parts of body or loss of life  Patient voiced understanding and assent.)       Anesthesia Quick Evaluation

## 2023-07-27 NOTE — Op Note (Signed)
 [facility] Gastroenterology Patient Name: Diamond Lucas Procedure Date: 07/27/2023 8:46 AM MRN: [id number] Account #: [id number] Date of Birth: [birth year] Admit Type: Outpatient Age: 63 Room: [facility] ENDO ROOM 3 Gender: Female Note Status: Finalized Instrument Name: Peds Colonoscope [id number] Procedure:             Colonoscopy Indications:           Surveillance: Personal history of adenomatous polyps                         on last colonoscopy 5 years ago, Last colonoscopy:                         November 2019 Providers:             Selena Daily MD, MD Medicines:             General Anesthesia Complications:         No immediate complications. Estimated blood loss: None. Procedure:             Pre-Anesthesia Assessment:                        - Prior to the procedure, a History and Physical was                         performed, and patient medications and allergies were                         reviewed. The patient is competent. The risks and                         benefits of the procedure and the sedation options and                         risks were discussed with the patient. All questions                         were answered and informed consent was obtained.                         Patient identification and proposed procedure were                         verified by the physician, the nurse, the                         anesthesiologist, the anesthetist and the technician                         in the pre-procedure area in the procedure room in the                         endoscopy suite. Mental Status Examination: alert and                         oriented. Airway Examination: normal oropharyngeal                         airway and neck  mobility. Respiratory Examination:                         clear to auscultation. CV Examination: normal.                         Prophylactic Antibiotics: The patient does not require                          prophylactic antibiotics. Prior Anticoagulants: The                         patient has taken no anticoagulant or antiplatelet                         agents. ASA Grade Assessment: II - A patient with mild                         systemic disease. After reviewing the risks and                         benefits, the patient was deemed in satisfactory                         condition to undergo the procedure. The anesthesia                         plan was to use general anesthesia. Immediately prior                         to administration of medications, the patient was                         re-assessed for adequacy to receive sedatives. The                         heart rate, respiratory rate, oxygen saturations,                         blood pressure, adequacy of pulmonary ventilation, and                         response to care were monitored throughout the                         procedure. The physical status of the patient was                         re-assessed after the procedure.                        After obtaining informed consent, the colonoscope was                         passed under direct vision. Throughout the procedure,                         the patient's blood pressure, pulse, and oxygen  saturations were monitored continuously. The                         Colonoscope was introduced through the anus and                         advanced to the the terminal ileum, with                         identification of the appendiceal orifice and IC                         valve. The colonoscopy was performed without                         difficulty. The patient tolerated the procedure well.                         The quality of the bowel preparation was evaluated                         using the BBPS [facility] Bowel Preparation Scale) with                         scores of: Right Colon = 3, Transverse Colon = 3 and                         Left  Colon = 3 (entire mucosa seen well with no                         residual staining, small fragments of stool or opaque                         liquid). The total BBPS score equals 9. The terminal                         ileum, ileocecal valve, appendiceal orifice, and                         rectum were photographed. Findings:      The perianal and digital rectal examinations were normal. Pertinent       negatives include normal sphincter tone and no palpable rectal lesions.      A 5 mm polyp was found in the proximal rectum. The polyp was sessile.       The polyp was removed with a cold snare. Resection and retrieval were       complete. Estimated blood loss: none.      The retroflexed view of the distal rectum and anal verge was normal and       showed no anal or rectal abnormalities.      The terminal ileum appeared normal. Impression:            - One 5 mm polyp in the proximal rectum, removed with                         a cold snare. Resected and retrieved.                        -  The distal rectum and anal verge are normal on                         retroflexion view.                        - The examined portion of the ileum was normal. Recommendation:        - Discharge patient to home (with escort).                        - Resume previous diet today.                        - Continue present medications.                        - Await pathology results.                        - Repeat colonoscopy in 5-10 years for surveillance                         based on pathology results. Procedure Code(s):     --- Professional ---                        [phone], Colonoscopy, flexible; with removal of                         tumor(s), polyp(s), or other lesion(s) by snare                         technique Diagnosis Code(s):     --- Professional ---                        Z86.010, Personal history of colonic polyps                        D12.8, Benign neoplasm of rectum CPT copyright  2022 American Medical Association. All rights reserved. The codes documented in this report are preliminary and upon coder review may  be revised to meet current compliance requirements. Dr. Evia Hof Selena Daily MD, MD 07/27/2023 9:20:40 AM This report has been signed electronically. Number of Addenda: 0 Note Initiated On: 07/27/2023 8:46 AM Scope Withdrawal Time: 0 hours 7 minutes 40 seconds  Total Procedure Duration: 0 hours 10 minutes 56 seconds  Estimated Blood Loss:  Estimated blood loss: none.      [facility]

## 2023-07-27 NOTE — Anesthesia Postprocedure Evaluation (Signed)
 Anesthesia Post Note  Patient: Diamond Lucas  Procedure(s) Performed: COLONOSCOPY WITH PROPOFOL  POLYPECTOMY, INTESTINE  Patient location during evaluation: Endoscopy Anesthesia Type: General Level of consciousness: awake and alert Pain management: pain level controlled Vital Signs Assessment: post-procedure vital signs reviewed and stable Respiratory status: spontaneous breathing, nonlabored ventilation, respiratory function stable and patient connected to nasal cannula oxygen Cardiovascular status: blood pressure returned to baseline and stable Postop Assessment: no apparent nausea or vomiting Anesthetic complications: no   No notable events documented.   Last Vitals:  Vitals:   07/27/23 0924 07/27/23 0932  BP: 131/74 128/86  Pulse: 73 66  Resp: 20 18  Temp: (!) 36.2 C   SpO2: 100% 100%    Last Pain:  Vitals:   07/27/23 0932  TempSrc:   PainSc: 0-No pain                 Portia Brittle Jonnathan Birman

## 2023-07-27 NOTE — Transfer of Care (Signed)
 Immediate Anesthesia Transfer of Care Note  Patient: Marlon Simpson Rising  Procedure(s) Performed: COLONOSCOPY WITH PROPOFOL  POLYPECTOMY, INTESTINE  Patient Location: Endoscopy Unit  Anesthesia Type:General  Level of Consciousness: awake and alert   Airway & Oxygen Therapy: Patient Spontanous Breathing  Post-op Assessment: Report given to RN and Post -op Vital signs reviewed and stable  Post vital signs: Reviewed and stable  Last Vitals:  Vitals Value Taken Time  BP 131/74 07/27/23 0924  Temp 36.2 C 07/27/23 0924  Pulse 73 07/27/23 0924  Resp 20 07/27/23 0924  SpO2 100 % 07/27/23 0924    Last Pain:  Vitals:   07/27/23 0924  TempSrc: Temporal  PainSc: Asleep         Complications: No notable events documented.

## 2023-07-28 LAB — SURGICAL PATHOLOGY

## 2023-07-28 NOTE — Telephone Encounter (Signed)
 Please see the message below.

## 2023-07-29 ENCOUNTER — Emergency Department
Admission: EM | Admit: 2023-07-29 | Discharge: 2023-07-29 | Disposition: A | Discharge status: Home / Self Care | Attending: Emergency Medicine | Admitting: Emergency Medicine

## 2023-07-29 ENCOUNTER — Encounter: Payer: Self-pay | Admitting: Obstetrics and Gynecology

## 2023-07-29 DIAGNOSIS — S0502XA Injury of conjunctiva and corneal abrasion without foreign body, left eye, initial encounter: Secondary | ICD-10-CM | POA: Diagnosis not present

## 2023-07-29 DIAGNOSIS — I10 Essential (primary) hypertension: Secondary | ICD-10-CM | POA: Diagnosis not present

## 2023-07-29 DIAGNOSIS — S0592XA Unspecified injury of left eye and orbit, initial encounter: Secondary | ICD-10-CM | POA: Diagnosis present

## 2023-07-29 DIAGNOSIS — W448XXA Other foreign body entering into or through a natural orifice, initial encounter: Secondary | ICD-10-CM | POA: Diagnosis not present

## 2023-07-29 MED ORDER — FLUORESCEIN SODIUM 1 MG OP STRP
1.0000 | ORAL_STRIP | Freq: Once | OPHTHALMIC | Status: DC
  Filled 2023-07-29: qty 1

## 2023-07-29 MED ORDER — FLUORESCEIN SODIUM 1 MG OP STRP
1.0000 | ORAL_STRIP | Freq: Once | OPHTHALMIC | Status: DC
Start: 2023-07-29 — End: 2023-07-29

## 2023-07-29 MED ORDER — TOBRAMYCIN 0.3 % OP SOLN: 2.0000 [drp] | OPHTHALMIC | 0 refills | Status: DC

## 2023-07-29 MED ORDER — TETRACAINE HCL 0.5 % OP SOLN
2.0000 [drp] | Freq: Once | OPHTHALMIC | Status: DC
Start: 2023-07-29 — End: 2023-07-29

## 2023-07-29 MED ORDER — TETRACAINE HCL 0.5 % OP SOLN
2.0000 [drp] | Freq: Once | OPHTHALMIC | Status: AC
  Administered 2023-07-29: 2 [drp] via OPHTHALMIC
  Filled 2023-07-29: qty 4

## 2023-07-29 NOTE — ED Provider Triage Note (Signed)
 Emergency Medicine Provider Triage Evaluation Note  Diamond Lucas , a 63 y.o. female  was evaluated in triage.  Pt complains of eye pain after eating cinnamon oil in her eye.  Review of Systems  Positive:  Negative:   Physical Exam  BP (!) 158/93 (BP Location: Left Arm)   Pulse 76   Temp 98 F (36.7 C) (Oral)   Resp 18   LMP 03/29/2015   SpO2 97%  Gen:   Awake, no distress   Resp:  Normal effort  MSK:   Moves extremities without difficulty  Other:    Medical Decision Making  Medically screening exam initiated at 11:42 AM.  Appropriate orders placed.  Diamond Lucas was informed that the remainder of the evaluation will be completed by another provider, this initial triage assessment does not replace that evaluation, and the importance of remaining in the ED until their evaluation is complete.  Will place tetracaine patient's eye   Delsie Figures, PA-C 07/29/23 1143

## 2023-07-29 NOTE — ED Notes (Signed)
 Labs collected. Gr, Ltgr, Lav, Red

## 2023-07-29 NOTE — ED Triage Notes (Signed)
 First nurse note: Arrived by EMS from home. C/o shampoo in left eye.   Patient was saying bizarre statements to EMS stating "the two fetuses are kicking. I have other concerns I will talk to the doctor about"  EMS vitals: 165/90 b/p 88HR

## 2023-07-29 NOTE — ED Provider Notes (Signed)
 [facility] Provider Note    Event Date/Time   First MD Initiated Contact with Patient 07/29/23 1421     (approximate)   History   Eye Problem   HPI  Diamond Lucas is a 63 y.o. female   presents to the ED via EMS after getting a new shampoo in her left eye.  Patient states that she rinsed it out but continued to have burning in her left eye.  She called EMS who again rinsed out her eyes with saline.  Patient states that is proved some while sitting in the emergency department.  Patient has a history of hypertension, thyroid  disease, schizophrenia, bipolar disorder.      Physical Exam   Triage Vital Signs: ED Triage Vitals [07/29/23 1133]  Encounter Vitals Group     BP (!) 158/93     Systolic BP Percentile      Diastolic BP Percentile      Pulse Rate 81     Resp 18     Temp 98 F (36.7 C)     Temp Source Oral     SpO2 97 %     Weight      Height      Head Circumference      Peak Flow      Pain Score      Pain Loc      Pain Education      Exclude from Growth Chart     Most recent vital signs: Vitals:   07/29/23 1133 07/29/23 1142  BP: (!) 158/93   Pulse: 81 76  Resp: 18 18  Temp: 98 F (36.7 C) 98 F (36.7 C)  SpO2: 97%      General: Awake, no distress.  Talkative, cooperative. CV:  Good peripheral perfusion.  Resp:  Normal effort.  Abd:  No distention.  Other:  Left conjunctiva minimally injected.  No drainage.  PERRLA, EOMI's.  Tetracaine was applied along with fluorescein stain.  There is a corneal abrasion present at approximately 7 to 8 o'clock position.   ED Results / Procedures / Treatments   Labs (all labs ordered are listed, but only abnormal results are displayed) Labs Reviewed - No data to display    PROCEDURES:  Critical Care performed:   Procedures   MEDICATIONS ORDERED IN ED: Medications  tetracaine (PONTOCAINE) 0.5 % ophthalmic solution 2 drop (2 drops Left Eye Given by Other 07/29/23 1147)      IMPRESSION / MDM / ASSESSMENT AND PLAN / ED COURSE  I reviewed the triage vital signs and the nursing notes.   Differential diagnosis includes, but is not limited to, chemical conjunctivitis, allergic conjunctivitis, corneal abrasion.  63 year old female presents to the ED with complaint of getting shampoo in her left eye this morning.  Patient had her eye irrigated twice prior to being seen due to the wait time in the ED.  Patient was seen in triage extension and after examination told that she had a corneal abrasion.  Patient states that she will be able to get her eyedrops at the pharmacy.  She was told to follow-up with Dr. Ignatius Makos if not improving in 24 hours.  Patient reports improvement prior to discharge.     Patient's presentation is most consistent with acute, uncomplicated illness.  FINAL CLINICAL IMPRESSION(S) / ED DIAGNOSES   Final diagnoses:  Abrasion of left cornea, initial encounter     Rx / DC Orders   ED Discharge Orders  Ordered    tobramycin (TOBREX) 0.3 % ophthalmic solution  Every 4 hours        07/29/23 1432             Note:  This document was prepared using Dragon voice recognition software and may include unintentional dictation errors.   Stafford Eagles, PA-C 07/29/23 1449    Lind Repine, MD 07/29/23 [phone]

## 2023-07-29 NOTE — Discharge Instructions (Signed)
 Follow up with DR. Ignatius Makos if any continued problems or not improving in 24 hours.   Use eye drops as directed.   You may need to wear sunglasses if outside to protect your eyes while this is healing

## 2023-07-29 NOTE — ED Triage Notes (Signed)
 Pt to ED from home with c/o left eye pain after getting Cinnamon Oil into her eye while washing her hair this morning. EMS rinsed out her eyes PTA.

## 2023-07-31 ENCOUNTER — Encounter: Payer: Self-pay | Admitting: Gastroenterology

## 2023-07-31 DIAGNOSIS — F411 Generalized anxiety disorder: Secondary | ICD-10-CM | POA: Diagnosis not present

## 2023-07-31 DIAGNOSIS — F209 Schizophrenia, unspecified: Secondary | ICD-10-CM | POA: Diagnosis not present

## 2023-07-31 DIAGNOSIS — F324 Major depressive disorder, single episode, in partial remission: Secondary | ICD-10-CM | POA: Diagnosis not present

## 2023-08-04 ENCOUNTER — Other Ambulatory Visit: Payer: Self-pay

## 2023-08-04 ENCOUNTER — Other Ambulatory Visit (HOSPITAL_COMMUNITY)
Admission: RE | Admit: 2023-08-04 | Discharge: 2023-08-04 | Disposition: A | Discharge status: Home / Self Care | Source: Ambulatory Visit

## 2023-08-04 ENCOUNTER — Ambulatory Visit (INDEPENDENT_AMBULATORY_CARE_PROVIDER_SITE_OTHER)

## 2023-08-04 VITALS — BP 122/84 | HR 85 | Ht 61.0 in | Wt 136.3 lb

## 2023-08-04 DIAGNOSIS — N760 Acute vaginitis: Secondary | ICD-10-CM | POA: Diagnosis not present

## 2023-08-04 DIAGNOSIS — N898 Other specified noninflammatory disorders of vagina: Secondary | ICD-10-CM

## 2023-08-04 DIAGNOSIS — R300 Dysuria: Secondary | ICD-10-CM | POA: Insufficient documentation

## 2023-08-04 LAB — POCT URINALYSIS DIPSTICK
Bilirubin, UA: NEGATIVE
Blood, UA: NEGATIVE
Glucose, UA: NEGATIVE
Ketones, UA: NEGATIVE
Leukocytes, UA: NEGATIVE
Nitrite, UA: NEGATIVE
Protein, UA: NEGATIVE
Spec Grav, UA: 1.01 (ref 1.010–1.025)
Urobilinogen, UA: 0.2 U/dL
pH, UA: 7 (ref 5.0–8.0)

## 2023-08-04 MED ORDER — FLUCONAZOLE 150 MG PO TABS: 150.0000 mg | ORAL_TABLET | Freq: Once | ORAL | 0 refills | Status: AC

## 2023-08-04 NOTE — Addendum Note (Signed)
 Addended by: Juanita Norlander on: 08/04/2023 02:02 PM   Modules accepted: Orders

## 2023-08-04 NOTE — Progress Notes (Signed)
    NURSE VISIT NOTE  Subjective:    Patient ID: Diamond Lucas, female    DOB: 26-Jul-1960, 63 y.o.   MRN: [id number]  HPI  Patient is a 63 y.o. G0P0000 female who presents for white, "yeasty" vaginal discharge with itching for 7 day(s). Denies abnormal vaginal bleeding or fever.  Does report right sided pelvic pain for 7 days.   Also mentions dysuria but unsure of duration.  Patient denies history of known exposure to STD.  Patient also concerned about a baby in her uterus causing right sided pelvic pain.  Advised she is unable to be pregnant given her age and postmenopausal status.  She requests ultrasound today to determine why the baby is hurting her.  Advised this would need a provider order based on medical need.  She requests an appointment to discuss with a provider.     Objective:    Postmenopausal  Assessment:   nonspecific vaginitis, possible yeast infection  Plan:   POC urinalysis - all WNL.  No need for culture.  Aptima swab sent to lab for GC, CT, Trich, BV and yeast testing.  Treatment:  Consulted with Fred Jacobsen CNM.  Diflucan sent to pt pharmacy.  Patient advised we will call if test results indicate need for further treatment.  Counseled on avoiding harsh soaps and irritants than may worsen vaginal discomfort.  ROV prn if symptoms persist or worsen.  Schedule appointment at check-out for Gyn visit.   Juanita Norlander, RN

## 2023-08-07 ENCOUNTER — Encounter: Payer: Self-pay | Admitting: Family Medicine

## 2023-08-07 ENCOUNTER — Ambulatory Visit (INDEPENDENT_AMBULATORY_CARE_PROVIDER_SITE_OTHER): Admitting: Family Medicine

## 2023-08-07 VITALS — BP 129/70 | HR 69 | Ht 61.0 in | Wt 134.0 lb

## 2023-08-07 DIAGNOSIS — B850 Pediculosis due to Pediculus humanus capitis: Secondary | ICD-10-CM

## 2023-08-07 MED ORDER — IVERMECTIN 3 MG PO TABS: 200.0000 ug/kg | ORAL_TABLET | ORAL | 0 refills | Status: DC

## 2023-08-07 NOTE — Progress Notes (Signed)
 Established patient visit   Patient: Diamond Lucas   DOB: June 30, 1960   62 y.o. Female  MRN: [id number] Visit Date: 08/07/2023  Today's healthcare provider: Mimi Alt, MD   Chief Complaint  Patient presents with   Head Lice   Subjective       Discussed the use of AI scribe software for clinical note transcription with the patient, who gave verbal consent to proceed.  History of Present Illness Diamond Lucas is a 63 year old female who presents with concerns of lice infestation involving her hair, eyebrows, and skin.  She experiences itching at the base of her eyelashes and is worried about lice breeding and laying eggs in her lashes. She has tearing and silver eggs in the corner of her eye, which is itchy. This morning, she developed yellow discharge in the inner corner of her left eye, despite using tobramycin . She describes a prickling sensation and a crawling feeling under her eyes.  She has been seen by ophthalmology and reports a thin coating on her scalp, raising concerns about lice. She has used a knit comb and considered cutting her hair short due to the infestation. She has been treated with shampoos for her scalp and has taken Zyrtec for her symptoms. She has previously taken one dose of ivermectin  and believes a second dose is necessary. She washed her hair with an alternative shampoo containing cinnamon oil, which led to a corneal abrasion, and she is currently using tobramycin  for this.  She experiences a sensation of lice crawling in her bra area and a fluttering sensation related to a uterine condition. She has continued pain in her left foot and is concerned about being contagious, which has delayed her visit to a podiatrist.  Her past medical history includes paranoid schizophrenia, hypertension, colon polyps, HPV, and bipolar I disorder. She is currently taking anti-inflammatory medication for her foot pain.  In the review of  systems, she reports itching in the corners of her eyes, particularly the outer corners, and sensations in her eyelashes. She has a dry cough and intermittent throat dryness. No visible lice movement, but she has observed tiny, round nits.     Past Medical History:  Diagnosis Date   Allergy    Arrhythmia    Cervical radiculopathy 03/09/2021   History of chicken pox    History of measles    History of syncope    Hypertension    Osteoarthritis    Palpitations    Pure hypercholesterolemia    Rosacea    Schizophrenia (HCC)    Thyroid  disease     Medications: Outpatient Medications Prior to Visit  Medication Sig   amLODipine  (NORVASC ) 5 MG tablet TAKE 1 TABLET(5 MG) BY MOUTH DAILY   benzonatate  (TESSALON ) 100 MG capsule Take 1 capsule (100 mg total) by mouth 2 (two) times daily as needed for cough.   CALCIUM  CITRATE PO Take by mouth.   Carboxymeth-Glyc-Polysorb PF 0.5-1-0.5 % SOLN Apply 2 drops to eye 2 (two) times daily.   cyclobenzaprine  (FLEXERIL ) 5 MG tablet Take 1 tablet (5 mg total) by mouth daily as needed for muscle spasms.   Ibuprofen  (MOTRIN  PO) Take by mouth as needed (liquid).   Levothyroxine  Sodium (TIROSINT ) 50 MCG CAPS Take 75 mcg by mouth daily.   Multiple Vitamin (MULTI-VITAMINS) TABS daily.    Omega-3 Fatty Acids (FISH OIL) 1000 MG CAPS Take by mouth. Takes occasionally due to causing acid reflux   permethrin  (ELIMITE ) 5 %  cream Apply to damp hair that has just been shampooed with a nonconditioning shampoo; saturate hair and scalp beginning behind the ears and at back of neck; leave on 10 minutes; rinse with warm water; remove nits with nit comb; repeat application if live lice present 7 days after initial treatment   tobramycin  (TOBREX ) 0.3 % ophthalmic solution Place 2 drops into the left eye every 4 (four) hours. While awake   triamcinolone  (NASACORT ) 55 MCG/ACT AERO nasal inhaler Place 2 sprays into the nose as needed. Once daily as needed.   UNABLE TO FIND Take  1 capsule by mouth 2 (two) times daily. Med Name: VENEASE   No facility-administered medications prior to visit.    Review of Systems      Objective    BP 129/70   Pulse 69   Ht 5\' 1"  (1.549 m)   Wt 134 lb (60.8 kg)   LMP 03/29/2015   SpO2 100%   BMI 25.32 kg/m  BP Readings from Last 3 Encounters:  08/07/23 129/70  08/04/23 122/84  07/29/23 (!) 158/93   Wt Readings from Last 3 Encounters:  08/07/23 134 lb (60.8 kg)  08/04/23 136 lb 4.8 oz (61.8 kg)  07/27/23 131 lb 12.8 oz (59.8 kg)        Physical Exam  Scalp: did not visualize nits/crawling lice, Eyes: left eye appears irritated with erythematous inferior lids, no nits/crawling lice visualized on exam   No results found for any visits on 08/07/23.  Assessment & Plan     Problem List Items Addressed This Visit   None Visit Diagnoses       Lice infested hair    -  Primary   Relevant Medications   ivermectin  (STROMECTOL ) 3 MG TABS tablet       Assessment & Plan Lice infestation Persistent lice infestation involving scalp, eyebrows, and eyelashes. Previous treatment with shampoos and one dose of ivermectin . Reports pruritus and sensation of crawling. Ophthalmologist confirmed presence of nits. No active lice observed on examination. Medical decision to prescribe a second dose of ivermectin  due to persistent symptoms and previous partial treatment. - Prescribe ivermectin , 4 tablets today and 4 tablets next Monday. - Send prescription to [facility]. - Advise follow-up with ophthalmologist for eye symptoms. - Recommend continued use of lice comb and consider haircut for management.  Conjunctivitis Conjunctivitis in the left eye with yellow discharge and pruritus, possibly secondary to corneal abrasion from shampoo containing cinnamon oil. Symptoms persist despite tobramycin  treatment. Left eye appears more irritated than the right. - Advise follow-up with ophthalmologist for further evaluation  and management of conjunctivitis. - Recommend use of artificial tears for symptomatic relief.     No follow-ups on file.         Mimi Alt, MD  [facility] [phone] (phone) [phone] (fax)  [facility]

## 2023-08-08 ENCOUNTER — Telehealth: Payer: Self-pay

## 2023-08-08 LAB — CERVICOVAGINAL ANCILLARY ONLY
Bacterial Vaginitis (gardnerella): NEGATIVE
Candida Glabrata: NEGATIVE
Candida Vaginitis: NEGATIVE
Chlamydia: NEGATIVE
Comment: NEGATIVE
Comment: NEGATIVE
Comment: NEGATIVE
Comment: NEGATIVE
Comment: NEGATIVE
Comment: NORMAL
Neisseria Gonorrhea: NEGATIVE
Trichomonas: NEGATIVE

## 2023-08-08 NOTE — Telephone Encounter (Signed)
 I do not recommend taking the second dose sooner than instructed during visit yesterday.

## 2023-08-08 NOTE — Telephone Encounter (Signed)
Please see the pt message below

## 2023-08-08 NOTE — Telephone Encounter (Signed)
 LMTCB, [facility] Triage Nurse may give patient results/message

## 2023-08-08 NOTE — Telephone Encounter (Signed)
 Copied from CRM [id number]. Topic: General - Other >> Aug 08, 2023  8:11 AM Emylou G wrote: Reason for CRM: Patient called said per the appt.. she took the ivermectin  (STROMECTOL ) 3 MG TABS tablet - said it didn't work wants to know if is to take the rest today??  Pls call her number on file is good

## 2023-08-09 NOTE — Telephone Encounter (Signed)
 Please see the message below.

## 2023-08-10 ENCOUNTER — Other Ambulatory Visit: Payer: Self-pay | Admitting: Family Medicine

## 2023-08-10 DIAGNOSIS — B850 Pediculosis due to Pediculus humanus capitis: Secondary | ICD-10-CM

## 2023-08-10 MED ORDER — PERMETHRIN 5 % EX CREA: TOPICAL_CREAM | CUTANEOUS | 2 refills | Status: DC

## 2023-08-10 NOTE — Progress Notes (Signed)
 Pt requested additional permethrin  cream.  Refill sent

## 2023-08-11 DIAGNOSIS — L539 Erythematous condition, unspecified: Secondary | ICD-10-CM | POA: Diagnosis not present

## 2023-08-11 DIAGNOSIS — H01003 Unspecified blepharitis right eye, unspecified eyelid: Secondary | ICD-10-CM | POA: Diagnosis not present

## 2023-08-14 ENCOUNTER — Other Ambulatory Visit: Payer: Self-pay | Admitting: Family Medicine

## 2023-08-14 DIAGNOSIS — B850 Pediculosis due to Pediculus humanus capitis: Secondary | ICD-10-CM

## 2023-08-14 NOTE — Telephone Encounter (Signed)
 Please see the message below.

## 2023-08-14 NOTE — Telephone Encounter (Signed)
 Copied from CRM [id number]. Topic: Clinical - Medication Refill >> Aug 14, 2023  1:52 PM Carlatta H wrote: Medication: ivermectin  (STROMECTOL ) 3 MG TABS tablet [[id number]  Has the patient contacted their pharmacy? No (Agent: If no, request that the patient contact the pharmacy for the refill. If patient does not wish to contact the pharmacy document the reason why and proceed with request.) (Agent: If yes, when and what did the pharmacy advise?)  This is the patient's preferred pharmacy:    [id number] [place], [place] - [address] [place] [address] Phone: [phone] Fax: [phone]  Is this the correct pharmacy for this prescription? Yes If no, delete pharmacy and type the correct one.   Has the prescription been filled recently? No  Is the patient out of the medication? Yes  Has the patient been seen for an appointment in the last year OR does the patient have an upcoming appointment? Yes  Can we respond through MyChart? Yes  Agent: Please be advised that Rx refills may take up to 3 business days. We ask that you follow-up with your pharmacy.

## 2023-08-15 ENCOUNTER — Ambulatory Visit: Payer: Self-pay

## 2023-08-15 NOTE — Telephone Encounter (Signed)
 Chief Complaint: Requesting a larger dose of ivermectin  due to symptoms of lice not improving and spreading to vulva  Symptoms: pain to eyes 10/10, pain to vulva 7/10, fatigue, dry cough  Frequency: Ongoing Pertinent Negatives: Patient denies other symptoms Disposition: [] ED /[] Urgent Care (no appt availability in office) / [] Appointment(In office/virtual)/ []  [place] [facility]/ [] Home Care/ [] Refused Recommended Disposition /[] [facility]/ [x]  Follow-up with PCP  Additional Notes: Patient says she took one dose as instructed on 08/07/23, but is now not able to locate the bottle for her 2nd dose due on yesterday. She says where she lives maintenance men come in and out and they may have taken it, she's not sure. She says the symptoms are worse as far as the lice bites that she feels and they spreading now to the vulva. She says if the doctor could increase the dosage, if not she will stick with the 12 mg, but will need the dosage for this week. She would like the new Rx to [facility]. This may need a provider override to refill early due to lost pills. Advised I will send this to Dr. Verdia Glad who saw patient on 5/12.  [id number] [place] - [address] [place] [address] Phone: [phone] Fax: [phone]  Copied from CRM [id number]. Topic: Clinical - Medical Advice >> Aug 15, 2023  9:17 AM Hassie Lint wrote: Reason for CRM: Patient is waiting a refill request for  ivermectin  (STROMECTOL ) 3 MG TABS tablet. States the lice has moved to her eyes and is in need of advice on what she can do until the refill is completed.  Patient can be reached at [phone] or a mychart message can be sent. Reason for Disposition  [1] Caller has NON-URGENT question (includes prescribed medication questions) AND [2] triager unable to answer  Answer Assessment - Initial Assessment Questions 1. MAIN CONCERN OR SYMPTOM:  "What is your main concern  right now?" "What question do you have?" "What's the main symptom you're worried about?" (e.g., breathing difficulty, cough, fever. pain)     Lice breakout not improved  2. BETTER-SAME-WORSE: "Are you getting better, staying the same, or getting worse compared to how you felt at your last visit to the doctor (most recent medical visit)?"     Worse-more in the vulva area 3. VISIT DATE: "When were you seen?" (Date)     08/07/23 4. VISIT DOCTOR: "What is the name of the doctor taking care of you now?"     Dr. Verdia Glad 5. VISIT DIAGNOSIS:  "What was the main symptom or problem that you were seen for?" "Were you given a diagnosis?"      Lice 6. VISIT MEDICINES: "Did the doctor order any new medicines for you to use?" If Yes, ask: "Have you filled the prescription and started taking the medicine?"      ivermectin  7. NEXT APPOINTMENT: "Have you scheduled a follow-up appointment with your doctor?"     No  8. PAIN: "Is there any pain?" If Yes, ask: "How bad is it?"  (Scale 0-10; or mild, moderate, severe)    - NONE (0): no pain    - MILD (1-3): doesn't interfere with normal activities     - MODERATE (4-7): interferes with normal activities or awakens from sleep     - SEVERE (8-10): excruciating pain, unable to do any normal activities     10 to the eyes; 7-8 to the vulva  7. FEVER: "Do you have a fever?" If Yes, ask: "What is it, how was it measured  and when did it start?"       No 8. OTHER SYMPTOMS: "Do you have any other symptoms?"       Visibly seeing black dots, fatigued, dry cough  Protocols used: Recent Medical Visit for Illness Follow-up Call-A-[facility]

## 2023-08-15 NOTE — Telephone Encounter (Signed)
 Requested medication (s) are due for refill today: Yes  Requested medication (s) are on the active medication list: Yes  Last refill:  08/07/23  Future visit scheduled: Yes  Notes to clinic:  Unable to refill due to no refill protocol for this medication.      Requested Prescriptions  Pending Prescriptions Disp Refills   ivermectin  (STROMECTOL ) 3 MG TABS tablet 8 tablet 0    Sig: Take 4 tablets (12,000 mcg total) by mouth once a week. For 2 weeks     Off-Protocol Failed - 08/15/2023 11:08 AM      Failed - Medication not assigned to a protocol, review manually.      Failed - Valid encounter within last 12 months    Recent Outpatient Visits           1 week ago Lice infested hair   [facility] [place], [place], MD   1 month ago Acute cough   [facility] [place], Judyann Number, MD       Future Appointments             In 2 months Elta Halter, MD [facility]

## 2023-08-15 NOTE — Telephone Encounter (Signed)
 Patient is asking for a larger dose if possible, see nurse triage notes.

## 2023-08-15 NOTE — Telephone Encounter (Signed)
 I do not recommend additional ivermectin  dosing given that the patient has been treated with multiple agents for this concern of lice.   I will not provide an additional prescription at this time  I will respond to patient's MyChart message.

## 2023-08-15 NOTE — Telephone Encounter (Signed)
 Called and was able to inform the pt of the message per Dr R, she verbally stated she would try to use what she got on hand and try back next week.

## 2023-08-24 DIAGNOSIS — F411 Generalized anxiety disorder: Secondary | ICD-10-CM | POA: Diagnosis not present

## 2023-08-24 DIAGNOSIS — F324 Major depressive disorder, single episode, in partial remission: Secondary | ICD-10-CM | POA: Diagnosis not present

## 2023-08-24 DIAGNOSIS — F209 Schizophrenia, unspecified: Secondary | ICD-10-CM | POA: Diagnosis not present

## 2023-08-31 DIAGNOSIS — H01003 Unspecified blepharitis right eye, unspecified eyelid: Secondary | ICD-10-CM | POA: Diagnosis not present

## 2023-08-31 DIAGNOSIS — B880 Other acariasis: Secondary | ICD-10-CM | POA: Diagnosis not present

## 2023-08-31 DIAGNOSIS — H04123 Dry eye syndrome of bilateral lacrimal glands: Secondary | ICD-10-CM | POA: Diagnosis not present

## 2023-08-31 DIAGNOSIS — L539 Erythematous condition, unspecified: Secondary | ICD-10-CM | POA: Diagnosis not present

## 2023-09-06 ENCOUNTER — Other Ambulatory Visit: Payer: Self-pay | Admitting: Family Medicine

## 2023-09-06 ENCOUNTER — Ambulatory Visit: Payer: Medicare Other

## 2023-09-06 DIAGNOSIS — I10 Essential (primary) hypertension: Secondary | ICD-10-CM

## 2023-09-06 DIAGNOSIS — Z0000 Encounter for general adult medical examination without abnormal findings: Secondary | ICD-10-CM | POA: Diagnosis not present

## 2023-09-06 NOTE — Progress Notes (Addendum)
 Diamond Farr, FNP   Chief Complaint  Patient presents with   Pelvic Pain    Right side    HPI:      [name] is a 63 y.o. G0P0000 whose LMP was Patient's last menstrual period was 03/29/2015., presents today for RLQ pain for the past 6 wks. Pain is random and fleeting (pt can't qualify pain sensation, lasts a few seconds up to a few hrs), triggered by movements from the bats in her uterus. Pt states baby Jesus' LT hand is hurting and she is worried it has now detatched. No aggrav/allev factory. 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 Would like an u/s. No vag/urin/GI sx. Has BM twice daily which is normal for pt. No PMB. Not sexually active. Pt taking motrin  for achilles tendonitis without relief of RLQ pains. Neg pap/neg HPV DNA 3/24. Pt is s/p colonoscopy 07/27/23 with polyp, repeat due after 10 yrs.  Mammo due 9/24, pt declines this yr.   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   Bipolar I disorder, most recent episode manic, severe with psychotic features (HCC) 02/17/2016   Delusional disorder, somatic type (HCC) 10/07/2015   HTN (hypertension) 10/07/2015    Past Surgical History:  Procedure Laterality Date   COLONOSCOPY WITH PROPOFOL  N/A 02/08/2018   Procedure: COLONOSCOPY WITH PROPOFOL ;  Surgeon: Irby Mannan, MD;  Location:  [facility] ENDOSCOPY;  Service: Endoscopy;  Laterality: N/A;   COLONOSCOPY WITH PROPOFOL  N/A 07/27/2023   Procedure: COLONOSCOPY WITH PROPOFOL ;  Surgeon: Selena Daily, MD;  Location[facility] ENDOSCOPY;  Service: Gastroenterology;  Laterality: N/A;   FOOT SURGERY Right    POLYPECTOMY  07/27/2023   Procedure: POLYPECTOMY, INTESTINE;  Surgeon: Selena Daily, MD;  Location: [facility] ENDOSCOPY;  Service: Gastroenterology;;   THYROIDECTOMY      Family History  Problem Relation Age of Onset   Depression Mother    Cataracts Mother    Stroke Mother        mini   Kidney cancer Father    Anxiety disorder Brother    Cancer Other        unknown cancer type   Heart failure Other    Heart disease Other    Stroke Other    Diabetes Other     Social History   Socioeconomic History   Marital status: Single    Spouse name: Not on file   Number of children: 0   Years of education: Not on file   Highest education level: Bachelor's degree (e.g., BA, AB, BS)  Occupational History   Occupation: retired  Tobacco Use   Smoking status: Never    Passive exposure: Never   Smokeless tobacco: Never  Vaping Use   Vaping status: Never Used  Substance and Sexual Activity   Alcohol use: No   Drug use: No   Sexual activity: Not Currently  Birth control/protection: Post-menopausal, Abstinence  Other Topics Concern   Not on file  Social History Narrative   Not on file   Social Drivers of Health   Financial Resource Strain: Medium Risk (09/06/2023)   Overall Financial Resource Strain (CARDIA)    Difficulty of Paying Living Expenses: Somewhat hard  Food Insecurity: Food Insecurity Present (09/06/2023)   Hunger Vital Sign    Worried About Running Out of Food in the Last Year: Sometimes true    Ran Out of Food in the Last Year: Never true  Transportation Needs: No Transportation Needs (09/06/2023)   PRAPARE - Administrator, Civil Service (Medical): No    Lack of Transportation  (Non-Medical): No  Physical Activity: Inactive (09/06/2023)   Exercise Vital Sign    Days of Exercise per Week: 0 days    Minutes of Exercise per Session: 0 min  Stress: No Stress Concern Present (09/06/2023)   [employer] of Occupational Health - Occupational Stress Questionnaire    Feeling of Stress : Only a little  Social Connections: Moderately Isolated (09/06/2023)   Social Connection and Isolation Panel    Frequency of Communication with Friends and Family: Once a week    Frequency of Social Gatherings with Friends and Family: Never    Attends Religious Services: More than 4 times per year    Active Member of [employer] or Organizations: Yes    Attends Engineer, structural: More than 4 times per year    Marital Status: Divorced  Intimate Partner Violence: Not At Risk (09/06/2023)   Humiliation, Afraid, Rape, and Kick questionnaire    Fear of Current or Ex-Partner: No    Emotionally Abused: No    Physically Abused: No    Sexually Abused: No    Outpatient Medications Prior to Visit  Medication Sig Dispense Refill   amLODipine  (NORVASC ) 5 MG tablet TAKE 1 TABLET(5 MG) BY MOUTH DAILY 90 tablet 0   CALCIUM  CITRATE PO Take by mouth.     Carboxymeth-Glyc-Polysorb PF 0.5-1-0.5 % SOLN Apply 2 drops to eye 2 (two) times daily.     cyclobenzaprine  (FLEXERIL ) 5 MG tablet Take 1 tablet (5 mg total) by mouth daily as needed for muscle spasms. 90 tablet 0   Ibuprofen  (MOTRIN  PO) Take by mouth as needed (liquid).     Levothyroxine  Sodium (TIROSINT ) 50 MCG CAPS Take 75 mcg by mouth daily.     Multiple Vitamin (MULTI-VITAMINS) TABS daily.      Omega-3 Fatty Acids (FISH OIL) 1000 MG CAPS Take by mouth. Takes occasionally due to causing acid reflux     triamcinolone  (NASACORT ) 55 MCG/ACT AERO nasal inhaler Place 2 sprays into the nose as needed. Once daily as needed.     UNABLE TO FIND Take 1 capsule by mouth 2 (two) times daily. Med Name: VENEASE     ivermectin  (STROMECTOL ) 3 MG TABS  tablet Take 4 tablets (12,000 mcg total) by mouth once a week. For 2 weeks (Patient not taking: Reported on 09/07/2023) 8 tablet 0   permethrin  (ELIMITE ) 5 % cream Apply to damp hair that has just been shampooed with a nonconditioning shampoo; saturate hair and scalp beginning behind the ears and at back of neck; leave on 10 minutes; rinse with warm water; remove nits with nit comb; repeat application if live lice present 7 days after initial treatment (Patient not taking: Reported on 09/07/2023) 60 g 2   tobramycin  (TOBREX ) 0.3 % ophthalmic solution Place 2 drops into the left  eye every 4 (four) hours. While awake (Patient not taking: Reported on 09/07/2023) 5 mL 0   No facility-administered medications prior to visit.      ROS:  Review of Systems  Constitutional:  Negative for fever.  Gastrointestinal:  Negative for blood in stool, constipation, diarrhea, nausea and vomiting.  Genitourinary:  Positive for pelvic pain. Negative for dyspareunia, dysuria, flank pain, frequency, hematuria, urgency, vaginal bleeding, vaginal discharge and vaginal pain.  Musculoskeletal:  Negative for back pain.  Skin:  Negative for rash.   BREAST: No symptoms   OBJECTIVE:   Vitals:  BP 127/80   Pulse 69   Ht 5' 1 (1.549 m)   Wt 135 lb (61.2 kg)   LMP 03/29/2015   BMI 25.51 kg/m   Physical Exam Vitals reviewed.  Constitutional:      Appearance: She is well-developed.  Pulmonary:     Effort: Pulmonary effort is normal.  Genitourinary:    General: Normal vulva.     Pubic Area: No rash.      Labia:        Right: No rash, tenderness or lesion.        Left: No rash, tenderness or lesion.      Vagina: Normal. No vaginal discharge, erythema or tenderness.     Cervix: Normal.     Uterus: Normal. Tender. Not enlarged.      Adnexa:        Right: Tenderness present. No mass.         Left: Tenderness present. No mass.     Musculoskeletal:        General: Normal range of motion.     Cervical back:  Normal range of motion.   Skin:    General: Skin is warm and dry.   Neurological:     General: No focal deficit present.     Mental Status: She is alert and oriented to person, place, and time.   Psychiatric:        Mood and Affect: Mood normal.        Behavior: Behavior normal.        Thought Content: Thought content normal.        Judgment: Judgment normal.    Assessment/Plan: RLQ abdominal pain - Plan: US  PELVIS TRANSVAGINAL NON-OB (TV ONLY); check GYN u/s. Will f/u with results. If neg, most likely GI related since sx started a day or so after colonoscopy. F/u prn.     Return in about 2 days (around 09/09/2023) for GYN u/s for RLQ pain--[facility] to call with results.  Oprah Camarena B. Shalandra Leu, PA-C 09/07/2023 11:28 AM

## 2023-09-06 NOTE — Progress Notes (Signed)
 Subjective:   Diamond Lucas is a 63 y.o. who presents for a Medicare Wellness preventive visit.  As a reminder, Annual Wellness Visits don't include a physical exam, and some assessments may be limited, especially if this visit is performed virtually. We may recommend an in-person follow-up visit with your provider if needed.  Visit Complete: Virtual I connected with  Diamond Lucas on 09/06/23 by a audio enabled telemedicine application and verified that I am speaking with the correct person using two identifiers.  Patient Location: Home  Provider Location: Home Office  I discussed the limitations of evaluation and management by telemedicine. The patient expressed understanding and agreed to proceed.  Vital Signs: Because this visit was a virtual/telehealth visit, some criteria may be missing or patient reported. Any vitals not documented were not able to be obtained and vitals that have been documented are patient reported.  VideoDeclined- This patient declined Librarian, academic. Therefore the visit was completed with audio only.  Persons Participating in Visit: Patient.  AWV Questionnaire: No: Patient Medicare AWV questionnaire was not completed prior to this visit.  Cardiac Risk Factors include: advanced age (>33men, >48 women);hypertension;sedentary lifestyle     Objective:     Today's Vitals   09/06/23 1052  PainSc: 4    There is no height or weight on file to calculate BMI.     09/06/2023   11:00 AM 07/29/2023   11:44 AM 07/27/2023    8:25 AM 09/05/2022   11:23 AM 07/05/2021   11:13 AM 08/12/2019    1:46 PM 02/08/2018    8:13 AM  Advanced Directives  Does Patient Have a Medical Advance Directive? No No No Yes No Yes No  Type of Dispensing optician of [place];Living will   Copy of Healthcare Power of Attorney in Chart?      Yes - validated most recent copy scanned in chart (See row information)   Would  patient like information on creating a medical advance directive? No - Patient declined  No - Patient declined  No - Patient declined      Current Medications (verified) Outpatient Encounter Medications as of 09/06/2023  Medication Sig   amLODipine  (NORVASC ) 5 MG tablet TAKE 1 TABLET(5 MG) BY MOUTH DAILY   CALCIUM  CITRATE PO Take by mouth.   Carboxymeth-Glyc-Polysorb PF 0.5-1-0.5 % SOLN Apply 2 drops to eye 2 (two) times daily.   cyclobenzaprine  (FLEXERIL ) 5 MG tablet Take 1 tablet (5 mg total) by mouth daily as needed for muscle spasms.   Ibuprofen  (MOTRIN  PO) Take by mouth as needed (liquid).   Levothyroxine  Sodium (TIROSINT ) 50 MCG CAPS Take 75 mcg by mouth daily.   Multiple Vitamin (MULTI-VITAMINS) TABS daily.    Omega-3 Fatty Acids (FISH OIL) 1000 MG CAPS Take by mouth. Takes occasionally due to causing acid reflux   triamcinolone  (NASACORT ) 55 MCG/ACT AERO nasal inhaler Place 2 sprays into the nose as needed. Once daily as needed.   UNABLE TO FIND Take 1 capsule by mouth 2 (two) times daily. Med Name: VENEASE   ivermectin  (STROMECTOL ) 3 MG TABS tablet Take 4 tablets (12,000 mcg total) by mouth once a week. For 2 weeks (Patient not taking: Reported on 09/06/2023)   permethrin  (ELIMITE ) 5 % cream Apply to damp hair that has just been shampooed with a nonconditioning shampoo; saturate hair and scalp beginning behind the ears and at back of neck; leave on 10 minutes; rinse with warm water;  remove nits with nit comb; repeat application if live lice present 7 days after initial treatment (Patient not taking: Reported on 09/06/2023)   tobramycin  (TOBREX ) 0.3 % ophthalmic solution Place 2 drops into the left eye every 4 (four) hours. While awake (Patient not taking: Reported on 09/06/2023)   [DISCONTINUED] benzonatate  (TESSALON ) 100 MG capsule Take 1 capsule (100 mg total) by mouth 2 (two) times daily as needed for cough. (Patient not taking: Reported on 09/06/2023)   No facility-administered encounter  medications on file as of 09/06/2023.    Allergies (verified) Diclofenac sodium, Naproxen, Olanzapine , Gluten meal, Mite (d. farinae), Nsaids, Other, Risperidone , and Synthroid  [levothyroxine ]   History: Past Medical History:  Diagnosis Date   Allergy    Arrhythmia    Cervical radiculopathy 03/09/2021   History of chicken pox    History of measles    History of syncope    Hypertension    Osteoarthritis    Palpitations    Pure hypercholesterolemia    Rosacea    Schizophrenia (HCC)    Thyroid  disease    Past Surgical History:  Procedure Laterality Date   COLONOSCOPY WITH PROPOFOL  N/A 02/08/2018   Procedure: COLONOSCOPY WITH PROPOFOL ;  Surgeon: Irby Mannan, MD;  Location: [facility] ENDOSCOPY;  Service: Endoscopy;  Laterality: N/A;   COLONOSCOPY WITH PROPOFOL  N/A 07/27/2023   Procedure: COLONOSCOPY WITH PROPOFOL ;  Surgeon: Selena Daily, MD;  Location: [facility] ENDOSCOPY;  Service: Gastroenterology;  Laterality: N/A;   FOOT SURGERY Right    POLYPECTOMY  07/27/2023   Procedure: POLYPECTOMY, INTESTINE;  Surgeon: Selena Daily, MD;  Location: [facility] ENDOSCOPY;  Service: Gastroenterology;;   THYROIDECTOMY     Family History  Problem Relation Age of Onset   Depression Mother    Cataracts Mother    Stroke Mother        mini   Kidney cancer Father    Anxiety disorder Brother    Cancer Other        unknown cancer type   Heart failure Other    Heart disease Other    Stroke Other    Diabetes Other    Social History   Socioeconomic History   Marital status: Single    Spouse name: Not on file   Number of children: 0   Years of education: Not on file   Highest education level: Bachelor's degree (e.g., BA, AB, BS)  Occupational History   Occupation: retired  Tobacco Use   Smoking status: Never    Passive exposure: Never   Smokeless tobacco: Never  Vaping Use   Vaping status: Never Used  Substance and Sexual Activity   Alcohol use: No   Drug use: No   Sexual  activity: Not Currently    Birth control/protection: Post-menopausal, Abstinence  Other Topics Concern   Not on file  Social History Narrative   Not on file   Social Drivers of Health   Financial Resource Strain: Medium Risk (09/06/2023)   Overall Financial Resource Strain (CARDIA)    Difficulty of Paying Living Expenses: Somewhat hard  Food Insecurity: Food Insecurity Present (09/06/2023)   Hunger Vital Sign    Worried About Running Out of Food in the Last Year: Sometimes true    Ran Out of Food in the Last Year: Never true  Transportation Needs: No Transportation Needs (09/06/2023)   PRAPARE - Administrator, Civil Service (Medical): No    Lack of Transportation (Non-Medical): No  Physical Activity: Inactive (09/06/2023)   Exercise Vital  Sign    Days of Exercise per Week: 0 days    Minutes of Exercise per Session: 0 min  Stress: No Stress Concern Present (09/06/2023)   [employer] of Occupational Health - Occupational Stress Questionnaire    Feeling of Stress : Only a little  Social Connections: Moderately Isolated (09/06/2023)   Social Connection and Isolation Panel [NHANES]    Frequency of Communication with Friends and Family: Once a week    Frequency of Social Gatherings with Friends and Family: Never    Attends Religious Services: More than 4 times per year    Active Member of [employer] or Organizations: Yes    Attends Engineer, structural: More than 4 times per year    Marital Status: Divorced    Tobacco Counseling Counseling given: Not Answered    Clinical Intake:  Pre-visit preparation completed: Yes  Pain : 0-10 Pain Score: 4  Pain Type: Chronic pain Pain Location: Foot Pain Orientation: Left Pain Radiating Towards: ankle Pain Descriptors / Indicators: Aching, Sore, Discomfort, Constant Pain Onset: More than a month ago Pain Frequency: Constant     BMI - recorded: 25.3 Nutritional Status: BMI 25 -29 Overweight Nutritional Risks:  None Diabetes: No  Lab Results  Component Value Date   HGBA1C 5.4 08/17/2022   HGBA1C 5.1 10/31/2017   HGBA1C 5.0 10/07/2015     How often do you need to have someone help you when you read instructions, pamphlets, or other written materials from your doctor or pharmacy?: 1 - Never  Interpreter Needed?: No  Information entered by :: Dellie Fergusson, LPN   Activities of Daily Living     09/06/2023   11:02 AM  In your present state of health, do you have any difficulty performing the following activities:  Hearing? 0  Vision? 0  Difficulty concentrating or making decisions? 0  Walking or climbing stairs? 1  Comment FOOT PAIN  Dressing or bathing? 0  Doing errands, shopping? 0  Preparing Food and eating ? N  Using the Toilet? N  In the past six months, have you accidently leaked urine? N  Do you have problems with loss of bowel control? N  Managing your Medications? N  Managing your Finances? N  Housekeeping or managing your Housekeeping? N    Patient Care Team: Tasia Farr, FNP as PCP - General (Family Medicine) Constancia Delton, MD as PCP - Cardiology (Cardiology) Teresa Fender, MD (Inactive) as Referring Physician (Obstetrics and Gynecology) Clair Crews, MD as Referring Physician (Ophthalmology) Ward, Margarie Shay, MD as Referring Physician (Obstetrics and Gynecology) Fulton Job, MD (Inactive) as Consulting Physician (Psychiatry) Quirino Buckles, LCSW as Social Worker (Licensed Clinical Social Worker) Lorelei Rogers, Nicolas Barren, MD as Physician Assistant (Endocrinology)  I have updated your Care Teams any recent Medical Services you may have received from other providers in the past year.     Assessment:    This is a routine wellness examination for Diamond Lucas.  Hearing/Vision screen Hearing Screening - Comments:: NO AIDS Vision Screening - Comments:: DRIVING (CONTACTS OR GLASSES)- [facility]   Goals Addressed             This Visit's Progress    Cut out  extra servings         Depression Screen     04/13/2023    3:40 PM 03/08/2023    9:38 AM 09/05/2022   11:18 AM 06/17/2022    1:44 PM 12/08/2021    8:13 AM 07/05/2021   11:09 AM 06/08/2021  9:00 AM  PHQ 2/9 Scores  PHQ - 2 Score 2 3 2 5 2 2 4   PHQ- 9 Score 3 9 3 8 5 5 9     Fall Risk     09/06/2023   11:02 AM 04/13/2023    3:40 PM 01/10/2023   10:07 AM 09/05/2022   11:13 AM 06/17/2022    1:43 PM  Fall Risk   Falls in the past year? 0 0 0 0 0  Number falls in past yr: 0 0 0 0   Injury with Fall? 0 0 0 0 0  Risk for fall due to : No Fall Risks  No Fall Risks No Fall Risks   Follow up Falls evaluation completed  Falls evaluation completed Education provided;Falls prevention discussed     MEDICARE RISK AT HOME:  Medicare Risk at Home Any stairs in or around the home?: Yes If so, are there any without handrails?: No Home free of loose throw rugs in walkways, pet beds, electrical cords, etc?: Yes Adequate lighting in your home to reduce risk of falls?: Yes Life alert?: No Use of a cane, walker or w/c?: No Grab bars in the bathroom?: No Shower chair or bench in shower?: No Elevated toilet seat or a handicapped toilet?: No  TIMED UP AND GO:  Was the test performed?  No  Cognitive Function: 6CIT completed    05/24/2019    8:18 AM 04/13/2017   11:03 AM  MMSE - Mini Mental State Exam  Orientation to time 5 5  Orientation to Place 5 5  Registration 3 3  Attention/ Calculation 3 5  Recall 0 3  Language- name 2 objects 1 2  Language- repeat 1 1  Language- follow 3 step command 3 3  Language- read & follow direction 1 1  Write a sentence 1 1  Copy design 1 1  Total score 24 30        09/06/2023   11:03 AM 09/05/2022   11:30 AM 05/24/2019    8:15 AM 07/12/2017    2:49 PM 04/13/2017   11:00 AM  6CIT Screen  What Year? 0 points 0 points 0 points 0 points 0 points  What month? 0 points 0 points 0 points 0 points 0 points  What time? 0 points 0 points 0 points 0 points 0  points  Count back from 20 0 points 0 points 2 points 0 points 0 points  Months in reverse 0 points 0 points 2 points 0 points 0 points  Repeat phrase 0 points 0 points 2 points 0 points 0 points  Total Score 0 points 0 points 6 points 0 points 0 points    Immunizations Immunization History  Administered Date(s) Administered   Fluad Quad(high Dose 65+) 01/09/2020   HPV Quadrivalent 10/02/2012   Hepatitis A, Adult 09/07/2020   Hepb-cpg 09/07/2020   Influenza, Quadrivalent, Recombinant, Inj, Pf 02/02/2021   Influenza, Seasonal, Injecte, Preservative Fre 12/28/2022   Influenza,inj,Quad PF,6+ Mos 08/20/2012, 01/17/2017, 01/22/2018, 12/04/2018, 12/08/2021   Influenza-Unspecified 07/11/2019   PFIZER Comirnaty(Gray Top)Covid-19 Tri-Sucrose Vaccine 06/20/2019, 07/11/2019   PFIZER(Purple Top)SARS-COV-2 Vaccination 06/20/2019, 07/11/2019   PPD Test 06/09/2014   Pneumococcal Polysaccharide-23 01/21/2019   Rabies, IM 03/04/2015   Td 07/20/2017   Tetanus 12/26/2005   Varicella 12/03/2014   Zoster Recombinant(Shingrix) 07/05/2021, 03/22/2022    Screening Tests Health Maintenance  Topic Date Due   COVID-19 Vaccine (5 - 2024-25 season) 11/27/2022   Medicare Annual Wellness (AWV)  09/05/2024   MAMMOGRAM  12/05/2024   Cervical Cancer Screening (HPV/Pap Cotest)  06/14/2027   DTaP/Tdap/Td (2 - Tdap) 07/21/2027   Colonoscopy  07/26/2033   Hepatitis C Screening  Completed   HIV Screening  Completed   Zoster Vaccines- Shingrix  Completed   Pneumococcal Vaccine 31-20 Years old  Aged Out   HPV VACCINES  Aged Out   Meningococcal B Vaccine  Aged Out   INFLUENZA VACCINE  Discontinued    Health Maintenance  Health Maintenance Due  Topic Date Due   COVID-19 Vaccine (5 - 2024-25 season) 11/27/2022   Health Maintenance Items Addressed: UP TO DATE ON SHINGRIX & TDAP; WANTS NO MORE COVID; UP TO DATE ON MAMMOGRAM & COLONOSCOPY  Additional Screening:  Vision Screening: Recommended annual  ophthalmology exams for early detection of glaucoma and other disorders of the eye. Would you like a referral to an eye doctor? No    Dental Screening: Recommended annual dental exams for proper oral hygiene  Community Resource Referral / Chronic Care Management: CRR required this visit?  No   CCM required this visit?  No   Plan:    I have personally reviewed and noted the following in the patient's chart:   Medical and social history Use of alcohol, tobacco or illicit drugs  Current medications and supplements including opioid prescriptions. Patient is not currently taking opioid prescriptions. Functional ability and status Nutritional status Physical activity Advanced directives List of other physicians Hospitalizations, surgeries, and ER visits in previous 12 months Vitals Screenings to include cognitive, depression, and falls Referrals and appointments  In addition, I have reviewed and discussed with patient certain preventive protocols, quality metrics, and best practice recommendations. A written personalized care plan for preventive services as well as general preventive health recommendations were provided to patient.   Pinky Bright, LPN   1/61/0960   After Visit Summary: (MyChart) Due to this being a telephonic visit, the after visit summary with patients personalized plan was offered to patient via MyChart   Notes: Nothing significant to report at this time.

## 2023-09-06 NOTE — Patient Instructions (Addendum)
 [name] , Thank you for taking time out of your busy schedule to complete your Annual Wellness Visit with me. I enjoyed our conversation and look forward to speaking with you again next year. I, as well as your care team,  appreciate your ongoing commitment to your health goals. Please review the following plan we discussed and let me know if I can assist you in the future.   Follow up Visits: Next Medicare AWV with our clinical staff:   09/11/24 @ 8:50 AM BY PHONE Have you seen your provider in the last 6 months (3 months if uncontrolled diabetes)? Yes   Clinician Recommendations:  Aim for 30 minutes of exercise or brisk walking, 6-8 glasses of water, and 5 servings of fruits and vegetables each day. TAKE CARE!      This is a list of the screening recommended for you and due dates:  Health Maintenance  Topic Date Due   COVID-19 Vaccine (5 - 2024-25 season) 11/27/2022   Medicare Annual Wellness Visit  09/05/2024   Mammogram  12/05/2024   Pap with HPV screening  06/14/2027   DTaP/Tdap/Td vaccine (2 - Tdap) 07/21/2027   Colon Cancer Screening  07/26/2033   Hepatitis C Screening  Completed   HIV Screening  Completed   Zoster (Shingles) Vaccine  Completed   Pneumococcal Vaccination  Aged Out   HPV Vaccine  Aged Out   Meningitis B Vaccine  Aged Out   Flu Shot  Discontinued    Advanced directives: (ACP Link)Information on Advanced Care Planning can be found at [employer] Advance Health Care Directives Advance Health Care Directives. [email or web address]  Advance Care Planning is important because it:  [x]  Makes sure you receive the medical care that is consistent with your values, goals, and preferences  [x]  It provides guidance to your family and loved ones and reduces their decisional burden about whether or not they are making the right decisions based on your wishes.  Follow the link provided in your after visit summary or read over the paperwork we have mailed to  you to help you started getting your Advance Directives in place. If you need assistance in completing these, please reach out to us  so that we can help you!

## 2023-09-07 ENCOUNTER — Encounter: Payer: Self-pay | Admitting: Obstetrics and Gynecology

## 2023-09-07 ENCOUNTER — Ambulatory Visit: Admitting: Obstetrics and Gynecology

## 2023-09-07 VITALS — BP 127/80 | HR 69 | Ht 61.0 in | Wt 135.0 lb

## 2023-09-07 DIAGNOSIS — R1031 Right lower quadrant pain: Secondary | ICD-10-CM | POA: Diagnosis not present

## 2023-09-07 NOTE — Patient Instructions (Signed)
 I value your feedback and you entrusting [place] with your care. If you get a [place] patient survey, I would appreciate you taking the time to let us know about your experience today. Thank you! ? ? ?

## 2023-09-11 DIAGNOSIS — F411 Generalized anxiety disorder: Secondary | ICD-10-CM | POA: Diagnosis not present

## 2023-09-11 DIAGNOSIS — F324 Major depressive disorder, single episode, in partial remission: Secondary | ICD-10-CM | POA: Diagnosis not present

## 2023-09-11 DIAGNOSIS — F209 Schizophrenia, unspecified: Secondary | ICD-10-CM | POA: Diagnosis not present

## 2023-09-12 ENCOUNTER — Ambulatory Visit

## 2023-09-12 ENCOUNTER — Encounter: Payer: Self-pay | Admitting: Obstetrics and Gynecology

## 2023-09-12 DIAGNOSIS — R1031 Right lower quadrant pain: Secondary | ICD-10-CM

## 2023-09-14 ENCOUNTER — Encounter: Payer: Self-pay | Admitting: Surgery

## 2023-09-17 ENCOUNTER — Encounter: Payer: Self-pay | Admitting: Family Medicine

## 2023-09-18 ENCOUNTER — Telehealth: Payer: Self-pay | Admitting: Family Medicine

## 2023-09-18 NOTE — Telephone Encounter (Signed)
 [facility] is requesting refill amLODipine (NORVASC) 5 MG tablet   Please advise

## 2023-09-18 NOTE — Telephone Encounter (Signed)
 Prescription on Amlodipine  was sent in 09/07/2023

## 2023-09-19 ENCOUNTER — Other Ambulatory Visit: Payer: Self-pay | Admitting: Family Medicine

## 2023-09-19 DIAGNOSIS — I10 Essential (primary) hypertension: Secondary | ICD-10-CM

## 2023-09-19 MED ORDER — AMLODIPINE BESYLATE 5 MG PO TABS: 5.0000 mg | ORAL_TABLET | Freq: Every day | ORAL | 1 refills | Status: DC

## 2023-09-20 DIAGNOSIS — H43393 Other vitreous opacities, bilateral: Secondary | ICD-10-CM | POA: Diagnosis not present

## 2023-09-20 DIAGNOSIS — H2513 Age-related nuclear cataract, bilateral: Secondary | ICD-10-CM | POA: Diagnosis not present

## 2023-09-20 DIAGNOSIS — H01003 Unspecified blepharitis right eye, unspecified eyelid: Secondary | ICD-10-CM | POA: Diagnosis not present

## 2023-09-20 DIAGNOSIS — H43811 Vitreous degeneration, right eye: Secondary | ICD-10-CM | POA: Diagnosis not present

## 2023-09-27 ENCOUNTER — Encounter: Payer: Self-pay | Admitting: Obstetrics and Gynecology

## 2023-09-29 NOTE — Telephone Encounter (Signed)
 Nothing needs to be done if she is no longer having any abd pain.

## 2023-09-29 NOTE — Telephone Encounter (Signed)
 Disregard message sent to you, I'll send to pt directly. Thx.

## 2023-10-02 DIAGNOSIS — F324 Major depressive disorder, single episode, in partial remission: Secondary | ICD-10-CM | POA: Diagnosis not present

## 2023-10-02 DIAGNOSIS — F209 Schizophrenia, unspecified: Secondary | ICD-10-CM | POA: Diagnosis not present

## 2023-10-02 DIAGNOSIS — F411 Generalized anxiety disorder: Secondary | ICD-10-CM | POA: Diagnosis not present

## 2023-10-05 ENCOUNTER — Ambulatory Visit: Payer: Medicare Other | Admitting: Dermatology

## 2023-10-05 DIAGNOSIS — E890 Postprocedural hypothyroidism: Secondary | ICD-10-CM | POA: Diagnosis not present

## 2023-10-12 DIAGNOSIS — Z1331 Encounter for screening for depression: Secondary | ICD-10-CM | POA: Diagnosis not present

## 2023-10-12 DIAGNOSIS — E890 Postprocedural hypothyroidism: Secondary | ICD-10-CM | POA: Diagnosis not present

## 2023-10-12 DIAGNOSIS — Z85850 Personal history of malignant neoplasm of thyroid: Secondary | ICD-10-CM | POA: Diagnosis not present

## 2023-10-17 ENCOUNTER — Encounter: Payer: Self-pay | Admitting: Dermatology

## 2023-10-17 ENCOUNTER — Ambulatory Visit (INDEPENDENT_AMBULATORY_CARE_PROVIDER_SITE_OTHER): Admitting: Dermatology

## 2023-10-17 DIAGNOSIS — B852 Pediculosis, unspecified: Secondary | ICD-10-CM | POA: Diagnosis not present

## 2023-10-17 DIAGNOSIS — Z7189 Other specified counseling: Secondary | ICD-10-CM

## 2023-10-17 DIAGNOSIS — L820 Inflamed seborrheic keratosis: Secondary | ICD-10-CM | POA: Diagnosis not present

## 2023-10-17 DIAGNOSIS — Z79899 Other long term (current) drug therapy: Secondary | ICD-10-CM

## 2023-10-17 DIAGNOSIS — L729 Follicular cyst of the skin and subcutaneous tissue, unspecified: Secondary | ICD-10-CM

## 2023-10-17 DIAGNOSIS — L7211 Pilar cyst: Secondary | ICD-10-CM

## 2023-10-17 MED ORDER — IVERMECTIN 3 MG PO TABS
15.0000 mg | ORAL_TABLET | ORAL | 0 refills | Status: DC
Start: 2023-10-17 — End: 2023-10-24

## 2023-10-17 NOTE — Progress Notes (Unsigned)
   New Patient Visit   Subjective  Diamond Lucas is a 63 y.o. female who presents for the following: check mole R ant ankle, sometimes seems larger then other times, hits with razor, cysts scalp, pt has had another one excised in past but do not want to have surgery and have to deal with a bald spot, pt feels she has persistent lice in eyebrows, pt recently treated by [facility]  New patient referral from [name], FNP  The following portions of the chart were reviewed this encounter and updated as appropriate: medications, allergies, medical history  Review of Systems:  No other skin or systemic complaints except as noted in HPI or Assessment and Plan.  Objective  Well appearing patient in no apparent distress; mood and affect are within normal limits.   A focused examination was performed of the following areas: Scalp, right foot   Relevant exam findings are noted in the Assessment and Plan.  Right Ankle - Anterior Stuck on waxy paps with erythema   Assessment & Plan   PILAR CYSTs Exam:  L scalp ~8.0cm sup to L ear sup helix 0.5cm pap, L post hairline/neck 0.4cm cystic pap, R post scalp 0.7cm cystic pap Benign-appearing. 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 Discussed that a cyst is a benign growth that can grow over time and sometimes get irritated or inflamed. Recommend observation if it is not bothersome. Discussed option of surgical excision to remove it if it is growing, symptomatic, or other changes noted. Please call for new or changing lesions so they can be evaluated.  INFLAMED SEBORRHEIC KERATOSIS Right Ankle - Anterior Symptomatic, irritating, patient would like treated. Destruction of lesion - Right Ankle - Anterior Complexity: simple   Destruction method: cryotherapy   Informed consent: discussed and consent obtained   Timeout:  patient name, date of birth, surgical site, and procedure verified Lesion destroyed using liquid nitrogen: Yes    Region frozen until ice ball extended beyond lesion: Yes   Outcome: patient tolerated procedure well with no complications   Post-procedure details: wound care instructions given     Persistent Lice Diagn[facility] by [facility] Eyebrows; eyelashes Exam: no evident lice today. Treatment Plan: Restart Ivermectin  3mg  5 po hs then repeat in 1 week.  Advised pt although I do not see evidence of lice today, sometimes you need a slit-lamp to diagnose this.  Advised if she is still having symptoms or concerns she is still infected with lice, the treatment is harmless and therefore we can go ahead and retreat.  Recommend she use oral as well as topical treatment together.  For the problem she should return to [facility] for evaluation.  Side effects of ivermectin  include but are not limited to: Cardiovascular: Orthostatic hypotension (1.1% ), Peripheral edema (3.2% ), Tachycardia (3.5% ) Dermatologic: Edema of face (1.2% ), Pruritus (2.8% ), Urticaria (0.9% ) Musculoskeletal: Myalgia (0.4% ) Neurologic: Dizziness (2.8% ), Headache (0.2% )   Return in about 2 months (around 12/18/2023) for recheck ISK.  I, Grayce Saunas, RMA, am acting as scribe for [name], MD .   Documentation: I have reviewed the above documentation for accuracy and completeness, and I agree with the above.  [name], MD

## 2023-10-17 NOTE — Patient Instructions (Addendum)

## 2023-10-22 ENCOUNTER — Encounter: Payer: Self-pay | Admitting: Dermatology

## 2023-10-22 DIAGNOSIS — B850 Pediculosis due to Pediculus humanus capitis: Secondary | ICD-10-CM

## 2023-10-24 ENCOUNTER — Encounter: Payer: Self-pay | Admitting: Family Medicine

## 2023-10-24 MED ORDER — PERMETHRIN 5 % EX CREA: TOPICAL_CREAM | CUTANEOUS | 1 refills | Status: DC

## 2023-10-25 ENCOUNTER — Other Ambulatory Visit: Payer: Self-pay

## 2023-10-25 NOTE — Telephone Encounter (Signed)
 We can add, prednisolone eye drop as a low allergy.

## 2023-10-25 NOTE — Telephone Encounter (Signed)
Allergy has been added 

## 2023-10-27 DIAGNOSIS — F209 Schizophrenia, unspecified: Secondary | ICD-10-CM | POA: Diagnosis not present

## 2023-10-27 DIAGNOSIS — F411 Generalized anxiety disorder: Secondary | ICD-10-CM | POA: Diagnosis not present

## 2023-10-27 DIAGNOSIS — F324 Major depressive disorder, single episode, in partial remission: Secondary | ICD-10-CM | POA: Diagnosis not present

## 2023-11-06 ENCOUNTER — Encounter: Payer: Self-pay | Admitting: Obstetrics and Gynecology

## 2023-11-06 DIAGNOSIS — M79671 Pain in right foot: Secondary | ICD-10-CM | POA: Diagnosis not present

## 2023-11-06 DIAGNOSIS — M7662 Achilles tendinitis, left leg: Secondary | ICD-10-CM | POA: Diagnosis not present

## 2023-11-06 DIAGNOSIS — M84374A Stress fracture, right foot, initial encounter for fracture: Secondary | ICD-10-CM | POA: Diagnosis not present

## 2023-11-07 DIAGNOSIS — S0992XA Unspecified injury of nose, initial encounter: Secondary | ICD-10-CM | POA: Diagnosis not present

## 2023-11-08 ENCOUNTER — Telehealth (INDEPENDENT_AMBULATORY_CARE_PROVIDER_SITE_OTHER): Payer: Self-pay

## 2023-11-08 NOTE — Telephone Encounter (Signed)
 Patient left a message stating that she is concerned with the bulging veins on the left leg. Patient was last seen in the office 02/10/2022. Please Advise

## 2023-11-09 ENCOUNTER — Encounter: Payer: Self-pay | Admitting: Dermatology

## 2023-11-09 ENCOUNTER — Ambulatory Visit (INDEPENDENT_AMBULATORY_CARE_PROVIDER_SITE_OTHER): Admitting: Dermatology

## 2023-11-09 DIAGNOSIS — Z79899 Other long term (current) drug therapy: Secondary | ICD-10-CM

## 2023-11-09 DIAGNOSIS — L820 Inflamed seborrheic keratosis: Secondary | ICD-10-CM | POA: Diagnosis not present

## 2023-11-09 DIAGNOSIS — Z7189 Other specified counseling: Secondary | ICD-10-CM | POA: Diagnosis not present

## 2023-11-09 DIAGNOSIS — L299 Pruritus, unspecified: Secondary | ICD-10-CM | POA: Diagnosis not present

## 2023-11-09 NOTE — Patient Instructions (Addendum)
 Recommend if you are experience itch at face For Hives (urticaria); dermatographism or Itch: Start non sedating antihistamine (either Allegra 180mg , or Claritin 10mg , or Zyrtec 10mg ) daily.  All these are non-prescription (Over the Counter).   Start out with 1 pill a day.   After a week if not improving may increase to 2 pills a day.   After another week if not improving may increase to 3 pills a day.   After another week if still not improving may take up to 4 pills a day. Stay at highest dose that keeps condition controlled, but only up to 4 pills a day. Stay at the controlling dose for at least 2 weeks. Contact office if taking 4 pills of antihistamine a day for at least 2 weeks without control of condition as other options may be available.   Due to recent changes in healthcare laws, you may see results of your pathology and/or laboratory studies on MyChart before the doctors have had a chance to review them. We understand that in some cases there may be results that are confusing or concerning to you. Please understand that not all results are received at the same time and often the doctors may need to interpret multiple results in order to provide you with the best plan of care or course of treatment. Therefore, we ask that you please give us  2 business days to thoroughly review all your results before contacting the office for clarification. Should we see a critical lab result, you will be contacted sooner.   If You Need Anything After Your Visit  If you have any questions or concerns for your doctor, please call our main line at [phone] and press option 4 to reach your doctor's medical assistant. If no one answers, please leave a voicemail as directed and we will return your call as soon as possible. Messages left after 4 pm will be answered the following business day.   You may also send us  a message via MyChart. We typically respond to MyChart messages within 1-2 business  days.  For prescription refills, please ask your pharmacy to contact our office. Our fax number is [phone].  If you have an urgent issue when the clinic is closed that cannot wait until the next business day, you can page your doctor at the number below.    Please note that while we do our best to be available for urgent issues outside of office hours, we are not available 24/7.   If you have an urgent issue and are unable to reach us , you may choose to seek medical care at your doctor's office, retail clinic, urgent care center, or emergency room.  If you have a medical emergency, please immediately call 911 or go to the emergency department.  Pager Numbers  - Dr. Hester: [phone]  - Dr. Jackquline: [phone]  - Dr. Claudene: [phone]   In the event of inclement weather, please call our main line at [phone] for an update on the status of any delays or closures.  Dermatology Medication Tips: Please keep the boxes that topical medications come in in order to help keep track of the instructions about where and how to use these. Pharmacies typically print the medication instructions only on the boxes and not directly on the medication tubes.   If your medication is too expensive, please contact our office at [phone] option 4 or send us  a message through MyChart.   We are unable to tell what your co-pay for  medications will be in advance as this is different depending on your insurance coverage. However, we may be able to find a substitute medication at lower cost or fill out paperwork to get insurance to cover a needed medication.   If a prior authorization is required to get your medication covered by your insurance company, please allow us  1-2 business days to complete this process.  Drug prices often vary depending on where the prescription is filled and some pharmacies may offer cheaper prices.  The website www.goodrx.com contains coupons for medications  through different pharmacies. The prices here do not account for what the cost may be with help from insurance (it may be cheaper with your insurance), but the website can give you the price if you did not use any insurance.  - You can print the associated coupon and take it with your prescription to the pharmacy.  - You may also stop by our office during regular business hours and pick up a GoodRx coupon card.  - If you need your prescription sent electronically to a different pharmacy, notify our office through [facility] or by phone at [phone] option 4.     Si Usted Necesita Algo Despus de Su Visita  Tambin puede enviarnos un mensaje a travs de Clinical cytogeneticist. Por lo general respondemos a los mensajes de MyChart en el transcurso de 1 a 2 das hbiles.  Para renovar recetas, por favor pida a su farmacia que se ponga en contacto con nuestra oficina. Randi lakes de fax es [place] [phone].  Si tiene un asunto urgente cuando la clnica est cerrada y que no puede esperar hasta el siguiente da hbil, puede llamar/localizar a su doctor(a) al nmero que aparece a continuacin.   Por favor, tenga en cuenta que aunque hacemos todo lo posible para estar disponibles para asuntos urgentes fuera del horario de [place], no estamos disponibles las 24 horas del da, los 7 [address]  [address] de la [place].   Si tiene un problema urgente y no puede comunicarse con nosotros, puede optar por buscar atencin mdica  en el consultorio de su doctor(a), en una clnica privada, en un centro de atencin urgente o en una sala de emergencias.  Si tiene Engineer, drilling, por favor llame inmediatamente al 911 o vaya a la sala de emergencias.  Nmeros de bper  - Dr. Hester: [phone]  - Dra. Jackquline: [phone]  - Dr. Claudene: [phone]   En caso de inclemencias del tiempo, por favor llame a landry capes principal al [phone] para una actualizacin sobre el [place] de cualquier retraso o  cierre.  Consejos para la medicacin en dermatologa: Por favor, guarde las cajas en las que vienen los medicamentos de uso tpico para ayudarle a seguir las instrucciones sobre dnde y cmo usarlos. Las farmacias generalmente imprimen las instrucciones del medicamento slo en las cajas y no directamente en los tubos del [place].   Si su medicamento es muy caro, por favor, pngase en contacto con landry rieger llamando al [phone] y presione la opcin 4 o envenos un mensaje a travs de Clinical cytogeneticist.   No podemos decirle cul ser su copago por los medicamentos por adelantado ya que esto es diferente dependiendo de la cobertura de su seguro. Sin embargo, es posible que podamos encontrar un medicamento sustituto a Audiological scientist un formulario para que el seguro cubra el medicamento que se considera necesario.   Si se requiere una autorizacin previa para que su compaa de seguros malta su medicamento, por favor  permtanos de 1 a 2 das hbiles para completar [address]  Los precios de los medicamentos varan con frecuencia dependiendo del Environmental consultant de dnde se surte la receta y alguna farmacias pueden ofrecer precios ms baratos.  El sitio web www.goodrx.com tiene cupones para medicamentos de Health and safety inspector. Los precios aqu no tienen en cuenta lo que podra costar con la ayuda del seguro (puede ser ms barato con su seguro), pero el sitio web puede darle el precio si no utiliz Tourist information centre manager.  - Puede imprimir el cupn correspondiente y llevarlo con su receta a la farmacia.  - Tambin puede pasar por nuestra oficina durante el horario de atencin regular y Education officer, museum una tarjeta de cupones de GoodRx.  - Si necesita que su receta se enve electrnicamente a una farmacia diferente, informe a nuestra oficina a travs de MyChart de [place] o por telfono llamando al [phone] y presione la opcin 4.

## 2023-11-09 NOTE — Telephone Encounter (Signed)
 LVM for pt TCB and schedule appt  LLE reflux + see fb/gs/bp

## 2023-11-09 NOTE — Progress Notes (Signed)
 Follow-Up Visit   Subjective  Diamond Lucas is a 63 y.o. female who presents for the following: here today concerning an isk at right ankle that was treated 2 weeks ago would like rechecked. She is also here to today to re evaluate itchy at face , eyes, eyebrows, nose.   Reports still itchy at eyebrows, eyes, nose and face feels like she has tiny bugs. Would like to discuss oral and topical treatment for ivermectin .   The following portions of the chart were reviewed this encounter and updated as appropriate: medications, allergies, medical history  Review of Systems:  No other skin or systemic complaints except as noted in HPI or Assessment and Plan.  Objective  Well appearing patient in no apparent distress; mood and affect are within normal limits.  A focused examination was performed of the following areas: face Relevant exam findings are noted in the Assessment and Plan.    Assessment & Plan   Re-evaluation of itch at face History of Lice diagnosed by [facility] at eyebrows and eyelashes  Exam: no evidence of lice or bugs or other parasite seen today by thorough detailed magnified exam on scalp, head; hair; face, eyebrows, eyelashes, nose.  Treatment Plan: Discussed with patient why she felt she needed prescription for both oral and topical ivermectin . Patient states she feels she still has lice at nose, eyelashes, eyebrows, nose and face after multiple treatments of ivermectin .  She has already been treated with 3 rounds of oral and topical Rx treatment for lice.  Advised that again, I see nothing today to suggest lice infestation, and if she ever had it in the past, 3 rounds of oral and topical treatment surely would eradicate anything she may have had.   Reviewed lice and life cycle and low risk of getting lice or other human parasites in most environments.  She continues to wash with lice shampoo daily.   Advised pt there is No evidence of nits or live  lice observed today at exam  Do not see signs of lice or bugs or parasites in plastic bag brought in by patient that she thought showed evidence of bugs from her nose.  Discussed seen no evidence of lice at last visit and do not see any evidence of lice at this visit  Discussed lice etiology  Discussed dust mite etiology    Recommend for itching at face That may be just a normal every day occurrence or associated with Histamine release. For Hives (urticaria); dermatographism or Itch: Start non sedating antihistamine (either Allegra 180mg , or Claritin 10mg , or Zyrtec 10mg ) daily.  All these are non-prescription (Over the Counter).   Start out with 1 pill a day.   After a week if not improving may increase to 2 pills a day.   After another week if not improving may increase to 3 pills a day.   After another week if still not improving may take up to 4 pills a day. Stay at highest dose that keeps condition controlled, but only up to 4 pills a day. Stay at the controlling dose for at least 2 weeks. Contact office if taking 4 pills of antihistamine a day for at least 2 weeks without control of condition as other options may be available.  History of Inflamed Seb Ker treated last visit with LN2 Right ankle Healing well. Advised looks like progressing as expected, but too early to re-treat.   Re-check next visit already scheduled for Sept 2025.  Return for keep follow up as scheduled .  IEleanor Blush, CMA, am acting as scribe for [name], MD.   Documentation: I have reviewed the above documentation for accuracy and completeness, and I agree with the above.  [name], MD

## 2023-11-09 NOTE — Telephone Encounter (Signed)
 She can come in with reflux studies to see GS/APP, she should be advised to continue with use of compression for varicosities.

## 2023-11-10 ENCOUNTER — Other Ambulatory Visit (INDEPENDENT_AMBULATORY_CARE_PROVIDER_SITE_OTHER): Payer: Self-pay | Admitting: Nurse Practitioner

## 2023-11-10 DIAGNOSIS — I8310 Varicose veins of unspecified lower extremity with inflammation: Secondary | ICD-10-CM

## 2023-11-13 ENCOUNTER — Other Ambulatory Visit (INDEPENDENT_AMBULATORY_CARE_PROVIDER_SITE_OTHER)

## 2023-11-13 ENCOUNTER — Telehealth: Payer: Self-pay

## 2023-11-13 DIAGNOSIS — I8310 Varicose veins of unspecified lower extremity with inflammation: Secondary | ICD-10-CM

## 2023-11-13 DIAGNOSIS — I8311 Varicose veins of right lower extremity with inflammation: Secondary | ICD-10-CM

## 2023-11-13 NOTE — Telephone Encounter (Signed)
 After reviewing last note - it was the ISK frozen at 10/17/23 which Dr. Hester has documented healing well at 11/09/23 appointment.

## 2023-11-13 NOTE — Telephone Encounter (Signed)
 Patient left a voicemail Friday, 11/10/23, stating she is having a lot of pain and swelling in her leg from the freezing that Dr. Hester did at last appointment and she is now having to use crutches. Patient is cancelling September follow up. Patient states she would like to be compensated for her pain and suffering. We have her address to send a check and she does not need a call back.   Please see all of patient's medical history and advise.

## 2023-11-14 ENCOUNTER — Encounter (INDEPENDENT_AMBULATORY_CARE_PROVIDER_SITE_OTHER): Payer: Self-pay | Admitting: Vascular Surgery

## 2023-11-14 ENCOUNTER — Ambulatory Visit (INDEPENDENT_AMBULATORY_CARE_PROVIDER_SITE_OTHER): Admitting: Vascular Surgery

## 2023-11-14 VITALS — BP 123/84 | HR 63 | Ht 61.0 in | Wt 135.4 lb

## 2023-11-14 DIAGNOSIS — I10 Essential (primary) hypertension: Secondary | ICD-10-CM | POA: Diagnosis not present

## 2023-11-14 DIAGNOSIS — I83811 Varicose veins of right lower extremities with pain: Secondary | ICD-10-CM | POA: Diagnosis not present

## 2023-11-14 DIAGNOSIS — I872 Venous insufficiency (chronic) (peripheral): Secondary | ICD-10-CM

## 2023-11-14 DIAGNOSIS — I8310 Varicose veins of unspecified lower extremity with inflammation: Secondary | ICD-10-CM

## 2023-11-14 NOTE — Progress Notes (Addendum)
 Subjective:    Patient ID: Diamond Lucas, female    DOB: 21-Dec-1960, 63 y.o.   MRN: [id number] Chief Complaint  Patient presents with   Follow-up    Diamond Lucas is a 63 yo female who presents to clinic today with chief complaint of painful varicose veins.  Patient was seen by Dr. Cordella Shawl back in February 17, 2022 for the same issue.  Patient was recommended to start conservative therapy at that time.  She had venous ultrasound completed of her left leg only at this time.  There was no noted reflux or DVTs.  Patient completed venous ultrasounds of her right leg only today.  These were positive for reflux in the greater saphenous vein at the saphenous femoral junction, at the knee, and proximal calf.  Patient endorses she has not been completely compliant with conservative therapy.    Review of Systems  Constitutional: Negative.   Cardiovascular:  Positive for leg swelling.  Musculoskeletal:        Multiple painful areas of varicose veins in her right leg  Skin:  Positive for color change.       Multiple areas due to varicose veins  All other systems reviewed and are negative.      Objective:   Physical Exam Vitals reviewed.  Constitutional:      Appearance: Normal appearance. She is normal weight.  HENT:     Head: Normocephalic.  Eyes:     Pupils: Pupils are equal, round, and reactive to light.  Cardiovascular:     Rate and Rhythm: Normal rate and regular rhythm.     Pulses: Normal pulses.     Heart sounds: Normal heart sounds.  Pulmonary:     Effort: Pulmonary effort is normal.     Breath sounds: Normal breath sounds.  Abdominal:     General: Abdomen is flat. Bowel sounds are normal.     Palpations: Abdomen is soft.  Musculoskeletal:        General: Tenderness present.     Right lower leg: Edema present.     Comments: Positive tenderness with slight swelling to right lower extremity  Skin:    General: Skin is warm and dry.     Capillary Refill:  Capillary refill takes 2 to 3 seconds.  Neurological:     General: No focal deficit present.     Mental Status: She is alert and oriented to person, place, and time. Mental status is at baseline.  Psychiatric:        Mood and Affect: Mood normal.        Behavior: Behavior normal.        Thought Content: Thought content normal.        Judgment: Judgment normal.     BP 123/84   Pulse 63   Ht 5' 1 (1.549 m)   Wt 135 lb 6.4 oz (61.4 kg)   LMP 03/29/2015   BMI 25.58 kg/m   Past Medical History:  Diagnosis Date   Allergy    Arrhythmia    Cervical radiculopathy 03/09/2021   History of chicken pox    History of measles    History of syncope    Hypertension    Osteoarthritis    Palpitations    Pure hypercholesterolemia    Rosacea    Schizophrenia (HCC)    Thyroid  disease     Social History   Socioeconomic History   Marital status: Single    Spouse name: Not on file  Number of children: 0   Years of education: Not on file   Highest education level: Bachelor's degree (e.g., BA, AB, BS)  Occupational History   Occupation: retired  Tobacco Use   Smoking status: Never    Passive exposure: Never   Smokeless tobacco: Never  Vaping Use   Vaping status: Never Used  Substance and Sexual Activity   Alcohol use: No   Drug use: No   Sexual activity: Not Currently    Birth control/protection: Post-menopausal, Abstinence  Other Topics Concern   Not on file  Social History Narrative   Not on file   Social Drivers of Health   Financial Resource Strain: Medium Risk (09/06/2023)   Overall Financial Resource Strain (CARDIA)    Difficulty of Paying Living Expenses: Somewhat hard  Food Insecurity: Food Insecurity Present (09/20/2023)   [facility]   Hunger Vital Sign    Within the past 12 months, you worried that your food would run out before you got the money to buy more.: Often true    Within the past 12 months, the food you bought just  didn't last and you didn't have money to get more.: Often true  Transportation Needs: No Transportation Needs (09/06/2023)   PRAPARE - Administrator, Civil Service (Medical): No    Lack of Transportation (Non-Medical): No  Physical Activity: Inactive (09/20/2023)   Received from [facility]   Exercise Vital Sign    On average, how many days per week do you engage in moderate to strenuous exercise (like a brisk walk)?: 0 days    On average, how many minutes do you engage in exercise at this level?: 0 min  Stress: No Stress Concern Present (09/06/2023)   [employer] of Occupational Health - Occupational Stress Questionnaire    Feeling of Stress : Only a little  Social Connections: Moderately Isolated (09/06/2023)   Social Connection and Isolation Panel    Frequency of Communication with Friends and Family: Once a week    Frequency of Social Gatherings with Friends and Family: Never    Attends Religious Services: More than 4 times per year    Active Member of Clubs or Organizations: Yes    Attends Banker Meetings: More than 4 times per year    Marital Status: Divorced  Intimate Partner Violence: Not At Risk (09/06/2023)   Humiliation, Afraid, Rape, and Kick questionnaire    Fear of Current or Ex-Partner: No    Emotionally Abused: No    Physically Abused: No    Sexually Abused: No    Past Surgical History:  Procedure Laterality Date   COLONOSCOPY WITH PROPOFOL  N/A 02/08/2018   Procedure: COLONOSCOPY WITH PROPOFOL ;  Surgeon: Janalyn Keene NOVAK, MD;  Location: [facility] ENDOSCOPY;  Service: Endoscopy;  Laterality: N/A;   COLONOSCOPY WITH PROPOFOL  N/A 07/27/2023   Procedure: COLONOSCOPY WITH PROPOFOL ;  Surgeon: Unk Corinn Skiff, MD;  Location: [facility] ENDOSCOPY;  Service: Gastroenterology;  Laterality: N/A;   FOOT SURGERY Right    POLYPECTOMY  07/27/2023   Procedure: POLYPECTOMY, INTESTINE;  Surgeon: Unk Corinn Skiff, MD;  Location: [facility]  ENDOSCOPY;  Service: Gastroenterology;;   THYROIDECTOMY      Family History  Problem Relation Age of Onset   Depression Mother    Cataracts Mother    Stroke Mother        mini   Kidney cancer Father    Anxiety disorder Brother    Cancer Other  unknown cancer type   Heart failure Other    Heart disease Other    Stroke Other    Diabetes Other     Allergies  Allergen Reactions   Diclofenac Sodium Palpitations   Naproxen Itching, Rash and Hives   Olanzapine      Other reaction(s): Other Cross-eyed and hyperventilation   Gluten Meal Other (See Comments)    Abdominal bloating   Mite (D. Farinae) Itching    Respiratory illness, and itchy eyes, eye problems Respiratory illness, and itchy eyes, eye problems   Nsaids Other (See Comments)    Other reaction(s): Other (See Comments) Aleve causes hives 10/15 pt states she occ. Takes Motrin . Other reaction(s): Other (See Comments) Aleve causes hives   Other Itching    Respiratory illness, and itchy eyes, eye problems Respiratory illness, and itchy eyes, eye problems   Risperidone     Synthroid  [Levothyroxine ]     Brand name only- causes acid reflux   Prednisolone     Eye drops       Latest Ref Rng & Units 08/17/2022   12:00 AM 10/31/2017    1:55 PM 07/12/2017    4:07 PM  CBC  WBC  7.2     8.1  7.6   Hemoglobin 12.0 - 16.0 14.4     14.8  14.0   Hematocrit 36 - 46 44     42.8  42.2   Platelets 150 - 400 K/uL 250     236  231      This result is from an external source.      CMP     Component Value Date/Time   NA 140 04/13/2023 1612   K 4.1 04/13/2023 1612   CL 100 04/13/2023 1612   CO2 26 04/13/2023 1612   GLUCOSE 90 04/13/2023 1612   GLUCOSE 104 (H) 10/31/2017 1355   BUN 23 04/13/2023 1612   CREATININE 0.83 04/13/2023 1612   CALCIUM  9.3 04/13/2023 1612   PROT 6.9 04/13/2023 1612   ALBUMIN 4.4 04/13/2023 1612   AST 18 04/13/2023 1612   ALT 26 04/13/2023 1612   ALKPHOS 107 04/13/2023 1612   BILITOT <0.2  04/13/2023 1612   EGFR 81 04/13/2023 1612   GFRNONAA 66 10/08/2019 1354     No results found.     Assessment & Plan:   1. Varicose veins with inflammation (Primary) Recommend:   The patient is complaining of varicose veins.     I have had a long discussion with the patient regarding  varicose veins and why they cause symptoms.  Patient will begin wearing graduated compression stockings on a daily basis, beginning first thing in the morning and removing them in the evening. The patient is instructed specifically not to sleep in the stockings.     The patient  will also begin using over-the-counter analgesics such as Motrin  600 mg po TID to help control the symptoms as needed.     In addition, behavioral modification including elevation during the day will be initiated, utilizing a recliner was recommended.  The patient is also instructed to continue exercising such as walking 4-5 times per week.   At this time the patient wishes to continue conservative therapy and is not interested in more invasive treatments such as laser ablation and sclerotherapy.   The Patient will follow up if the symptoms worsen or in 3 months without any studies whichever comes first.  2. Chronic venous insufficiency See #1  3. Primary hypertension Continue antihypertensive medications as  already ordered, these medications have been reviewed and there are no changes at this time.   4. Varicose veins of right lower extremity with pain See #1 - VAS US  LOWER EXTREMITY VENOUS REFLUX   Current Outpatient Medications on File Prior to Visit  Medication Sig Dispense Refill   CALCIUM  CITRATE PO Take by mouth.     Carboxymeth-Glyc-Polysorb PF 0.5-1-0.5 % SOLN Apply 2 drops to eye 2 (two) times daily.     cyclobenzaprine  (FLEXERIL ) 5 MG tablet Take 1 tablet (5 mg total) by mouth daily as needed for muscle spasms. 90 tablet 0   Ibuprofen  (MOTRIN  PO) Take by mouth as needed (liquid).     Levothyroxine  Sodium  (TIROSINT ) 50 MCG CAPS Take 75 mcg by mouth daily.     Multiple Vitamin (MULTI-VITAMINS) TABS daily.      Omega-3 Fatty Acids (FISH OIL) 1000 MG CAPS Take by mouth. Takes occasionally due to causing acid reflux     permethrin  (ELIMITE ) 5 % cream Apply to the scalp, eyebrows, and eyelids, but not in the eye. Let sit overnight and wash off in the morning. 60 g 1   spironolactone (ALDACTONE) 25 MG tablet Take 25 mg by mouth daily.     tobramycin  (TOBREX ) 0.3 % ophthalmic solution Place 2 drops into the left eye every 4 (four) hours. While awake 5 mL 0   triamcinolone  (NASACORT ) 55 MCG/ACT AERO nasal inhaler Place 2 sprays into the nose as needed. Once daily as needed.     UNABLE TO FIND Take 1 capsule by mouth 2 (two) times daily. Med Name: VENEASE     amLODipine  (NORVASC ) 5 MG tablet Take 1 tablet (5 mg total) by mouth daily. (Patient not taking: Reported on 11/14/2023) 90 tablet 1   No current facility-administered medications on file prior to visit.    There are no Patient Instructions on file for this visit. No follow-ups on file.   Gwendlyn JONELLE Shank, NP

## 2023-11-15 ENCOUNTER — Encounter: Payer: Self-pay | Admitting: Family Medicine

## 2023-11-16 ENCOUNTER — Encounter (INDEPENDENT_AMBULATORY_CARE_PROVIDER_SITE_OTHER): Payer: Self-pay

## 2023-11-16 NOTE — Telephone Encounter (Signed)
 Can be virtual, but may need further assessment in person.

## 2023-11-17 DIAGNOSIS — S0033XA Contusion of nose, initial encounter: Secondary | ICD-10-CM | POA: Diagnosis not present

## 2023-11-21 ENCOUNTER — Encounter: Payer: Self-pay | Admitting: Obstetrics and Gynecology

## 2023-11-23 DIAGNOSIS — F411 Generalized anxiety disorder: Secondary | ICD-10-CM | POA: Diagnosis not present

## 2023-11-23 DIAGNOSIS — F209 Schizophrenia, unspecified: Secondary | ICD-10-CM | POA: Diagnosis not present

## 2023-11-23 DIAGNOSIS — F324 Major depressive disorder, single episode, in partial remission: Secondary | ICD-10-CM | POA: Diagnosis not present

## 2023-11-28 DIAGNOSIS — M65979 Unspecified synovitis and tenosynovitis, unspecified ankle and foot: Secondary | ICD-10-CM | POA: Diagnosis not present

## 2023-11-28 DIAGNOSIS — M79671 Pain in right foot: Secondary | ICD-10-CM | POA: Diagnosis not present

## 2023-11-30 ENCOUNTER — Ambulatory Visit: Payer: Self-pay

## 2023-11-30 NOTE — Telephone Encounter (Signed)
 FYI Only or Action Required?: FYI only for provider.  Patient was last seen in primary care on 08/07/2023 by Sharma Coyer, MD.  Called Nurse Triage reporting Foot Pain.  Symptoms began several months ago.  Interventions attempted: OTC medications: Aloe vera gel, polysporin  ointment.  Symptoms are: gradually worsening.  Triage Disposition: See Physician Within 24 Hours  Patient/caregiver understands and will follow disposition?: Yes           Copied from CRM #[id number]. Topic: Clinical - Red Word Triage >> Nov 30, 2023  9:32 AM Precious C wrote: [facility] that prompted transfer to Nurse Triage: SEVERE PAIN   Patient called in reporting severe pain in her right foot, rated 9/10. Due to the severity, an appointment has been scheduled. Patient is also requesting to speak with a nurse for further guidance on what to do moving forward. Reason for Disposition  Numbness (i.e., loss of sensation) in foot or toes  (Exception: Just tingling; numbness present > 2 weeks.)  Answer Assessment - Initial Assessment Questions Pt had a mole on the area and had it frozen with liquid nitrogen.Pt states she also has had swelling in the area and her veins look like they are bulging. Pt states shoes worsens the pain in the area. Patient states the area looks like frostbite and the area is tan to color and  the foot is numb is some areas and painful in other areas. Pt states she has had numbness and is able to feel bottom of the foot, some places feel like dead tissue. Pt using aloe vera gel, polysporion ointment for symptoms. Pt able to wiggle toes, states the function to her foot is not as smooth as it used to be and foot is making a clicking noise. Denies blisters in the area. Pt spoke with a specialist before nurse triage and had an appointment made for tomorrow.     1. ONSET: When did the pain start?      July 22nd, 2025 2. LOCATION: Where is the pain located?      R foot at the  top  3. PAIN: How bad is the pain?    (Scale 1-10; or mild, moderate, severe)     9/10 4. CAUSE: What do you think is causing the foot pain?     Liquid nitrogen applied to remove mole.  5. OTHER SYMPTOMS: Do you have any other symptoms? (e.g., leg pain, rash, fever, numbness)     Has felt feverish at times but unsure if she has had a fever.  Protocols used: Foot Pain-A-AH

## 2023-12-01 ENCOUNTER — Ambulatory Visit (INDEPENDENT_AMBULATORY_CARE_PROVIDER_SITE_OTHER): Admitting: Physician Assistant

## 2023-12-01 ENCOUNTER — Encounter: Payer: Self-pay | Admitting: Physician Assistant

## 2023-12-01 VITALS — BP 151/87 | HR 73 | Ht 61.0 in | Wt 139.9 lb

## 2023-12-01 DIAGNOSIS — I10 Essential (primary) hypertension: Secondary | ICD-10-CM | POA: Diagnosis not present

## 2023-12-01 DIAGNOSIS — M79671 Pain in right foot: Secondary | ICD-10-CM

## 2023-12-01 DIAGNOSIS — B850 Pediculosis due to Pediculus humanus capitis: Secondary | ICD-10-CM

## 2023-12-01 DIAGNOSIS — Z23 Encounter for immunization: Secondary | ICD-10-CM

## 2023-12-01 NOTE — Progress Notes (Signed)
 Established patient visit  Patient: Diamond Lucas   DOB: December 13, 1960   63 y.o. Female  MRN: [id number] Visit Date: 12/01/2023  Today's healthcare provider: Jolynn Spencer, PA-C   Chief Complaint  Patient presents with   Foot Pain    Right foot pain X July 22 after procedure on foot. Reports by end of day she is usually not able tto walk. She has taken extra strength tylenol  and motrin .    Immunizations    Influenza - ok to order Pneumococcal - would like to get in October   Subjective     HPI     Foot Pain    Additional comments: Right foot pain X July 22 after procedure on foot. Reports by end of day she is usually not able tto walk. She has taken extra strength tylenol  and motrin .         Immunizations    Additional comments: Influenza - ok to order Pneumococcal - would like to get in October      Last edited by Lilian Fitzpatrick, CMA on 12/01/2023  8:41 AM.       Discussed the use of AI scribe software for clinical note transcription with the patient, who gave verbal consent to proceed.  History of Present Illness Diamond Lucas is a 63 year old female who presents with foot pain and numbness following cryotherapy for mole removal.  She experiences foot pain and numbness after cryotherapy with liquid nitrogen for mole removal. The mole was large and thick, and the treatment involved spraying, leading to pain and numbness in the foot. The sensation is described as almost paralyzed, intermittent with pain, and a burning sensation. The numbness is persistent, with a feeling likened to a piece of cooked chicken. Symptoms were worse yesterday but have improved slightly, though numbness remains.  An x-ray performed on September 2nd showed no abnormalities. She has been taking NSAIDs such as Motrin  or meloxicam, with increased usage yesterday. Contrast baths with cold and heat provide temporary pain relief.  She inquires about protein supplements or glucosamine to  aid in healing but has not started any new supplements.       12/01/2023    8:10 AM 04/13/2023    3:40 PM 03/08/2023    9:38 AM  Depression screen PHQ 2/9  Decreased Interest 0 1 0  Down, Depressed, Hopeless 1 1 3   PHQ - 2 Score 1 2 3   Altered sleeping 0 0 0  Tired, decreased energy 2 1 3   Change in appetite 0 0 0  Feeling bad or failure about yourself  0 0 2  Trouble concentrating 0 0 0  Moving slowly or fidgety/restless 0 0 1  Suicidal thoughts 0 0 0  PHQ-9 Score 3 3 9   Difficult doing work/chores Somewhat difficult Somewhat difficult Somewhat difficult      12/01/2023    8:10 AM 04/13/2023    3:40 PM 03/08/2023    9:39 AM 06/08/2021    9:02 AM  GAD 7 : Generalized Anxiety Score  Nervous, Anxious, on Edge 1 1 1 1   Control/stop worrying 2 0 2 1  Worry too much - different things 0 1 2 1   Trouble relaxing 1 0 2 1  Restless 0 0 0 0  Easily annoyed or irritable 0 0 0 0  Afraid - awful might happen 1 1 3 1   Total GAD 7 Score 5 3 10 5   Anxiety Difficulty Somewhat difficult Somewhat difficult  Somewhat difficult  Medications: Outpatient Medications Prior to Visit  Medication Sig   amLODipine  (NORVASC ) 5 MG tablet Take 1 tablet (5 mg total) by mouth daily. (Patient not taking: Reported on 11/14/2023)   CALCIUM  CITRATE PO Take by mouth.   Carboxymeth-Glyc-Polysorb PF 0.5-1-0.5 % SOLN Apply 2 drops to eye 2 (two) times daily.   cyclobenzaprine  (FLEXERIL ) 5 MG tablet Take 1 tablet (5 mg total) by mouth daily as needed for muscle spasms.   Ibuprofen  (MOTRIN  PO) Take by mouth as needed (liquid).   Levothyroxine  Sodium (TIROSINT ) 50 MCG CAPS Take 75 mcg by mouth daily.   Multiple Vitamin (MULTI-VITAMINS) TABS daily.    Omega-3 Fatty Acids (FISH OIL) 1000 MG CAPS Take by mouth. Takes occasionally due to causing acid reflux   permethrin  (ELIMITE ) 5 % cream Apply to the scalp, eyebrows, and eyelids, but not in the eye. Let sit overnight and wash off in the morning.   spironolactone  (ALDACTONE) 25 MG tablet Take 25 mg by mouth daily.   tobramycin  (TOBREX ) 0.3 % ophthalmic solution Place 2 drops into the left eye every 4 (four) hours. While awake   triamcinolone  (NASACORT ) 55 MCG/ACT AERO nasal inhaler Place 2 sprays into the nose as needed. Once daily as needed.   UNABLE TO FIND Take 1 capsule by mouth 2 (two) times daily. Med Name: VENEASE   No facility-administered medications prior to visit.    Review of Systems All negative Except see HPI       Objective    BP (!) 151/87 (BP Location: Left Arm, Patient Position: Sitting, Cuff Size: Normal)   Pulse 73   Ht 5' 1 (1.549 m)   Wt 139 lb 14.4 oz (63.5 kg)   LMP 03/29/2015   SpO2 98%   BMI 26.43 kg/m     Physical Exam Vitals reviewed.  Constitutional:      General: She is not in acute distress.    Appearance: Normal appearance. She is well-developed. She is not diaphoretic.  HENT:     Head: Normocephalic and atraumatic.  Eyes:     General: No scleral icterus.    Conjunctiva/sclera: Conjunctivae normal.  Neck:     Thyroid : No thyromegaly.  Cardiovascular:     Rate and Rhythm: Normal rate and regular rhythm.     Pulses: Normal pulses.     Heart sounds: Normal heart sounds. No murmur heard. Pulmonary:     Effort: Pulmonary effort is normal. No respiratory distress.     Breath sounds: Normal breath sounds. No wheezing, rhonchi or rales.  Musculoskeletal:     Cervical back: Neck supple.     Right lower leg: No edema.     Left lower leg: No edema.  Lymphadenopathy:     Cervical: No cervical adenopathy.  Skin:    General: Skin is warm and dry.     Findings: No rash.  Neurological:     Mental Status: She is alert and oriented to person, place, and time. Mental status is at baseline.  Psychiatric:        Mood and Affect: Mood normal.        Behavior: Behavior normal.      No results found for any visits on 12/01/23.      Assessment & Plan Neuropathic pain and anesthesia of right foot  following cryotherapy Neuropathic pain and anesthesia in the right foot post-cryotherapy for mole removal. X-ray showed no fractures, indicating soft tissue involvement. Current management includes NSAIDs and contrast baths. - Continue NSAIDs such  as Motrin  or meloxicam as needed with food. - Perform contrast baths with alternating cold and heat. - Consider glucosamine supplements. - Follow up with podiatry as needed.  General Health Maintenance Discussed flu shot administration. She tolerates flu shots well and decided to proceed despite potential concerns about foot pain. - Administer flu shot.  Primary hypertension (Primary) Chronic and unstable Could be due to not taking BP med this morning Advised to measure her BP daily and bring her records And BP device. Advised low salt diet and regular exercise Will follow-up with pcp   Lice infested hair In the past was prescribed permethrin  Asymptomatic, normal pe without signs of lice Pt was seen by dermatology on 11/09/2023. Pt completed 3 rounds of oral and topical treatment for lice. Advised to rtc when she will have problem again. Will follow-up   Immunization due  - Flu vaccine trivalent PF, 6mos and older(Flulaval,Afluria,Fluarix,Fluzone)  Orders Placed This Encounter  Procedures   Flu vaccine trivalent PF, 6mos and older(Flulaval,Afluria,Fluarix,Fluzone)    No follow-ups on file.   The patient was advised to call back or seek an in-person evaluation if the symptoms worsen or if the condition fails to improve as anticipated.  I discussed the assessment and treatment plan with the patient. The patient was provided an opportunity to ask questions and all were answered. The patient agreed with the plan and demonstrated an understanding of the instructions.  I, Lin Hackmann, PA-C have reviewed all documentation for this visit. The documentation on 12/01/2023  for the exam, diagnosis, procedures, and orders are all accurate and  complete.  Jolynn Spencer, [facility], MMS [facility] [phone] (phone) [phone] (fax)  [facility]

## 2023-12-06 ENCOUNTER — Ambulatory Visit: Admitting: Dermatology

## 2023-12-11 ENCOUNTER — Encounter (INDEPENDENT_AMBULATORY_CARE_PROVIDER_SITE_OTHER): Payer: Self-pay

## 2023-12-12 NOTE — Telephone Encounter (Signed)
 I'm not sure what she is mentioning these things, so I will allow you to address per your conversation

## 2023-12-15 ENCOUNTER — Encounter: Payer: Self-pay | Admitting: Family Medicine

## 2023-12-15 ENCOUNTER — Other Ambulatory Visit: Payer: Self-pay | Admitting: Family Medicine

## 2023-12-15 DIAGNOSIS — L299 Pruritus, unspecified: Secondary | ICD-10-CM

## 2023-12-18 NOTE — Progress Notes (Unsigned)
     Acute visit  Patient: Diamond Lucas   DOB: 05/19/1960   63 y.o. Female  MRN: [id number] PCP: [name] LABOR, FNP   Chief Complaint  Patient presents with   Referral    Refills for lice and foot(rt) concerns   Subjective     Lice: - has been using shampoo at home without improvement - noticed multiple lice on her scalp and eyebrows at the [facility] a few days ago - has been using OTC shampoo but has not been effective - topical therapies have not worked - oral ivermectin  did work in the past  Review of systems as noted in HPI.   Objective    BP 116/78 (BP Location: Left Arm, Patient Position: Sitting, Cuff Size: Normal)   Pulse 80   Resp 16   Ht 5' (1.524 m)   Wt 139 lb 14.4 oz (63.5 kg)   LMP 03/29/2015   SpO2 99%   BMI 27.32 kg/m  Physical Exam Constitutional:      Appearance: Normal appearance.  HENT:     Head: Normocephalic and atraumatic.     Mouth/Throat:     Mouth: Mucous membranes are moist.  Eyes:     Pupils: Pupils are equal, round, and reactive to light.  Pulmonary:     Effort: Pulmonary effort is normal.  Skin:    General: Skin is warm.     Comments: No lice seen.  Neurological:     General: No focal deficit present.     Mental Status: She is alert.       No results found for any visits on 12/19/23.  Assessment & Plan     Problem List Items Addressed This Visit       Other   Delusional disorder, somatic type (HCC)   Chronic, uncontrolled. Patient with persistent delusions of infestation and pregnancy. Not taking anti-psychotic at this time. Will discuss with PCP about referral to psychiatry. Recommend regular, frequent visits with 1 provider in the future.      Lice - Primary   Patient here with concern for lice. States that she noticed lice in her scalp and eyebrows when at the [facility] a few days ago. Has been using OTC lice shampoo without much improvement. Brought in a bag today with what appears to be dandruff, however cannot  rule out nits. Patient states that oral ivermectin  did work in the past. Given low risk, will treat with ivermectin .  - Will treat with 1x dose of ivermectin . If no improvement, discussed with patient to repeat in 1 week - If concern for lice persists, then would not retreat given c/f delusional disorder      Relevant Medications   ivermectin  (STROMECTOL ) 3 MG TABS tablet   Other Visit Diagnoses       Encounter for immunization       Relevant Orders   Pneumococcal conjugate vaccine 20-valent (Completed)       Meds ordered this encounter  Medications   ivermectin  (STROMECTOL ) 3 MG TABS tablet    Sig: Take 4 tablets (12,000 mcg total) by mouth once for 1 dose.    Dispense:  4 tablet    Refill:  1     No follow-ups on file.      [name], MD  [facility] [phone] (phone) [phone] (fax)

## 2023-12-19 ENCOUNTER — Encounter: Payer: Self-pay | Admitting: Obstetrics and Gynecology

## 2023-12-19 ENCOUNTER — Ambulatory Visit (INDEPENDENT_AMBULATORY_CARE_PROVIDER_SITE_OTHER)

## 2023-12-19 VITALS — BP 116/78 | HR 80 | Resp 16 | Ht 60.0 in | Wt 139.9 lb

## 2023-12-19 DIAGNOSIS — F22 Delusional disorders: Secondary | ICD-10-CM

## 2023-12-19 DIAGNOSIS — Z23 Encounter for immunization: Secondary | ICD-10-CM | POA: Diagnosis not present

## 2023-12-19 DIAGNOSIS — B852 Pediculosis, unspecified: Secondary | ICD-10-CM | POA: Diagnosis not present

## 2023-12-19 MED ORDER — IVERMECTIN 3 MG PO TABS: 200.0000 ug/kg | ORAL_TABLET | Freq: Once | ORAL | 1 refills | Status: AC

## 2023-12-19 NOTE — Assessment & Plan Note (Signed)
 Patient here with concern for lice. States that she noticed lice in her scalp and eyebrows when at the [facility] a few days ago. Has been using OTC lice shampoo without much improvement. Brought in a bag today with what appears to be dandruff, however cannot rule out nits. Patient states that oral ivermectin  did work in the past. Given low risk, will treat with ivermectin .  - Will treat with 1x dose of ivermectin . If no improvement, discussed with patient to repeat in 1 week - If concern for lice persists, then would not retreat given c/f delusional disorder

## 2023-12-19 NOTE — Assessment & Plan Note (Addendum)
 Chronic, uncontrolled. Patient with persistent delusions of infestation and pregnancy. Not taking anti-psychotic at this time. Will discuss with PCP about referral to psychiatry. Recommend regular, frequent visits with 1 provider in the future.

## 2023-12-21 DIAGNOSIS — F411 Generalized anxiety disorder: Secondary | ICD-10-CM | POA: Diagnosis not present

## 2023-12-21 DIAGNOSIS — F324 Major depressive disorder, single episode, in partial remission: Secondary | ICD-10-CM | POA: Diagnosis not present

## 2023-12-21 DIAGNOSIS — F209 Schizophrenia, unspecified: Secondary | ICD-10-CM | POA: Diagnosis not present

## 2023-12-27 NOTE — Telephone Encounter (Signed)
 Needs appt

## 2023-12-31 ENCOUNTER — Other Ambulatory Visit: Payer: Self-pay

## 2023-12-31 ENCOUNTER — Emergency Department: Admission: EM | Admit: 2023-12-31 | Discharge: 2024-01-01 | Disposition: A

## 2023-12-31 ENCOUNTER — Emergency Department

## 2023-12-31 DIAGNOSIS — M25562 Pain in left knee: Secondary | ICD-10-CM | POA: Diagnosis not present

## 2023-12-31 DIAGNOSIS — I10 Essential (primary) hypertension: Secondary | ICD-10-CM | POA: Insufficient documentation

## 2023-12-31 DIAGNOSIS — W010XXA Fall on same level from slipping, tripping and stumbling without subsequent striking against object, initial encounter: Secondary | ICD-10-CM | POA: Diagnosis not present

## 2023-12-31 DIAGNOSIS — D72829 Elevated white blood cell count, unspecified: Secondary | ICD-10-CM | POA: Insufficient documentation

## 2023-12-31 DIAGNOSIS — M25561 Pain in right knee: Secondary | ICD-10-CM | POA: Diagnosis not present

## 2023-12-31 DIAGNOSIS — S86811A Strain of other muscle(s) and tendon(s) at lower leg level, right leg, initial encounter: Secondary | ICD-10-CM | POA: Diagnosis not present

## 2023-12-31 LAB — CBC WITH DIFFERENTIAL/PLATELET
Abs Immature Granulocytes: 0.06 K/uL (ref 0.00–0.07)
Basophils Absolute: 0.1 K/uL (ref 0.0–0.1)
Basophils Relative: 1 %
Eosinophils Absolute: 0.1 K/uL (ref 0.0–0.5)
Eosinophils Relative: 1 %
HCT: 41.2 % (ref 36.0–46.0)
Hemoglobin: 13.2 g/dL (ref 12.0–15.0)
Immature Granulocytes: 1 %
Lymphocytes Relative: 15 %
Lymphs Abs: 1.9 K/uL (ref 0.7–4.0)
MCH: 31 pg (ref 26.0–34.0)
MCHC: 32 g/dL (ref 30.0–36.0)
MCV: 96.7 fL (ref 80.0–100.0)
Monocytes Absolute: 0.7 K/uL (ref 0.1–1.0)
Monocytes Relative: 5 %
Neutro Abs: 10 K/uL — ABNORMAL HIGH (ref 1.7–7.7)
Neutrophils Relative %: 77 %
Platelets: 236 K/uL (ref 150–400)
RBC: 4.26 MIL/uL (ref 3.87–5.11)
RDW: 12.8 % (ref 11.5–15.5)
WBC: 12.8 K/uL — ABNORMAL HIGH (ref 4.0–10.5)
nRBC: 0 % (ref 0.0–0.2)

## 2023-12-31 MED ORDER — MORPHINE SULFATE (PF) 4 MG/ML IV SOLN
4.0000 mg | Freq: Once | INTRAVENOUS | Status: AC
  Administered 2023-12-31: 4 mg via INTRAVENOUS
  Filled 2023-12-31: qty 1

## 2023-12-31 MED ORDER — OXYCODONE HCL 5 MG PO TABS: 5.0000 mg | ORAL_TABLET | Freq: Three times a day (TID) | ORAL | 0 refills | Status: DC | PRN

## 2023-12-31 MED ORDER — ACETAMINOPHEN 500 MG PO TABS
1000.0000 mg | ORAL_TABLET | Freq: Four times a day (QID) | ORAL | 2 refills | Status: AC | PRN
Start: 2023-12-31 — End: 2024-12-30

## 2023-12-31 NOTE — Discharge Instructions (Signed)
 Your evaluation in the emergency department was consistent with a patellar tendon rupture of your right leg.  I discussed your case with an orthopedist, they recommend placing a knee immobilizer and they would like to see you in clinic--I have placed a referral for this, and they will contact you to schedule appointment.  You can use crutches as needed for any ongoing discomfort, and I have also prescribed you pain medications to use as needed.  Please do follow-up with the orthopedist and your primary care provider for reevaluation, and return to the emergency department with any new or worsening symptoms.

## 2023-12-31 NOTE — ED Triage Notes (Signed)
 Pt arrived from home via [facility] d/t fall that resulted in right knee pain and deformity. Pt was on ground when EMS arrived on scene. EMS reported that her leg could bend above the knee and distal swelling. Pt received 100mcg Fentanyl in ambulance.

## 2023-12-31 NOTE — ED Provider Notes (Signed)
 [facility] Provider Note    Event Date/Time   First MD Initiated Contact with Patient 12/31/23 2106     (approximate)   History   Knee Pain  Pt arrived from home via [facility] d/t fall that resulted in right knee pain and deformity. Pt was on ground when EMS arrived on scene. EMS reported that her leg could bend above the knee and distal swelling. Pt received 100mcg Fentanyl in ambulance.    HPI Diamond Lucas is a 63 y.o. female PMH bipolar disorder, schizophrenia, hypertension presents for evaluation of leg pain after fall - She was in her kitchen cooking today, spilled grease on the floor, slipped and landed on her knees.  Has notable right knee pain since then.  No head strike or LOC.  Not on blood thinners.  No preceding dizziness.  Has otherwise been in her usual state of health.       Physical Exam   Triage Vital Signs: ED Triage Vitals  Encounter Vitals Group     BP --      Girls Systolic BP Percentile --      Girls Diastolic BP Percentile --      Boys Systolic BP Percentile --      Boys Diastolic BP Percentile --      Pulse --      Resp --      Temp --      Temp src --      SpO2 --      Weight 12/31/23 2037 140 lb (63.5 kg)     Height 12/31/23 2037 5' (1.524 m)     Head Circumference --      Peak Flow --      Pain Score 12/31/23 2035 9     Pain Loc --      Pain Education --      Exclude from Growth Chart --     Most recent vital signs: Vitals:   12/31/23 2116 12/31/23 2241  BP:  (!) 156/80  Pulse:    Resp:  19  Temp: 99.1 F (37.3 C)   SpO2:  98%     General: Awake, no distress.  CV:  Good peripheral perfusion. RRR, RP 2+ Resp:  Normal effort. CTAB Abd:  No distention. Nontender to deep palpation throughout LLE: Full range of motion of all joints, no significant swelling or tenderness RLE: Mild swelling to right knee, able to flex, some difficulty with extending.  Pedal pulse 2+.  SI LT.  Wiggles toes.   ED  Results / Procedures / Treatments   Labs (all labs ordered are listed, but only abnormal results are displayed) Labs Reviewed  CBC WITH DIFFERENTIAL/PLATELET - Abnormal; Notable for the following components:      Result Value   WBC 12.8 (*)    Neutro Abs 10.0 (*)    All other components within normal limits  COMPREHENSIVE METABOLIC PANEL WITH GFR     EKG  N/a   RADIOLOGY Radiology interpreted by myself radiology reports reviewed.  High riding position of right patella concerning for patellar tendon rupture.  PROCEDURES:  Critical Care performed: No  Procedures   MEDICATIONS ORDERED IN ED: Medications  morphine (PF) 4 MG/ML injection 4 mg (4 mg Intravenous Given 12/31/23 2245)     IMPRESSION / MDM / ASSESSMENT AND PLAN / ED COURSE  I reviewed the triage vital signs and the nursing notes.  DDX/MDM/AP: Differential diagnosis includes, but is not limited to, patellar fracture, patellar tendon rupture, less likely tibial plateau fracture, consider ligamentous injury, sprain.  Plan: - X-ray bilateral knees  Patient's presentation is most consistent with acute presentation with potential threat to life or bodily function.  The patient is on the cardiac monitor to evaluate for evidence of arrhythmia and/or significant heart rate changes.  ED course below.  X-ray right knee concern for patellar tendon rupture.  Discussed with orthopedics, agrees with knee immobilizer and states plan to have general referral to outpatient orthopedics for evaluation.  Immobilizer placed, crutches provided, Ortho referral placed.  Rx Tylenol , oxycodone.  Discharged home.  Clinical Course as of 12/31/23 2324  Sun Dec 31, 2023  2202 XR b/l knees: IMPRESSION: 1. High riding position of the right patella with retraction of the patellar tendon, including corticated ossicles previously seen at the inferior patellar pole. Findings are consistent with patellar tendon  rupture. 2. No fracture. 3. Normal left knee.   [MM]  2202 CBC with mild leukocytosis, nonspecific [MM]  2218 Paging Ortho to discuss [MM]  2227 D/w Dr. Maryrose of orthopedics  - Agrees with knee immobilizer and outpatient follow-up in clinic, can just use general referral [MM]    Clinical Course User Index [MM] Clarine Ozell LABOR, MD     FINAL CLINICAL IMPRESSION(S) / ED DIAGNOSES   Final diagnoses:  Patellar tendon rupture, right, initial encounter     Rx / DC Orders   ED Discharge Orders          Ordered    Ambulatory referral to Orthopedic Surgery       Comments: Right patellar tendon rupture   12/31/23 2323    acetaminophen  (TYLENOL ) 500 MG tablet  Every 6 hours PRN        12/31/23 2323    oxyCODONE (ROXICODONE) 5 MG immediate release tablet  Every 8 hours PRN        12/31/23 2323             Note:  This document was prepared using Dragon voice recognition software and may include unintentional dictation errors.   Clarine Ozell LABOR, MD 12/31/23 2325

## 2024-01-01 ENCOUNTER — Ambulatory Visit: Admitting: Family Medicine

## 2024-01-01 DIAGNOSIS — S86811A Strain of other muscle(s) and tendon(s) at lower leg level, right leg, initial encounter: Secondary | ICD-10-CM | POA: Diagnosis not present

## 2024-01-01 DIAGNOSIS — Y92030 Kitchen in apartment as the place of occurrence of the external cause: Secondary | ICD-10-CM | POA: Diagnosis not present

## 2024-01-01 DIAGNOSIS — M223X1 Other derangements of patella, right knee: Secondary | ICD-10-CM | POA: Diagnosis not present

## 2024-01-01 DIAGNOSIS — M25561 Pain in right knee: Secondary | ICD-10-CM | POA: Diagnosis not present

## 2024-01-01 DIAGNOSIS — W010XXA Fall on same level from slipping, tripping and stumbling without subsequent striking against object, initial encounter: Secondary | ICD-10-CM | POA: Diagnosis not present

## 2024-01-01 MED ORDER — OXYCODONE HCL 5 MG PO TABS
5.0000 mg | ORAL_TABLET | Freq: Once | ORAL | Status: AC
  Administered 2024-01-01: 5 mg via ORAL
  Filled 2024-01-01: qty 1

## 2024-01-01 NOTE — ED Notes (Signed)
 This RN asked patient if she had gotten in touch with anyone and she replied let me check my text.  Informed patient that she needed to actually call.

## 2024-01-01 NOTE — ED Notes (Addendum)
 Pt requesting to speak to pt advocate, informed her there were none on duty at this time, but that I would speak to her as the charge nurse, pt states she lives in a townhome and concerned about using a knee immobilizer and crutches at home, per primary RN's Zack and Dorian the pt was able to ambulate independently with crutches in the room. Pt also states she doesn't want to call her emergency contacts because its so late, advised pt that she needs to call for a ride and that there are no taxi cabs running.  Informed pt that if she did not call her emergency contact then we would for her.

## 2024-01-01 NOTE — ED Notes (Signed)
 Assumed care of patient while she is attempting to arrange a ride home.

## 2024-01-01 NOTE — ED Notes (Addendum)
 Pt discharged at this time. RN reviewed discharge instructions with pt. Pt verbalized understanding.  RR even and unlabored. Pt denies any questions or needs at this time. Pt in wheelchair at discharge. Pt transported home by friend.

## 2024-01-01 NOTE — ED Notes (Signed)
 Patient up to restroom with minimal assist into wheelchair.  Patient was able to maneuver from wheelchair to toilet and back without assistance.  Placed back in recliner and given warm blanket.

## 2024-01-01 NOTE — ED Notes (Addendum)
 Pt ambulated in room with crutches. Discharge paperwork reviewed.  Pt hesitant to be discharged. Requesting pt advocate. Requesting to stay in room and get a good night sleep prior to discharge. Informed pt charge RN would be notified. Consulting civil engineer notified.

## 2024-01-01 NOTE — ED Notes (Signed)
 Patient has been making phone calls in attempt to get ride home.

## 2024-01-02 ENCOUNTER — Ambulatory Visit
Admission: RE | Admit: 2024-01-02 | Discharge: 2024-01-02 | Disposition: A | Discharge status: Home / Self Care | Source: Ambulatory Visit | Attending: Sports Medicine | Admitting: Sports Medicine

## 2024-01-02 ENCOUNTER — Other Ambulatory Visit: Payer: Self-pay | Admitting: Sports Medicine

## 2024-01-02 DIAGNOSIS — S86811A Strain of other muscle(s) and tendon(s) at lower leg level, right leg, initial encounter: Secondary | ICD-10-CM

## 2024-01-02 DIAGNOSIS — S8991XA Unspecified injury of right lower leg, initial encounter: Secondary | ICD-10-CM | POA: Insufficient documentation

## 2024-01-02 DIAGNOSIS — M25461 Effusion, right knee: Secondary | ICD-10-CM | POA: Diagnosis not present

## 2024-01-02 DIAGNOSIS — M7121 Synovial cyst of popliteal space [Baker], right knee: Secondary | ICD-10-CM | POA: Diagnosis not present

## 2024-01-02 DIAGNOSIS — S83511A Sprain of anterior cruciate ligament of right knee, initial encounter: Secondary | ICD-10-CM | POA: Diagnosis not present

## 2024-01-02 DIAGNOSIS — S838X1A Sprain of other specified parts of right knee, initial encounter: Secondary | ICD-10-CM | POA: Diagnosis not present

## 2024-01-03 ENCOUNTER — Encounter: Payer: Self-pay | Admitting: Family Medicine

## 2024-01-03 ENCOUNTER — Telehealth: Admitting: Physician Assistant

## 2024-01-03 ENCOUNTER — Encounter: Payer: Self-pay | Admitting: Physician Assistant

## 2024-01-03 DIAGNOSIS — S8990XA Unspecified injury of unspecified lower leg, initial encounter: Secondary | ICD-10-CM

## 2024-01-03 DIAGNOSIS — B852 Pediculosis, unspecified: Secondary | ICD-10-CM

## 2024-01-03 DIAGNOSIS — F22 Delusional disorders: Secondary | ICD-10-CM

## 2024-01-03 MED ORDER — IVERMECTIN 3 MG PO TABS: 200.0000 ug/kg | ORAL_TABLET | Freq: Once | ORAL | 0 refills | Status: AC

## 2024-01-03 NOTE — Telephone Encounter (Signed)
 Needs appt, can be virtual if needed.

## 2024-01-03 NOTE — Progress Notes (Signed)
 Virtual Visit Consent   Diamond Lucas, you are scheduled for a virtual visit with a [facility] provider today. Just as with appointments in the office, your consent must be obtained to participate. Your consent will be active for this visit and any virtual visit you may have with one of our providers in the next 365 days. If you have a MyChart account, a copy of this consent can be sent to you electronically.  As this is a virtual visit, video technology does not allow for your provider to perform a traditional examination. This may limit your provider's ability to fully assess your condition. If your provider identifies any concerns that need to be evaluated in person or the need to arrange testing (such as labs, EKG, etc.), we will make arrangements to do so. Although advances in technology are sophisticated, we cannot ensure that it will always work on either your end or our end. If the connection with a video visit is poor, the visit may have to be switched to a telephone visit. With either a video or telephone visit, we are not always able to ensure that we have a secure connection.  By engaging in this virtual visit, you consent to the provision of healthcare and authorize for your insurance to be billed (if applicable) for the services provided during this visit. Depending on your insurance coverage, you may receive a charge related to this service.  I need to obtain your verbal consent now. Are you willing to proceed with your visit today? Diamond Lucas has provided verbal consent on 01/03/2024 for a virtual visit (video or telephone). Delon CHRISTELLA Dickinson, PA-C  Date: 01/03/2024 1:15 PM   Virtual Visit via Video Note   I, Delon CHRISTELLA Dickinson, connected with  Diamond Lucas  ([id number], December 18, 1960) on 01/03/24 at 12:00 PM EDT by a video-enabled telemedicine application and verified that I am speaking with the correct person using two identifiers.  Location: Patient:  Virtual Visit Location Patient: Home Provider: Virtual Visit Location Provider: Home Office   I discussed the limitations of evaluation and management by telemedicine and the availability of in person appointments. The patient expressed understanding and agreed to proceed.    History of Present Illness: Diamond Lucas is a 63 y.o. who identifies as a female who was assigned female at birth, and is being seen today for possible lice infestation.   She was in the ER on 12/31/23 for a fall that caused a significant knee injury. She feels she may have been re-exposed there.  She was seen on 12/19/23 and treated with Ivermectin  for potential lice.   She feels she had symptom resolution after that treatment, but feels she may have been re-exposed.   Also, due to her knee injury she has not been able to perform self care of bathing. She has a tub/shower combo and reports she cannot safely lift her leg into the tub or sit herself in the tub. Thus she has not been able to wash her hair, nor can she use topical treatments for lice.  She is also asking about temporary placement in a rehab facility due to her knee injury to help her with these ADLs.   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 Farinae) Itching    Respiratory illness, and itchy eyes, eye problems Respiratory illness, and itchy eyes, eye problems   Nsaids Other (See Comments)    Other reaction(s): Other (See Comments) Aleve causes hives 10/15 pt states she occ. Takes Motrin . Other reaction(s): Other (See Comments) Aleve causes hives   Other Itching    Respiratory illness, and itchy eyes, eye problems Respiratory illness, and itchy eyes, eye problems   Risperidone     Synthroid  [Levothyroxine ]     Brand name only- causes acid reflux   Prednisolone     Eye drops   Medications:  Current Outpatient Medications:    ivermectin  (STROMECTOL ) 3 MG TABS tablet, Take 4 tablets (12,000 mcg total) by mouth once for 1 dose., Disp: 4 tablet, Rfl: 0   acetaminophen  (TYLENOL ) 500 MG tablet, Take 2 tablets (1,000 mg total) by mouth every 6 (six) hours as needed., Disp: 100 tablet, Rfl: 2   CALCIUM  CITRATE PO, Take by mouth., Disp: , Rfl:    Carboxymeth-Glyc-Polysorb PF 0.5-1-0.5 % SOLN, Apply 2 drops to eye 2 (two) times daily., Disp: , Rfl:    cyclobenzaprine  (FLEXERIL ) 5 MG tablet, Take 1 tablet (5 mg total) by mouth daily as needed for muscle spasms., Disp: 90 tablet, Rfl: 0   Ibuprofen  (MOTRIN  PO), Take by mouth as needed (liquid)., Disp: , Rfl:    Levothyroxine  Sodium (TIROSINT ) 50 MCG CAPS, Take 75 mcg by mouth daily., Disp: , Rfl:    Multiple Vitamin (MULTI-VITAMINS) TABS, daily. , Disp: , Rfl:    Omega-3 Fatty Acids (FISH OIL) 1000 MG CAPS, Take by mouth. Takes occasionally due to causing acid reflux, Disp: , Rfl:    oxyCODONE (ROXICODONE) 5 MG immediate release tablet, Take 1 tablet (5 mg total) by mouth every 8 (eight) hours as needed for breakthrough pain., Disp:  12 tablet, Rfl: 0   spironolactone (ALDACTONE) 25 MG tablet, Take 25 mg by mouth daily., Disp: , Rfl:    triamcinolone  (NASACORT ) 55 MCG/ACT AERO nasal inhaler, Place 2 sprays into the nose as needed. Once daily as needed., Disp: , Rfl:    UNABLE TO FIND, Take 1 capsule by mouth 2 (two) times daily. Med Name: VENEASE, Disp: , Rfl:   Observations/Objective: Patient is well-developed, well-nourished in no acute distress.  Resting comfortably at home.  Head is normocephalic, atraumatic.  No labored breathing.  Speech is clear and coherent with logical content.  Patient is alert and oriented at baseline.    Assessment and Plan: 1. Lice infestation (Primary) - ivermectin  (STROMECTOL ) 3 MG TABS tablet; Take 4 tablets (12,000 mcg total) by mouth once for 1 dose.  Dispense: 4 tablet; Refill: 0  2. Delusion of infestation (HCC)  3. Knee injury, initial encounter  - I have refilled  Ivermectin  once as above for possible lice re-exposure - Patient does have a documented history of delusion of infestation, so it is possible this is contributing - Also advised patient that I, unfortunately, could not assist with getting her into short term rehab for her knee injury, but since she is unable to safely perform ADLs and does not have family assistance she may be a candidate and to follow up with her PCP or Orthopedic provider to discuss this option - Seek in person evaluation if worsening  Follow Up Instructions: I discussed the assessment and treatment plan with the patient. The patient was provided an opportunity to ask questions and all were answered. The patient agreed with the plan and demonstrated an understanding of the instructions.  A copy of instructions were sent to the patient via MyChart unless otherwise noted below.    The patient was advised to call back or seek an in-person evaluation if the symptoms worsen or if the condition fails to improve as anticipated.    Delon CHRISTELLA Dickinson,  PA-C

## 2024-01-03 NOTE — Patient Instructions (Signed)
 Diamond Lucas, thank you for joining Delon CHRISTELLA Dickinson, PA-C for today's virtual visit.  While this provider is not your primary care provider (PCP), if your PCP is located in our provider database this encounter information will be shared with them immediately following your visit.   A [facility] MyChart account gives you access to today's visit and all your visits, tests, and labs performed at [facility]  click here if you don't have a [facility] MyChart account or go to [email or web address]  Consent: (Patient) Diamond Ivanoff Study provided verbal consent for this virtual visit at the beginning of the encounter.  Current Medications:  Current Outpatient Medications:    ivermectin  (STROMECTOL ) 3 MG TABS tablet, Take 4 tablets (12,000 mcg total) by mouth once for 1 dose., Disp: 4 tablet, Rfl: 0   acetaminophen  (TYLENOL ) 500 MG tablet, Take 2 tablets (1,000 mg total) by mouth every 6 (six) hours as needed., Disp: 100 tablet, Rfl: 2   CALCIUM  CITRATE PO, Take by mouth., Disp: , Rfl:    Carboxymeth-Glyc-Polysorb PF 0.5-1-0.5 % SOLN, Apply 2 drops to eye 2 (two) times daily., Disp: , Rfl:    cyclobenzaprine  (FLEXERIL ) 5 MG tablet, Take 1 tablet (5 mg total) by mouth daily as needed for muscle spasms., Disp: 90 tablet, Rfl: 0   Ibuprofen  (MOTRIN  PO), Take by mouth as needed (liquid)., Disp: , Rfl:    Levothyroxine  Sodium (TIROSINT ) 50 MCG CAPS, Take 75 mcg by mouth daily., Disp: , Rfl:    Multiple Vitamin (MULTI-VITAMINS) TABS, daily. , Disp: , Rfl:    Omega-3 Fatty Acids (FISH OIL) 1000 MG CAPS, Take by mouth. Takes occasionally due to causing acid reflux, Disp: , Rfl:    oxyCODONE (ROXICODONE) 5 MG immediate release tablet, Take 1 tablet (5 mg total) by mouth every 8 (eight) hours as needed for breakthrough pain., Disp: 12 tablet, Rfl: 0   spironolactone (ALDACTONE) 25 MG tablet, Take 25 mg by mouth daily., Disp: , Rfl:    triamcinolone  (NASACORT ) 55 MCG/ACT AERO  nasal inhaler, Place 2 sprays into the nose as needed. Once daily as needed., Disp: , Rfl:    UNABLE TO FIND, Take 1 capsule by mouth 2 (two) times daily. Med Name: VENEASE, Disp: , Rfl:    Medications ordered in this encounter:  Meds ordered this encounter  Medications   ivermectin  (STROMECTOL ) 3 MG TABS tablet    Sig: Take 4 tablets (12,000 mcg total) by mouth once for 1 dose.    Dispense:  4 tablet    Refill:  0    Supervising Provider:   LAMPTEY, PHILIP O [[id number]]     *If you need refills on other medications prior to your next appointment, please contact your pharmacy*  Follow-Up: Call back or seek an in-person evaluation if the symptoms worsen or if the condition fails to improve as anticipated.  [facility] [phone]    If you have been instructed to have an in-person evaluation today at a local Urgent Care facility, please use the link below. It will take you to a list of all of our available [facility], including address, phone number and hours of operation. Please do not delay care.  [facility]  If you or a family member do not have a primary care provider, use the link below to schedule a visit and establish care. When you choose a [facility] primary care physician or advanced practice provider, you gain a long-term partner  in health. Find a Primary Care Provider  Learn more about [facility] in-office and virtual care options: [facility] - Get Care Now

## 2024-01-05 ENCOUNTER — Telehealth: Payer: Self-pay | Admitting: Family Medicine

## 2024-01-05 NOTE — Telephone Encounter (Signed)
 Copied from CRM [id number]. Topic: General - Other >> Jan 05, 2024  5:37 PM Donee H wrote: Reason for CRM: Patient called to state she is in need of a hospital bed delivered to her home. She stated already spoke with insurance and was told it would be covered. She states told by surgeon to reach out to pcp to order bed. Patient states it doesn't have to be expensive and she doesn't care if frame is used she just need bed. Please follow back up with patient on request. [phone]

## 2024-01-08 ENCOUNTER — Other Ambulatory Visit: Payer: Self-pay | Admitting: Surgery

## 2024-01-08 DIAGNOSIS — S86811A Strain of other muscle(s) and tendon(s) at lower leg level, right leg, initial encounter: Secondary | ICD-10-CM | POA: Diagnosis not present

## 2024-01-08 NOTE — Telephone Encounter (Signed)
 Patient has purchased a bed and wants to hold off on the hospital bed

## 2024-01-08 NOTE — Telephone Encounter (Signed)
 Pt needs a visit to discuss needs.

## 2024-01-09 ENCOUNTER — Ambulatory Visit: Admitting: Anesthesiology

## 2024-01-09 ENCOUNTER — Encounter: Payer: Self-pay | Admitting: Surgery

## 2024-01-09 ENCOUNTER — Observation Stay
Admission: RE | Admit: 2024-01-09 | Discharge: 2024-01-16 | Disposition: A | Discharge status: Home / Self Care | Attending: Surgery | Admitting: Surgery

## 2024-01-09 ENCOUNTER — Other Ambulatory Visit: Payer: Self-pay

## 2024-01-09 ENCOUNTER — Encounter
Admission: RE | Disposition: A | Discharge status: Home / Self Care | Payer: Self-pay | Source: Home / Self Care | Attending: Surgery

## 2024-01-09 DIAGNOSIS — I10 Essential (primary) hypertension: Secondary | ICD-10-CM | POA: Diagnosis not present

## 2024-01-09 DIAGNOSIS — S86811A Strain of other muscle(s) and tendon(s) at lower leg level, right leg, initial encounter: Secondary | ICD-10-CM | POA: Diagnosis not present

## 2024-01-09 DIAGNOSIS — Z79899 Other long term (current) drug therapy: Secondary | ICD-10-CM | POA: Diagnosis not present

## 2024-01-09 DIAGNOSIS — Z01810 Encounter for preprocedural cardiovascular examination: Secondary | ICD-10-CM | POA: Diagnosis not present

## 2024-01-09 DIAGNOSIS — R262 Difficulty in walking, not elsewhere classified: Secondary | ICD-10-CM | POA: Diagnosis not present

## 2024-01-09 DIAGNOSIS — W010XXA Fall on same level from slipping, tripping and stumbling without subsequent striking against object, initial encounter: Secondary | ICD-10-CM | POA: Diagnosis not present

## 2024-01-09 DIAGNOSIS — M6281 Muscle weakness (generalized): Secondary | ICD-10-CM | POA: Insufficient documentation

## 2024-01-09 DIAGNOSIS — E039 Hypothyroidism, unspecified: Secondary | ICD-10-CM | POA: Insufficient documentation

## 2024-01-09 HISTORY — PX: PATELLAR TENDON REPAIR: SHX737

## 2024-01-09 LAB — POCT I-STAT, CHEM 8
BUN: 28 mg/dL — ABNORMAL HIGH (ref 8–23)
Calcium, Ion: 1.13 mmol/L — ABNORMAL LOW (ref 1.15–1.40)
Chloride: 102 mmol/L (ref 98–111)
Creatinine, Ser: 0.9 mg/dL (ref 0.44–1.00)
Glucose, Bld: 95 mg/dL (ref 70–99)
HCT: 39 % (ref 36.0–46.0)
Hemoglobin: 13.3 g/dL (ref 12.0–15.0)
Potassium: 4.6 mmol/L (ref 3.5–5.1)
Sodium: 138 mmol/L (ref 135–145)
TCO2: 25 mmol/L (ref 22–32)

## 2024-01-09 SURGERY — REPAIR, TENDON, PATELLAR
Anesthesia: General | Site: Knee | Laterality: Right

## 2024-01-09 MED ORDER — CEFAZOLIN SODIUM-DEXTROSE 2-4 GM/100ML-% IV SOLN
2.0000 g | INTRAVENOUS | Status: AC
  Administered 2024-01-09: 2 g via INTRAVENOUS

## 2024-01-09 MED ORDER — BUPIVACAINE-EPINEPHRINE (PF) 0.5% -1:200000 IJ SOLN
INTRAMUSCULAR | Status: AC
  Filled 2024-01-09: qty 30

## 2024-01-09 MED ORDER — ACETAMINOPHEN 10 MG/ML IV SOLN: 1000.0000 mg | Freq: Once | INTRAVENOUS | Status: DC | PRN

## 2024-01-09 MED ORDER — DEXAMETHASONE SOD PHOSPHATE PF 10 MG/ML IJ SOLN
INTRAMUSCULAR | Status: DC | PRN
  Administered 2024-01-09: 8 mg via INTRAVENOUS

## 2024-01-09 MED ORDER — DEXMEDETOMIDINE HCL IN NACL 80 MCG/20ML IV SOLN
INTRAVENOUS | Status: AC
  Filled 2024-01-09: qty 20

## 2024-01-09 MED ORDER — CEFAZOLIN SODIUM-DEXTROSE 2-4 GM/100ML-% IV SOLN
2.0000 g | Freq: Four times a day (QID) | INTRAVENOUS | Status: AC
  Administered 2024-01-09 (×2): 2 g via INTRAVENOUS
  Filled 2024-01-09 (×2): qty 100

## 2024-01-09 MED ORDER — CEFAZOLIN SODIUM-DEXTROSE 2-4 GM/100ML-% IV SOLN
INTRAVENOUS | Status: AC
  Filled 2024-01-09: qty 100

## 2024-01-09 MED ORDER — ONDANSETRON HCL 4 MG/2ML IJ SOLN
INTRAMUSCULAR | Status: AC
  Filled 2024-01-09: qty 2

## 2024-01-09 MED ORDER — FENTANYL CITRATE (PF) 100 MCG/2ML IJ SOLN
INTRAMUSCULAR | Status: AC
  Filled 2024-01-09: qty 2

## 2024-01-09 MED ORDER — OXYCODONE HCL 5 MG PO TABS
5.0000 mg | ORAL_TABLET | Freq: Once | ORAL | Status: AC | PRN
  Administered 2024-01-09: 5 mg via ORAL

## 2024-01-09 MED ORDER — OXYCODONE HCL 5 MG PO TABS
ORAL_TABLET | ORAL | Status: AC
  Filled 2024-01-09: qty 1

## 2024-01-09 MED ORDER — OXYCODONE HCL 5 MG/5ML PO SOLN: 5.0000 mg | Freq: Once | ORAL | Status: AC | PRN

## 2024-01-09 MED ORDER — LEVOTHYROXINE SODIUM 100 MCG PO TABS
100.0000 ug | ORAL_TABLET | Freq: Every day | ORAL | Status: DC
  Administered 2024-01-10 – 2024-01-14 (×5): 100 ug via ORAL
  Filled 2024-01-09 (×3): qty 1
  Filled 2024-01-09: qty 2
  Filled 2024-01-09 (×4): qty 1

## 2024-01-09 MED ORDER — ONDANSETRON HCL 4 MG PO TABS: 4.0000 mg | ORAL_TABLET | Freq: Four times a day (QID) | ORAL | Status: DC | PRN

## 2024-01-09 MED ORDER — SODIUM CHLORIDE 0.9 % IV SOLN: INTRAVENOUS | Status: AC

## 2024-01-09 MED ORDER — CHLORHEXIDINE GLUCONATE 0.12 % MT SOLN: 15.0000 mL | Freq: Once | OROMUCOSAL | Status: DC

## 2024-01-09 MED ORDER — ONDANSETRON HCL 4 MG/2ML IJ SOLN
INTRAMUSCULAR | Status: DC | PRN
  Administered 2024-01-09: 4 mg via INTRAVENOUS

## 2024-01-09 MED ORDER — MIDAZOLAM HCL 2 MG/2ML IJ SOLN
INTRAMUSCULAR | Status: AC
  Filled 2024-01-09: qty 2

## 2024-01-09 MED ORDER — ORAL CARE MOUTH RINSE: 15.0000 mL | Freq: Once | OROMUCOSAL | Status: DC

## 2024-01-09 MED ORDER — ONDANSETRON HCL 4 MG/2ML IJ SOLN: 4.0000 mg | Freq: Four times a day (QID) | INTRAMUSCULAR | Status: DC | PRN

## 2024-01-09 MED ORDER — PROPOFOL 500 MG/50ML IV EMUL
INTRAVENOUS | Status: DC | PRN
Start: 2024-01-09 — End: 2024-01-09
  Administered 2024-01-09: 125 ug/kg/min via INTRAVENOUS

## 2024-01-09 MED ORDER — DIPHENHYDRAMINE HCL 12.5 MG/5ML PO ELIX
12.5000 mg | ORAL_SOLUTION | ORAL | Status: DC | PRN
  Administered 2024-01-16: 25 mg via ORAL
  Filled 2024-01-09: qty 10

## 2024-01-09 MED ORDER — SPIRONOLACTONE 25 MG PO TABS
25.0000 mg | ORAL_TABLET | Freq: Every day | ORAL | Status: DC
  Administered 2024-01-09 – 2024-01-16 (×8): 25 mg via ORAL
  Filled 2024-01-09 (×8): qty 1

## 2024-01-09 MED ORDER — CHLORHEXIDINE GLUCONATE 0.12 % MT SOLN
OROMUCOSAL | Status: AC
  Filled 2024-01-09: qty 15

## 2024-01-09 MED ORDER — 0.9 % SODIUM CHLORIDE (POUR BTL) OPTIME
TOPICAL | Status: DC | PRN
  Administered 2024-01-09: 1000 mL

## 2024-01-09 MED ORDER — CARBOXYMETH-GLYC-POLYSORB PF 0.5-1-0.5 % OP SOLN: 2.0000 [drp] | Freq: Two times a day (BID) | OPHTHALMIC | Status: DC

## 2024-01-09 MED ORDER — FENTANYL CITRATE (PF) 250 MCG/5ML IJ SOLN
INTRAMUSCULAR | Status: AC
  Filled 2024-01-09: qty 5

## 2024-01-09 MED ORDER — FLEET ENEMA RE ENEM
1.0000 | ENEMA | Freq: Once | RECTAL | Status: AC | PRN
  Administered 2024-01-13: 1 via RECTAL

## 2024-01-09 MED ORDER — BISACODYL 10 MG RE SUPP
10.0000 mg | Freq: Every day | RECTAL | Status: DC | PRN
  Administered 2024-01-13 – 2024-01-16 (×4): 10 mg via RECTAL
  Filled 2024-01-09 (×4): qty 1

## 2024-01-09 MED ORDER — PROPOFOL 1000 MG/100ML IV EMUL
INTRAVENOUS | Status: AC
  Filled 2024-01-09: qty 100

## 2024-01-09 MED ORDER — ENOXAPARIN SODIUM 40 MG/0.4ML IJ SOSY
40.0000 mg | PREFILLED_SYRINGE | INTRAMUSCULAR | Status: DC
  Administered 2024-01-10 – 2024-01-16 (×7): 40 mg via SUBCUTANEOUS
  Filled 2024-01-09 (×7): qty 0.4

## 2024-01-09 MED ORDER — FENTANYL CITRATE (PF) 100 MCG/2ML IJ SOLN
INTRAMUSCULAR | Status: DC | PRN
  Administered 2024-01-09 (×2): 50 ug via INTRAVENOUS
  Administered 2024-01-09 (×2): 25 ug via INTRAVENOUS
  Administered 2024-01-09 (×2): 50 ug via INTRAVENOUS

## 2024-01-09 MED ORDER — ACETAMINOPHEN 500 MG PO TABS
1000.0000 mg | ORAL_TABLET | Freq: Four times a day (QID) | ORAL | Status: AC
  Administered 2024-01-09 – 2024-01-10 (×3): 1000 mg via ORAL
  Filled 2024-01-09 (×3): qty 2

## 2024-01-09 MED ORDER — TRIAMCINOLONE ACETONIDE 55 MCG/ACT NA AERO
2.0000 | INHALATION_SPRAY | Freq: Every day | NASAL | Status: DC | PRN
Start: 2024-01-09 — End: 2024-01-16

## 2024-01-09 MED ORDER — MIDAZOLAM HCL 2 MG/2ML IJ SOLN
INTRAMUSCULAR | Status: DC | PRN
Start: 2024-01-09 — End: 2024-01-09
  Administered 2024-01-09: 2 mg via INTRAVENOUS

## 2024-01-09 MED ORDER — BUPIVACAINE-EPINEPHRINE (PF) 0.5% -1:200000 IJ SOLN
INTRAMUSCULAR | Status: DC | PRN
  Administered 2024-01-09: 30 mL

## 2024-01-09 MED ORDER — METOCLOPRAMIDE HCL 5 MG/ML IJ SOLN: 5.0000 mg | Freq: Three times a day (TID) | INTRAMUSCULAR | Status: DC | PRN

## 2024-01-09 MED ORDER — DEXMEDETOMIDINE HCL IN NACL 80 MCG/20ML IV SOLN
INTRAVENOUS | Status: DC | PRN
  Administered 2024-01-09: 8 ug via INTRAVENOUS
  Administered 2024-01-09: 4 ug via INTRAVENOUS

## 2024-01-09 MED ORDER — DOCUSATE SODIUM 100 MG PO CAPS
100.0000 mg | ORAL_CAPSULE | Freq: Two times a day (BID) | ORAL | Status: DC
Start: 2024-01-09 — End: 2024-01-16
  Administered 2024-01-09 – 2024-01-16 (×11): 100 mg via ORAL
  Filled 2024-01-09 (×13): qty 1

## 2024-01-09 MED ORDER — FENTANYL CITRATE (PF) 100 MCG/2ML IJ SOLN
25.0000 ug | INTRAMUSCULAR | Status: DC | PRN
  Administered 2024-01-09 (×4): 25 ug via INTRAVENOUS

## 2024-01-09 MED ORDER — ADULT MULTIVITAMIN W/MINERALS CH
1.0000 | ORAL_TABLET | Freq: Every day | ORAL | Status: DC
Start: 2024-01-09 — End: 2024-01-16
  Administered 2024-01-09 – 2024-01-16 (×8): 1 via ORAL
  Filled 2024-01-09 (×8): qty 1

## 2024-01-09 MED ORDER — POLYVINYL ALCOHOL 1.4 % OP SOLN
2.0000 [drp] | Freq: Two times a day (BID) | OPHTHALMIC | Status: DC
  Administered 2024-01-15: 2 [drp] via OPHTHALMIC
  Filled 2024-01-09 (×2): qty 15

## 2024-01-09 MED ORDER — BUPIVACAINE LIPOSOME 1.3 % IJ SUSP
INTRAMUSCULAR | Status: AC
Start: 2024-01-09 — End: 2024-01-09
  Filled 2024-01-09: qty 10

## 2024-01-09 MED ORDER — MAGNESIUM HYDROXIDE 400 MG/5ML PO SUSP
30.0000 mL | Freq: Every day | ORAL | Status: DC | PRN
  Administered 2024-01-13: 30 mL via ORAL
  Filled 2024-01-09: qty 30

## 2024-01-09 MED ORDER — DROPERIDOL 2.5 MG/ML IJ SOLN: 0.6250 mg | Freq: Once | INTRAMUSCULAR | Status: DC | PRN

## 2024-01-09 MED ORDER — LACTATED RINGERS IV SOLN: INTRAVENOUS | Status: DC

## 2024-01-09 MED ORDER — CYCLOBENZAPRINE HCL 10 MG PO TABS
5.0000 mg | ORAL_TABLET | Freq: Every day | ORAL | Status: DC | PRN
  Administered 2024-01-09 – 2024-01-15 (×7): 5 mg via ORAL
  Filled 2024-01-09 (×8): qty 1

## 2024-01-09 MED ORDER — METOCLOPRAMIDE HCL 10 MG PO TABS: 5.0000 mg | ORAL_TABLET | Freq: Three times a day (TID) | ORAL | Status: DC | PRN

## 2024-01-09 MED ORDER — PROPOFOL 10 MG/ML IV BOLUS
INTRAVENOUS | Status: DC | PRN
  Administered 2024-01-09: 30 mg via INTRAVENOUS
  Administered 2024-01-09: 50 mg via INTRAVENOUS
  Administered 2024-01-09: 120 mg via INTRAVENOUS

## 2024-01-09 MED ORDER — OXYCODONE HCL 5 MG PO TABS
5.0000 mg | ORAL_TABLET | ORAL | Status: DC | PRN
  Administered 2024-01-10: 10 mg via ORAL
  Administered 2024-01-10: 5 mg via ORAL
  Administered 2024-01-11 – 2024-01-12 (×5): 10 mg via ORAL
  Administered 2024-01-12 (×3): 5 mg via ORAL
  Administered 2024-01-13 – 2024-01-14 (×2): 10 mg via ORAL
  Administered 2024-01-15 (×2): 5 mg via ORAL
  Administered 2024-01-16: 10 mg via ORAL
  Filled 2024-01-09: qty 1
  Filled 2024-01-09 (×2): qty 2
  Filled 2024-01-09 (×3): qty 1
  Filled 2024-01-09 (×2): qty 2
  Filled 2024-01-09: qty 1
  Filled 2024-01-09 (×5): qty 2
  Filled 2024-01-09: qty 1

## 2024-01-09 MED ORDER — ACETAMINOPHEN 325 MG PO TABS
325.0000 mg | ORAL_TABLET | Freq: Four times a day (QID) | ORAL | Status: DC | PRN
  Administered 2024-01-11 (×3): 650 mg via ORAL
  Administered 2024-01-12: 325 mg via ORAL
  Administered 2024-01-12: 650 mg via ORAL
  Administered 2024-01-12: 325 mg via ORAL
  Administered 2024-01-13 – 2024-01-14 (×5): 650 mg via ORAL
  Filled 2024-01-09 (×4): qty 2
  Filled 2024-01-09: qty 1
  Filled 2024-01-09 (×6): qty 2

## 2024-01-09 MED ORDER — PHENYLEPHRINE IN HARD FAT 0.25 % RE SUPP
1.0000 | Freq: Two times a day (BID) | RECTAL | Status: DC | PRN
  Filled 2024-01-09: qty 1

## 2024-01-09 MED ORDER — PROPOFOL 10 MG/ML IV BOLUS
INTRAVENOUS | Status: AC
  Filled 2024-01-09: qty 20

## 2024-01-09 MED ORDER — LIDOCAINE HCL (CARDIAC) PF 100 MG/5ML IV SOSY
PREFILLED_SYRINGE | INTRAVENOUS | Status: DC | PRN
  Administered 2024-01-09: 50 mg via INTRAVENOUS

## 2024-01-09 SURGICAL SUPPLY — 43 items
ANCHOR DBL 2.6 SLF-PNCH FIBRTK (Anchor) IMPLANT
ANCHOR QFIX 2.8 SUT MINI TAPE (Anchor) IMPLANT
BLADE SURG SZ10 CARB STEEL (BLADE) ×2 IMPLANT
BNDG COHESIVE 4X5 TAN STRL LF (GAUZE/BANDAGES/DRESSINGS) ×1 IMPLANT
BNDG ELASTIC 6INX 5YD STR LF (GAUZE/BANDAGES/DRESSINGS) ×1 IMPLANT
BNDG ESMARCH 6X12 STRL LF (GAUZE/BANDAGES/DRESSINGS) ×1 IMPLANT
BRACE KNEE POST OP SHORT (BRACE) ×1 IMPLANT
CHLORAPREP W/TINT 26 (MISCELLANEOUS) ×1 IMPLANT
COOLER ICEMAN CLASSIC (MISCELLANEOUS) ×1 IMPLANT
CUFF TRNQT CYL 34X4.125X (TOURNIQUET CUFF) IMPLANT
DRAPE SURG ORHT 6 SPLT 77X108 (DRAPES) ×2 IMPLANT
DRSG OPSITE POSTOP 4X6 (GAUZE/BANDAGES/DRESSINGS) IMPLANT
ELECTRODE REM PT RTRN 9FT ADLT (ELECTROSURGICAL) ×1 IMPLANT
GAUZE SPONGE 4X4 12PLY STRL (GAUZE/BANDAGES/DRESSINGS) ×1 IMPLANT
GAUZE XEROFORM 1X8 LF (GAUZE/BANDAGES/DRESSINGS) ×1 IMPLANT
GLOVE BIO SURGEON STRL SZ8 (GLOVE) ×2 IMPLANT
GLOVE INDICATOR 8.0 STRL GRN (GLOVE) ×1 IMPLANT
GLOVE SURG SYN 6.5 PF PI (GLOVE) IMPLANT
GOWN STRL REUS W/ TWL LRG LVL3 (GOWN DISPOSABLE) ×2 IMPLANT
GOWN STRL REUS W/ TWL XL LVL3 (GOWN DISPOSABLE) ×1 IMPLANT
GRAFT TISS SEMITEND 4-8 (Bone Implant) IMPLANT
KIT KNEE FIBERTAK DISP (KITS) IMPLANT
KIT SUTURE 2.8 Q-FIX DISP (MISCELLANEOUS) IMPLANT
KIT TURNOVER KIT A (KITS) ×1 IMPLANT
MANIFOLD NEPTUNE II (INSTRUMENTS) ×1 IMPLANT
NDL FILTER BLUNT 18X1 1/2 (NEEDLE) ×1 IMPLANT
NDL REVERSE CUT 1/2 CRC (NEEDLE) IMPLANT
NEEDLE FILTER BLUNT 18X1 1/2 (NEEDLE) ×1 IMPLANT
NEEDLE REVERSE CUT 1/2 CRC (NEEDLE) ×1 IMPLANT
NS IRRIG 500ML POUR BTL (IV SOLUTION) ×1 IMPLANT
PACK EXTREMITY ARMC (MISCELLANEOUS) ×1 IMPLANT
PAD COLD UNI WRAP-ON (PAD) IMPLANT
PENCIL SMOKE EVACUATOR (MISCELLANEOUS) ×1 IMPLANT
SPONGE T-LAP 18X18 ~~LOC~~+RFID (SPONGE) ×1 IMPLANT
STAPLER SKIN PROX 35W (STAPLE) ×1 IMPLANT
STOCKINETTE M/LG 89821 (MISCELLANEOUS) ×1 IMPLANT
SUT VIC AB 2-0 CT1 TAPERPNT 27 (SUTURE) ×2 IMPLANT
SUTURE 2 FIBERLOOP 20 STRT BLU (SUTURE) IMPLANT
SUTURE FIBERWR #2 38 BLUE 1/2 (SUTURE) IMPLANT
SYR 10ML LL (SYRINGE) ×1 IMPLANT
SYR 20ML LL LF (SYRINGE) ×1 IMPLANT
TRAP FLUID SMOKE EVACUATOR (MISCELLANEOUS) ×1 IMPLANT
WATER STERILE IRR 500ML POUR (IV SOLUTION) ×1 IMPLANT

## 2024-01-09 NOTE — Plan of Care (Signed)
   Problem: Safety: Goal: Ability to remain free from injury will improve Outcome: Progressing

## 2024-01-09 NOTE — Op Note (Signed)
 01/09/2024  1:00 PM  Patient:   Diamond Lucas  Pre-Op Diagnosis:   Acute patellar tendon rupture, right knee.  Post-Op Diagnosis:   Same.  Procedure:   Primary repair of acute patellar tendon rupture, right knee.  Surgeon:   DOROTHA Reyes Maltos, MD  Assistant:   None  Anesthesia:   General LMA  Findings:   As above.  Complications:   None  EBL:   5 cc  Fluids:   500 cc crystalloid  TT:   100 minutes at 300 mmHg  Drains:   None  Closure:   Staples  Implants:   Smith [employer] Nephew 2.8 mm Q-Fix anchors x 2, Arthrex 2.6 mm Double-loaded Knotless Knee FiberTak anchors x 2, semitendinosis autograft  Brief Clinical Note:   The patient is a 63 year old female who sustained the above-noted injury about 10 days ago as a result of a fall in her kitchen. She presented to the emergency room, then was referred to orthopedics. An MRI scans confirmed the above-noted injury. The patient presents at this time for definitive management of this injury.  Procedure:   The patient was brought into the operating room and lain in the supine position. After adequate general laryngeal mask anesthesia was obtained, the patient's right lower extremity was prepped with ChloraPrep solution before being draped sterilely. Preoperative antibiotics were administered. A timeout was performed to verify the appropriate surgical site before the limb was exsanguinated with an Esmarch and the tourniquet inflated to 300 mmHg.   An approximately 4-5 inch incision was made over the anterior aspect of the knee, centered over the patella. The incision was carried down through the subcutaneous tissues to expose the superficial retinaculum. This was split the length of the incision and the medial and lateral flaps elevated sufficiently to expose the patellar tendon. The patellar tendon was noted to have primarily ruptured from the inferior pole of the patella. After examining the tear pattern, it was elected to proceed with  primary repair. Two Smith [employer] Nephew 2.8 mm Q-Fix anchors were placed in the inferior pole of the patella.  Each of the two sets of sutures were woven in a self locking fashion through the patellar tendon tissue and tied securely to effect the primary repair.  Distally, two Arthrex 2.6 mm Double-loaded Knotless Knee FiberTak anchors were placed on either side of the tibial tubercle.  The allograft semitendinosis tendon had been thawed on the back table.  Both ends were whipstitched and then passed through the two sets of loops.  The loops were cinched tight and the two ends of the allograft were brought up the medial and lateral aspects of the patella tendon and woven through the more proximal retinaculum and into the quadriceps tendon before being tied to each other. The two sets of loop tails were then passed through the retinaculum adjacent to the medial and lateral margins of the patella and tied securely to act as an internal brace to reinforce the repair.    The medial and lateral retinacular tears were reapproximated using #0 Vicryl interrupted sutures.  Additional #0 Vicryl interrupted sutures were used to secure the allograft to the adjacent patella tendon and medial/lateral retinacular tissues.  In addition, some of the proximal patellar tendon tissue still attached to the patella where brought down over the repair to further reinforce the repair. Following this repair, the construct was stressed and found to be stable to gentle knee flexion to 90.  The wound was copiously irrigated with sterile  saline solution before the superficial retinacular layer was reapproximated using 2-0 Vicryl interrupted sutures. The subcutaneous tissues were closed using 2-0 Vicryl interrupted sutures before the skin was closed using staples. A solution of 10 cc of Exparel plus 20 cc of 0.5% Sensorcaine with epinephrine was injected in and around the incision site to help with postoperative analgesia before a sterile  occlusive dressing was applied to the knee. An Ace wrap was placed around the knee to accommodate a Polar Care pack before the patient was placed into a hinged knee brace with the hinges set at 0-90, but locked in extension. The patient was then awakened, extubated, and returned to the recovery room in satisfactory condition after tolerating the procedure well.
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
## 2024-01-09 NOTE — Transfer of Care (Signed)
 Immediate Anesthesia Transfer of Care Note  Patient: Diamond Lucas  Procedure(s) Performed: REPAIR, TENDON, PATELLAR (Right: Knee)  Patient Location: PACU  Anesthesia Type:General  Level of Consciousness: drowsy and patient cooperative  Airway & Oxygen Therapy: Patient Spontanous Breathing  Post-op Assessment: Report given to RN and Post -op Vital signs reviewed and stable  Post vital signs: stable  Last Vitals:  Vitals Value Taken Time  BP    Temp    Pulse    Resp    SpO2      Last Pain:  Vitals:   01/09/24 0836  PainSc: 0-No pain         Complications: No notable events documented.
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
## 2024-01-10 ENCOUNTER — Encounter: Payer: Self-pay | Admitting: Surgery

## 2024-01-10 DIAGNOSIS — R262 Difficulty in walking, not elsewhere classified: Secondary | ICD-10-CM | POA: Diagnosis not present

## 2024-01-10 DIAGNOSIS — Z79899 Other long term (current) drug therapy: Secondary | ICD-10-CM | POA: Diagnosis not present

## 2024-01-10 DIAGNOSIS — M6281 Muscle weakness (generalized): Secondary | ICD-10-CM | POA: Diagnosis not present

## 2024-01-10 DIAGNOSIS — E039 Hypothyroidism, unspecified: Secondary | ICD-10-CM | POA: Diagnosis not present

## 2024-01-10 DIAGNOSIS — S86811A Strain of other muscle(s) and tendon(s) at lower leg level, right leg, initial encounter: Secondary | ICD-10-CM | POA: Diagnosis not present

## 2024-01-10 DIAGNOSIS — I10 Essential (primary) hypertension: Secondary | ICD-10-CM | POA: Diagnosis not present

## 2024-01-10 MED ORDER — ENOXAPARIN SODIUM 40 MG/0.4ML IJ SOSY: 40.0000 mg | PREFILLED_SYRINGE | INTRAMUSCULAR | 0 refills | Status: AC

## 2024-01-10 MED ORDER — OXYCODONE HCL 5 MG PO TABS: 5.0000 mg | ORAL_TABLET | ORAL | 0 refills | Status: AC | PRN

## 2024-01-10 NOTE — Discharge Instructions (Signed)
 INSTRUCTIONS AFTER Surgery  Remove items at home which could result in a fall. This includes throw rugs or furniture in walking pathways ICE to the affected joint every three hours while awake for 30 minutes at a time, for at least the first 3-5 days, and then as needed for pain and swelling.  Continue to use ice for pain and swelling. You may notice swelling that will progress down to the foot and ankle.  This is normal after surgery.  Elevate your leg when you are not up walking on it.   Continue to use the breathing machine you got in the hospital (incentive spirometer) which will help keep your temperature down.  It is common for your temperature to cycle up and down following surgery, especially at night when you are not up moving around and exerting yourself.  The breathing machine keeps your lungs expanded and your temperature down.   DIET:  As you were doing prior to hospitalization, we recommend a well-balanced diet.  DRESSING / WOUND CARE / SHOWERING  No showering.  Knee immobilizer and dressing will stay in place until follow-up in 2 weeks.  ACTIVITY  Increase activity slowly as tolerated, but follow the weight bearing instructions below.   No driving for 6 weeks or until further direction given by your physician.  You cannot drive while taking narcotics.  No lifting or carrying greater than 10 lbs. until further directed by your surgeon. Avoid periods of inactivity such as sitting longer than an hour when not asleep. This helps prevent blood clots.  You may return to work once you are authorized by your doctor.     WEIGHT BEARING  Toe-touch weightbearing on the right leg with a walker.   EXERCISES No range of motion.  Right leg strengthening  CONSTIPATION  Constipation is defined medically as fewer than three stools per week and severe constipation as less than one stool per week.  Even if you have a regular bowel pattern at home, your normal regimen is likely to be  disrupted due to multiple reasons following surgery.  Combination of anesthesia, postoperative narcotics, change in appetite and fluid intake all can affect your bowels.   YOU MUST use at least one of the following options; they are listed in order of increasing strength to get the job done.  They are all available over the counter, and you may need to use some, POSSIBLY even all of these options:    Drink plenty of fluids (prune juice may be helpful) and high fiber foods Colace 100 mg by mouth twice a day  Senokot for constipation as directed and as needed Dulcolax (bisacodyl), take with full glass of water  Miralax (polyethylene glycol) once or twice a day as needed.  If you have tried all these things and are unable to have a bowel movement in the first 3-4 days after surgery call either your surgeon or your primary doctor.    If you experience loose stools or diarrhea, hold the medications until you stool forms back up.  If your symptoms do not get better within 1 week or if they get worse, check with your doctor.  If you experience the worst abdominal pain ever or develop nausea or vomiting, please contact the office immediately for further recommendations for treatment.   ITCHING:  If you experience itching with your medications, try taking only a single pain pill, or even half a pain pill at a time.  You can also use Benadryl over the  counter for itching or also to help with sleep.   TED HOSE STOCKINGS:  Use stockings on both legs until for at least 2 weeks or as directed by physician office. They may be removed at night for sleeping.  MEDICATIONS:  See your medication summary on the "After Visit Summary" that nursing will review with you.  You may have some home medications which will be placed on hold until you complete the course of blood thinner medication.  It is important for you to complete the blood thinner medication as prescribed.  PRECAUTIONS:  If you experience chest pain or  shortness of breath - call 911 immediately for transfer to the hospital emergency department.   If you develop a fever greater that 101 F, purulent drainage from wound, increased redness or drainage from wound, foul odor from the wound/dressing, or calf pain - CONTACT YOUR SURGEON.                                                   FOLLOW-UP APPOINTMENTS:  If you do not already have a post-op appointment, please call the office for an appointment to be seen by your surgeon.  Guidelines for how soon to be seen are listed in your "After Visit Summary", but are typically between 1-4 weeks after surgery.  OTHER INSTRUCTIONS:     MAKE SURE YOU:  Understand these instructions.  Get help right away if you are not doing well or get worse.    Thank you for letting us  be a part of your medical care team.  It is a privilege we respect greatly.  We hope these instructions will help you stay on track for a fast and full recovery!

## 2024-01-10 NOTE — Progress Notes (Signed)
   01/10/24 1530  Spiritual Encounters  Type of Visit Initial  Care provided to: Patient  Referral source Patient request ([place] Consult in the [facility] system.)  Reason for visit Routine spiritual support  OnCall Visit Yes   Chaplain visited patient per a [place] Consult in the [facility] system that said patient requests prayer.  Patient shared regarding hurt she's experienced in from her past Church.  She'd gotten hurt at work and didn't feel her [place] and former faith tradition showed up for her in her time of need.  Chaplain offered a compassionate presence and reflective listening.  Chaplain offered the patient the prayer she'd requested; however, patient said she wanted to focus on the dinner that was before her so, she asked if she could be visited tomorrow.  Chaplain said she'd ask her Thursday colleague(s) if they could return to visit the patient before she goes to PT.  Rev. Rana M. Nicholaus, M.Div. Chaplain Resident [facility]

## 2024-01-10 NOTE — Evaluation (Signed)
 Physical Therapy Evaluation Patient Details Name: Diamond Lucas MRN: [id number] DOB: September 13, 1960 Today's Date: 01/10/2024  History of Present Illness  63 y.o. female who presents for evaluation and treatment of her right knee pain and swelling following an injury which occurred on 12/31/2023.  Apparently she slipped on some grease and fell onto her knee while in her kitchen cooking.  She presented to the emergency room where examination was concerning for a patella tendon rupture.  Ultimately needing surgery having repair 11/ 14.  Clinical Impression  Pt was pleasant and willing to work with PT, she showed general anxiety about most aspects/precautions but with reinforcement and assurance was able to move reasonably well POD1.  She displayed ability to perform good QS and managed some AROM SLRs.  She was also able to do some limited ambulation but fatigued quickly (both UEs and cardiovascularly) with modest ~8 ft effort.  She showed good UE strength in using the walker and was able to maintain NWBing t/o mobility, cuing for appropriate TTWBing pt pt self selects NWBing.  Pt showed good effort and motivation, not safe to return home, will benefit from continued PT to address functional limitations.        If plan is discharge home, recommend the following: A little help with walking and/or transfers;A little help with bathing/dressing/bathroom;Assistance with cooking/housework;Assist for transportation;Help with stairs or ramp for entrance   Can travel by private vehicle   Yes (per KI positioning)    Equipment Recommendations  (STR plan, if home FWW and BSC/3in1)  Recommendations for Other Services       Functional Status Assessment Patient has had a recent decline in their functional status and demonstrates the ability to make significant improvements in function in a reasonable and predictable amount of time.     Precautions / Restrictions Precautions Precautions: Fall;Knee Recall  of Precautions/Restrictions: Intact Required Braces or Orthoses: Knee Immobilizer - Right Knee Immobilizer - Right:  (locked in extension) Restrictions Weight Bearing Restrictions Per Provider Order: Yes RLE Weight Bearing Per Provider Order: Touchdown weight bearing      Mobility  Bed Mobility Overal bed mobility: Needs Assistance Bed Mobility: Supine to Sit     Supine to sit: Min assist     General bed mobility comments: Assist with easing R LE off EOB, heavy cuing and reassurance t/o the transtion, pt hesitant    Transfers Overall transfer level: Needs assistance Equipment used: Rolling walker (2 wheels) Transfers: Sit to/from Stand Sit to Stand: Min assist           General transfer comment: heavy cuing for UE use, WBing/positioning precautions.  Pt did not need a lot of phyiscal assist but plenty of reinforcement and cuing for safety    Ambulation/Gait Ambulation/Gait assistance: Min assist, Contact guard assist Gait Distance (Feet): 8 Feet Assistive device: Rolling walker (2 wheels)         General Gait Details: Pt was able to maintain NWBing on R (cued repeatedly on TTWBing) with close CGA and she did relatively well with UE use and hopping with the walker.  Pt fatigued quickly but did not have any overt LOBs, light assist for safety to set up for sitting.  Stairs            Wheelchair Mobility     Tilt Bed    Modified Rankin (Stroke Patients Only)       Balance Overall balance assessment: Needs assistance Sitting-balance support: No upper extremity supported Sitting balance-Leahy Scale: Good  Standing balance support: Bilateral upper extremity supported Standing balance-Leahy Scale: Fair Standing balance comment: able to maintain WBing on R, mild unsteadiness in standing with anxious effort                             Pertinent Vitals/Pain Pain Assessment Pain Assessment: 0-10 Pain Score: 5  Pain Location: reports  chronic L heel/foot pain is as bad as the R knee    Home Living Family/patient expects to be discharged to:: Skilled nursing facility Living Arrangements: Alone                 Additional Comments: Pt reports church friends have been helping since fall, but does not have 24/7 option.  Bedroom is up flight of steps    Prior Function Prior Level of Function : Independent/Modified Independent             Mobility Comments: Pt reports she is able to run her errands and manage the home w/o assist ADLs Comments: independent     Extremity/Trunk Assessment   Upper Extremity Assessment Upper Extremity Assessment: Overall WFL for tasks assessed    Lower Extremity Assessment Lower Extremity Assessment: Overall WFL for tasks assessed (R LE in KI, able to SLR and do APs)       Communication   Communication Communication: No apparent difficulties    Cognition Arousal: Alert Behavior During Therapy: WFL for tasks assessed/performed   PT - Cognitive impairments: No apparent impairments                       PT - Cognition Comments: at times making tangentail comments but generally on task and able to fully participate appropriately Following commands: Intact       Cueing Cueing Techniques: Verbal cues, Visual cues, Gestural cues     General Comments General comments (skin integrity, edema, etc.): Pt anxious about most aspects of mobility, reinfoced WBing and general precuations t/o the session    Exercises General Exercises - Lower Extremity Ankle Circles/Pumps: AROM, 10 reps Quad Sets: Strengthening, 10 reps Hip ABduction/ADduction: AROM, Strengthening, 10 reps Straight Leg Raises: AROM, 10 reps   Assessment/Plan    PT Assessment Patient needs continued PT services  PT Problem List Decreased strength;Decreased range of motion;Decreased activity tolerance;Decreased balance;Decreased mobility;Decreased coordination;Decreased safety awareness;Decreased  knowledge of precautions;Decreased knowledge of use of DME;Pain       PT Treatment Interventions DME instruction;Gait training;Functional mobility training;Therapeutic activities;Therapeutic exercise;Balance training;Neuromuscular re-education;Cognitive remediation;Patient/family education;Stair training    PT Goals (Current goals can be found in the Care Plan section)  Acute Rehab PT Goals Patient Stated Goal: Get walking normally again PT Goal Formulation: With patient Time For Goal Achievement: 01/23/24 Potential to Achieve Goals: Good    Frequency 7X/week     Co-evaluation               AM-PAC PT 6 Clicks Mobility  Outcome Measure Help needed turning from your back to your side while in a flat bed without using bedrails?: A Little Help needed moving from lying on your back to sitting on the side of a flat bed without using bedrails?: A Little Help needed moving to and from a bed to a chair (including a wheelchair)?: A Little Help needed standing up from a chair using your arms (e.g., wheelchair or bedside chair)?: A Little Help needed to walk in hospital room?: A Lot Help needed climbing 3-5 steps with  a railing? : Total 6 Click Score: 15    End of Session Equipment Utilized During Treatment: Gait belt;Right knee immobilizer Activity Tolerance: Patient tolerated treatment well;Patient limited by fatigue Patient left: in chair;with call bell/phone within reach Nurse Communication: Mobility status PT Visit Diagnosis: Unsteadiness on feet (R26.81);Muscle weakness (generalized) (M62.81);Difficulty in walking, not elsewhere classified (R26.2);Pain Pain - Right/Left: Right Pain - part of body: Knee    Time: 9149-9064 PT Time Calculation (min) (ACUTE ONLY): 45 min   Charges:   PT Evaluation $PT Eval Low Complexity: 1 Low PT Treatments $Therapeutic Exercise: 8-22 mins $Therapeutic Activity: 8-22 mins PT General Charges $$ ACUTE PT VISIT: 1 Visit          Carmin JONELLE Deed, DPT 01/10/2024, 12:05 PM

## 2024-01-10 NOTE — Progress Notes (Signed)
  Subjective: 1 Day Post-Op Procedure(s) (LRB): REPAIR, TENDON, PATELLAR (Right) Patient reports pain as mild.   Patient is well, and has had no acute complaints or problems Plan is to go Rehab after hospital stay. Negative for chest pain and shortness of breath Fever: no Gastrointestinal: Negative for nausea and vomiting  Objective: Vital signs in last 24 hours: Temp:  [96.9 F (36.1 C)-98.7 F (37.1 C)] 98.2 F (36.8 C) (10/15 0435) Pulse Rate:  [71-94] 71 (10/15 0435) Resp:  [0-18] 15 (10/15 0435) BP: (117-162)/(64-94) 162/94 (10/15 0435) SpO2:  [95 %-100 %] 98 % (10/15 0435)  Intake/Output from previous day:  Intake/Output Summary (Last 24 hours) at 01/10/2024 0719 Last data filed at 01/09/2024 1727 Gross per 24 hour  Intake 815.23 ml  Output 405 ml  Net 410.23 ml    Intake/Output this shift: No intake/output data recorded.  Labs: Recent Labs    01/09/24 0909  HGB 13.3   Recent Labs    01/09/24 0909  HCT 39.0   Recent Labs    01/09/24 0909  NA 138  K 4.6  CL 102  BUN 28*  CREATININE 0.90  GLUCOSE 95   No results for input(s): LABPT, INR in the last 72 hours.   EXAM General - Patient is Alert and Oriented Extremity - Neurovascular intact Sensation intact distally Dorsiflexion/Plantar flexion intact Dressing/Incision - clean, dry, no drainage with the knee immobilizer in place and locked in extension. Motor Function - intact, moving foot and toes well on exam.   Past Medical History:  Diagnosis Date   Allergy    Anxiety 2004   see a psychotherapist   Arrhythmia    Cancer (HCC) 1994   metastatic thyroid  cancer   Cervical radiculopathy 03/09/2021   Depression 2004   see a psychotherapist   GERD (gastroesophageal reflux disease) 2021   from pregnancy or Synthroid ?   Heart murmur re-occuring 2021   visit with Cardiologist this month   History of chicken pox    History of measles    History of syncope    Hypertension     Osteoarthritis    Palpitations    Pure hypercholesterolemia    Rosacea    Schizophrenia (HCC)    Thyroid  disease    Ulcer 2021   yes possibly but not sure    Assessment/Plan: 1 Day Post-Op Procedure(s) (LRB): REPAIR, TENDON, PATELLAR (Right) Principal Problem:   Rupture of right patellar tendon  Estimated body mass index is 27.34 kg/m as calculated from the following:   Height as of 12/31/23: 5' (1.524 m).   Weight as of 12/31/23: 63.5 kg. Advance diet Up with therapy D/C IV fluids   discharge planning: Plan to discharge to rehab based on her home situation.  Physical therapy and patient with therapy today.  Leave knee immobilizer locked in extension.  Toe-touch weight-bear.  Follow-up with [facility] clinic orthopedics in 2 weeks.    DVT Prophylaxis - Lovenox Toe-touch Weight-Bearing to right leg  Krystal Doyne, PA-C Orthopaedic Surgery 01/10/2024, 7:19 AM

## 2024-01-10 NOTE — Plan of Care (Signed)
   Problem: Safety: Goal: Ability to remain free from injury will improve Outcome: Progressing

## 2024-01-10 NOTE — TOC Initial Note (Signed)
 Transition of Care [facility]) - Initial/Assessment Note    Patient Details  Name: Diamond Lucas MRN: [id number] Date of Birth: 1960-09-13  Transition of [facility]) CM/SW Contact:    Alvaro Louder, LCSW Phone Number: 01/10/2024, 4:28 PM  Clinical Narrative:    Per Chart review patient from home PCP is Curtis Boom LCSWA Faxed out information to SNF's in [place]. LCSWA will present Facilities to patient at the bedside tomorrow.            TOC to follow for discharge       Patient Goals and CMS Choice            Expected Discharge Plan and Services                                              Prior Living Arrangements/Services                       Activities of Daily Living   ADL Screening (condition at time of admission) Independently performs ADLs?: Yes (appropriate for developmental age) Is the patient deaf or have difficulty hearing?: No Does the patient have difficulty seeing, even when wearing glasses/contacts?: No Does the patient have difficulty concentrating, remembering, or making decisions?: No  Permission Sought/Granted                  Emotional Assessment              Admission diagnosis:  Rupture, tendon, patellar, right, initial encounter [D13.188J] Rupture of right patellar tendon [D13.188J] Patient Active Problem List   Diagnosis Date Noted   Rupture of right patellar tendon 01/09/2024   Lice 12/19/2023   Rectal polyp 07/27/2023   History of colon polyps 03/08/2023   Breast asymmetry 03/08/2023   Cervical high risk human papillomavirus (HPV) DNA test positive 06/13/2022   Thyroid  neoplasm malignant (HCC) 07/05/2021   Paranoid schizophrenia (HCC) 04/28/2021   Varicose veins with pain 04/23/2018   Polyp of sigmoid colon    Benign neoplasm of cecum    Chronic venous insufficiency 10/07/2017   Delusion of infestation (HCC) 09/15/2016   Postoperative hypothyroidism 05/17/2016   Bipolar I disorder, most  recent episode manic, severe with psychotic features (HCC) 02/17/2016   Delusional disorder, somatic type (HCC) 10/07/2015   HTN (hypertension) 10/07/2015   PCP:  Boom Curtis LABOR, FNP Pharmacy:   [id number] [place], [place] - [address] AT [facility] OF [address] [place] [address] Phone: [phone] Fax: [phone]  [id number] - [place], [place] - [address] [place] [address] Phone: [phone] Fax: [phone]     Social Drivers of Health (SDOH) Social History: SDOH Screenings   Food Insecurity: Food Insecurity Present (01/09/2024)  Housing: Low Risk  (01/09/2024)  Transportation Needs: No Transportation Needs (01/09/2024)  Utilities: Not At Risk (01/09/2024)  Alcohol Screen: Low Risk  (09/06/2023)  Depression (PHQ2-9): Low Risk  (12/19/2023)  Financial Resource Strain: High Risk (11/30/2023)  Physical Activity: Inactive (11/30/2023)  Social Connections: Moderately Integrated (11/30/2023)  Recent Concern: Social Connections - Moderately Isolated (09/06/2023)  Stress: No Stress Concern Present (11/30/2023)  Tobacco Use: Low Risk  (01/09/2024)  Health Literacy: Adequate Health Literacy (09/06/2023)   SDOH Interventions:     Readmission Risk Interventions  No data to display

## 2024-01-10 NOTE — Plan of Care (Signed)
 Documented

## 2024-01-10 NOTE — NC FL2 (Signed)
 [place]  MEDICAID FL2 LEVEL OF CARE FORM     IDENTIFICATION  Patient Name: Diamond Lucas Birthdate: [birth year] Sex: female Admission Date (Current Location): 01/09/2024  [facility] and [place] Number:  Chiropodist and Address:  [facility], [address] [place], [place] [address]      Provider Number: [id number]  Attending Physician Name and Address:  Edie Norleen PARAS, MD  Relative Name and Phone Number:       Current Level of Care: Hospital Recommended Level of Care: Skilled Nursing Facility Prior Approval Number:    Date Approved/Denied:   PASRR Number:    Discharge Plan: SNF    Current Diagnoses: Patient Active Problem List   Diagnosis Date Noted   Rupture of right patellar tendon 01/09/2024   Lice 12/19/2023   Rectal polyp 07/27/2023   History of colon polyps 03/08/2023   Breast asymmetry 03/08/2023   Cervical high risk human papillomavirus (HPV) DNA test positive 06/13/2022   Thyroid  neoplasm malignant (HCC) 07/05/2021   Paranoid schizophrenia (HCC) 04/28/2021   Varicose veins with pain 04/23/2018   Polyp of sigmoid colon    Benign neoplasm of cecum    Chronic venous insufficiency 10/07/2017   Delusion of infestation (HCC) 09/15/2016   Postoperative hypothyroidism 05/17/2016   Bipolar I disorder, most recent episode manic, severe with psychotic features (HCC) 02/17/2016   Delusional disorder, somatic type (HCC) 10/07/2015   HTN (hypertension) 10/07/2015    Orientation RESPIRATION BLADDER Height & Weight     Self, Time, Situation, Place  Normal Continent Weight:   Height:     BEHAVIORAL SYMPTOMS/MOOD NEUROLOGICAL BOWEL NUTRITION STATUS      Continent Diet (Heart)  AMBULATORY STATUS COMMUNICATION OF NEEDS Skin   Limited Assist Verbally Surgical wounds (Surgical incision Right Knee)                       Personal Care Assistance Level of Assistance  Bathing, Dressing, Feeding Bathing Assistance: Limited  assistance Feeding assistance: Independent Dressing Assistance: Limited assistance     Functional Limitations Info  Sight, Hearing, Speech Sight Info: Adequate Hearing Info: Adequate Speech Info: Adequate    SPECIAL CARE FACTORS FREQUENCY  PT (By licensed PT), OT (By licensed OT)     PT Frequency: 5x/week OT Frequency: 5x/week            Contractures      Additional Factors Info  Code Status, Allergies Code Status Info: Full Allergies Info: Diclofenac Sodium, Naproxen, Olanzapine , Gluten Meal, Mite (D. Farinae), Nsaids, Other, Risperidone , Synthroid  (Levothyroxine ), Prednisolone           Current Medications (01/10/2024):  This is the current hospital active medication list Current Facility-Administered Medications  Medication Dose Route Frequency Provider Last Rate Last Admin   acetaminophen  (TYLENOL ) tablet 1,000 mg  1,000 mg Oral Q6H Poggi, John J, MD   1,000 mg at 01/10/24 1208   acetaminophen  (TYLENOL ) tablet 325-650 mg  325-650 mg Oral Q6H PRN Poggi, John J, MD       artificial tears ophthalmic solution 2 drop  2 drop Both Eyes BID Merrill, Kristin A, RPH       bisacodyl (DULCOLAX) suppository 10 mg  10 mg Rectal Daily PRN Poggi, John J, MD       cyclobenzaprine  (FLEXERIL ) tablet 5 mg  5 mg Oral Daily PRN Poggi, John J, MD   5 mg at 01/09/24 2148   diphenhydrAMINE (BENADRYL) 12.5 MG/5ML elixir 12.5-25 mg  12.5-25  mg Oral Q4H PRN Poggi, John J, MD       docusate sodium  (COLACE) capsule 100 mg  100 mg Oral BID Poggi, John J, MD   100 mg at 01/10/24 0954   enoxaparin (LOVENOX) injection 40 mg  40 mg Subcutaneous Q24H Poggi, John J, MD   40 mg at 01/10/24 0830   levothyroxine  (SYNTHROID ) tablet 100 mcg  100 mcg Oral Daily Poggi, John J, MD   100 mcg at 01/10/24 0442   magnesium  hydroxide (MILK OF MAGNESIA) suspension 30 mL  30 mL Oral Daily PRN Poggi, John J, MD       metoCLOPramide (REGLAN) tablet 5-10 mg  5-10 mg Oral Q8H PRN Poggi, John J, MD       Or    metoCLOPramide (REGLAN) injection 5-10 mg  5-10 mg Intravenous Q8H PRN Poggi, John J, MD       multivitamin with minerals tablet 1 tablet  1 tablet Oral Daily Poggi, Norleen PARAS, MD   1 tablet at 01/10/24 9045   ondansetron  (ZOFRAN ) tablet 4 mg  4 mg Oral Q6H PRN Poggi, John J, MD       Or   ondansetron  (ZOFRAN ) injection 4 mg  4 mg Intravenous Q6H PRN Poggi, John J, MD       oxyCODONE (Oxy IR/ROXICODONE) immediate release tablet 5-10 mg  5-10 mg Oral Q4H PRN Poggi, John J, MD       phenylephrine ((USE for PREPARATION-H)) 0.25 % suppository 1 suppository  1 suppository Rectal BID PRN Poggi, Norleen PARAS, MD       sodium phosphate (FLEET) enema 1 enema  1 enema Rectal Once PRN Poggi, John J, MD       spironolactone (ALDACTONE) tablet 25 mg  25 mg Oral Daily Poggi, John J, MD   25 mg at 01/10/24 9045   triamcinolone  (NASACORT ) nasal inhaler 2 spray  2 spray Nasal Daily PRN Poggi, Norleen PARAS, MD         Discharge Medications: Please see discharge summary for a list of discharge medications.  Relevant Imaging Results:  Relevant Lab Results:   Additional Information SSN [id number]  Alvaro Louder, LCSW

## 2024-01-10 NOTE — Anesthesia Postprocedure Evaluation (Signed)
 Anesthesia Post Note  Patient: Diamond Lucas  Procedure(s) Performed: REPAIR, TENDON, PATELLAR (Right: Knee)  Patient location during evaluation: PACU Anesthesia Type: General Level of consciousness: awake and alert Pain management: pain level controlled Vital Signs Assessment: post-procedure vital signs reviewed and stable Respiratory status: spontaneous breathing, nonlabored ventilation, respiratory function stable and patient connected to nasal cannula oxygen Cardiovascular status: blood pressure returned to baseline and stable Postop Assessment: no apparent nausea or vomiting Anesthetic complications: no   No notable events documented.   Last Vitals:  Vitals:   01/10/24 0435 01/10/24 0726  BP: (!) 162/94 (!) 148/76  Pulse: 71 73  Resp: 15 17  Temp: 36.8 C 36.8 C  SpO2: 98% 99%    Last Pain:  Vitals:   01/10/24 0726  TempSrc: Oral  PainSc:                  [name] KANDICE Clause

## 2024-01-11 DIAGNOSIS — E039 Hypothyroidism, unspecified: Secondary | ICD-10-CM | POA: Diagnosis not present

## 2024-01-11 DIAGNOSIS — Z79899 Other long term (current) drug therapy: Secondary | ICD-10-CM | POA: Diagnosis not present

## 2024-01-11 DIAGNOSIS — S86811A Strain of other muscle(s) and tendon(s) at lower leg level, right leg, initial encounter: Secondary | ICD-10-CM | POA: Diagnosis not present

## 2024-01-11 DIAGNOSIS — I10 Essential (primary) hypertension: Secondary | ICD-10-CM | POA: Diagnosis not present

## 2024-01-11 DIAGNOSIS — M6281 Muscle weakness (generalized): Secondary | ICD-10-CM | POA: Diagnosis not present

## 2024-01-11 DIAGNOSIS — R262 Difficulty in walking, not elsewhere classified: Secondary | ICD-10-CM | POA: Diagnosis not present

## 2024-01-11 MED ORDER — AMLODIPINE BESYLATE 5 MG PO TABS
5.0000 mg | ORAL_TABLET | Freq: Every day | ORAL | Status: DC
  Administered 2024-01-11 – 2024-01-16 (×6): 5 mg via ORAL
  Filled 2024-01-11 (×6): qty 1

## 2024-01-11 NOTE — Progress Notes (Signed)
 Physical Therapy Treatment Patient Details Name: Diamond Lucas MRN: [id number] DOB: 16-May-1960 Today's Date: 01/11/2024   History of Present Illness 63 y.o. female who presents for evaluation and treatment of her right knee pain and swelling following an injury which occurred on 12/31/2023.  Apparently she slipped on some grease and fell onto her knee while in her kitchen cooking.  She presented to the emergency room where examination was concerning for a patella tendon rupture.  Ultimately needing surgery having repair 11/ 14.    PT Comments  Pt with increased pain and anxiety today, though she was also sleepy and reports that flexeril  seems to have made her lethargic.  She needed increased cuing, encouragement and reinforcement today t/o the session and did not have the same confidence and effort with exercises (unable to AROM SLR).  She was hesitant to do a lot of standing tasks but agreed to some minimal fwd/back hopping steps near bed.  Pt will benefit from ongoing PT, continue with POC.      If plan is discharge home, recommend the following: A little help with walking and/or transfers;A little help with bathing/dressing/bathroom;Assistance with cooking/housework;Assist for transportation;Help with stairs or ramp for entrance   Can travel by private vehicle     No  Equipment Recommendations   (TBD at rehab)    Recommendations for Other Services       Precautions / Restrictions Precautions Precautions: Fall;Knee Recall of Precautions/Restrictions: Intact Required Braces or Orthoses: Knee Immobilizer - Right Knee Immobilizer - Right:  (locked in extension) Restrictions Weight Bearing Restrictions Per Provider Order: Yes RLE Weight Bearing Per Provider Order: Touchdown weight bearing     Mobility  Bed Mobility Overal bed mobility: Needs Assistance Bed Mobility: Supine to Sit, Sit to Supine     Supine to sit: Mod assist, Max assist Sit to supine: Max assist    General bed mobility comments: struggled to move R LE, considerable assist to/from EOB    Transfers Overall transfer level: Needs assistance Equipment used: Rolling walker (2 wheels) Transfers: Sit to/from Stand Sit to Stand: Min assist, Mod assist           General transfer comment: heavy cuing for UE use, WBing/positioning precautions.  Increased phyiscal assist today with plenty of reinforcement and cuing for safety    Ambulation/Gait Ambulation/Gait assistance: Min assist, Contact guard assist Gait Distance (Feet): 8 Feet (forward/backward 4 ft X 2) Assistive device: Rolling walker (2 wheels)         General Gait Details: again self selects fully NWBing on the R, showed good effort with UEs on walker but quick to fatigue and generally feeling poorly - eager to return to sitting after modest effort.   Stairs             Wheelchair Mobility     Tilt Bed    Modified Rankin (Stroke Patients Only)       Balance Overall balance assessment: Needs assistance Sitting-balance support: No upper extremity supported Sitting balance-Leahy Scale: Good     Standing balance support: Bilateral upper extremity supported Standing balance-Leahy Scale: Fair Standing balance comment: able to maintain NWBing on R, mild unsteadiness in standing with anxious effort                            Communication Communication Communication: No apparent difficulties  Cognition Arousal: Lethargic, Suspect due to medications Behavior During Therapy: Anxious   PT - Cognitive impairments: No  apparent impairments                       PT - Cognition Comments: at times making tangentail comments but generally on task and able to participate despite lethargy Following commands: Intact      Cueing Cueing Techniques: Verbal cues, Visual cues, Gestural cues  Exercises General Exercises - Lower Extremity Ankle Circles/Pumps: AROM, 10 reps Quad Sets:  Strengthening, 10 reps Hip ABduction/ADduction: AAROM, 10 reps Straight Leg Raises: AAROM, 10 reps (unable to  AROM SLR)    General Comments        Pertinent Vitals/Pain Pain Assessment Pain Score: 7  Pain Location: reporting more L knee/quad pain today than POD1    Home Living                          Prior Function            PT Goals (current goals can now be found in the care plan section) Progress towards PT goals: Progressing toward goals (slow progress)    Frequency    7X/week      PT Plan      Co-evaluation              AM-PAC PT 6 Clicks Mobility   Outcome Measure  Help needed turning from your back to your side while in a flat bed without using bedrails?: A Little Help needed moving from lying on your back to sitting on the side of a flat bed without using bedrails?: A Lot Help needed moving to and from a bed to a chair (including a wheelchair)?: A Lot Help needed standing up from a chair using your arms (e.g., wheelchair or bedside chair)?: A Lot Help needed to walk in hospital room?: A Lot Help needed climbing 3-5 steps with a railing? : Total 6 Click Score: 12    End of Session Equipment Utilized During Treatment: Gait belt;Right knee immobilizer Activity Tolerance: Patient tolerated treatment well;Patient limited by fatigue Patient left: with bed alarm set;with call bell/phone within reach Nurse Communication: Mobility status PT Visit Diagnosis: Unsteadiness on feet (R26.81);Muscle weakness (generalized) (M62.81);Difficulty in walking, not elsewhere classified (R26.2);Pain Pain - Right/Left: Right Pain - part of body: Knee     Time: 1002-1028 PT Time Calculation (min) (ACUTE ONLY): 26 min  Charges:    $Therapeutic Exercise: 8-22 mins $Therapeutic Activity: 8-22 mins PT General Charges $$ ACUTE PT VISIT: 1 Visit                     Carmin JONELLE Deed, DPT 01/11/2024, 1:21 PM

## 2024-01-11 NOTE — Progress Notes (Signed)
  Subjective: 2 Days Post-Op Procedure(s) (LRB): REPAIR, TENDON, PATELLAR (Right) Patient reports pain as mild.   Patient is well, and has had no acute complaints or problems Plan is to go Rehab after hospital stay. Negative for chest pain and shortness of breath Fever: no Gastrointestinal: Negative for nausea and vomiting  Objective: Vital signs in last 24 hours: Temp:  [98.2 F (36.8 C)-98.4 F (36.9 C)] 98.4 F (36.9 C) (10/15 2341) Pulse Rate:  [77-81] 81 (10/15 2341) Resp:  [16-17] 16 (10/15 2341) BP: (155-173)/(85-87) 173/85 (10/15 2341) SpO2:  [98 %-100 %] 100 % (10/15 2341)  Intake/Output from previous day: No intake or output data in the 24 hours ending 01/11/24 0744   Intake/Output this shift: No intake/output data recorded.  Labs: Recent Labs    01/09/24 0909  HGB 13.3   Recent Labs    01/09/24 0909  HCT 39.0   Recent Labs    01/09/24 0909  NA 138  K 4.6  CL 102  BUN 28*  CREATININE 0.90  GLUCOSE 95   No results for input(s): LABPT, INR in the last 72 hours.   EXAM General - Patient is Alert and Oriented Extremity - Neurovascular intact Sensation intact distally Dorsiflexion/Plantar flexion intact Dressing/Incision - clean, dry, no drainage with the knee immobilizer in place and locked in extension. Motor Function - intact, moving foot and toes well on exam.  She ambulated 8 feet with physical therapy  Past Medical History:  Diagnosis Date   Allergy    Anxiety 2004   see a psychotherapist   Arrhythmia    Cancer [facility]) 1994   metastatic thyroid  cancer   Cervical radiculopathy 03/09/2021   Depression 2004   see a psychotherapist   GERD (gastroesophageal reflux disease) 2021   from pregnancy or Synthroid ?   Heart murmur re-occuring 2021   visit with Cardiologist this month   History of chicken pox    History of measles    History of syncope    Hypertension    Osteoarthritis    Palpitations    Pure hypercholesterolemia     Rosacea    Schizophrenia (HCC)    Thyroid  disease    Ulcer 2021   yes possibly but not sure    Assessment/Plan: 2 Days Post-Op Procedure(s) (LRB): REPAIR, TENDON, PATELLAR (Right) Principal Problem:   Rupture of right patellar tendon  Estimated body mass index is 27.34 kg/m as calculated from the following:   Height as of 12/31/23: 5' (1.524 m).   Weight as of 12/31/23: 63.5 kg. Advance diet Up with therapy D/C IV fluids   discharge planning: Plan to discharge to rehab based on her home situation.  She has stairs and cannot make it to the second floor.  Physical therapy and patient with therapy today.  Leave knee immobilizer locked in extension.  Toe-touch weight-bear.  Follow-up with [facility] orthopedics in 2 weeks.   DVT Prophylaxis - Lovenox Toe-touch Weight-Bearing to right leg  Krystal Doyne, PA-C Orthopaedic Surgery 01/11/2024, 7:44 AM

## 2024-01-11 NOTE — TOC CM/SW Note (Signed)
 Transition of Care (TOC) CM/SW Note   To Whom It May Concern:   Please be advised that the above-named patient will require a short-term nursing home stay - anticipated 30 days or less for rehabilitation and strengthening.  The plan is for return home

## 2024-01-11 NOTE — Telephone Encounter (Signed)
 Gluten allergy was removed from chart.

## 2024-01-11 NOTE — Plan of Care (Signed)
 Documented

## 2024-01-11 NOTE — Plan of Care (Signed)
   Problem: Safety: Goal: Ability to remain free from injury will improve Outcome: Progressing

## 2024-01-11 NOTE — Progress Notes (Signed)
 Pts BP 179/89, Pt states she take Amlodipine  5 mg daily. Dr. Edie and Krystal Doyne, PA notified,  Order received from [facility] for Amlodipine  5 mg daily. Order placed.

## 2024-01-11 NOTE — Discharge Summary (Signed)
 Physician Discharge Summary  Subjective: 3 Days Post-Op Procedure(s) (LRB): REPAIR, TENDON, PATELLAR (Right) Patient reports pain as moderate.   Patient seen in rounds with Dr. Edie. Patient is well, and has had no acute complaints or problems Patient is ready to go to rehab after physical therapy  Physician Discharge Summary  Patient ID: Diamond Lucas MRN: [id number] DOB/AGE: [birth year] 63 y.o.  Admit date: 01/09/2024 Discharge date: 01/12/2024  Admission Diagnoses:  Discharge Diagnoses:  Principal Problem:   Rupture of right patellar tendon   Discharged Condition: fair  Hospital Course: The patient is postop day 2 from a right patella tendon repair.  She is in a hinged knee immobilizer locked in extension.  She is doing well since surgery.  She did ambulate 8 feet with physical therapy.  Her vitals are stable.  Her pain is manageable.  The patient is planning on going to rehab because her home situation with stairs.  Treatments: surgery:  Primary repair of acute patellar tendon rupture, right knee.   Surgeon:   DOROTHA Reyes Edie, MD   Assistant:   None   Anesthesia:   General LMA   Findings:   As above.   Complications:   None   EBL:   5 cc   Fluids:   500 cc crystalloid   TT:   100 minutes at 300 mmHg   Drains:   None   Closure:   Staples   Implants:   [employer] 2.8 mm Q-Fix anchors x 2, Arthrex 2.6 mm Double-loaded Knotless Knee FiberTak anchors x 2, semitendinosis autograft  Discharge Exam: Blood pressure (!) 145/80, pulse 79, temperature 98.8 F (37.1 C), temperature source Temporal, resp. rate 14, last menstrual period 03/29/2015, SpO2 94%.   Disposition:    Allergies as of 01/12/2024       Reactions   Diclofenac Sodium Palpitations   Naproxen Itching, Rash, Hives   Olanzapine     Other reaction(s): Other Cross-eyed and hyperventilation   Mite (d. Farinae) Itching   Respiratory illness, and itchy eyes, eye problems Respiratory  illness, and itchy eyes, eye problems   Nsaids Other (See Comments)   Other reaction(s): Other (See Comments) Aleve causes hives 10/15 pt states she occ. Takes Motrin . Other reaction(s): Other (See Comments) Aleve causes hives   Other Itching   Respiratory illness, and itchy eyes, eye problems Respiratory illness, and itchy eyes, eye problems   Risperidone     Synthroid  [levothyroxine ]    Brand name only- causes acid reflux   Prednisolone    Eye drops        Medication List     TAKE these medications    acetaminophen  500 MG tablet Commonly known as: TYLENOL  Take 2 tablets (1,000 mg total) by mouth every 6 (six) hours as needed.   amLODipine  5 MG tablet Commonly known as: NORVASC  Take 1 tablet (5 mg total) by mouth daily.   CALCIUM  CITRATE PO Take by mouth.   Carboxymeth-Glyc-Polysorb PF 0.5-1-0.5 % Soln Apply 2 drops to eye 2 (two) times daily.   cyclobenzaprine  5 MG tablet Commonly known as: FLEXERIL  Take 1 tablet (5 mg total) by mouth daily as needed for muscle spasms.   enoxaparin 40 MG/0.4ML injection Commonly known as: LOVENOX Inject 0.4 mLs (40 mg total) into the skin daily for 14 days.   Fish Oil 1000 MG Caps Take by mouth. Takes occasionally due to causing acid reflux   MOTRIN  PO Take by mouth as needed (liquid).   Multi-Vitamins Tabs daily.  oxyCODONE 5 MG immediate release tablet Commonly known as: Oxy IR/ROXICODONE Take 1-2 tablets (5-10 mg total) by mouth every 4 (four) hours as needed for moderate pain (pain score 4-6). What changed:  how much to take when to take this reasons to take this   spironolactone 25 MG tablet Commonly known as: ALDACTONE Take 25 mg by mouth daily.   Tirosint  50 MCG Caps Generic drug: Levothyroxine  Sodium Take 75 mcg by mouth daily.   triamcinolone  55 MCG/ACT Aero nasal inhaler Commonly known as: NASACORT  Place 2 sprays into the nose as needed. Once daily as needed.   UNABLE TO FIND Take 1 capsule by  mouth 2 (two) times daily. Med Name: STEVIE [employer] Medical Equipment  (From admission, onward)           Start     Ordered   01/09/24 1254  DME Walker rolling  Once       Question Answer Comment  Walker: With 5 Inch Wheels   Patient needs a walker to treat with the following condition Rupture of right patellar tendon      01/09/24 1253   01/09/24 1254  DME 3 n 1  Once        01/09/24 1253            Follow-up Information     Kip [name] Double, PA-C Follow up in 2 week(s).   Specialty: Physician Assistant Why: For wound care and staple or suture removal. Contact information: [address] [place] [address] [phone]                 Signed: VERLINDA, Daneesha Quinteros 01/12/2024, 6:47 AM   Objective: Vital signs in last 24 hours: Temp:  [98.3 F (36.8 C)-98.8 F (37.1 C)] 98.8 F (37.1 C) (10/17 0401) Pulse Rate:  [75-85] 79 (10/17 0401) Resp:  [14-18] 14 (10/17 0401) BP: (145-179)/(77-89) 145/80 (10/17 0401) SpO2:  [94 %-97 %] 94 % (10/17 0401)  Intake/Output from previous day: No intake or output data in the 24 hours ending 01/12/24 0647  Intake/Output this shift: No intake/output data recorded.  Labs: Recent Labs    01/09/24 0909  HGB 13.3   Recent Labs    01/09/24 0909  HCT 39.0   Recent Labs    01/09/24 0909  NA 138  K 4.6  CL 102  BUN 28*  CREATININE 0.90  GLUCOSE 95   No results for input(s): LABPT, INR in the last 72 hours.  EXAM: General - Patient is Alert and Oriented Extremity - Neurovascular intact Sensation intact distally Dorsiflexion/Plantar flexion intact Incision - clean, dry, no drainage, with a hinged brace locked in extension Motor Function -plantarflexion and dorsiflexion intact.  Ambulated 8 feet.  Assessment/Plan: 3 Days Post-Op Procedure(s) (LRB): REPAIR, TENDON, PATELLAR (Right) Procedure(s) (LRB): REPAIR, TENDON, PATELLAR (Right) Past Medical History:  Diagnosis  Date   Allergy    Anxiety 2004   see a psychotherapist   Arrhythmia    Cancer [facility]) 1994   metastatic thyroid  cancer   Cervical radiculopathy 03/09/2021   Depression 2004   see a psychotherapist   GERD (gastroesophageal reflux disease) 2021   from pregnancy or Synthroid ?   Heart murmur re-occuring 2021   visit with Cardiologist this month   History of chicken pox    History of measles    History of syncope    Hypertension    Osteoarthritis  Palpitations    Pure hypercholesterolemia    Rosacea    Schizophrenia (HCC)    Thyroid  disease    Ulcer 2021   yes possibly but not sure   Principal Problem:   Rupture of right patellar tendon  Estimated body mass index is 27.34 kg/m as calculated from the following:   Height as of 12/31/23: 5' (1.524 m).   Weight as of 12/31/23: 63.5 kg. Advance diet Up with therapy D/C IV fluids Diet - Regular diet Follow up - in 2 weeks Activity -toe-touch weightbearing on the right leg with the brace locked in extension Disposition - Rehab Condition Upon Discharge - Stable DVT Prophylaxis - Lovenox  Krystal Doyne, PA-C Orthopaedic Surgery 01/12/2024, 6:47 AM

## 2024-01-11 NOTE — Progress Notes (Signed)
   01/11/24 0910  Spiritual Encounters  Type of Visit Initial  Care provided to: Patient  Conversation partners present during encounter Other (comment);Nurse (Physical Therapy)  Referral source Chaplain team;Patient request  Reason for visit Religious ritual  OnCall Visit No  Spiritual Framework  Presenting Themes Values and beliefs  Values/beliefs [facility] and [facility]  Spiritual Care Plan  Spiritual Care Issues Still Outstanding Chaplain will continue to follow  Recommendations for Clinical Staff Follow up with physician re: continued pain   Followed up on patient's request for prayer.  Would like continued follow up from Spiritual Care. Nurse aware of patient's continued leg pain.

## 2024-01-12 DIAGNOSIS — F324 Major depressive disorder, single episode, in partial remission: Secondary | ICD-10-CM | POA: Diagnosis not present

## 2024-01-12 DIAGNOSIS — E039 Hypothyroidism, unspecified: Secondary | ICD-10-CM | POA: Diagnosis not present

## 2024-01-12 DIAGNOSIS — F209 Schizophrenia, unspecified: Secondary | ICD-10-CM | POA: Diagnosis not present

## 2024-01-12 DIAGNOSIS — F411 Generalized anxiety disorder: Secondary | ICD-10-CM | POA: Diagnosis not present

## 2024-01-12 DIAGNOSIS — I10 Essential (primary) hypertension: Secondary | ICD-10-CM | POA: Diagnosis not present

## 2024-01-12 DIAGNOSIS — Z79899 Other long term (current) drug therapy: Secondary | ICD-10-CM | POA: Diagnosis not present

## 2024-01-12 DIAGNOSIS — M6281 Muscle weakness (generalized): Secondary | ICD-10-CM | POA: Diagnosis not present

## 2024-01-12 DIAGNOSIS — S86811A Strain of other muscle(s) and tendon(s) at lower leg level, right leg, initial encounter: Secondary | ICD-10-CM | POA: Diagnosis not present

## 2024-01-12 DIAGNOSIS — R262 Difficulty in walking, not elsewhere classified: Secondary | ICD-10-CM | POA: Diagnosis not present

## 2024-01-12 MED ORDER — AMLODIPINE BESYLATE 5 MG PO TABS: 5.0000 mg | ORAL_TABLET | Freq: Every day | ORAL | 0 refills | Status: DC

## 2024-01-12 NOTE — Care Management Obs Status (Signed)
 MEDICARE OBSERVATION STATUS NOTIFICATION   Patient Details  Name: Diamond Lucas MRN: [id number] Date of Birth: 12-17-1960   Medicare Observation Status Notification Given:  Yes    [name] 01/12/2024, 11:46 AM

## 2024-01-12 NOTE — Plan of Care (Signed)
 See chart

## 2024-01-12 NOTE — Progress Notes (Signed)
 Inpatient Rehab Admissions Coordinator:   Per [facility] request pt was screened for CIR by Reche Lowers, PT, DPT.  Chart reviewed.  Pt admitted for fall at home with knee pain and swelling.  She initially presented to ED for on 10/5 where a patellar tendon rupture was discovered.  She was placed in a KI and discharged to f/u with ortho.  During that f/u she was scheduled for urgent MRI and referred to Dr. Edie who recommended operative repair.  She underwent repair of the right patellar tendon rupture on 10/14 per Dr. Edie.  Post op course uncomplicated. She is mobilizing up to 42' with a RW and minimal assist while maintaining NWB on her RLE.  She does not have an option for 24/7 supervision at home.  At this time she does not demonstrate the need for daily physician oversight, which is a requirement for CIR.  Recommend SNF if pt feels she cannot discharge home safely.    Reche Lowers, PT, DPT Admissions Coordinator [phone] 01/12/24  12:33 PM

## 2024-01-12 NOTE — Progress Notes (Addendum)
 Patient is insisting that and states that she is pregnant with 3 babies in her belly). When giving the patient Lovenox for blood clot prevention this morning, she said watch my babies. She has been stating this all week with inpatient nurses.

## 2024-01-12 NOTE — TOC Progression Note (Addendum)
 Transition of Care [facility]) - Initial/Assessment Note    Patient Details  Name: Diamond Lucas MRN: [id number] Date of Birth: June 01, 1960  Transition of [facility]) CM/SW Contact:    [place] L Airica Schwartzkopf, LCSW Phone Number: 01/12/2024, 3:25 PM  Clinical Narrative:                  CSW met with patient to review bed offers and recommendations. Per PT, patient is able to ambulate to a RW. She is able to walk at least 40 feet. Patient advised of ongoing concerns as CSW attempted to advise her that insurance is not likely to pay for her stay at rehab. She advised that she resides in a two level apartment and wouldn't be able to get up the stairs. She reported that she does not have a bathroom on the lower level.   Patient provided ongoing reasoning as to why she couldn't discharge home.   CSW spoke with RN who advised of some mental health concerns. She advised that patient had been advising that she was pregnant with triplets. CSW did not review this information in the chart as it was not documented.   Patient is an observation patient and was here for surgery. She did not meet the criteria for inpatient rehab. Per Utilization review, patient did not meet criteria for inpatient stay.   Patient does not meet the medical and skilled need criteria for rehab.         Patient Goals and CMS Choice            Expected Discharge Plan and Services         Expected Discharge Date: 01/12/24                                    Prior Living Arrangements/Services                       Activities of Daily Living   ADL Screening (condition at time of admission) Independently performs ADLs?: Yes (appropriate for developmental age) Is the patient deaf or have difficulty hearing?: No Does the patient have difficulty seeing, even when wearing glasses/contacts?: No Does the patient have difficulty concentrating, remembering, or making decisions?: No  Permission  Sought/Granted                  Emotional Assessment              Admission diagnosis:  Rupture, tendon, patellar, right, initial encounter [D13.188J] Rupture of right patellar tendon [D13.188J] Patient Active Problem List   Diagnosis Date Noted   Rupture of right patellar tendon 01/09/2024   Lice 12/19/2023   Rectal polyp 07/27/2023   History of colon polyps 03/08/2023   Breast asymmetry 03/08/2023   Cervical high risk human papillomavirus (HPV) DNA test positive 06/13/2022   Thyroid  neoplasm malignant (HCC) 07/05/2021   Paranoid schizophrenia (HCC) 04/28/2021   Varicose veins with pain 04/23/2018   Polyp of sigmoid colon    Benign neoplasm of cecum    Chronic venous insufficiency 10/07/2017   Delusion of infestation (HCC) 09/15/2016   Postoperative hypothyroidism 05/17/2016   Bipolar I disorder, most recent episode manic, severe with psychotic features (HCC) 02/17/2016   Delusional disorder, 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 - [phone]  [address] AT [facility] OF [address] [place] [address] Phone: [phone] Fax: [phone]  [id number] - [place], [place] - [address] [place] [address] Phone: [phone] Fax: [phone]     Social Drivers of Health (SDOH) Social History: SDOH Screenings   Food Insecurity: Food Insecurity Present (01/09/2024)  Housing: Low Risk  (01/09/2024)  Transportation Needs: No Transportation Needs (01/09/2024)  Utilities: Not At Risk (01/09/2024)  Alcohol Screen: Low Risk  (09/06/2023)  Depression (PHQ2-9): Low Risk  (12/19/2023)  Financial Resource Strain: High Risk (11/30/2023)  Physical Activity: Inactive (11/30/2023)  Social Connections: Moderately Integrated (11/30/2023)  Recent Concern: Social Connections - Moderately Isolated (09/06/2023)  Stress:  No Stress Concern Present (11/30/2023)  Tobacco Use: Low Risk  (01/09/2024)  Health Literacy: Adequate Health Literacy (09/06/2023)   SDOH Interventions:     Readmission Risk Interventions     No data to display

## 2024-01-12 NOTE — Progress Notes (Signed)
 Physical Therapy Treatment Patient Details Name: Diamond Lucas MRN: [id number] DOB: [birth year] Today's Date: 01/12/2024   History of Present Illness 63 y.o. female who presents for evaluation and treatment of her right knee pain and swelling following an injury which occurred on 12/31/2023.  Apparently she slipped on some grease and fell onto her knee while in her kitchen cooking.  She presented to the emergency room where examination was concerning for a patella tendon rupture.  Ultimately needing surgery having repair 11/ 14.    PT Comments  Pt was long sitting in bed upon arrival. Endorses 5/10 pain at rest that elevated to 10/10 pain with activity. Pain did not limit session progression. Pt seems more anxious than pain limited. She was able to tolerate getting OOB, standing to RW, and ambulation ~ 40 ft. Continues to be unwilling/unable to perform TTWB but does maintain NWB. Pt was encouraged to increase exercises and OOB throughout the day. Pt is progressing but remains unsafe to Dc directly home due to living on 2nd story. Acute PT will continue to follow per current POC.     If plan is discharge home, recommend the following: A little help with walking and/or transfers;A little help with bathing/dressing/bathroom;Assistance with cooking/housework;Assist for transportation;Help with stairs or ramp for entrance   Can travel by private vehicle     No  Equipment Recommendations   (TBD at rehab)       Precautions / Restrictions Precautions Precautions: Fall;Knee Recall of Precautions/Restrictions: Intact Required Braces or Orthoses: Knee Immobilizer - Right Knee Immobilizer - Right:  (locked in extension) Restrictions Weight Bearing Restrictions Per Provider Order: Yes RLE Weight Bearing Per Provider Order: Touchdown weight bearing     Mobility  Bed Mobility Overal bed mobility: Needs Assistance Bed Mobility: Supine to Sit  Supine to sit: HOB elevated, Used rails, Mod  assist  General bed mobility comments: increased time + use of bed rails and HOB elevated. less assistance than previous date.    Transfers Overall transfer level: Needs assistance Equipment used: Rolling walker (2 wheels) Transfers: Sit to/from Stand Sit to Stand: Min assist, Contact guard assist  General transfer comment: pt stood with less assistance than previous date. Performs NWB even when cued for TTWB    Ambulation/Gait Ambulation/Gait assistance: Min assist, Contact guard assist Gait Distance (Feet): 40 Feet Assistive device: Rolling walker (2 wheels) Gait Pattern/deviations: Step-to pattern Gait velocity: decreased  General Gait Details: pt continues to self selects fully NWBing on the R. tolerated increased distance but still endorse 10/10 pain.   Balance Overall balance assessment: Needs assistance Sitting-balance support: No upper extremity supported Sitting balance-Leahy Scale: Good     Standing balance support: Bilateral upper extremity supported Standing balance-Leahy Scale: Good Standing balance comment: able to maintain NWBing on R, mild unsteadiness in standing with anxious effort       Communication Communication Communication: No apparent difficulties  Cognition Arousal: Alert Behavior During Therapy: Anxious, WFL for tasks assessed/performed   PT - Cognitive impairments: No apparent impairments   PT - Cognition Comments: Pt is A and O but continues to present with some anxiety. Unable to perform TTWB but does maintain NWB Following commands: Intact      Cueing Cueing Techniques: Verbal cues, Visual cues, Gestural cues  Exercises General Exercises - Lower Extremity Straight Leg Raises:  (unable to  AROM SLR)    General Comments General comments (skin integrity, edema, etc.): breakfats tray arrived during session      Pertinent Vitals/Pain Pain  Assessment Pain Assessment: 0-10 Pain Score: 10-Worst pain ever Pain Location: o/10 pain at rest  10/10 pain with movements. pain did not limit session progression.     PT Goals (current goals can now be found in the care plan section) Acute Rehab PT Goals Patient Stated Goal: be able to sfaely do stairs at rehab so I can go home Progress towards PT goals: Progressing toward goals    Frequency    7X/week       AM-PAC PT 6 Clicks Mobility   Outcome Measure  Help needed turning from your back to your side while in a flat bed without using bedrails?: A Little Help needed moving from lying on your back to sitting on the side of a flat bed without using bedrails?: A Lot Help needed moving to and from a bed to a chair (including a wheelchair)?: A Lot Help needed standing up from a chair using your arms (e.g., wheelchair or bedside chair)?: A Lot Help needed to walk in hospital room?: A Lot Help needed climbing 3-5 steps with a railing? : Total 6 Click Score: 12    End of Session Equipment Utilized During Treatment: Gait belt;Right knee immobilizer Activity Tolerance: Patient tolerated treatment well;Patient limited by fatigue Patient left: with bed alarm set;with call bell/phone within reach Nurse Communication: Mobility status PT Visit Diagnosis: Unsteadiness on feet (R26.81);Muscle weakness (generalized) (M62.81);Difficulty in walking, not elsewhere classified (R26.2);Pain Pain - Right/Left: Right Pain - part of body: Knee     Time: 9163-9147 PT Time Calculation (min) (ACUTE ONLY): 16 min  Charges:    $Gait Training: 8-22 mins PT General Charges $$ ACUTE PT VISIT: 1 Visit                     Rankin Essex PTA 01/12/24, 9:34 AM

## 2024-01-12 NOTE — Progress Notes (Signed)
  Subjective: 3 Days Post-Op Procedure(s) (LRB): REPAIR, TENDON, PATELLAR (Right) Patient reports pain as mild.   Patient is well, and has had no acute complaints or problems Plan is to go Rehab after hospital stay. Negative for chest pain and shortness of breath Fever: no Gastrointestinal: Negative for nausea and vomiting  Objective: Vital signs in last 24 hours: Temp:  [98.3 F (36.8 C)-98.8 F (37.1 C)] 98.8 F (37.1 C) (10/17 0401) Pulse Rate:  [75-85] 79 (10/17 0401) Resp:  [14-18] 14 (10/17 0401) BP: (145-179)/(77-89) 145/80 (10/17 0401) SpO2:  [94 %-97 %] 94 % (10/17 0401)  Intake/Output from previous day: No intake or output data in the 24 hours ending 01/12/24 0645   Intake/Output this shift: No intake/output data recorded.  Labs: Recent Labs    01/09/24 0909  HGB 13.3   Recent Labs    01/09/24 0909  HCT 39.0   Recent Labs    01/09/24 0909  NA 138  K 4.6  CL 102  BUN 28*  CREATININE 0.90  GLUCOSE 95   No results for input(s): LABPT, INR in the last 72 hours.   EXAM General - Patient is Alert and Oriented Extremity - Neurovascular intact Sensation intact distally Dorsiflexion/Plantar flexion intact Dressing/Incision - clean, dry, no drainage with the knee immobilizer in place and locked in extension. Motor Function - intact, moving foot and toes well on exam.  She ambulated 8 feet with physical therapy  Past Medical History:  Diagnosis Date   Allergy    Anxiety 2004   see a psychotherapist   Arrhythmia    Cancer (HCC) 1994   metastatic thyroid  cancer   Cervical radiculopathy 03/09/2021   Depression 2004   see a psychotherapist   GERD (gastroesophageal reflux disease) 2021   from pregnancy or Synthroid ?   Heart murmur re-occuring 2021   visit with Cardiologist this month   History of chicken pox    History of measles    History of syncope    Hypertension    Osteoarthritis    Palpitations    Pure hypercholesterolemia     Rosacea    Schizophrenia (HCC)    Thyroid  disease    Ulcer 2021   yes possibly but not sure    Assessment/Plan: 3 Days Post-Op Procedure(s) (LRB): REPAIR, TENDON, PATELLAR (Right) Principal Problem:   Rupture of right patellar tendon  Estimated body mass index is 27.34 kg/m as calculated from the following:   Height as of 12/31/23: 5' (1.524 m).   Weight as of 12/31/23: 63.5 kg. Advance diet Up with therapy D/C IV fluids   discharge planning: Plan to discharge to rehab based on her home situation.  She has stairs and cannot make it to the second floor.  Physical therapy and patient with therapy today.  Leave knee immobilizer locked in extension.  Toe-touch weight-bear.  Follow-up with [facility] orth[facility]ics in 2 weeks.   DVT Prophylaxis - Lovenox Toe-touch Weight-Bearing to right leg  Krystal Doyne, PA-C Orthopaedic Surgery 01/12/2024, 6:45 AM

## 2024-01-12 NOTE — Progress Notes (Signed)
  Subjective: 4 Days Post-Op Procedure(s) (LRB): REPAIR, TENDON, PATELLAR (Right) Patient reports pain as mild.   Patient is well, and has had no acute complaints or problems Discharge planning pending psychiatric evaluation Negative for chest pain and shortness of breath Fever: no Gastrointestinal: Negative for nausea and vomiting. Still working on BM  Objective: Vital signs in last 24 hours: Temp:  [97.9 F (36.6 C)-99.2 F (37.3 C)] 98.1 F (36.7 C) (10/18 0742) Pulse Rate:  [88-97] 88 (10/18 0742) Resp:  [16-18] 16 (10/18 0742) BP: (137-145)/(75-91) 137/76 (10/18 0742) SpO2:  [96 %-99 %] 96 % (10/18 0742)  Intake/Output from previous day: No intake or output data in the 24 hours ending 01/13/24 0916   Intake/Output this shift: No intake/output data recorded.  Labs: No results for input(s): HGB in the last 72 hours.  No results for input(s): WBC, RBC, HCT, PLT in the last 72 hours.  No results for input(s): NA, K, CL, CO2, BUN, CREATININE, GLUCOSE, CALCIUM  in the last 72 hours.  No results for input(s): LABPT, INR in the last 72 hours.   EXAM General - Patient is Alert and Oriented Extremity - Neurovascular intact Sensation intact distally Dorsiflexion/Plantar flexion intact Dressing/Incision - Mild drainage, new Aquacell bandage applied. Knee immobilizer in place and locked in extension. Motor Function - intact, moving foot and toes well on exam.  She ambulated 40 feet with physical therapy  Past Medical History:  Diagnosis Date   Allergy    Anxiety 2004   see a psychotherapist   Arrhythmia    Cancer [facility]) 1994   metastatic thyroid  cancer   Cervical radiculopathy 03/09/2021   Depression 2004   see a psychotherapist   GERD (gastroesophageal reflux disease) 2021   from pregnancy or Synthroid ?   Heart murmur re-occuring 2021   visit with Cardiologist this month   History of chicken pox    History of measles    History of  syncope    Hypertension    Osteoarthritis    Palpitations    Pure hypercholesterolemia    Rosacea    Schizophrenia (HCC)    Thyroid  disease    Ulcer 2021   yes possibly but not sure    Assessment/Plan: 4 Days Post-Op Procedure(s) (LRB): REPAIR, TENDON, PATELLAR (Right) Principal Problem:   Rupture of right patellar tendon  Estimated body mass index is 27.34 kg/m as calculated from the following:   Height as of 12/31/23: 5' (1.524 m).   Weight as of 12/31/23: 63.5 kg. Advance diet Up with therapy D/C IV fluids   VSS Continue to work with PT and OT. Toe-touch Weight-Bearing to right leg in knee immobilizer  Continue to monitor for BM, patient on Colace, milk of mag and has had one fleet enema  Psych consulted for evaluation  Discharge planning: pending psychiatric evaluation DVT Prophylaxis - Lovenox Follow up with [facility] in 10-14 days for re-evaluation  Fonda CHARLENA Koyanagi, PA-C Orthopaedic Surgery 01/13/2024, 9:16 AM

## 2024-01-13 DIAGNOSIS — F209 Schizophrenia, unspecified: Secondary | ICD-10-CM

## 2024-01-13 DIAGNOSIS — R262 Difficulty in walking, not elsewhere classified: Secondary | ICD-10-CM | POA: Diagnosis not present

## 2024-01-13 DIAGNOSIS — S86801A Unspecified injury of other muscle(s) and tendon(s) at lower leg level, right leg, initial encounter: Secondary | ICD-10-CM | POA: Diagnosis not present

## 2024-01-13 DIAGNOSIS — I10 Essential (primary) hypertension: Secondary | ICD-10-CM | POA: Diagnosis not present

## 2024-01-13 DIAGNOSIS — Z79899 Other long term (current) drug therapy: Secondary | ICD-10-CM | POA: Diagnosis not present

## 2024-01-13 DIAGNOSIS — M6281 Muscle weakness (generalized): Secondary | ICD-10-CM | POA: Diagnosis not present

## 2024-01-13 DIAGNOSIS — S86811A Strain of other muscle(s) and tendon(s) at lower leg level, right leg, initial encounter: Secondary | ICD-10-CM | POA: Diagnosis not present

## 2024-01-13 DIAGNOSIS — E039 Hypothyroidism, unspecified: Secondary | ICD-10-CM | POA: Diagnosis not present

## 2024-01-13 NOTE — Progress Notes (Signed)
 Physical Therapy Treatment Patient Details Name: Diamond Lucas MRN: [id number] DOB: 1960/05/16 Today's Date: 01/13/2024   History of Present Illness 63 y.o. female who presents for evaluation and treatment of her right knee pain and swelling following an injury which occurred on 12/31/2023.  Apparently she slipped on some grease and fell onto her knee while in her kitchen cooking.  She presented to the emergency room where examination was concerning for a patella tendon rupture.  Ultimately needing surgery having repair 11/ 14.    PT Comments  Pt received in bed for PT session. Continues to require assist for supine<>sit with heavy use of bed rails and inability to lift R LE to clear bed. Pt educated on using gait belt to self assist and declined due to increased pain. Short distance gait tolerated in hall with heavy reliance on RW to off load R LE for wt bearing compliance. Pt returned to bed, R hinge brace adjusted to proper position. Discussed pt's home environment and continue to recommend STR once medically cleared. Pt remains limited by wt bearing restrictions, pain, inability to progress to stair training, lives alone without nearby assist, and is unable to safely mobilize or perform ADL's independently.   If plan is discharge home, recommend the following: A little help with walking and/or transfers;A little help with bathing/dressing/bathroom;Assistance with cooking/housework;Assist for transportation;Help with stairs or ramp for entrance   Can travel by private vehicle     No  Equipment Recommendations  None recommended by PT (TBD at next level of care)    Recommendations for Other Services       Precautions / Restrictions Precautions Precautions: Fall;Knee Precaution Booklet Issued: No Recall of Precautions/Restrictions: Intact Precaution/Restrictions Comments:  (RLE hinge brace locked in extension) Required Braces or Orthoses: Knee Immobilizer - Right Knee Immobilizer  - Right: On at all times Restrictions Weight Bearing Restrictions Per Provider Order: Yes RLE Weight Bearing Per Provider Order: Touchdown weight bearing     Mobility  Bed Mobility Overal bed mobility: Needs Assistance Bed Mobility: Supine to Sit, Sit to Supine     Supine to sit: HOB elevated, Used rails, Min assist Sit to supine: Min assist, HOB elevated, Used rails   General bed mobility comments: increased time + use of bed rails and HOB elevated. less assistance than previous date.    Transfers Overall transfer level: Needs assistance Equipment used: Rolling walker (2 wheels) Transfers: Sit to/from Stand Sit to Stand: Min assist, Contact guard assist           General transfer comment: pt stood with less assistance than previous date. Performs NWB even when cued for TTWB    Ambulation/Gait Ambulation/Gait assistance: Min assist, Contact guard assist Gait Distance (Feet): 45 Feet Assistive device: Rolling walker (2 wheels) Gait Pattern/deviations: Step-to pattern Gait velocity: decreased     General Gait Details: pt continues to self select full NWBing on the R LE tolerated increased distance but still endorse significant pain.   Stairs Stairs: Yes       General stair comments:  (Pt lives on second floor apatment, unable to attempt at this time)   Wheelchair Mobility     Tilt Bed    Modified Rankin (Stroke Patients Only)       Balance Overall balance assessment: Needs assistance Sitting-balance support: No upper extremity supported Sitting balance-Leahy Scale: Good     Standing balance support: Bilateral upper extremity supported Standing balance-Leahy Scale: Good Standing balance comment: able to maintain NWBing on R, mild  unsteadiness in standing with anxious effort                            Communication Communication Communication: No apparent difficulties  Cognition Arousal: Alert Behavior During Therapy: Anxious, WFL for  tasks assessed/performed   PT - Cognitive impairments: No apparent impairments                       PT - Cognition Comments: Pt is A and O but continues to present with some anxiety. Unable to perform TTWB but does maintain NWB Following commands: Intact      Cueing Cueing Techniques: Verbal cues, Visual cues, Gestural cues  Exercises General Exercises - Lower Extremity Ankle Circles/Pumps: AROM, 10 reps Quad Sets: Limitations Quad Sets Limitations:  (Unable to tolerate due to pain) Straight Leg Raises: Limitations Straight Leg Raises Limitations:  (Unable to tolerate due to pain)    General Comments General comments (skin integrity, edema, etc.):  (Pt unable to actively assist with R LE mobility for in/out of bed. Educated on using gait belt strap hooked onto brace, pt states it causes too much knee pain)      Pertinent Vitals/Pain Pain Assessment Pain Assessment: 0-10 Pain Score: 6  Pain Location: o/10 pain at rest 10/10 pain with movements. pain did not limit session progression. Pain Descriptors / Indicators: Aching, Discomfort Pain Intervention(s): Monitored during session    Home Living                          Prior Function            PT Goals (current goals can now be found in the care plan section) Acute Rehab PT Goals Patient Stated Goal: be able to sfaely do stairs at rehab so I can go home    Frequency    7X/week      PT Plan      Co-evaluation              AM-PAC PT 6 Clicks Mobility   Outcome Measure  Help needed turning from your back to your side while in a flat bed without using bedrails?: A Little Help needed moving from lying on your back to sitting on the side of a flat bed without using bedrails?: A Lot Help needed moving to and from a bed to a chair (including a wheelchair)?: A Lot Help needed standing up from a chair using your arms (e.g., wheelchair or bedside chair)?: A Lot Help needed to walk in  hospital room?: A Lot Help needed climbing 3-5 steps with a railing? : Total 6 Click Score: 12    End of Session Equipment Utilized During Treatment: Gait belt;Right knee immobilizer Activity Tolerance: Patient tolerated treatment well;Patient limited by pain Patient left: with bed alarm set;with call bell/phone within reach Nurse Communication: Mobility status PT Visit Diagnosis: Unsteadiness on feet (R26.81);Muscle weakness (generalized) (M62.81);Difficulty in walking, not elsewhere classified (R26.2);Pain Pain - Right/Left: Right Pain - part of body: Knee     Time: 1431-1450 PT Time Calculation (min) (ACUTE ONLY): 19 min  Charges:    $Therapeutic Activity: 8-22 mins PT General Charges $$ ACUTE PT VISIT: 1 Visit                    Darice Bohr, PTA  Darice JAYSON Bohr 01/13/2024, 3:13 PM
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
## 2024-01-13 NOTE — Plan of Care (Signed)

## 2024-01-13 NOTE — Plan of Care (Signed)

## 2024-01-14 DIAGNOSIS — Z79899 Other long term (current) drug therapy: Secondary | ICD-10-CM | POA: Diagnosis not present

## 2024-01-14 DIAGNOSIS — I10 Essential (primary) hypertension: Secondary | ICD-10-CM | POA: Diagnosis not present

## 2024-01-14 DIAGNOSIS — S86811A Strain of other muscle(s) and tendon(s) at lower leg level, right leg, initial encounter: Secondary | ICD-10-CM | POA: Diagnosis not present

## 2024-01-14 DIAGNOSIS — R262 Difficulty in walking, not elsewhere classified: Secondary | ICD-10-CM | POA: Diagnosis not present

## 2024-01-14 DIAGNOSIS — E039 Hypothyroidism, unspecified: Secondary | ICD-10-CM | POA: Diagnosis not present

## 2024-01-14 DIAGNOSIS — M6281 Muscle weakness (generalized): Secondary | ICD-10-CM | POA: Diagnosis not present

## 2024-01-14 MED ORDER — ACETAMINOPHEN 500 MG PO TABS
1000.0000 mg | ORAL_TABLET | Freq: Three times a day (TID) | ORAL | Status: DC | PRN
  Administered 2024-01-14 – 2024-01-16 (×7): 1000 mg via ORAL
  Filled 2024-01-14 (×7): qty 2

## 2024-01-14 NOTE — Progress Notes (Addendum)
 Physical Therapy Treatment Patient Details Name: Diamond Lucas MRN: [id number] DOB: 1961-01-28 Today's Date: 01/14/2024   History of Present Illness 63 y.o. female who presents for evaluation and treatment of her right knee pain and swelling following an injury which occurred on 12/31/2023.  Apparently she slipped on some grease and fell onto her knee while in her kitchen cooking.  She presented to the emergency room where examination was concerning for a patella tendon rupture.  Ultimately needing surgery having repair 11/ 14.    PT Comments  Pt ready for session.  Premedicated.  She is able to get to EOB with rails and HOB raised self assisting LE today. Steady in sitting.  Stands to RW with cga/min a x 1 due to some imbalances but is able to progress gait 45' of self selected distance.  Does well with tt/NWB with some minimal cues for walker placement.  She is able to return to bed with min a x 1 for LE support and repositions herself with trapeze.  She did not feel as she could trial stairs today. Opts to return to bed stating chair does not support her leg enough despite LE's raised  Generally odd interactions at times during session.  She does state therapist yesterday said she needed an MRI and would order it  Explained to pt that it would be unusual and unlikely that a therapist would have recommended an MRI and that was something that we were unable to order from a therapy persepctive.  Encouraged her to talk with her doctor if she had concerns about imaging       If plan is discharge home, recommend the following: A little help with walking and/or transfers;A little help with bathing/dressing/bathroom;Assistance with cooking/housework;Assist for transportation;Help with stairs or ramp for entrance   Can travel by private vehicle     No  Equipment Recommendations  None recommended by PT (TBD at next level of care)    Recommendations for Other Services       Precautions /  Restrictions Precautions Precautions: Fall;Knee Precaution Booklet Issued: No Recall of Precautions/Restrictions: Intact Precaution/Restrictions Comments:  (RLE hinge brace locked in extension) Required Braces or Orthoses: Knee Immobilizer - Right Knee Immobilizer - Right: On at all times Restrictions Weight Bearing Restrictions Per Provider Order: Yes RLE Weight Bearing Per Provider Order: Touchdown weight bearing     Mobility  Bed Mobility Overal bed mobility: Needs Assistance Bed Mobility: Supine to Sit, Sit to Supine     Supine to sit: Used rails, HOB elevated, Supervision Sit to supine: Min assist, HOB elevated, Used rails     Patient Response: Anxious  Transfers Overall transfer level: Needs assistance Equipment used: Rolling walker (2 wheels) Transfers: Sit to/from Stand Sit to Stand: Min assist, Contact guard assist                Ambulation/Gait Ambulation/Gait assistance: Min assist, Contact guard assist Gait Distance (Feet): 45 Feet Assistive device: Rolling walker (2 wheels) Gait Pattern/deviations: Step-to pattern Gait velocity: decreased     General Gait Details: able to maintain TT/NWB today during session   Stairs             Wheelchair Mobility     Tilt Bed Tilt Bed Patient Response: Anxious  Modified Rankin (Stroke Patients Only)       Balance Overall balance assessment: Needs assistance Sitting-balance support: No upper extremity supported Sitting balance-Leahy Scale: Good     Standing balance support: Bilateral upper extremity supported Standing  balance-Leahy Scale: Good Standing balance comment: able to maintain NWBing on R, mild unsteadiness in standing with anxious effort                            Communication Communication Communication: No apparent difficulties  Cognition Arousal: Alert Behavior During Therapy: Anxious, WFL for tasks assessed/performed   PT - Cognitive impairments: No apparent  impairments                       PT - Cognition Comments: anxiety and generally odd interactions during session Following commands: Intact      Cueing Cueing Techniques: Verbal cues, Visual cues, Gestural cues  Exercises Other Exercises Other Exercises: reveiwed HEP which she is able to recall and stated she is doing.  she is assisted with SLR and ABD.  KI donned throughout and remianed on after    General Comments        Pertinent Vitals/Pain Pain Assessment Pain Assessment: Faces Faces Pain Scale: Hurts even more Pain Descriptors / Indicators: Aching, Discomfort Pain Intervention(s): Limited activity within patient's tolerance, Monitored during session, Repositioned, Premedicated before session    Home Living                          Prior Function            PT Goals (current goals can now be found in the care plan section) Progress towards PT goals: Progressing toward goals    Frequency    7X/week      PT Plan      Co-evaluation              AM-PAC PT 6 Clicks Mobility   Outcome Measure  Help needed turning from your back to your side while in a flat bed without using bedrails?: None Help needed moving from lying on your back to sitting on the side of a flat bed without using bedrails?: A Little Help needed moving to and from a bed to a chair (including a wheelchair)?: A Little Help needed standing up from a chair using your arms (e.g., wheelchair or bedside chair)?: A Little Help needed to walk in hospital room?: A Little Help needed climbing 3-5 steps with a railing? : Total 6 Click Score: 17    End of Session Equipment Utilized During Treatment: Gait belt;Right knee immobilizer Activity Tolerance: Patient tolerated treatment well;Patient limited by pain Patient left: with bed alarm set;with call bell/phone within reach Nurse Communication: Mobility status PT Visit Diagnosis: Unsteadiness on feet (R26.81);Muscle weakness  (generalized) (M62.81);Difficulty in walking, not elsewhere classified (R26.2);Pain Pain - Right/Left: Right Pain - part of body: Knee     Time: 9164-9147 PT Time Calculation (min) (ACUTE ONLY): 17 min  Charges:    $Gait Training: 8-22 mins PT General Charges $$ ACUTE PT VISIT: 1 Visit                   Lauraine Gills, PTA 01/14/24, 9:10 AM

## 2024-01-14 NOTE — Progress Notes (Addendum)
  Subjective: 5 Days Post-Op Procedure(s) (LRB): REPAIR, TENDON, PATELLAR (Right) Patient reports pain as mild.   Patient is well, and has had no acute complaints or problems Discharge planning pending psychiatric evaluation Negative for chest pain and shortness of breath Fever: no Gastrointestinal: Negative for nausea and vomiting. Still working on BM  Objective: Vital signs in last 24 hours: Temp:  [98.1 F (36.7 C)-99.8 F (37.7 C)] 98.1 F (36.7 C) (10/19 0830) Pulse Rate:  [80-95] 88 (10/19 0830) Resp:  [16-20] 17 (10/19 0830) BP: (124-143)/(62-78) 143/78 (10/19 0830) SpO2:  [94 %-100 %] 94 % (10/19 0830) Weight:  [63.5 kg] 63.5 kg (10/18 1930)  Intake/Output from previous day:  Intake/Output Summary (Last 24 hours) at 01/14/2024 1201 Last data filed at 01/14/2024 0900 Gross per 24 hour  Intake 480 ml  Output --  Net 480 ml     Intake/Output this shift: Total I/O In: 240 [P.O.:240] Out: -   Labs: No results for input(s): HGB in the last 72 hours.  No results for input(s): WBC, RBC, HCT, PLT in the last 72 hours.  No results for input(s): NA, K, CL, CO2, BUN, CREATININE, GLUCOSE, CALCIUM  in the last 72 hours.  No results for input(s): LABPT, INR in the last 72 hours.   EXAM General - Patient is Alert and Oriented Extremity - Neurovascular intact Sensation intact distally Dorsiflexion/Plantar flexion intact Dressing/Incision - Minimal drainage, Aquacell bandage intact. Knee immobilizer in place and locked in extension. Motor Function - intact, moving foot and toes well on exam.  She ambulated 40 feet with physical therapy  Past Medical History:  Diagnosis Date   Allergy    Anxiety 2004   see a psychotherapist   Arrhythmia    Cancer [facility]) 1994   metastatic thyroid  cancer   Cervical radiculopathy 03/09/2021   Depression 2004   see a psychotherapist   GERD (gastroesophageal reflux disease) 2021   from pregnancy or  Synthroid ?   Heart murmur re-occuring 2021   visit with Cardiologist this month   History of chicken pox    History of measles    History of syncope    Hypertension    Osteoarthritis    Palpitations    Pure hypercholesterolemia    Rosacea    Schizophrenia (HCC)    Thyroid  disease    Ulcer 2021   yes possibly but not sure    Assessment/Plan: 5 Days Post-Op Procedure(s) (LRB): REPAIR, TENDON, PATELLAR (Right) Principal Problem:   Rupture of right patellar tendon  Estimated body mass index is 27.34 kg/m as calculated from the following:   Height as of this encounter: 5' (1.524 m).   Weight as of this encounter: 63.5 kg. Advance diet Up with therapy D/C IV fluids   VSS Continue to work with PT and OT. Toe-touch Weight-Bearing to right leg in knee immobilizer BM+  Psych consulted for evaluation that put patient at a low risk for self harm at this juncture and state that there are no barriers to discharge at this point.  Message sent to nursing and [facility] as patient is agreeable to go home with HHPT/OT services.   Discharge planning: deemed safe for DC from psych evaluation - patient amenable to home health services DVT Prophylaxis - Lovenox Follow up with [facility] in 10-14 days for re-evaluation  Fonda CHARLENA Koyanagi, PA-C Orthopaedic Surgery 01/14/2024, 12:01 PM

## 2024-01-15 ENCOUNTER — Encounter: Payer: Self-pay | Admitting: Family Medicine

## 2024-01-15 DIAGNOSIS — R262 Difficulty in walking, not elsewhere classified: Secondary | ICD-10-CM | POA: Diagnosis not present

## 2024-01-15 DIAGNOSIS — I10 Essential (primary) hypertension: Secondary | ICD-10-CM | POA: Diagnosis not present

## 2024-01-15 DIAGNOSIS — S86811A Strain of other muscle(s) and tendon(s) at lower leg level, right leg, initial encounter: Secondary | ICD-10-CM | POA: Diagnosis not present

## 2024-01-15 DIAGNOSIS — Z79899 Other long term (current) drug therapy: Secondary | ICD-10-CM | POA: Diagnosis not present

## 2024-01-15 DIAGNOSIS — M6281 Muscle weakness (generalized): Secondary | ICD-10-CM | POA: Diagnosis not present

## 2024-01-15 DIAGNOSIS — E039 Hypothyroidism, unspecified: Secondary | ICD-10-CM | POA: Diagnosis not present

## 2024-01-15 NOTE — Progress Notes (Signed)
 Physical Therapy Treatment Patient Details Name: Diamond Lucas MRN: [id number] DOB: 25-Sep-1960 Today's Date: 01/15/2024   History of Present Illness 63 y.o. female who presents for evaluation and treatment of her right knee pain and swelling following an injury which occurred on 12/31/2023.  Apparently she slipped on some grease and fell onto her knee while in her kitchen cooking.  She presented to the emergency room where examination was concerning for a patella tendon rupture.  Ultimately needing surgery having repair 11/ 14.    PT Comments  Pt seen first this am to assist in DC plan.  Per ortho note pt stated she wanted to go home.  When questioned pt stated she wanted to go home but knew she could not manage and needed rehab.  Pt educated on the difference between wanting to go home and being able to navigate home on her own.  She voiced understanding. 2 story townhome Bed and bath upstairs.   1 rail on stairs  1/2 bath downstairs Thermostat control on 2nd level that concerns her about staying on 1st floor only Small threshold step in to home - slab foundation. She has no assist at home Pt voiced concerns over her ability to navigate stairs safely.  Stated she had to remove KI prior in order to get up/down steps in seated position as she was unanble to get up off the floor with KI donned so she would remove it to transition back to chair.  I think I sprained my knee doing that  Pt stated she was in velcro KI prior and now with hinged knee brace she stated it is much heavier and bigger and is concerned that it will not fit in her bathroom even with door removed.  Educated that while brace may be slightly heavier, it is generally the same size but pt continued to argue it.  When asked if she could/would try it she declined stating I will fall down and kill myself  Pt asked if stairs were carpeted and when I said no, but they had grip texture to prevent slipping she continued to not  want to try.    Pt did get up on R side bed with bed features and is able to walk 45' with RW and good adherence to NWB status.  Balance remains impaired especially during transitions to/from walker and with turns in tight areas.  She voids before returning back to bed.  Declined further activity due to fatigue.  Of note, at end of session, pt asking what the likelihood of needing to have her leg amputated would be.  She became fixated on needing her leg amputated above the knee and how she would have to move to a 1 level apartment or get a stair lift installed.  Several times, tried to redirect pt that that was a very unlikely situation and would not be an expected outcome of a patellar tendon rupture and she just needed time to heal before mobility returned to normal.  She again started talking about amputations and she is again reassured amputation would not be considered for her injury.    Discussed with  K. Delores as she is involved with discussion with team regarding discharge planning.  Will continue with current recommendations.       If plan is discharge home, recommend the following: A little help with walking and/or transfers;A little help with bathing/dressing/bathroom;Assistance with cooking/housework;Assist for transportation;Help with stairs or ramp for entrance   Can travel by private vehicle  Equipment Recommendations  None recommended by PT    Recommendations for Other Services       Precautions / Restrictions Precautions Precautions: Fall;Knee Precaution Booklet Issued: No Recall of Precautions/Restrictions: Intact Precaution/Restrictions Comments:  (RLE hinge brace locked in extension) Required Braces or Orthoses: Knee Immobilizer - Right Knee Immobilizer - Right: On at all times Restrictions Weight Bearing Restrictions Per Provider Order: Yes RLE Weight Bearing Per Provider Order: Touchdown weight bearing     Mobility  Bed Mobility Overal bed mobility:  Needs Assistance Bed Mobility: Supine to Sit, Sit to Supine     Supine to sit: Contact guard, HOB elevated, Used rails Sit to supine: Min assist, HOB elevated, Used rails     Patient Response: Cooperative, Anxious  Transfers Overall transfer level: Needs assistance Equipment used: Rolling walker (2 wheels) Transfers: Sit to/from Stand Sit to Stand: Min assist, Contact guard assist                Ambulation/Gait Ambulation/Gait assistance: Min assist, Contact guard assist Gait Distance (Feet): 45 Feet Assistive device: Rolling walker (2 wheels) Gait Pattern/deviations: Step-to pattern Gait velocity: decreased     General Gait Details: able to maintain TT/NWB today during session   Stairs         General stair comments: refused to try I will fall down and kill myself   Wheelchair Mobility     Tilt Bed Tilt Bed Patient Response: Cooperative, Anxious  Modified Rankin (Stroke Patients Only)       Balance Overall balance assessment: Needs assistance Sitting-balance support: No upper extremity supported Sitting balance-Leahy Scale: Good     Standing balance support: Bilateral upper extremity supported Standing balance-Leahy Scale: Good Standing balance comment: able to maintain NWBing on R, mild unsteadiness in standing with anxious effort                            Communication Communication Communication: No apparent difficulties  Cognition Arousal: Alert Behavior During Therapy: Anxious, WFL for tasks assessed/performed   PT - Cognitive impairments: No apparent impairments                       PT - Cognition Comments: anxiety and generally odd interactions during session Following commands: Intact      Cueing Cueing Techniques: Verbal cues, Visual cues, Gestural cues  Exercises Other Exercises Other Exercises: to bathroom to void    General Comments        Pertinent Vitals/Pain Pain Assessment Pain Assessment:  Faces Faces Pain Scale: Hurts a little bit Pain Descriptors / Indicators: Aching, Discomfort Pain Intervention(s): Limited activity within patient's tolerance, Monitored during session, Repositioned, Ice applied    Home Living                          Prior Function            PT Goals (current goals can now be found in the care plan section) Progress towards PT goals: Progressing toward goals    Frequency    7X/week      PT Plan      Co-evaluation              AM-PAC PT 6 Clicks Mobility   Outcome Measure  Help needed turning from your back to your side while in a flat bed without using bedrails?: None Help needed moving from lying on your back  to sitting on the side of a flat bed without using bedrails?: A Little Help needed moving to and from a bed to a chair (including a wheelchair)?: A Little Help needed standing up from a chair using your arms (e.g., wheelchair or bedside chair)?: A Little Help needed to walk in hospital room?: A Little Help needed climbing 3-5 steps with a railing? : A Lot 6 Click Score: 18    End of Session Equipment Utilized During Treatment: Gait belt;Right knee immobilizer Activity Tolerance: Patient tolerated treatment well;Patient limited by pain Patient left: with bed alarm set;with call bell/phone within reach Nurse Communication: Mobility status PT Visit Diagnosis: Unsteadiness on feet (R26.81);Muscle weakness (generalized) (M62.81);Difficulty in walking, not elsewhere classified (R26.2);Pain Pain - Right/Left: Right Pain - part of body: Knee     Time: 9168-9154 PT Time Calculation (min) (ACUTE ONLY): 14 min  Charges:    $Gait Training: 8-22 mins PT General Charges $$ ACUTE PT VISIT: 1 Visit                   Lauraine Gills, PTA 01/15/24, 9:07 AM
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
## 2024-01-15 NOTE — Progress Notes (Signed)
  Subjective: 6 Days Post-Op Procedure(s) (LRB): REPAIR, TENDON, PATELLAR (Right) Patient reports pain as mild.   Patient is well, and has had no acute complaints or problems Discharge planning pending psychiatric evaluation Negative for chest pain and shortness of breath Fever: no Gastrointestinal: Negative for nausea and vomiting. Still working on BM  Objective: Vital signs in last 24 hours: Temp:  [98 F (36.7 C)-98.4 F (36.9 C)] 98 F (36.7 C) (10/20 0755) Pulse Rate:  [86-98] 86 (10/20 0755) Resp:  [16-17] 17 (10/20 0755) BP: (132-153)/(73-87) 132/80 (10/20 0755) SpO2:  [96 %-99 %] 96 % (10/20 0755)  Intake/Output from previous day:  Intake/Output Summary (Last 24 hours) at 01/15/2024 1159 Last data filed at 01/14/2024 1300 Gross per 24 hour  Intake 240 ml  Output --  Net 240 ml     Intake/Output this shift: No intake/output data recorded.  Labs: No results for input(s): HGB in the last 72 hours.  No results for input(s): WBC, RBC, HCT, PLT in the last 72 hours.  No results for input(s): NA, K, CL, CO2, BUN, CREATININE, GLUCOSE, CALCIUM  in the last 72 hours.  No results for input(s): LABPT, INR in the last 72 hours.   EXAM General - Patient is Alert and Oriented Extremity - Neurovascular intact Sensation intact distally Dorsiflexion/Plantar flexion intact Dressing/Incision - Minimal drainage, Aquacell bandage intact. Knee immobilizer in place and locked in extension. Motor Function - intact, moving foot and toes well on exam.  She ambulated 40 feet with physical therapy  Past Medical History:  Diagnosis Date   Allergy    Anxiety 2004   see a psychotherapist   Arrhythmia    Cancer ([facility]CC) 1994   metastatic thyroid  cancer   Cervical radiculopathy 03/09/2021   Depression 2004   see a psychotherapist   GERD (gastroesophageal reflux disease) 2021   from pregnancy or Synthroid ?   Heart murmur re-occuring 2021   visit with  Cardiologist this month   History of chicken pox    History of measles    History of syncope    Hypertension    Osteoarthritis    Palpitations    Pure hypercholesterolemia    Rosacea    Schizophrenia (HCC)    Thyroid  disease    Ulcer 2021   yes possibly but not sure    Assessment/Plan: 6 Days Post-Op Procedure(s) (LRB): REPAIR, TENDON, PATELLAR (Right) Principal Problem:   Rupture of right patellar tendon  Estimated body mass index is 27.34 kg/m as calculated from the following:   Height as of this encounter: 5' (1.524 m).   Weight as of this encounter: 63.5 kg. Advance diet Up with therapy D/C IV fluids   VSS Continue to work with PT and OT. Toe-touch Weight-Bearing to right leg in knee immobilizer BM+  Psych consulted for evaluation that put patient at a low risk for self harm at this juncture and state that there are no barriers to discharge at this point.  PT continues to work with patient and states she is doing well, patient has voiced many concerns about safely ambulating up/down stairs as well as navigating her own home with her knee in a KI brace. TOC has been re-consulted and are looking for placement for rehab.  Discharge planning: deemed safe for DC from psych evaluation - TOC to help with placement into SNF DVT Prophylaxis - Lovenox Follow [facility] with [facility] in 10-14 days for re-evaluation  Diamond CHARLENA Koyanagi, Diamond Lucas [facility] 01/15/2024, 11:59 AM

## 2024-01-16 DIAGNOSIS — I10 Essential (primary) hypertension: Secondary | ICD-10-CM | POA: Diagnosis not present

## 2024-01-16 DIAGNOSIS — R262 Difficulty in walking, not elsewhere classified: Secondary | ICD-10-CM | POA: Diagnosis not present

## 2024-01-16 DIAGNOSIS — F209 Schizophrenia, unspecified: Secondary | ICD-10-CM | POA: Diagnosis not present

## 2024-01-16 DIAGNOSIS — I872 Venous insufficiency (chronic) (peripheral): Secondary | ICD-10-CM | POA: Diagnosis not present

## 2024-01-16 DIAGNOSIS — S76191D Other specified injury of right quadriceps muscle, fascia and tendon, subsequent encounter: Secondary | ICD-10-CM | POA: Diagnosis not present

## 2024-01-16 DIAGNOSIS — M66269 Spontaneous rupture of extensor tendons, unspecified lower leg: Secondary | ICD-10-CM | POA: Diagnosis not present

## 2024-01-16 DIAGNOSIS — S86811A Strain of other muscle(s) and tendon(s) at lower leg level, right leg, initial encounter: Secondary | ICD-10-CM | POA: Diagnosis not present

## 2024-01-16 DIAGNOSIS — M6281 Muscle weakness (generalized): Secondary | ICD-10-CM | POA: Diagnosis not present

## 2024-01-16 DIAGNOSIS — Z7982 Long term (current) use of aspirin: Secondary | ICD-10-CM | POA: Diagnosis not present

## 2024-01-16 DIAGNOSIS — F312 Bipolar disorder, current episode manic severe with psychotic features: Secondary | ICD-10-CM | POA: Diagnosis not present

## 2024-01-16 DIAGNOSIS — C73 Malignant neoplasm of thyroid gland: Secondary | ICD-10-CM | POA: Diagnosis not present

## 2024-01-16 DIAGNOSIS — M5412 Radiculopathy, cervical region: Secondary | ICD-10-CM | POA: Diagnosis not present

## 2024-01-16 DIAGNOSIS — H04123 Dry eye syndrome of bilateral lacrimal glands: Secondary | ICD-10-CM | POA: Diagnosis not present

## 2024-01-16 DIAGNOSIS — M62838 Other muscle spasm: Secondary | ICD-10-CM | POA: Diagnosis not present

## 2024-01-16 DIAGNOSIS — I83819 Varicose veins of unspecified lower extremities with pain: Secondary | ICD-10-CM | POA: Diagnosis not present

## 2024-01-16 DIAGNOSIS — K621 Rectal polyp: Secondary | ICD-10-CM | POA: Diagnosis not present

## 2024-01-16 DIAGNOSIS — E785 Hyperlipidemia, unspecified: Secondary | ICD-10-CM | POA: Diagnosis not present

## 2024-01-16 DIAGNOSIS — S86811D Strain of other muscle(s) and tendon(s) at lower leg level, right leg, subsequent encounter: Secondary | ICD-10-CM | POA: Diagnosis not present

## 2024-01-16 DIAGNOSIS — K648 Other hemorrhoids: Secondary | ICD-10-CM | POA: Diagnosis not present

## 2024-01-16 DIAGNOSIS — Z9181 History of falling: Secondary | ICD-10-CM | POA: Diagnosis not present

## 2024-01-16 DIAGNOSIS — N6489 Other specified disorders of breast: Secondary | ICD-10-CM | POA: Diagnosis not present

## 2024-01-16 DIAGNOSIS — Z4789 Encounter for other orthopedic aftercare: Secondary | ICD-10-CM | POA: Diagnosis not present

## 2024-01-16 DIAGNOSIS — M722 Plantar fascial fibromatosis: Secondary | ICD-10-CM | POA: Diagnosis not present

## 2024-01-16 DIAGNOSIS — W19XXXD Unspecified fall, subsequent encounter: Secondary | ICD-10-CM | POA: Diagnosis not present

## 2024-01-16 DIAGNOSIS — B852 Pediculosis, unspecified: Secondary | ICD-10-CM | POA: Diagnosis not present

## 2024-01-16 DIAGNOSIS — F200 Paranoid schizophrenia: Secondary | ICD-10-CM | POA: Diagnosis not present

## 2024-01-16 DIAGNOSIS — K5900 Constipation, unspecified: Secondary | ICD-10-CM | POA: Diagnosis not present

## 2024-01-16 DIAGNOSIS — F419 Anxiety disorder, unspecified: Secondary | ICD-10-CM | POA: Diagnosis not present

## 2024-01-16 DIAGNOSIS — M7732 Calcaneal spur, left foot: Secondary | ICD-10-CM | POA: Diagnosis not present

## 2024-01-16 DIAGNOSIS — M25562 Pain in left knee: Secondary | ICD-10-CM | POA: Diagnosis not present

## 2024-01-16 DIAGNOSIS — F32A Depression, unspecified: Secondary | ICD-10-CM | POA: Diagnosis not present

## 2024-01-16 DIAGNOSIS — E039 Hypothyroidism, unspecified: Secondary | ICD-10-CM | POA: Diagnosis not present

## 2024-01-16 DIAGNOSIS — R87810 Cervical high risk human papillomavirus (HPV) DNA test positive: Secondary | ICD-10-CM | POA: Diagnosis not present

## 2024-01-16 DIAGNOSIS — E7849 Other hyperlipidemia: Secondary | ICD-10-CM | POA: Diagnosis not present

## 2024-01-16 DIAGNOSIS — Z7401 Bed confinement status: Secondary | ICD-10-CM | POA: Diagnosis not present

## 2024-01-16 DIAGNOSIS — F325 Major depressive disorder, single episode, in full remission: Secondary | ICD-10-CM | POA: Diagnosis not present

## 2024-01-16 DIAGNOSIS — K219 Gastro-esophageal reflux disease without esophagitis: Secondary | ICD-10-CM | POA: Diagnosis not present

## 2024-01-16 DIAGNOSIS — Z7901 Long term (current) use of anticoagulants: Secondary | ICD-10-CM | POA: Diagnosis not present

## 2024-01-16 DIAGNOSIS — E7800 Pure hypercholesterolemia, unspecified: Secondary | ICD-10-CM | POA: Diagnosis not present

## 2024-01-16 DIAGNOSIS — R509 Fever, unspecified: Secondary | ICD-10-CM | POA: Diagnosis not present

## 2024-01-16 DIAGNOSIS — Z79899 Other long term (current) drug therapy: Secondary | ICD-10-CM | POA: Diagnosis not present

## 2024-01-16 DIAGNOSIS — Z9889 Other specified postprocedural states: Secondary | ICD-10-CM | POA: Diagnosis not present

## 2024-01-16 DIAGNOSIS — M25561 Pain in right knee: Secondary | ICD-10-CM | POA: Diagnosis not present

## 2024-01-16 DIAGNOSIS — D120 Benign neoplasm of cecum: Secondary | ICD-10-CM | POA: Diagnosis not present

## 2024-01-16 MED ORDER — LEVOTHYROXINE SODIUM 50 MCG PO TABS: 75.0000 ug | ORAL_TABLET | Freq: Every day | ORAL | Status: DC

## 2024-01-16 NOTE — Progress Notes (Signed)
 Physical Therapy Treatment Patient Details Name: Diamond Lucas MRN: [id number] DOB: 09/15/1960 Today's Date: 01/16/2024   History of Present Illness 63 y.o. female who presents for evaluation and treatment of her right knee pain and swelling following an injury which occurred on 12/31/2023.  Apparently she slipped on some grease and fell onto her knee while in her kitchen cooking.  She presented to the emergency room where examination was concerning for a patella tendon rupture.  Ultimately needing surgery having repair 11/ 14.    PT Comments  Pt ready for session.  She needs assist to disconnect polar care but is able self manage LE in and out of bed with hands on assist. Steady in sitting but continues to support LE with hands in sitting and is resistant to resting leg on floor for comfort. She asks to don rubber slide type shoe for gait today reporting multiple itis's in foot/ankle and that shoe is more comfortable.  Shoe safety reviewed but she opts to use slide.  She walks about 5' with RW and short steps which she typically does then takes a large swing through step where shoe catches on floor and needs min a x 1 to correct balance and potentially prevent fall.  She is again educated on proper gait technique and is able to correct back to a more normal gait pattern for her. She stated while she would like to walk complete lap she is worried she will get too dehydrated.  Education provided regarding safe gait and given NWB gait it would be fatiguing for her to try to walk that far and but dehydration would not be a concern. She returns to room where she is able to complete standing ex with RLE and walker support.  Discussed TTWB vs NWB and pt is allowed to TTWB but when she even gently places toe on the floor she quickly picks is up and says it is too painful  Stated she had seen podiatry prior due to c/o pain and x-rays were completed and negative per pt.  Pt is returned to bed with needs  met.  Anticipate transition to rehab today which is appropriate.    If plan is discharge home, recommend the following: A little help with walking and/or transfers;A little help with bathing/dressing/bathroom;Assistance with cooking/housework;Assist for transportation;Help with stairs or ramp for entrance   Can travel by private vehicle        Equipment Recommendations  Rolling walker (2 wheels);BSC/3in1;Wheelchair (measurements PT);Wheelchair cushion (measurements PT)    Recommendations for Other Services       Precautions / Restrictions Precautions Precautions: Fall;Knee Precaution Booklet Issued: No Recall of Precautions/Restrictions: Intact Precaution/Restrictions Comments:  (RLE hinge brace locked in extension) Required Braces or Orthoses: Knee Immobilizer - Right Knee Immobilizer - Right: On at all times Restrictions Weight Bearing Restrictions Per Provider Order: Yes RLE Weight Bearing Per Provider Order: Touchdown weight bearing     Mobility  Bed Mobility Overal bed mobility: Modified Independent Bed Mobility: Supine to Sit, Sit to Supine     Supine to sit: Modified independent (Device/Increase time), Used rails Sit to supine: HOB elevated, Used rails, Modified independent (Device/Increase time)     Patient Response: Cooperative  Transfers Overall transfer level: Needs assistance Equipment used: Rolling walker (2 wheels) Transfers: Sit to/from Stand Sit to Stand: Min assist, Contact guard assist                Ambulation/Gait Ambulation/Gait assistance: Min assist, Contact guard assist Gait Distance (Feet):  50 Feet Assistive device: Rolling walker (2 wheels) Gait Pattern/deviations: Step-to pattern Gait velocity: decreased     General Gait Details: able to maintain TT/NWB today during session   Stairs             Wheelchair Mobility     Tilt Bed Tilt Bed Patient Response: Cooperative  Modified Rankin (Stroke Patients Only)        Balance Overall balance assessment: Needs assistance Sitting-balance support: No upper extremity supported Sitting balance-Leahy Scale: Good     Standing balance support: Bilateral upper extremity supported Standing balance-Leahy Scale: Good                              Communication Communication Communication: No apparent difficulties  Cognition Arousal: Alert Behavior During Therapy: Anxious, WFL for tasks assessed/performed   PT - Cognitive impairments: History of cognitive impairments                       PT - Cognition Comments: anxiety and generally odd interactions during session Following commands: Intact      Cueing Cueing Techniques: Verbal cues, Visual cues, Gestural cues  Exercises Other Exercises Other Exercises: standing AROM wtih walker support    General Comments        Pertinent Vitals/Pain Pain Assessment Pain Assessment: Faces Faces Pain Scale: Hurts a little bit Pain Descriptors / Indicators: Aching, Discomfort Pain Intervention(s): Limited activity within patient's tolerance, Monitored during session, Repositioned, Ice applied    Home Living                          Prior Function            PT Goals (current goals can now be found in the care plan section) Progress towards PT goals: Progressing toward goals    Frequency    7X/week      PT Plan      Co-evaluation              AM-PAC PT 6 Clicks Mobility   Outcome Measure  Help needed turning from your back to your side while in a flat bed without using bedrails?: None Help needed moving from lying on your back to sitting on the side of a flat bed without using bedrails?: A Little Help needed moving to and from a bed to a chair (including a wheelchair)?: A Little Help needed standing up from a chair using your arms (e.g., wheelchair or bedside chair)?: A Little Help needed to walk in hospital room?: A Little Help needed climbing 3-5  steps with a railing? : Total 6 Click Score: 17    End of Session Equipment Utilized During Treatment: Gait belt;Right knee immobilizer Activity Tolerance: Patient tolerated treatment well Patient left: with bed alarm set;with call bell/phone within reach Nurse Communication: Mobility status PT Visit Diagnosis: Unsteadiness on feet (R26.81);Muscle weakness (generalized) (M62.81);Difficulty in walking, not elsewhere classified (R26.2);Pain Pain - Right/Left: Right Pain - part of body: Knee     Time: 1000-1014 PT Time Calculation (min) (ACUTE ONLY): 14 min  Charges:    $Gait Training: 8-22 mins PT General Charges $$ ACUTE PT VISIT: 1 Visit                   Lauraine Gills, PTA 01/16/24, 10:36 AM

## 2024-01-16 NOTE — Plan of Care (Signed)
  Problem: Health Behavior/Discharge Planning: Goal: Ability to manage health-related needs will improve Outcome: Progressing   Problem: Clinical Measurements: Goal: Diagnostic test results will improve Outcome: Progressing   Problem: Activity: Goal: Risk for activity intolerance will decrease Outcome: Progressing   Problem: Nutrition: Goal: Adequate nutrition will be maintained Outcome: Progressing   

## 2024-01-16 NOTE — Evaluation (Addendum)
 Occupational Therapy Evaluation Patient Details Name: Diamond Lucas MRN: [id number] DOB: 05/21/1960 Today's Date: 01/16/2024   History of Present Illness   63 y.o. female who presents for evaluation and treatment of her right knee pain and swelling following an injury which occurred on 12/31/2023.  Apparently she slipped on some grease and fell onto her knee while in her kitchen cooking.  She presented to the emergency room where examination was concerning for a patella tendon rupture.  Ultimately needing surgery having repair 11/ 14.     Clinical Impressions Chart reviewed to date, pt greeted semi supine in bed, alert and orientedx4, tangential and anxious at times but does follow commands appropriately. PTA pt reports she is MOD I-I in ADL/IADL, amb with no AD. Pt presents with deficits in balance, activity tolerance affecting safe and optimal ADL completion. She requires MIN A for STS off toilet with frequent verbal cues for technique (she reports I am not sure I can do this but agreeable to attempt), MAX A for LB dressing, including adjusting KI. Pt performs more NWBIng than TTWBing. She is performing ADL/functional mobility below PLOF, will benefit from acute OT to address functional deficits and to facilitate optimal ADL performance. Pt is left in bed, all needs met. OT will follow acutely.      If plan is discharge home, recommend the following:   A little help with walking and/or transfers;A lot of help with bathing/dressing/bathroom;Assistance with cooking/housework     Functional Status Assessment   Patient has had a recent decline in their functional status and demonstrates the ability to make significant improvements in function in a reasonable and predictable amount of time.     Equipment Recommendations   BSC/3in1;Other (comment);Wheelchair (measurements OT);Tub/shower seat (2WW)     Recommendations for Other Services          Precautions/Restrictions   Precautions Precautions: Fall;Knee Recall of Precautions/Restrictions: Intact Precaution/Restrictions Comments: RLE hinge brace locked in extension Required Braces or Orthoses: Knee Immobilizer - Right Knee Immobilizer - Right: On at all times Restrictions Weight Bearing Restrictions Per Provider Order: Yes RLE Weight Bearing Per Provider Order: Touchdown weight bearing     Mobility Bed Mobility Overal bed mobility: Needs Assistance Bed Mobility: Supine to Sit, Sit to Supine     Supine to sit: Modified independent (Device/Increase time) Sit to supine: Contact guard assist        Transfers Overall transfer level: Needs assistance Equipment used: Rolling walker (2 wheels) Transfers: Sit to/from Stand Sit to Stand: Contact guard assist, Min assist                  Balance Overall balance assessment: Needs assistance Sitting-balance support: No upper extremity supported Sitting balance-Leahy Scale: Good     Standing balance support: Bilateral upper extremity supported, During functional activity, Reliant on assistive device for balance Standing balance-Leahy Scale: Good                             ADL either performed or assessed with clinical judgement   ADL Overall ADL's : Needs assistance/impaired Eating/Feeding: Set up;Sitting   Grooming: Set up;Sitting               Lower Body Dressing: Maximal assistance Lower Body Dressing Details (indicate cue type and reason): KI management Toilet Transfer: Minimal assistance;Rolling walker (2 wheels);Regular Toilet;Grab bars Toilet Transfer Details (indicate cue type and reason): MIN A for sit<>stand off low toilet with  frequent verbal cues for technique Toileting- Clothing Manipulation and Hygiene: Supervision/safety;Sitting/lateral lean Toileting - Clothing Manipulation Details (indicate cue type and reason): continent urine on toilet     Functional mobility during  ADLs: Supervision/safety;Rolling walker (2 wheels) (approx 15', then 25')       Vision Patient Visual Report: No change from baseline       Perception         Praxis         Pertinent Vitals/Pain Pain Assessment Pain Assessment: 0-10 Pain Score: 9  Pain Location: RLE when unsupported; pain did not limit session progression Pain Descriptors / Indicators: Aching, Discomfort Pain Intervention(s): Repositioned, Premedicated before session, Monitored during session, Ice applied     Extremity/Trunk Assessment Upper Extremity Assessment Upper Extremity Assessment: Overall WFL for tasks assessed   Lower Extremity Assessment Lower Extremity Assessment: Defer to PT evaluation       Communication Communication Communication: No apparent difficulties   Cognition Arousal: Alert Behavior During Therapy: Anxious, WFL for tasks assessed/performed Cognition: History of cognitive impairments, Cognition impaired           Executive functioning impairment (select all impairments): Reasoning OT - Cognition Comments: tangential at times                 Following commands: Intact       Cueing  General Comments   Cueing Techniques: Verbal cues;Visual cues;Gestural cues  vss   Exercises Other Exercises Other Exercises: edu pt re role of OT, role of rehab, discharge recommendations, use of gait belt as a leg lifter, improved ADL performance with AE/DME   Shoulder Instructions      Home Living Family/patient expects to be discharged to:: Private residence Living Arrangements: Alone Available Help at Discharge: Friend(s) (inconsistent) Type of Home: Apartment (two story townhome)       Home Layout: Two level Alternate Level Stairs-Number of Steps: flight             Home Equipment: BSC/3in1          Prior Functioning/Environment Prior Level of Function : Independent/Modified Independent;History of Falls (last six months)             Mobility  Comments: report amb with no AD ADLs Comments: reports MOD I-I in ADL/IADL    OT Problem List: Decreased activity tolerance;Impaired balance (sitting and/or standing);Decreased knowledge of use of DME or AE   OT Treatment/Interventions: Self-care/ADL training;Balance training;Therapeutic exercise;Therapeutic activities;DME and/or AE instruction;Patient/family education;Energy conservation      OT Goals(Current goals can be found in the care plan section)   Acute Rehab OT Goals Patient Stated Goal: rehab OT Goal Formulation: With patient Time For Goal Achievement: 01/30/24 Potential to Achieve Goals: Good ADL Goals Pt Will Perform Grooming: with modified independence;sitting Pt Will Perform Lower Body Dressing: with modified independence;sit to/from stand;sitting/lateral leans Pt Will Transfer to Toilet: with modified independence;ambulating Pt Will Perform Toileting - Clothing Manipulation and hygiene: with modified independence;sitting/lateral leans;sit to/from stand   OT Frequency:  Min 2X/week    Co-evaluation              AM-PAC OT 6 Clicks Daily Activity     Outcome Measure Help from another person eating meals?: None Help from another person taking care of personal grooming?: None Help from another person toileting, which includes using toliet, bedpan, or urinal?: None Help from another person bathing (including washing, rinsing, drying)?: A Little Help from another person to put on and taking off regular upper body  clothing?: None Help from another person to put on and taking off regular lower body clothing?: A Lot 6 Click Score: 21   End of Session Equipment Utilized During Treatment: Rolling walker (2 wheels) Nurse Communication: Mobility status  Activity Tolerance: Patient tolerated treatment well Patient left: in bed;with call bell/phone within reach;with bed alarm set  OT Visit Diagnosis: Other abnormalities of gait and mobility (R26.89)                 Time: 9091-9075 OT Time Calculation (min): 16 min Charges:  OT General Charges $OT Visit: 1 Visit OT Evaluation $OT Eval Low Complexity: 1 Low  Therisa Sheffield, OTD OTR/L  01/16/24, 10:38 AM

## 2024-01-16 NOTE — Progress Notes (Signed)
 Writer attempted to call report to [facility], with no answer from facility. Transport to transport patient to [facility]. Discharge packet to be given to transportation at time of discharge. IV removed without complications. Patient belongings to go with patient to facility.

## 2024-01-16 NOTE — Progress Notes (Signed)
 Patient is refusing the 0600 dose of Levothyroxine  as she reports that she takes 75mcg at home and the dose that was ordered in the hospital is 100mcg. Nurse educated the patient on the medication and the order. Patient reports that she spoke with Dr.Poggi yesterday via MyChart and it was supposed to be updated. Nurse informed that patient that this can addressed when the provider is in doing rounds this AM.  Patient reports that she needs to take at least an hour before breakfast. Nurse verbalized understanding and will pass this message along to day shift nurse for follow-up.

## 2024-01-16 NOTE — Progress Notes (Signed)
  Subjective: 7 Days Post-Op Procedure(s) (LRB): REPAIR, TENDON, PATELLAR (Right) Patient reports pain as mild.   Patient is well, and has had no acute complaints or problems Discharge planning pending psychiatric evaluation Negative for chest pain and shortness of breath Fever: no Gastrointestinal: Negative for nausea and vomiting. Still working on BM  Objective: Vital signs in last 24 hours: Temp:  [98.1 F (36.7 C)-98.3 F (36.8 C)] 98.2 F (36.8 C) (10/21 0749) Pulse Rate:  [78-94] 78 (10/21 0749) Resp:  [16-17] 17 (10/21 0749) BP: (131-153)/(69-89) 153/79 (10/21 0749) SpO2:  [95 %-99 %] 98 % (10/21 0749)  Intake/Output from previous day:  Intake/Output Summary (Last 24 hours) at 01/16/2024 0816 Last data filed at 01/15/2024 1937 Gross per 24 hour  Intake 240 ml  Output --  Net 240 ml     Intake/Output this shift: No intake/output data recorded.  Labs: No results for input(s): HGB in the last 72 hours.  No results for input(s): WBC, RBC, HCT, PLT in the last 72 hours.  No results for input(s): NA, K, CL, CO2, BUN, CREATININE, GLUCOSE, CALCIUM  in the last 72 hours.  No results for input(s): LABPT, INR in the last 72 hours.   EXAM General - Patient is Alert and Oriented Extremity - Neurovascular intact Sensation intact distally Dorsiflexion/Plantar flexion intact Dressing/Incision - Minimal drainage, Aquacell bandage intact. Knee immobilizer in place and locked in extension. Motor Function - intact, moving foot and toes well on exam.  She ambulated 40 feet with physical therapy  Past Medical History:  Diagnosis Date   Allergy    Anxiety 2004   see a psychotherapist   Arrhythmia    Cancer [facility]) 1994   metastatic thyroid  cancer   Cervical radiculopathy 03/09/2021   Depression 2004   see a psychotherapist   GERD (gastroesophageal reflux disease) 2021   from pregnancy or Synthroid ?   Heart murmur re-occuring 2021   visit  with Cardiologist this month   History of chicken pox    History of measles    History of syncope    Hypertension    Osteoarthritis    Palpitations    Pure hypercholesterolemia    Rosacea    Schizophrenia (HCC)    Thyroid  disease    Ulcer 2021   yes possibly but not sure    Assessment/Plan: 7 Days Post-Op Procedure(s) (LRB): REPAIR, TENDON, PATELLAR (Right) Principal Problem:   Rupture of right patellar tendon  Estimated body mass index is 27.34 kg/m as calculated from the following:   Height as of this encounter: 5' (1.524 m).   Weight as of this encounter: 63.5 kg. Advance diet Up with therapy D/C IV fluids   VSS Continue to work with PT and OT. Toe-touch Weight-Bearing to right leg in knee immobilizer BM+  Psych consulted for evaluation that put patient at a low risk for self harm at this juncture and state that there are no barriers to discharge at this point.  PT continues to work with patient and states she is doing well, patient has voiced many concerns about safely ambulating up/down stairs as well as navigating her own home with her knee in a KI brace. TOC has been re-consulted and  found placement to rehab, safe for discharge  Discharge planning: deemed safe for DC from psych evaluation - TOC found placement DVT Prophylaxis - Lovenox Follow up with [facility] in 10-14 days for re-evaluation  Fonda CHARLENA Koyanagi, PA-C Orthopaedic Surgery 01/16/2024, 8:16 AM

## 2024-01-16 NOTE — TOC Transition Note (Signed)
 Transition of Care [facility]) - Discharge Note   Patient Details  Name: Diamond Lucas MRN: [id number] Date of Birth: 1960/12/04  Transition of [facility]) CM/SW Contact:  Alvaro Louder, LCSW Phone Number: 01/16/2024, 1:57 PM   Clinical Narrative:   LCSWA received insurance approval for patient to admit to SNF [facility]. LCSWA confirmed with MD that patient is stable for discharge. LCSWA notified the patient and they are in agreement with discharge. LCSWA confirmed bed is available at SNF. Transport arranged with [facility] for next available.  Number to call report: [phone], [phone]   TOC signing off   Final next level of care: Skilled Nursing Facility Barriers to Discharge: No Barriers Identified   Patient Goals and CMS Choice            Discharge Placement              Patient chooses bed at: [facility] Patient to be transferred to facility by: [facility] Name of family member notified: Self Patient and family notified of of transfer: 01/16/24  Discharge Plan and Services Additional resources added to the After Visit Summary for                                       Social Drivers of Health (SDOH) Interventions SDOH Screenings   Food Insecurity: Food Insecurity Present (01/09/2024)  Housing: Low Risk  (01/09/2024)  Transportation Needs: No Transportation Needs (01/09/2024)  Utilities: Not At Risk (01/09/2024)  Alcohol Screen: Low Risk  (09/06/2023)  Depression (PHQ2-9): Low Risk  (12/19/2023)  Financial Resource Strain: High Risk (11/30/2023)  Physical Activity: Inactive (11/30/2023)  Social Connections: Moderately Integrated (11/30/2023)  Recent Concern: Social Connections - Moderately Isolated (09/06/2023)  Stress: No Stress Concern Present (11/30/2023)  Tobacco Use: Low Risk  (01/09/2024)  Health Literacy: Adequate Health Literacy (09/06/2023)     Readmission Risk Interventions     No data to display

## 2024-01-16 NOTE — Discharge Summary (Signed)
 Physician Discharge Summary  Subjective: 7 Days Post-Op Procedure(s) (LRB): REPAIR, TENDON, PATELLAR (Right) Patient reports pain as mild.   Patient seen in rounds with Dr. Edie. Patient is well, and has had no acute complaints or problems Patient is ready to go to SNF  Physician Discharge Summary  Patient ID: Diamond Lucas MRN: [id number] DOB/AGE: 1960/11/11 63 y.o.  Admit date: 01/09/2024 Discharge date: 01/16/2024  Admission Diagnoses:  Discharge Diagnoses:  Principal Problem:   Rupture of right patellar tendon   Discharged Condition: good  Hospital Course: Patient presented to the hospital on 01/09/2024 for an acute patellar tendon rupture which was surgically fixed by Dr. Edie. See procedural details below. Patient did okay postoperatrively. She worked well with PT, however, had some delays to discharge secondary to her underlying psychiatric conditions. Patient was evaluated by psych and deemed safe for DC. TOC assisted with placement into SNF. Placement found to [facility], safe to discharge on 01/16/2024 to SNF  Procedure:   Primary repair of acute patellar tendon rupture, right knee.   Surgeon:   DOROTHA Reyes Edie, MD   Assistant:   None   Anesthesia:   General LMA   Findings:   As above.   Complications:   None   EBL:   5 cc   Fluids:   500 cc crystalloid   TT:   100 minutes at 300 mmHg   Drains:   None   Closure:   Staples   Implants:   [employer]hew 2.8 mm Q-Fix anchors x 2, Arthrex 2.6 mm Double-loaded Knotless Knee FiberTak anchors x 2, semitendinosis autograft  Treatments: none  Discharge Exam: Blood pressure (!) 153/79, pulse 78, temperature 98.2 F (36.8 C), resp. rate 17, height 5' (1.524 m), weight 63.5 kg, last menstrual period 03/29/2015, SpO2 98%.   Disposition: Discharge disposition: 03-Skilled Nursing Facility        Allergies as of 01/16/2024       Reactions   Diclofenac Sodium Palpitations   Naproxen  Itching, Rash, Hives   Olanzapine     Other reaction(s): Other Cross-eyed and hyperventilation   Mite (d. Farinae) Itching   Respiratory illness, and itchy eyes, eye problems Respiratory illness, and itchy eyes, eye problems   Nsaids Other (See Comments)   Other reaction(s): Other (See Comments) Aleve causes hives 10/15 pt states she occ. Takes Motrin . Other reaction(s): Other (See Comments) Aleve causes hives   Other Itching   Respiratory illness, and itchy eyes, eye problems Respiratory illness, and itchy eyes, eye problems   Risperidone     Synthroid  [levothyroxine ]    Brand name only- causes acid reflux   Prednisolone    Eye drops        Medication List     STOP taking these medications    ivermectin  3 MG Tabs tablet Commonly known as: STROMECTOL        TAKE these medications    acetaminophen  500 MG tablet Commonly known as: TYLENOL  Take 2 tablets (1,000 mg total) by mouth every 6 (six) hours as needed.   amLODipine  5 MG tablet Commonly known as: NORVASC  Take 1 tablet (5 mg total) by mouth daily.   CALCIUM  CITRATE PO Take by mouth.   Carboxymeth-Glyc-Polysorb PF 0.5-1-0.5 % Soln Apply 2 drops to eye 2 (two) times daily.   cyclobenzaprine  5 MG tablet Commonly known as: FLEXERIL  Take 1 tablet (5 mg total) by mouth daily as needed for muscle spasms.   enoxaparin 40 MG/0.4ML injection Commonly known as: LOVENOX Inject 0.4  mLs (40 mg total) into the skin daily for 14 days.   Fish Oil 1000 MG Caps Take by mouth. Takes occasionally due to causing acid reflux   MOTRIN  PO Take by mouth as needed (liquid).   Multi-Vitamins Tabs daily.   oxyCODONE 5 MG immediate release tablet Commonly known as: Oxy IR/ROXICODONE Take 1-2 tablets (5-10 mg total) by mouth every 4 (four) hours as needed for moderate pain (pain score 4-6). What changed:  how much to take when to take this reasons to take this   spironolactone 25 MG tablet Commonly known as:  ALDACTONE Take 25 mg by mouth daily.   Tirosint  50 MCG Caps Generic drug: Levothyroxine  Sodium Take 75 mcg by mouth daily.   triamcinolone  55 MCG/ACT Aero nasal inhaler Commonly known as: NASACORT  Place 2 sprays into the nose as needed. Once daily as needed.   UNABLE TO FIND Take 1 capsule by mouth 2 (two) times daily. Med Name: STEV[facility]  (From admission, onward)           Start     Ordered   01/15/24 1254  For home use only DME Other see comment  Once       Comments: Wheelchair with adjustable right lower leg support  Question:  Length of Need  Answer:  [facility]   01/15/24 1253   01/09/24 1254  DME Walker rolling  Once       Question Answer Comment  Walker: With 5 Inch Wheels   Patient needs a walker to treat with the following condition Rupture of right patellar tendon      01/09/24 1253   01/09/24 1254  DME 3 n 1  Once        01/09/24 1253            Contact information for follow-up providers     Kip [name] Double, PA-C Follow up in 2 week(s).   Specialty: Physician Assistant Why: For wound care and staple or suture removal. Contact information: [address] [place] [address] [phone]              Contact information for after-discharge care     Destination     [facility] .   Service: Skilled Nursing Contact information: [address] [place]  [address] [phone]                     Signed: Sidra Koyanagi 01/16/2024, 1:11 PM   Objective: Vital signs in last 24 hours: Temp:  [98.1 F (36.7 C)-98.3 F (36.8 C)] 98.2 F (36.8 C) (10/21 0749) Pulse Rate:  [78-94] 78 (10/21 0749) Resp:  [16-17] 17 (10/21 0749) BP: (131-153)/(69-89) 153/79 (10/21 0749) SpO2:  [95 %-99 %] 98 % (10/21 0749)  Intake/Output from previous day:  Intake/Output Summary (Last 24 hours) at 01/16/2024  1311 Last data filed at 01/16/2024 0906 Gross per 24 hour  Intake 360 ml  Output --  Net 360 ml    Intake/Output this shift: Total I/O In: 120 [P.O.:120] Out: -   Labs: No results for input(s): HGB in the last 72 hours. No results for input(s): WBC, RBC, HCT, PLT in the last 72 hours. No results for input(s): NA, K, CL, CO2, BUN, CREATININE, GLUCOSE, CALCIUM  in the last 72 hours. No results for input(s): LABPT, INR in the last 72 hours.  EXAM: General - Patient is Alert, Appropriate, and Oriented Extremity - Neurologically intact Neurovascular intact Sensation intact distally Intact pulses distally Dorsiflexion/Plantar flexion intact No cellulitis present Compartment soft Incision - clean, dry, scant drainage along midline incision Motor Function -  able to wiggle toes and dorsi/plantar flex without issue  Assessment/Plan: 7 Days Post-Op Procedure(s) (LRB): REPAIR, TENDON, PATELLAR (Right) Procedure(s) (LRB): REPAIR, TENDON, PATELLAR (Right) Past Medical History:  Diagnosis Date   Allergy    Anxiety 2004   see a psychotherapist   Arrhythmia    Cancer [facility]) 1994   metastatic thyroid  cancer   Cervical radiculopathy 03/09/2021   Depression 2004   see a psychotherapist   GERD (gastroesophageal reflux disease) 2021   from pregnancy or Synthroid ?   Heart murmur re-occuring 2021   visit with Cardiologist this month   History of chicken pox    History of measles    History of syncope    Hypertension    Osteoarthritis    Palpitations    Pure hypercholesterolemia    Rosacea    Schizophrenia (HCC)    Thyroid  disease    Ulcer 2021   yes possibly but not sure   Principal Problem:   Rupture of right patellar tendon  Estimated body mass index is 27.34 kg/m as calculated from the following:   Height as of this encounter: 5' (1.524 m).   Weight as of this encounter: 63.5 kg.  Diet - Regular diet Follow up - in 2 weeks Activity -  WBAT in knee brace in full extension Disposition - Skilled nursing facility Condition Upon Discharge - Good DVT Prophylaxis - Lovenox  Gunther Zawadzki E. Davinci Glotfelty, PA-C Orthopaedic Surgery 01/16/2024, 1:11 PM

## 2024-01-17 DIAGNOSIS — I10 Essential (primary) hypertension: Secondary | ICD-10-CM | POA: Diagnosis not present

## 2024-01-17 DIAGNOSIS — E039 Hypothyroidism, unspecified: Secondary | ICD-10-CM | POA: Diagnosis not present

## 2024-01-17 DIAGNOSIS — Z7901 Long term (current) use of anticoagulants: Secondary | ICD-10-CM | POA: Diagnosis not present

## 2024-01-17 DIAGNOSIS — Z4789 Encounter for other orthopedic aftercare: Secondary | ICD-10-CM | POA: Diagnosis not present

## 2024-01-17 DIAGNOSIS — M5412 Radiculopathy, cervical region: Secondary | ICD-10-CM | POA: Diagnosis not present

## 2024-01-17 DIAGNOSIS — F209 Schizophrenia, unspecified: Secondary | ICD-10-CM | POA: Diagnosis not present

## 2024-01-17 DIAGNOSIS — M7732 Calcaneal spur, left foot: Secondary | ICD-10-CM | POA: Diagnosis not present

## 2024-01-17 DIAGNOSIS — M25562 Pain in left knee: Secondary | ICD-10-CM | POA: Diagnosis not present

## 2024-01-17 DIAGNOSIS — S76191D Other specified injury of right quadriceps muscle, fascia and tendon, subsequent encounter: Secondary | ICD-10-CM | POA: Diagnosis not present

## 2024-01-17 DIAGNOSIS — M722 Plantar fascial fibromatosis: Secondary | ICD-10-CM | POA: Diagnosis not present

## 2024-01-17 DIAGNOSIS — W19XXXD Unspecified fall, subsequent encounter: Secondary | ICD-10-CM | POA: Diagnosis not present

## 2024-01-17 DIAGNOSIS — F32A Depression, unspecified: Secondary | ICD-10-CM | POA: Diagnosis not present

## 2024-01-17 DIAGNOSIS — E7849 Other hyperlipidemia: Secondary | ICD-10-CM | POA: Diagnosis not present

## 2024-01-17 DIAGNOSIS — C73 Malignant neoplasm of thyroid gland: Secondary | ICD-10-CM | POA: Diagnosis not present

## 2024-01-17 DIAGNOSIS — F419 Anxiety disorder, unspecified: Secondary | ICD-10-CM | POA: Diagnosis not present

## 2024-01-19 DIAGNOSIS — F419 Anxiety disorder, unspecified: Secondary | ICD-10-CM | POA: Diagnosis not present

## 2024-01-19 DIAGNOSIS — E7849 Other hyperlipidemia: Secondary | ICD-10-CM | POA: Diagnosis not present

## 2024-01-19 DIAGNOSIS — Z7901 Long term (current) use of anticoagulants: Secondary | ICD-10-CM | POA: Diagnosis not present

## 2024-01-19 DIAGNOSIS — E039 Hypothyroidism, unspecified: Secondary | ICD-10-CM | POA: Diagnosis not present

## 2024-01-19 DIAGNOSIS — Z4789 Encounter for other orthopedic aftercare: Secondary | ICD-10-CM | POA: Diagnosis not present

## 2024-01-19 DIAGNOSIS — I10 Essential (primary) hypertension: Secondary | ICD-10-CM | POA: Diagnosis not present

## 2024-01-19 DIAGNOSIS — F209 Schizophrenia, unspecified: Secondary | ICD-10-CM | POA: Diagnosis not present

## 2024-01-19 DIAGNOSIS — S76191D Other specified injury of right quadriceps muscle, fascia and tendon, subsequent encounter: Secondary | ICD-10-CM | POA: Diagnosis not present

## 2024-01-19 DIAGNOSIS — Z9181 History of falling: Secondary | ICD-10-CM | POA: Diagnosis not present

## 2024-01-22 DIAGNOSIS — E039 Hypothyroidism, unspecified: Secondary | ICD-10-CM | POA: Diagnosis not present

## 2024-01-22 DIAGNOSIS — I10 Essential (primary) hypertension: Secondary | ICD-10-CM | POA: Diagnosis not present

## 2024-01-22 DIAGNOSIS — K219 Gastro-esophageal reflux disease without esophagitis: Secondary | ICD-10-CM | POA: Diagnosis not present

## 2024-01-22 DIAGNOSIS — E785 Hyperlipidemia, unspecified: Secondary | ICD-10-CM | POA: Diagnosis not present

## 2024-01-22 DIAGNOSIS — F209 Schizophrenia, unspecified: Secondary | ICD-10-CM | POA: Diagnosis not present

## 2024-01-22 DIAGNOSIS — M66269 Spontaneous rupture of extensor tendons, unspecified lower leg: Secondary | ICD-10-CM | POA: Diagnosis not present

## 2024-01-22 DIAGNOSIS — F325 Major depressive disorder, single episode, in full remission: Secondary | ICD-10-CM | POA: Diagnosis not present

## 2024-01-24 DIAGNOSIS — E039 Hypothyroidism, unspecified: Secondary | ICD-10-CM | POA: Diagnosis not present

## 2024-01-24 DIAGNOSIS — Z7901 Long term (current) use of anticoagulants: Secondary | ICD-10-CM | POA: Diagnosis not present

## 2024-01-24 DIAGNOSIS — Z4789 Encounter for other orthopedic aftercare: Secondary | ICD-10-CM | POA: Diagnosis not present

## 2024-01-24 DIAGNOSIS — S76191D Other specified injury of right quadriceps muscle, fascia and tendon, subsequent encounter: Secondary | ICD-10-CM | POA: Diagnosis not present

## 2024-01-24 DIAGNOSIS — F419 Anxiety disorder, unspecified: Secondary | ICD-10-CM | POA: Diagnosis not present

## 2024-01-24 DIAGNOSIS — I10 Essential (primary) hypertension: Secondary | ICD-10-CM | POA: Diagnosis not present

## 2024-01-24 DIAGNOSIS — F209 Schizophrenia, unspecified: Secondary | ICD-10-CM | POA: Diagnosis not present

## 2024-01-24 DIAGNOSIS — Z9181 History of falling: Secondary | ICD-10-CM | POA: Diagnosis not present

## 2024-01-26 DIAGNOSIS — S76191D Other specified injury of right quadriceps muscle, fascia and tendon, subsequent encounter: Secondary | ICD-10-CM | POA: Diagnosis not present

## 2024-01-26 DIAGNOSIS — F209 Schizophrenia, unspecified: Secondary | ICD-10-CM | POA: Diagnosis not present

## 2024-01-26 DIAGNOSIS — K648 Other hemorrhoids: Secondary | ICD-10-CM | POA: Diagnosis not present

## 2024-01-26 DIAGNOSIS — Z9181 History of falling: Secondary | ICD-10-CM | POA: Diagnosis not present

## 2024-01-26 DIAGNOSIS — K5900 Constipation, unspecified: Secondary | ICD-10-CM | POA: Diagnosis not present

## 2024-01-26 DIAGNOSIS — Z4789 Encounter for other orthopedic aftercare: Secondary | ICD-10-CM | POA: Diagnosis not present

## 2024-01-26 DIAGNOSIS — Z7901 Long term (current) use of anticoagulants: Secondary | ICD-10-CM | POA: Diagnosis not present

## 2024-01-26 DIAGNOSIS — F419 Anxiety disorder, unspecified: Secondary | ICD-10-CM | POA: Diagnosis not present

## 2024-01-26 DIAGNOSIS — E039 Hypothyroidism, unspecified: Secondary | ICD-10-CM | POA: Diagnosis not present

## 2024-01-26 DIAGNOSIS — I10 Essential (primary) hypertension: Secondary | ICD-10-CM | POA: Diagnosis not present

## 2024-01-29 DIAGNOSIS — Z4789 Encounter for other orthopedic aftercare: Secondary | ICD-10-CM | POA: Diagnosis not present

## 2024-01-29 DIAGNOSIS — F209 Schizophrenia, unspecified: Secondary | ICD-10-CM | POA: Diagnosis not present

## 2024-01-29 DIAGNOSIS — I10 Essential (primary) hypertension: Secondary | ICD-10-CM | POA: Diagnosis not present

## 2024-01-29 DIAGNOSIS — Z9889 Other specified postprocedural states: Secondary | ICD-10-CM | POA: Diagnosis not present

## 2024-01-29 DIAGNOSIS — Z9181 History of falling: Secondary | ICD-10-CM | POA: Diagnosis not present

## 2024-01-29 DIAGNOSIS — K5900 Constipation, unspecified: Secondary | ICD-10-CM | POA: Diagnosis not present

## 2024-01-29 DIAGNOSIS — F419 Anxiety disorder, unspecified: Secondary | ICD-10-CM | POA: Diagnosis not present

## 2024-01-29 DIAGNOSIS — Z7982 Long term (current) use of aspirin: Secondary | ICD-10-CM | POA: Diagnosis not present

## 2024-01-29 DIAGNOSIS — S76191D Other specified injury of right quadriceps muscle, fascia and tendon, subsequent encounter: Secondary | ICD-10-CM | POA: Diagnosis not present

## 2024-01-29 DIAGNOSIS — M25561 Pain in right knee: Secondary | ICD-10-CM | POA: Diagnosis not present

## 2024-01-29 DIAGNOSIS — S86811D Strain of other muscle(s) and tendon(s) at lower leg level, right leg, subsequent encounter: Secondary | ICD-10-CM | POA: Diagnosis not present

## 2024-01-31 ENCOUNTER — Other Ambulatory Visit: Payer: Self-pay | Admitting: *Deleted

## 2024-01-31 DIAGNOSIS — M25561 Pain in right knee: Secondary | ICD-10-CM | POA: Diagnosis not present

## 2024-01-31 DIAGNOSIS — F209 Schizophrenia, unspecified: Secondary | ICD-10-CM | POA: Diagnosis not present

## 2024-01-31 DIAGNOSIS — S76191D Other specified injury of right quadriceps muscle, fascia and tendon, subsequent encounter: Secondary | ICD-10-CM | POA: Diagnosis not present

## 2024-01-31 DIAGNOSIS — E7800 Pure hypercholesterolemia, unspecified: Secondary | ICD-10-CM | POA: Diagnosis not present

## 2024-01-31 DIAGNOSIS — Z4789 Encounter for other orthopedic aftercare: Secondary | ICD-10-CM | POA: Diagnosis not present

## 2024-01-31 DIAGNOSIS — Z7982 Long term (current) use of aspirin: Secondary | ICD-10-CM | POA: Diagnosis not present

## 2024-01-31 DIAGNOSIS — F419 Anxiety disorder, unspecified: Secondary | ICD-10-CM | POA: Diagnosis not present

## 2024-01-31 DIAGNOSIS — E039 Hypothyroidism, unspecified: Secondary | ICD-10-CM | POA: Diagnosis not present

## 2024-01-31 DIAGNOSIS — I10 Essential (primary) hypertension: Secondary | ICD-10-CM | POA: Diagnosis not present

## 2024-01-31 DIAGNOSIS — Z9181 History of falling: Secondary | ICD-10-CM | POA: Diagnosis not present

## 2024-01-31 DIAGNOSIS — K5900 Constipation, unspecified: Secondary | ICD-10-CM | POA: Diagnosis not present

## 2024-01-31 DIAGNOSIS — M5412 Radiculopathy, cervical region: Secondary | ICD-10-CM | POA: Diagnosis not present

## 2024-01-31 NOTE — Patient Outreach (Signed)
 Ms. Moffitt previously utilized [facility] SNF waiver for admission to [facility]. Per [facility], Ms. Winegarden discharged from [facility] today 01/31/24.  Secure message sent to [facility] SNF social worker and admissions coordinator to inquire about transition details including home health arrangements.   Will await response.  Pablo Hurst, MSN, RN, BSN [place]  [facility], [facility] RN Post- Acute Care Manager Direct Dial: [phone]

## 2024-02-01 ENCOUNTER — Telehealth: Payer: Self-pay

## 2024-02-01 ENCOUNTER — Other Ambulatory Visit: Payer: Self-pay | Admitting: *Deleted

## 2024-02-01 ENCOUNTER — Encounter (INDEPENDENT_AMBULATORY_CARE_PROVIDER_SITE_OTHER): Payer: Self-pay

## 2024-02-01 DIAGNOSIS — I10 Essential (primary) hypertension: Secondary | ICD-10-CM

## 2024-02-01 NOTE — Transitions of Care (Post Inpatient/ED Visit) (Signed)
   02/01/2024  Name: Diamond Lucas MRN: [id number] DOB: 1960-04-26  Today's TOC FU Call Status: Today's TOC FU Call Status:: Successful TOC FU Call Completed TOC FU Call Complete Date: 02/01/24 Patient's Name and Date of Birth confirmed.  Transition Care Management Follow-up Telephone Call Date of Discharge: 01/31/24 Discharge Facility: Other ([facility]) Name of Other ([facility]) Discharge Facility: [facility] Type of Discharge: Inpatient Admission Primary Inpatient Discharge Diagnosis:: rupture right patella How have you been since you were released from the hospital?: Better Any questions or concerns?: No  Items Reviewed: Did you receive and understand the discharge instructions provided?: Yes Medications obtained,verified, and reconciled?: Yes (Medications Reviewed) Any new allergies since your discharge?: No Dietary orders reviewed?: Yes Do you have support at home?: No  Medications Reviewed Today: Medications Reviewed Today   Medications were not reviewed in this encounter     Home Care and Equipment/Supplies: Were Home Health Services Ordered?: Yes Name of Home Health Agency:: [facility] Has Agency set up a time to come to your home?: Yes First Home Health Visit Date: 02/02/24 Any new equipment or medical supplies ordered?: Yes Name of Medical supply agency?: unknown Were you able to get the equipment/medical supplies?: Yes Do you have any questions related to the use of the equipment/supplies?: No  Functional Questionnaire: Do you need assistance with bathing/showering or dressing?: No Do you need assistance with meal preparation?: No Do you need assistance with eating?: No Do you have difficulty maintaining continence: No Do you need assistance with getting out of bed/getting out of a chair/moving?: No Do you have difficulty managing or taking your medications?: No  Follow up appointments reviewed: PCP Follow-up appointment confirmed?: No MD  Provider Line Number:[phone] Given: No Specialist Hospital Follow-up appointment confirmed?: Yes Date of Specialist follow-up appointment?: 02/16/24 Follow-Up Specialty Provider:: ortho Do you need transportation to your follow-up appointment?: No Do you understand care options if your condition(s) worsen?: Yes-patient verbalized understanding    SIGNATURE Julian Lemmings, LPN [facility] Nurse Health Advisor Direct Dial [phone]

## 2024-02-01 NOTE — Patient Outreach (Signed)
   02/01/2024  Diamond Lucas Nov 26, 1960 [id number]   Diamond Lucas discharged from [facility].  [facility] waiver previously utilized for admission to [facility].  Telephone call returned to Diamond Lucas. Diamond Lucas states she read writer's previous note entry in MyChart. Patient identifiers confirmed. Diamond Lucas indicates Diamond Lucas is providing home health services. Diamond Lucas reports recent contact with Home Care Providers. Indicates needing light house work assistance. Lives alone. Home Care Providers telephone number provided to discuss further.   Discussed [facility] CCM follow up. Diamond Lucas agreeable.   Diamond Lucas has medical history of recent knee surgery, HTN.  Will refer to [facility] CCM.  Pablo Hurst, MSN, RN, BSN [place]  [facility], [facility] Commun[facility] RN Post- Acute Care Manager Direct Dial: [phone]

## 2024-02-05 NOTE — Telephone Encounter (Signed)
 She needs to discuss this with her orthopedic

## 2024-02-08 ENCOUNTER — Telehealth: Payer: Self-pay

## 2024-02-08 NOTE — Telephone Encounter (Signed)
 Copied from CRM #[id number]. Topic: Clinical - Home Health Verbal Orders >> Feb 08, 2024 10:32 AM Deaijah H wrote: Caller/Agency: Dacharia OT [facility]  Callback Number: [phone] Service Requested: Occupational Therapy Frequency: 1x a wk for 4wk / phone call the following wk / 1x a wk for 2wk Any new concerns about the patient? Yes, 8/10 pain this morning / weight was 140 on 11/6 and today 145.4

## 2024-02-09 NOTE — Telephone Encounter (Signed)
 Called Dacharia with [facility]. No answer not able to LM. Will try again later.

## 2024-02-09 NOTE — Telephone Encounter (Signed)
Okay to authorize

## 2024-02-12 ENCOUNTER — Encounter (INDEPENDENT_AMBULATORY_CARE_PROVIDER_SITE_OTHER): Payer: Self-pay

## 2024-02-12 ENCOUNTER — Telehealth (INDEPENDENT_AMBULATORY_CARE_PROVIDER_SITE_OTHER): Payer: Self-pay

## 2024-02-12 NOTE — Telephone Encounter (Addendum)
 Patient called and was concerned that she may have possible gangrene or necrosis on her R knee s/p patella reattachment surgery 01/09/24, Dr. Edie suggested she call our office at this time. Spoke to Dr. Jama and he suggested an appointment next week or sooner with ABI.   Dr. Jama received patients images and is going to reach out to Dr. Edie at this time.

## 2024-02-13 ENCOUNTER — Telehealth: Payer: Self-pay

## 2024-02-13 DIAGNOSIS — F324 Major depressive disorder, single episode, in partial remission: Secondary | ICD-10-CM | POA: Diagnosis not present

## 2024-02-13 DIAGNOSIS — F411 Generalized anxiety disorder: Secondary | ICD-10-CM | POA: Diagnosis not present

## 2024-02-13 DIAGNOSIS — F209 Schizophrenia, unspecified: Secondary | ICD-10-CM | POA: Diagnosis not present

## 2024-02-13 NOTE — Telephone Encounter (Signed)
 Copied from CRM #[id number]. Topic: Clinical - Home Health Verbal Orders >> Feb 12, 2024  4:30 PM Jasmin G wrote: Caller/Agency: Ms. Teresa from [facility] Callback Number: [phone] Service Requested: Occupational Therapy Frequency: 1 time a week for 4 weeks, phone call, 1 time a week for 2 weeks Any new concerns about the patient? No

## 2024-02-14 ENCOUNTER — Ambulatory Visit (INDEPENDENT_AMBULATORY_CARE_PROVIDER_SITE_OTHER): Admitting: Vascular Surgery

## 2024-02-14 NOTE — Telephone Encounter (Signed)
 This does look concerning for a potential infection at surgical site. I can write for an antibiotic for her, but I recommend she bee seen in person or in the ED for further evaluation.   Is she had any fevers, chills, warmth at the site?  Where can I send her antibiotic? Also It is noted she is having 8/10 pain. She can take tylenol  extra strength 500mg  (1-2 tablets every 6 to 8 hours) and can take ibuprofen  400mg  every 8 hours.

## 2024-02-15 ENCOUNTER — Other Ambulatory Visit: Payer: Self-pay | Admitting: Family Medicine

## 2024-02-15 DIAGNOSIS — L03119 Cellulitis of unspecified part of limb: Secondary | ICD-10-CM

## 2024-02-15 MED ORDER — SULFAMETHOXAZOLE-TRIMETHOPRIM 800-160 MG PO TABS: 1.0000 | ORAL_TABLET | Freq: Two times a day (BID) | ORAL | 0 refills | Status: AC

## 2024-02-16 NOTE — Telephone Encounter (Signed)
 Spoke with Dacharia and gave OT orders approval

## 2024-02-17 ENCOUNTER — Encounter: Payer: Self-pay | Admitting: Obstetrics and Gynecology

## 2024-02-19 DIAGNOSIS — S86811D Strain of other muscle(s) and tendon(s) at lower leg level, right leg, subsequent encounter: Secondary | ICD-10-CM | POA: Diagnosis not present

## 2024-02-20 DIAGNOSIS — B850 Pediculosis due to Pediculus humanus capitis: Secondary | ICD-10-CM

## 2024-02-20 NOTE — Telephone Encounter (Signed)
 Needs appointment to discuss more.

## 2024-02-21 MED ORDER — PERMETHRIN 5 % EX CREA: TOPICAL_CREAM | CUTANEOUS | 1 refills | Status: AC

## 2024-02-23 ENCOUNTER — Other Ambulatory Visit: Payer: Self-pay

## 2024-02-23 NOTE — Patient Outreach (Addendum)
 Complex Care Management   Visit Note  02/23/2024  Name:  Diamond Lucas MRN: [id number] DOB: [birth year]  Situation: Referral received for Complex Care Management related to HTN I obtained verbal consent from Patient.  Visit completed with Patient  on the phone. Patient declined the program stating there are too many questions and it stresses her.   Background:   Past Medical History:  Diagnosis Date   Allergy    Anxiety 2004   see a psychotherapist   Arrhythmia    Cancer [facility]) 1994   metastatic thyroid  cancer   Cervical radiculopathy 03/09/2021   Depression 2004   see a psychotherapist   GERD (gastroesophageal reflux disease) 2021   from pregnancy or Synthroid ?   Heart murmur re-occuring 2021   visit with Cardiologist this month   History of chicken pox    History of measles    History of syncope    Hypertension    Osteoarthritis    Palpitations    Pure hypercholesterolemia    Rosacea    Schizophrenia (HCC)    Thyroid  disease    Ulcer 2021   yes possibly but not sure    Assessment: Patient Reported Symptoms:  Cognitive Cognitive Status: No symptoms reported Cognitive/Intellectual Conditions Management [RPT]: Other   Health Maintenance Behaviors: Annual physical exam Healing Pattern: Average  Neurological Neurological Review of Symptoms: No symptoms reported Neurological Management Strategies: Adequate rest, Routine screening Neurological Self-Management Outcome: 4 (good)  HEENT HEENT Symptoms Reported: No symptoms reported HEENT Management Strategies: Routine screening HEENT Self-Management Outcome: 4 (good)    Cardiovascular Cardiovascular Symptoms Reported: Swelling in legs or feet Does patient have uncontrolled Hypertension?: Yes Is patient checking Blood Pressure at home?: No Patient's Recent BP reading at home: home health checked Cardiovascular Management Strategies: Routine screening Cardiovascular Self-Management Outcome: 4 (good)   Respiratory Respiratory Symptoms Reported: No symptoms reported Respiratory Management Strategies: Adequate rest Respiratory Self-Management Outcome: 4 (good)  Endocrine Endocrine Symptoms Reported: No symptoms reported Is patient diabetic?: No Endocrine Self-Management Outcome: 4 (good)  Gastrointestinal Gastrointestinal Symptoms Reported: No symptoms reported Gastrointestinal Self-Management Outcome: 4 (good)    Genitourinary Genitourinary Symptoms Reported: No symptoms reported Genitourinary Management Strategies: Adequate rest Genitourinary Self-Management Outcome: 4 (good)  Integumentary Integumentary Symptoms Reported: Incision Additional Integumentary Details: surgical incision Skin Management Strategies: Routine screening Skin Self-Management Outcome: 4 (good)  Musculoskeletal Additional Musculoskeletal Details: post surgery pain Musculoskeletal Management Strategies: Routine screening Musculoskeletal Self-Management Outcome: 4 (good) Falls in the past year?: Yes Number of falls in past year: 2 or more Was there an injury with Fall?: Yes Fall Risk Category Calculator: 3 Patient Fall Risk Level: High Fall Risk Patient at Risk for Falls Due to: History of fall(s) Fall risk Follow up: Falls evaluation completed  Psychosocial Psychosocial Symptoms Reported: No symptoms reported Behavioral Management Strategies: Support group, Medication therapy Behavioral Health Self-Management Outcome: 4 (good) Major Change/Loss/Stressor/Fears (CP): Denies Do you feel physically threatened by others?: No    02/23/2024    PHQ2-9 Depression Screening   Little interest or pleasure in doing things    Feeling down, depressed, or hopeless    PHQ-2 - Total Score    Trouble falling or staying asleep, or sleeping too much    Feeling tired or having little energy    Poor appetite or overeating     Feeling bad about yourself - or that you are a failure or have let yourself or your family down     Trouble concentrating on things, such as reading  the newspaper or watching television    Moving or speaking so slowly that other people could have noticed.  Or the opposite - being so fidgety or restless that you have been moving around a lot more than usual    Thoughts that you would be better off dead, or hurting yourself in some way    PHQ2-9 Total Score    If you checked off any problems, how difficult have these problems made it for you to do your work, take care of things at home, or get along with other people    Depression Interventions/Treatment      Today's Vitals   02/23/24 1118  BP: 124/70   Pain Scale: 0-10 Pain Score: 6  Pain Type: Chronic pain Pain Location: Foot Pain Orientation: Left Pain Intervention(s): Medication (See eMAR)  Medications Reviewed Today     Reviewed by Nivia Illianna Paschal , RN (Registered Nurse) on 02/23/24 at 1106  Med List Status: <None>   Medication Order Taking? Sig Documenting Provider Last Dose Status Informant  acetaminophen  (TYLENOL ) 500 MG tablet [id number] Yes Take 2 tablets (1,000 mg total) by mouth every 6 (six) hours as needed. Clarine Ozell LABOR, MD  Active            Med Note [facility], DIANE P   Dju Jan 13, 2024  2:38 AM) PRN  amLODipine  (NORVASC ) 5 MG tablet [id number] Yes Take 1 tablet (5 mg total) by mouth daily. Verlinda Boas, PA-C  Active   CALCIUM  CITRATE PO [id number] Yes Take by mouth. [provider]  Active   Carboxymeth-Glyc-Polysorb PF 0.5-1-0.5 % SOLN [id number] Yes Apply 2 drops to eye 2 (two) times daily. [provider]  Active   cyclobenzaprine  (FLEXERIL ) 5 MG tablet [id number] Yes Take 1 tablet (5 mg total) by mouth daily as needed for muscle spasms. Emilio Kelly DASEN, FNP  Active            Med Note [facility], DIANE P   Dju Jan 13, 2024  2:39 AM) PRN, start date 10/21  enoxaparin  (LOVENOX ) 40 MG/0.4ML injection [id number]  Inject 0.4 mLs (40 mg total) into the skin daily for 14 days. Verlinda Boas, PA-C  Expired 02/01/24  2359   Ibuprofen  (MOTRIN  PO) [id number] Yes Take by mouth as needed (liquid). [provider]  Active   Levothyroxine  Sodium (TIROSINT ) 50 MCG CAPS [id number] Yes Take 75 mcg by mouth daily. [provider]  Active Self           Med Note DEBORA, CARYL-LYN M   Mon Apr 24, 2023  8:45 AM)    Multiple Vitamin (MULTI-VITAMINS) TABS [id number] Yes daily.  [provider]  Active            Med Note IMOGENE PENNE JINNY Sonjia Feb 16, 2016  9:34 PM)    Omega-3 Fatty Acids (FISH OIL) 1000 MG CAPS [id number] Yes Take by mouth. Takes occasionally due to causing acid reflux [provider]  Active   oxyCODONE  (OXY IR/ROXICODONE ) 5 MG immediate release tablet [id number] Yes Take 1-2 tablets (5-10 mg total) by mouth every 4 (four) hours as needed for moderate pain (pain score 4-6). Verlinda Boas, PA-C  Active   permethrin  (ELIMITE ) 5 % cream [id number] Yes Apply to the scalp, eyebrows, and eyelids, but not in the eye. Let sit overnight and wash off in the morning. Gasper Nancyann BRAVO, MD  Active   spironolactone  (ALDACTONE ) 25 MG tablet [id number] Yes Take 25 mg by mouth daily. [provider]  Active   triamcinolone  (NASACORT ) 55 MCG/ACT AERO nasal inhaler [id number] Yes Place 2 sprays into the nose as needed. Once daily as needed. [provider]  Active Self  UNABLE TO FIND [id number]  Take 1 capsule by mouth 2 (two) times daily. Med Name: [facility] [provider]  Active             Recommendation:   Patient has declined RN Care Management reporting the questions stress her.   Follow Up Plan:   Closing From:  Complex Care Management per patient request.    Porschia Willbanks RN RN Care Manager [facility] [phone]

## 2024-02-26 NOTE — Telephone Encounter (Signed)
 Diamond Lucas spoke with pt, pt is not able to drive due to knee concerns. Pt will attempt to reach out to Orthopedics regarding medication refill. If she not able to get in touch with them, pt will call back and schedule a virtual appt with any provider that is available.

## 2024-02-27 ENCOUNTER — Telehealth: Payer: Self-pay

## 2024-02-27 NOTE — Telephone Encounter (Signed)
 Copied from CRM [id number]. Topic: Clinical - Medical Advice >> Feb 26, 2024  4:34 PM Nathanel BROCKS wrote: Reason for CRM: physical therapist stated that pt missed appt last week on 11/22. She is just wanting to advise you of this.

## 2024-02-27 NOTE — Telephone Encounter (Signed)
 PT Telephone encounter reviewed.

## 2024-02-29 NOTE — Telephone Encounter (Signed)
 Noted

## 2024-03-26 DIAGNOSIS — E039 Hypothyroidism, unspecified: Secondary | ICD-10-CM | POA: Diagnosis not present

## 2024-03-26 DIAGNOSIS — S76101D Unspecified injury of right quadriceps muscle, fascia and tendon, subsequent encounter: Secondary | ICD-10-CM | POA: Diagnosis not present

## 2024-03-26 DIAGNOSIS — H04123 Dry eye syndrome of bilateral lacrimal glands: Secondary | ICD-10-CM | POA: Diagnosis not present

## 2024-03-26 DIAGNOSIS — I10 Essential (primary) hypertension: Secondary | ICD-10-CM | POA: Diagnosis not present

## 2024-03-26 DIAGNOSIS — F419 Anxiety disorder, unspecified: Secondary | ICD-10-CM | POA: Diagnosis not present

## 2024-03-26 DIAGNOSIS — Z4789 Encounter for other orthopedic aftercare: Secondary | ICD-10-CM | POA: Diagnosis not present

## 2024-03-26 DIAGNOSIS — M6281 Muscle weakness (generalized): Secondary | ICD-10-CM | POA: Diagnosis not present

## 2024-03-26 DIAGNOSIS — E7800 Pure hypercholesterolemia, unspecified: Secondary | ICD-10-CM | POA: Diagnosis not present

## 2024-03-26 DIAGNOSIS — I83819 Varicose veins of unspecified lower extremities with pain: Secondary | ICD-10-CM | POA: Diagnosis not present

## 2024-03-26 DIAGNOSIS — M5412 Radiculopathy, cervical region: Secondary | ICD-10-CM | POA: Diagnosis not present

## 2024-03-26 DIAGNOSIS — F209 Schizophrenia, unspecified: Secondary | ICD-10-CM | POA: Diagnosis not present

## 2024-03-26 DIAGNOSIS — F325 Major depressive disorder, single episode, in full remission: Secondary | ICD-10-CM | POA: Diagnosis not present

## 2024-03-27 ENCOUNTER — Telehealth: Payer: Self-pay

## 2024-03-27 NOTE — Telephone Encounter (Signed)
 Noted, I signed HH forms today.

## 2024-03-27 NOTE — Telephone Encounter (Signed)
 Copied from CRM #[id number]. Topic: General - Other >> Mar 27, 2024  9:44 AM Donna BRAVO wrote: Reason for CRM: [facility] will fax the order form that Dr Isaiah Pepper DO needs to sign.  Sonya has faxed the form again this morning.

## 2024-04-03 ENCOUNTER — Encounter: Payer: Self-pay | Admitting: Cardiology

## 2024-04-04 ENCOUNTER — Ambulatory Visit: Admitting: Obstetrics

## 2024-04-04 ENCOUNTER — Encounter: Payer: Self-pay | Admitting: Obstetrics

## 2024-04-04 VITALS — BP 131/84 | HR 80 | Ht 60.0 in | Wt 144.0 lb

## 2024-04-04 DIAGNOSIS — R319 Hematuria, unspecified: Secondary | ICD-10-CM

## 2024-04-04 DIAGNOSIS — R102 Pelvic and perineal pain unspecified side: Secondary | ICD-10-CM

## 2024-04-04 DIAGNOSIS — R1900 Intra-abdominal and pelvic swelling, mass and lump, unspecified site: Secondary | ICD-10-CM

## 2024-04-04 LAB — POCT URINALYSIS DIPSTICK
Bilirubin, UA: NEGATIVE
Glucose, UA: NEGATIVE
Ketones, UA: NEGATIVE
Leukocytes, UA: NEGATIVE
Nitrite, UA: NEGATIVE
Protein, UA: NEGATIVE
Spec Grav, UA: 1.01
Urobilinogen, UA: 0.2 U/dL
pH, UA: 5.5
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
## 2024-04-06 LAB — URINE CULTURE

## 2024-04-08 ENCOUNTER — Other Ambulatory Visit: Payer: Self-pay

## 2024-04-08 NOTE — Telephone Encounter (Unsigned)
 Copied from CRM [id number]. Topic: Clinical - Medication Refill >> Apr 08, 2024  9:51 AM China J wrote: Medication: amLODipine  (NORVASC ) 5 MG tablet  Has the patient contacted their pharmacy? Yes (Agent: If no, request that the patient contact the pharmacy for the refill. If patient does not wish to contact the pharmacy document the reason why and proceed with request.) (Agent: If yes, when and what did the pharmacy advise?)  This is the patient's preferred pharmacy:   [id number] [place], [place] - [address] [place] [address] Phone: [phone] Fax: [phone]  Is this the correct pharmacy for this prescription? Yes If no, delete pharmacy and type the correct one.   Has the prescription been filled recently? No  Is the patient out of the medication? No  Has the patient been seen for an appointment in the last year OR does the patient have an upcoming appointment? No  Can we respond through MyChart? Yes  Agent: Please be advised that Rx refills may take up to 3 business days. We ask that you follow-up with your pharmacy.

## 2024-04-09 ENCOUNTER — Other Ambulatory Visit: Payer: Self-pay

## 2024-04-09 DIAGNOSIS — I10 Essential (primary) hypertension: Secondary | ICD-10-CM

## 2024-04-09 MED ORDER — AMLODIPINE BESYLATE 5 MG PO TABS: 5.0000 mg | ORAL_TABLET | Freq: Every day | ORAL | 3 refills | Status: AC

## 2024-04-09 MED ORDER — AMLODIPINE BESYLATE 5 MG PO TABS: 5.0000 mg | ORAL_TABLET | Freq: Every day | ORAL | 0 refills | Status: AC

## 2024-04-09 NOTE — Telephone Encounter (Signed)
 Requested medication (s) are due for refill today: no  Requested medication (s) are on the active medication list: yes  Last refill:  04/09/24  Future visit scheduled: no  Notes to clinic:  Unable to refill per protocol, last refill by another provider. Duplicate request.     Requested Prescriptions  Pending Prescriptions Disp Refills   amLODipine  (NORVASC ) 5 MG tablet 30 tablet 0    Sig: Take 1 tablet (5 mg total) by mouth daily.     Cardiovascular: Calcium  Channel Blockers 2 Passed - 04/09/2024  1:56 PM      Passed - Last BP in normal range    BP Readings from Last 1 Encounters:  04/04/24 131/84         Passed - Last Heart Rate in normal range    Pulse Readings from Last 1 Encounters:  04/04/24 80         Passed - Valid encounter within last 6 months    Recent Outpatient Visits           3 months ago [employer]   [facility] [place] Isaiah LABOR, MD   4 months ago Primary hypertension   [place] [facility] Protection, [place], PA-C   8 months ago Lice infested hair   [place] [facility] [place], [place], MD   9 months ago Acute cough   [place] [facility] [place], [place], MD

## 2024-04-09 NOTE — Telephone Encounter (Signed)
 Refill sent

## 2024-04-09 NOTE — Telephone Encounter (Signed)
 Patient needs TOC appt scheduled for future refills. Please call and schedule appt.

## 2024-04-11 ENCOUNTER — Ambulatory Visit: Admitting: Obstetrics and Gynecology

## 2024-04-12 ENCOUNTER — Ambulatory Visit: Payer: Self-pay | Admitting: Obstetrics

## 2024-04-18 ENCOUNTER — Ambulatory Visit

## 2024-04-18 DIAGNOSIS — R1900 Intra-abdominal and pelvic swelling, mass and lump, unspecified site: Secondary | ICD-10-CM | POA: Diagnosis not present

## 2024-04-23 NOTE — Telephone Encounter (Signed)
 I recommend that patient discuss this with gastroenterology.

## 2024-04-26 DIAGNOSIS — E7800 Pure hypercholesterolemia, unspecified: Secondary | ICD-10-CM | POA: Diagnosis not present

## 2024-04-26 DIAGNOSIS — E039 Hypothyroidism, unspecified: Secondary | ICD-10-CM | POA: Diagnosis not present

## 2024-04-26 DIAGNOSIS — M1990 Unspecified osteoarthritis, unspecified site: Secondary | ICD-10-CM | POA: Diagnosis not present

## 2024-04-26 DIAGNOSIS — I10 Essential (primary) hypertension: Secondary | ICD-10-CM | POA: Diagnosis not present

## 2024-04-26 DIAGNOSIS — F419 Anxiety disorder, unspecified: Secondary | ICD-10-CM | POA: Diagnosis not present

## 2024-04-26 DIAGNOSIS — H04123 Dry eye syndrome of bilateral lacrimal glands: Secondary | ICD-10-CM | POA: Diagnosis not present

## 2024-04-26 DIAGNOSIS — M5412 Radiculopathy, cervical region: Secondary | ICD-10-CM | POA: Diagnosis not present

## 2024-04-26 DIAGNOSIS — F209 Schizophrenia, unspecified: Secondary | ICD-10-CM | POA: Diagnosis not present

## 2024-04-26 DIAGNOSIS — I83819 Varicose veins of unspecified lower extremities with pain: Secondary | ICD-10-CM | POA: Diagnosis not present

## 2024-04-26 DIAGNOSIS — S76191D Other specified injury of right quadriceps muscle, fascia and tendon, subsequent encounter: Secondary | ICD-10-CM | POA: Diagnosis not present

## 2024-04-26 DIAGNOSIS — M6281 Muscle weakness (generalized): Secondary | ICD-10-CM | POA: Diagnosis not present

## 2024-04-26 DIAGNOSIS — F325 Major depressive disorder, single episode, in full remission: Secondary | ICD-10-CM | POA: Diagnosis not present

## 2024-06-03 ENCOUNTER — Ambulatory Visit (INDEPENDENT_AMBULATORY_CARE_PROVIDER_SITE_OTHER): Admitting: Vascular Surgery

## 2024-06-05 ENCOUNTER — Ambulatory Visit (INDEPENDENT_AMBULATORY_CARE_PROVIDER_SITE_OTHER): Admitting: Vascular Surgery

## 2024-09-11 ENCOUNTER — Ambulatory Visit
